# Patient Record
Sex: Male | Born: 1940 | Race: White | Hispanic: No | Smoking: Former smoker
Health system: Southern US, Community
[De-identification: ages and names within clinical notes are randomized; demographics above are authoritative.]

## PROBLEM LIST (undated history)

## (undated) ENCOUNTER — Other Ambulatory Visit: Admission: RE | Payer: Medicare Other | Source: Ambulatory Visit | Admitting: *Deleted

## (undated) DIAGNOSIS — B1910 Unspecified viral hepatitis B without hepatic coma: Secondary | ICD-10-CM

---

## 2019-12-31 ENCOUNTER — Inpatient Hospital Stay (HOSPITAL_COMMUNITY): Payer: Medicare Other

## 2019-12-31 ENCOUNTER — Inpatient Hospital Stay (HOSPITAL_COMMUNITY)
Admission: AD | Admit: 2019-12-31 | Discharge: 2020-01-08 | DRG: 219 | Disposition: A | Discharge status: Rehabilitation Facility | Payer: Medicare Other | Source: Other Acute Inpatient Hospital | Attending: Cardiothoracic Surgery | Admitting: Cardiothoracic Surgery

## 2019-12-31 ENCOUNTER — Encounter
Admission: EM | Disposition: A | Discharge status: Other Acute Inpatient Hospital | Payer: Self-pay | Source: Home / Self Care | Attending: Family Medicine

## 2019-12-31 ENCOUNTER — Inpatient Hospital Stay (HOSPITAL_COMMUNITY): Payer: Medicare Other | Admitting: Certified Registered Nurse Anesthetist

## 2019-12-31 ENCOUNTER — Other Ambulatory Visit: Payer: Self-pay

## 2019-12-31 ENCOUNTER — Inpatient Hospital Stay: Payer: Medicare Other

## 2019-12-31 ENCOUNTER — Inpatient Hospital Stay
Admission: EM | Admit: 2019-12-31 | Discharge: 2019-12-31 | DRG: 272 | Disposition: A | Discharge status: Other Acute Inpatient Hospital | Payer: Medicare Other | Attending: Family Medicine | Admitting: Family Medicine

## 2019-12-31 ENCOUNTER — Encounter (HOSPITAL_COMMUNITY): Payer: Self-pay | Admitting: Cardiology

## 2019-12-31 ENCOUNTER — Encounter (HOSPITAL_COMMUNITY)
Admission: AD | Disposition: A | Discharge status: Rehabilitation Facility | Payer: Self-pay | Attending: Cardiothoracic Surgery

## 2019-12-31 DIAGNOSIS — K5900 Constipation, unspecified: Secondary | ICD-10-CM | POA: Diagnosis present

## 2019-12-31 DIAGNOSIS — R0602 Shortness of breath: Secondary | ICD-10-CM | POA: Diagnosis not present

## 2019-12-31 DIAGNOSIS — I5022 Chronic systolic (congestive) heart failure: Secondary | ICD-10-CM | POA: Diagnosis not present

## 2019-12-31 DIAGNOSIS — R1310 Dysphagia, unspecified: Secondary | ICD-10-CM | POA: Diagnosis present

## 2019-12-31 DIAGNOSIS — I480 Paroxysmal atrial fibrillation: Secondary | ICD-10-CM | POA: Diagnosis present

## 2019-12-31 DIAGNOSIS — Z8249 Family history of ischemic heart disease and other diseases of the circulatory system: Secondary | ICD-10-CM | POA: Diagnosis not present

## 2019-12-31 DIAGNOSIS — I2101 ST elevation (STEMI) myocardial infarction involving left main coronary artery: Secondary | ICD-10-CM

## 2019-12-31 DIAGNOSIS — I959 Hypotension, unspecified: Secondary | ICD-10-CM | POA: Diagnosis present

## 2019-12-31 DIAGNOSIS — B1910 Unspecified viral hepatitis B without hepatic coma: Secondary | ICD-10-CM | POA: Diagnosis present

## 2019-12-31 DIAGNOSIS — E785 Hyperlipidemia, unspecified: Secondary | ICD-10-CM | POA: Diagnosis present

## 2019-12-31 DIAGNOSIS — E870 Hyperosmolality and hypernatremia: Secondary | ICD-10-CM | POA: Diagnosis not present

## 2019-12-31 DIAGNOSIS — N179 Acute kidney failure, unspecified: Secondary | ICD-10-CM | POA: Diagnosis present

## 2019-12-31 DIAGNOSIS — I2102 ST elevation (STEMI) myocardial infarction involving left anterior descending coronary artery: Secondary | ICD-10-CM | POA: Diagnosis present

## 2019-12-31 DIAGNOSIS — J9601 Acute respiratory failure with hypoxia: Secondary | ICD-10-CM | POA: Diagnosis present

## 2019-12-31 DIAGNOSIS — I5021 Acute systolic (congestive) heart failure: Secondary | ICD-10-CM | POA: Diagnosis present

## 2019-12-31 DIAGNOSIS — D6959 Other secondary thrombocytopenia: Secondary | ICD-10-CM | POA: Diagnosis not present

## 2019-12-31 DIAGNOSIS — Z20822 Contact with and (suspected) exposure to covid-19: Secondary | ICD-10-CM | POA: Diagnosis present

## 2019-12-31 DIAGNOSIS — J189 Pneumonia, unspecified organism: Secondary | ICD-10-CM | POA: Diagnosis not present

## 2019-12-31 DIAGNOSIS — I350 Nonrheumatic aortic (valve) stenosis: Secondary | ICD-10-CM | POA: Diagnosis present

## 2019-12-31 DIAGNOSIS — Z452 Encounter for adjustment and management of vascular access device: Secondary | ICD-10-CM

## 2019-12-31 DIAGNOSIS — Z01810 Encounter for preprocedural cardiovascular examination: Secondary | ICD-10-CM | POA: Diagnosis not present

## 2019-12-31 DIAGNOSIS — I340 Nonrheumatic mitral (valve) insufficiency: Secondary | ICD-10-CM

## 2019-12-31 DIAGNOSIS — Z952 Presence of prosthetic heart valve: Secondary | ICD-10-CM

## 2019-12-31 DIAGNOSIS — Z9889 Other specified postprocedural states: Secondary | ICD-10-CM

## 2019-12-31 DIAGNOSIS — I4891 Unspecified atrial fibrillation: Secondary | ICD-10-CM | POA: Diagnosis present

## 2019-12-31 DIAGNOSIS — D62 Acute posthemorrhagic anemia: Secondary | ICD-10-CM | POA: Diagnosis not present

## 2019-12-31 DIAGNOSIS — D649 Anemia, unspecified: Secondary | ICD-10-CM | POA: Diagnosis not present

## 2019-12-31 DIAGNOSIS — D72829 Elevated white blood cell count, unspecified: Secondary | ICD-10-CM | POA: Diagnosis not present

## 2019-12-31 DIAGNOSIS — N289 Disorder of kidney and ureter, unspecified: Secondary | ICD-10-CM | POA: Diagnosis present

## 2019-12-31 DIAGNOSIS — R64 Cachexia: Secondary | ICD-10-CM | POA: Diagnosis not present

## 2019-12-31 DIAGNOSIS — I5043 Acute on chronic combined systolic (congestive) and diastolic (congestive) heart failure: Secondary | ICD-10-CM | POA: Diagnosis not present

## 2019-12-31 DIAGNOSIS — I213 ST elevation (STEMI) myocardial infarction of unspecified site: Secondary | ICD-10-CM | POA: Diagnosis not present

## 2019-12-31 DIAGNOSIS — F22 Delusional disorders: Secondary | ICD-10-CM | POA: Diagnosis present

## 2019-12-31 DIAGNOSIS — I2510 Atherosclerotic heart disease of native coronary artery without angina pectoris: Secondary | ICD-10-CM | POA: Diagnosis present

## 2019-12-31 DIAGNOSIS — Z951 Presence of aortocoronary bypass graft: Secondary | ICD-10-CM | POA: Diagnosis not present

## 2019-12-31 DIAGNOSIS — I472 Ventricular tachycardia: Secondary | ICD-10-CM | POA: Diagnosis not present

## 2019-12-31 DIAGNOSIS — R5381 Other malaise: Secondary | ICD-10-CM | POA: Diagnosis not present

## 2019-12-31 DIAGNOSIS — R570 Cardiogenic shock: Secondary | ICD-10-CM | POA: Diagnosis present

## 2019-12-31 DIAGNOSIS — Z7982 Long term (current) use of aspirin: Secondary | ICD-10-CM

## 2019-12-31 DIAGNOSIS — I313 Pericardial effusion (noninflammatory): Secondary | ICD-10-CM | POA: Diagnosis present

## 2019-12-31 DIAGNOSIS — E162 Hypoglycemia, unspecified: Secondary | ICD-10-CM | POA: Diagnosis present

## 2019-12-31 DIAGNOSIS — Z681 Body mass index (BMI) 19 or less, adult: Secondary | ICD-10-CM | POA: Diagnosis not present

## 2019-12-31 DIAGNOSIS — I509 Heart failure, unspecified: Secondary | ICD-10-CM

## 2019-12-31 DIAGNOSIS — Z79899 Other long term (current) drug therapy: Secondary | ICD-10-CM | POA: Diagnosis not present

## 2019-12-31 DIAGNOSIS — Z9689 Presence of other specified functional implants: Secondary | ICD-10-CM

## 2019-12-31 DIAGNOSIS — I5023 Acute on chronic systolic (congestive) heart failure: Secondary | ICD-10-CM | POA: Diagnosis not present

## 2019-12-31 HISTORY — PX: CORONARY ARTERY BYPASS GRAFT: SHX141

## 2019-12-31 HISTORY — DX: Unspecified viral hepatitis B without hepatic coma: B19.10

## 2019-12-31 HISTORY — PX: CLIPPING OF ATRIAL APPENDAGE: SHX5773

## 2019-12-31 HISTORY — PX: AORTIC VALVE REPLACEMENT: SHX41

## 2019-12-31 HISTORY — PX: CORONARY/GRAFT ACUTE MI REVASCULARIZATION: CATH118305

## 2019-12-31 HISTORY — PX: MAZE: SHX5063

## 2019-12-31 HISTORY — PX: TEE WITHOUT CARDIOVERSION: SHX5443

## 2019-12-31 HISTORY — PX: LEFT HEART CATH AND CORONARY ANGIOGRAPHY: CATH118249

## 2019-12-31 LAB — POCT I-STAT 7, (LYTES, BLD GAS, ICA,H+H)
Acid-base deficit: 2 mmol/L (ref 0.0–2.0)
Acid-base deficit: 4 mmol/L — ABNORMAL HIGH (ref 0.0–2.0)
Acid-base deficit: 7 mmol/L — ABNORMAL HIGH (ref 0.0–2.0)
Acid-base deficit: 9 mmol/L — ABNORMAL HIGH (ref 0.0–2.0)
Acid-base deficit: 7 mmol/L — ABNORMAL HIGH (ref 0.0–2.0)
Acid-base deficit: 9 mmol/L — ABNORMAL HIGH (ref 0.0–2.0)
Acid-base deficit: 9 mmol/L — ABNORMAL HIGH (ref 0.0–2.0)
Acid-base deficit: 7 mmol/L — ABNORMAL HIGH (ref 0.0–2.0)
Bicarbonate: 21.9 mmol/L (ref 20.0–28.0)
Bicarbonate: 22.3 mmol/L (ref 20.0–28.0)
Bicarbonate: 19.4 mmol/L — ABNORMAL LOW (ref 20.0–28.0)
Bicarbonate: 18.8 mmol/L — ABNORMAL LOW (ref 20.0–28.0)
Bicarbonate: 18.4 mmol/L — ABNORMAL LOW (ref 20.0–28.0)
Bicarbonate: 20.5 mmol/L (ref 20.0–28.0)
Bicarbonate: 17.2 mmol/L — ABNORMAL LOW (ref 20.0–28.0)
Bicarbonate: 18.7 mmol/L — ABNORMAL LOW (ref 20.0–28.0)
Calcium, Ion: 1.16 mmol/L (ref 1.15–1.40)
Calcium, Ion: 1.01 mmol/L — ABNORMAL LOW (ref 1.15–1.40)
Calcium, Ion: 0.67 mmol/L — CL (ref 1.15–1.40)
Calcium, Ion: 0.9 mmol/L — ABNORMAL LOW (ref 1.15–1.40)
Calcium, Ion: 1.31 mmol/L (ref 1.15–1.40)
Calcium, Ion: 1.45 mmol/L — ABNORMAL HIGH (ref 1.15–1.40)
Calcium, Ion: 1.21 mmol/L (ref 1.15–1.40)
Calcium, Ion: 1.26 mmol/L (ref 1.15–1.40)
HCT: 35 % — ABNORMAL LOW (ref 39.0–52.0)
HCT: 22 % — ABNORMAL LOW (ref 39.0–52.0)
HCT: 31 % — ABNORMAL LOW (ref 39.0–52.0)
HCT: 29 % — ABNORMAL LOW (ref 39.0–52.0)
HCT: 30 % — ABNORMAL LOW (ref 39.0–52.0)
HCT: 30 % — ABNORMAL LOW (ref 39.0–52.0)
HCT: 27 % — ABNORMAL LOW (ref 39.0–52.0)
HCT: 26 % — ABNORMAL LOW (ref 39.0–52.0)
Hemoglobin: 11.9 g/dL — ABNORMAL LOW (ref 13.0–17.0)
Hemoglobin: 7.5 g/dL — ABNORMAL LOW (ref 13.0–17.0)
Hemoglobin: 10.5 g/dL — ABNORMAL LOW (ref 13.0–17.0)
Hemoglobin: 9.9 g/dL — ABNORMAL LOW (ref 13.0–17.0)
Hemoglobin: 10.2 g/dL — ABNORMAL LOW (ref 13.0–17.0)
Hemoglobin: 10.2 g/dL — ABNORMAL LOW (ref 13.0–17.0)
Hemoglobin: 9.2 g/dL — ABNORMAL LOW (ref 13.0–17.0)
Hemoglobin: 8.8 g/dL — ABNORMAL LOW (ref 13.0–17.0)
O2 Saturation: 94 %
O2 Saturation: 100 %
O2 Saturation: 99 %
O2 Saturation: 95 %
O2 Saturation: 86 %
O2 Saturation: 90 %
O2 Saturation: 92 %
O2 Saturation: 95 %
Patient temperature: 98.2
Patient temperature: 36.5
Patient temperature: 36.7
Patient temperature: 35.7
Patient temperature: 35.8
Potassium: 4 mmol/L (ref 3.5–5.1)
Potassium: 4.7 mmol/L (ref 3.5–5.1)
Potassium: 5.5 mmol/L — ABNORMAL HIGH (ref 3.5–5.1)
Potassium: 4.3 mmol/L (ref 3.5–5.1)
Potassium: 3.9 mmol/L (ref 3.5–5.1)
Potassium: 3.9 mmol/L (ref 3.5–5.1)
Potassium: 3.6 mmol/L (ref 3.5–5.1)
Potassium: 3.1 mmol/L — ABNORMAL LOW (ref 3.5–5.1)
Sodium: 141 mmol/L (ref 135–145)
Sodium: 140 mmol/L (ref 135–145)
Sodium: 143 mmol/L (ref 135–145)
Sodium: 145 mmol/L (ref 135–145)
Sodium: 144 mmol/L (ref 135–145)
Sodium: 145 mmol/L (ref 135–145)
Sodium: 146 mmol/L — ABNORMAL HIGH (ref 135–145)
Sodium: 148 mmol/L — ABNORMAL HIGH (ref 135–145)
TCO2: 23 mmol/L (ref 22–32)
TCO2: 24 mmol/L (ref 22–32)
TCO2: 21 mmol/L — ABNORMAL LOW (ref 22–32)
TCO2: 20 mmol/L — ABNORMAL LOW (ref 22–32)
TCO2: 20 mmol/L — ABNORMAL LOW (ref 22–32)
TCO2: 22 mmol/L (ref 22–32)
TCO2: 18 mmol/L — ABNORMAL LOW (ref 22–32)
TCO2: 20 mmol/L — ABNORMAL LOW (ref 22–32)
pCO2 arterial: 33.1 mmHg (ref 32.0–48.0)
pCO2 arterial: 46.2 mmHg (ref 32.0–48.0)
pCO2 arterial: 41.6 mmHg (ref 32.0–48.0)
pCO2 arterial: 46.7 mmHg (ref 32.0–48.0)
pCO2 arterial: 46.2 mmHg (ref 32.0–48.0)
pCO2 arterial: 48.2 mmHg — ABNORMAL HIGH (ref 32.0–48.0)
pCO2 arterial: 33.9 mmHg (ref 32.0–48.0)
pCO2 arterial: 35.6 mmHg (ref 32.0–48.0)
pH, Arterial: 7.428 (ref 7.350–7.450)
pH, Arterial: 7.292 — ABNORMAL LOW (ref 7.350–7.450)
pH, Arterial: 7.276 — ABNORMAL LOW (ref 7.350–7.450)
pH, Arterial: 7.212 — ABNORMAL LOW (ref 7.350–7.450)
pH, Arterial: 7.206 — ABNORMAL LOW (ref 7.350–7.450)
pH, Arterial: 7.234 — ABNORMAL LOW (ref 7.350–7.450)
pH, Arterial: 7.306 — ABNORMAL LOW (ref 7.350–7.450)
pH, Arterial: 7.322 — ABNORMAL LOW (ref 7.350–7.450)
pO2, Arterial: 69 mmHg — ABNORMAL LOW (ref 83.0–108.0)
pO2, Arterial: 407 mmHg — ABNORMAL HIGH (ref 83.0–108.0)
pO2, Arterial: 139 mmHg — ABNORMAL HIGH (ref 83.0–108.0)
pO2, Arterial: 90 mmHg (ref 83.0–108.0)
pO2, Arterial: 59 mmHg — ABNORMAL LOW (ref 83.0–108.0)
pO2, Arterial: 70 mmHg — ABNORMAL LOW (ref 83.0–108.0)
pO2, Arterial: 65 mmHg — ABNORMAL LOW (ref 83.0–108.0)
pO2, Arterial: 77 mmHg — ABNORMAL LOW (ref 83.0–108.0)

## 2019-12-31 LAB — CBC WITH DIFFERENTIAL/PLATELET
Abs Immature Granulocytes: 0.05 10*3/uL (ref 0.00–0.07)
Abs Immature Granulocytes: 0.11 10*3/uL — ABNORMAL HIGH (ref 0.00–0.07)
Basophils Absolute: 0.1 10*3/uL (ref 0.0–0.1)
Basophils Absolute: 0.2 10*3/uL — ABNORMAL HIGH (ref 0.0–0.1)
Basophils Relative: 1 %
Basophils Relative: 1 %
Eosinophils Absolute: 0 10*3/uL (ref 0.0–0.5)
Eosinophils Absolute: 0.3 10*3/uL (ref 0.0–0.5)
Eosinophils Relative: 0 %
Eosinophils Relative: 2 %
HCT: 36.2 % — ABNORMAL LOW (ref 39.0–52.0)
HCT: 40.2 % (ref 39.0–52.0)
Hemoglobin: 12.3 g/dL — ABNORMAL LOW (ref 13.0–17.0)
Hemoglobin: 13.6 g/dL (ref 13.0–17.0)
Immature Granulocytes: 0 %
Immature Granulocytes: 1 %
Lymphocytes Relative: 29 %
Lymphocytes Relative: 6 %
Lymphs Abs: 1.1 10*3/uL (ref 0.7–4.0)
Lymphs Abs: 4.9 10*3/uL — ABNORMAL HIGH (ref 0.7–4.0)
MCH: 35.6 pg — ABNORMAL HIGH (ref 26.0–34.0)
MCH: 35.7 pg — ABNORMAL HIGH (ref 26.0–34.0)
MCHC: 33.8 g/dL (ref 30.0–36.0)
MCHC: 34 g/dL (ref 30.0–36.0)
MCV: 104.9 fL — ABNORMAL HIGH (ref 80.0–100.0)
MCV: 105.2 fL — ABNORMAL HIGH (ref 80.0–100.0)
Monocytes Absolute: 1.6 10*3/uL — ABNORMAL HIGH (ref 0.1–1.0)
Monocytes Absolute: 1.8 10*3/uL — ABNORMAL HIGH (ref 0.1–1.0)
Monocytes Relative: 11 %
Monocytes Relative: 8 %
Neutro Abs: 10 10*3/uL — ABNORMAL HIGH (ref 1.7–7.7)
Neutro Abs: 17.1 10*3/uL — ABNORMAL HIGH (ref 1.7–7.7)
Neutrophils Relative %: 57 %
Neutrophils Relative %: 84 %
Platelets: 233 10*3/uL (ref 150–400)
Platelets: 274 10*3/uL (ref 150–400)
RBC: 3.45 MIL/uL — ABNORMAL LOW (ref 4.22–5.81)
RBC: 3.82 MIL/uL — ABNORMAL LOW (ref 4.22–5.81)
RDW: 19.2 % — ABNORMAL HIGH (ref 11.5–15.5)
RDW: 19.4 % — ABNORMAL HIGH (ref 11.5–15.5)
WBC: 17.2 10*3/uL — ABNORMAL HIGH (ref 4.0–10.5)
WBC: 20 10*3/uL — ABNORMAL HIGH (ref 4.0–10.5)
nRBC: 0.3 % — ABNORMAL HIGH (ref 0.0–0.2)
nRBC: 0.4 % — ABNORMAL HIGH (ref 0.0–0.2)

## 2019-12-31 LAB — COMPREHENSIVE METABOLIC PANEL
ALT: 28 U/L (ref 0–44)
AST: 41 U/L (ref 15–41)
Albumin: 3.9 g/dL (ref 3.5–5.0)
Alkaline Phosphatase: 65 U/L (ref 38–126)
Anion gap: 14 (ref 5–15)
BUN: 29 mg/dL — ABNORMAL HIGH (ref 8–23)
CO2: 22 mmol/L (ref 22–32)
Calcium: 9.1 mg/dL (ref 8.9–10.3)
Chloride: 103 mmol/L (ref 98–111)
Creatinine, Ser: 1.18 mg/dL (ref 0.61–1.24)
GFR calc Af Amer: >60 mL/min (ref >60)
GFR calc non Af Amer: 58 mL/min — ABNORMAL LOW (ref >60)
Glucose, Bld: 231 mg/dL — ABNORMAL HIGH (ref 70–99)
Potassium: 4.2 mmol/L (ref 3.5–5.1)
Sodium: 139 mmol/L (ref 135–145)
Total Bilirubin: 2 mg/dL — ABNORMAL HIGH (ref 0.3–1.2)
Total Protein: 7.7 g/dL (ref 6.5–8.1)

## 2019-12-31 LAB — POCT I-STAT, CHEM 8
BUN: 25 mg/dL — ABNORMAL HIGH (ref 8–23)
BUN: 21 mg/dL (ref 8–23)
BUN: 21 mg/dL (ref 8–23)
BUN: 20 mg/dL (ref 8–23)
BUN: 20 mg/dL (ref 8–23)
Calcium, Ion: 1.15 mmol/L (ref 1.15–1.40)
Calcium, Ion: 1.08 mmol/L — ABNORMAL LOW (ref 1.15–1.40)
Calcium, Ion: 1.03 mmol/L — ABNORMAL LOW (ref 1.15–1.40)
Calcium, Ion: 1.01 mmol/L — ABNORMAL LOW (ref 1.15–1.40)
Calcium, Ion: 0.66 mmol/L — CL (ref 1.15–1.40)
Chloride: 107 mmol/L (ref 98–111)
Chloride: 109 mmol/L (ref 98–111)
Chloride: 107 mmol/L (ref 98–111)
Chloride: 107 mmol/L (ref 98–111)
Chloride: 107 mmol/L (ref 98–111)
Creatinine, Ser: 1 mg/dL (ref 0.61–1.24)
Creatinine, Ser: 0.8 mg/dL (ref 0.61–1.24)
Creatinine, Ser: 0.8 mg/dL (ref 0.61–1.24)
Creatinine, Ser: 0.7 mg/dL (ref 0.61–1.24)
Creatinine, Ser: 0.8 mg/dL (ref 0.61–1.24)
Glucose, Bld: 130 mg/dL — ABNORMAL HIGH (ref 70–99)
Glucose, Bld: 158 mg/dL — ABNORMAL HIGH (ref 70–99)
Glucose, Bld: 148 mg/dL — ABNORMAL HIGH (ref 70–99)
Glucose, Bld: 157 mg/dL — ABNORMAL HIGH (ref 70–99)
Glucose, Bld: 164 mg/dL — ABNORMAL HIGH (ref 70–99)
HCT: 30 % — ABNORMAL LOW (ref 39.0–52.0)
HCT: 26 % — ABNORMAL LOW (ref 39.0–52.0)
HCT: 21 % — ABNORMAL LOW (ref 39.0–52.0)
HCT: 22 % — ABNORMAL LOW (ref 39.0–52.0)
HCT: 29 % — ABNORMAL LOW (ref 39.0–52.0)
Hemoglobin: 10.2 g/dL — ABNORMAL LOW (ref 13.0–17.0)
Hemoglobin: 8.8 g/dL — ABNORMAL LOW (ref 13.0–17.0)
Hemoglobin: 7.1 g/dL — ABNORMAL LOW (ref 13.0–17.0)
Hemoglobin: 7.5 g/dL — ABNORMAL LOW (ref 13.0–17.0)
Hemoglobin: 9.9 g/dL — ABNORMAL LOW (ref 13.0–17.0)
Potassium: 4.1 mmol/L (ref 3.5–5.1)
Potassium: 4.2 mmol/L (ref 3.5–5.1)
Potassium: 5.7 mmol/L — ABNORMAL HIGH (ref 3.5–5.1)
Potassium: 6.2 mmol/L — ABNORMAL HIGH (ref 3.5–5.1)
Potassium: 5.4 mmol/L — ABNORMAL HIGH (ref 3.5–5.1)
Sodium: 141 mmol/L (ref 135–145)
Sodium: 140 mmol/L (ref 135–145)
Sodium: 138 mmol/L (ref 135–145)
Sodium: 141 mmol/L (ref 135–145)
Sodium: 141 mmol/L (ref 135–145)
TCO2: 25 mmol/L (ref 22–32)
TCO2: 23 mmol/L (ref 22–32)
TCO2: 25 mmol/L (ref 22–32)
TCO2: 25 mmol/L (ref 22–32)
TCO2: 22 mmol/L (ref 22–32)

## 2019-12-31 LAB — HEMOGLOBIN AND HEMATOCRIT, BLOOD
HCT: 21.4 % — ABNORMAL LOW (ref 39.0–52.0)
Hemoglobin: 7.4 g/dL — ABNORMAL LOW (ref 13.0–17.0)

## 2019-12-31 LAB — URINALYSIS, ROUTINE W REFLEX MICROSCOPIC
Bilirubin Urine: NEGATIVE
Glucose, UA: NEGATIVE mg/dL
Hgb urine dipstick: NEGATIVE
Ketones, ur: 5 mg/dL — AB
Leukocytes,Ua: NEGATIVE
Nitrite: NEGATIVE
Protein, ur: NEGATIVE mg/dL
Specific Gravity, Urine: 1.043 — ABNORMAL HIGH (ref 1.005–1.030)
pH: 5 (ref 5.0–8.0)

## 2019-12-31 LAB — BASIC METABOLIC PANEL
Anion gap: 9 (ref 5–15)
BUN: 27 mg/dL — ABNORMAL HIGH (ref 8–23)
CO2: 23 mmol/L (ref 22–32)
Calcium: 8.5 mg/dL — ABNORMAL LOW (ref 8.9–10.3)
Chloride: 106 mmol/L (ref 98–111)
Creatinine, Ser: 1.13 mg/dL (ref 0.61–1.24)
GFR calc Af Amer: >60 mL/min (ref >60)
GFR calc non Af Amer: >60 mL/min (ref >60)
Glucose, Bld: 190 mg/dL — ABNORMAL HIGH (ref 70–99)
Potassium: 4 mmol/L (ref 3.5–5.1)
Sodium: 138 mmol/L (ref 135–145)

## 2019-12-31 LAB — LIPID PANEL
Cholesterol: 275 mg/dL — ABNORMAL HIGH (ref 0–200)
HDL: 75 mg/dL (ref >40)
LDL Cholesterol: 187 mg/dL — ABNORMAL HIGH (ref 0–99)
Total CHOL/HDL Ratio: 3.7 RATIO
Triglycerides: 65 mg/dL (ref <150)
VLDL: 13 mg/dL (ref 0–40)

## 2019-12-31 LAB — COOXEMETRY PANEL
Carboxyhemoglobin: 1 % (ref 0.5–1.5)
Methemoglobin: 0.6 % (ref 0.0–1.5)
O2 Saturation: 36.2 %
Total hemoglobin: 11.6 g/dL — ABNORMAL LOW (ref 12.0–16.0)

## 2019-12-31 LAB — ABO/RH: ABO/RH(D): O NEG

## 2019-12-31 LAB — CBC
HCT: 29.4 % — ABNORMAL LOW (ref 39.0–52.0)
Hemoglobin: 10 g/dL — ABNORMAL LOW (ref 13.0–17.0)
MCH: 34.8 pg — ABNORMAL HIGH (ref 26.0–34.0)
MCHC: 34 g/dL (ref 30.0–36.0)
MCV: 102.4 fL — ABNORMAL HIGH (ref 80.0–100.0)
Platelets: 78 10*3/uL — ABNORMAL LOW (ref 150–400)
RBC: 2.87 MIL/uL — ABNORMAL LOW (ref 4.22–5.81)
RDW: 18.9 % — ABNORMAL HIGH (ref 11.5–15.5)
WBC: 22.3 10*3/uL — ABNORMAL HIGH (ref 4.0–10.5)
nRBC: 0.8 % — ABNORMAL HIGH (ref 0.0–0.2)

## 2019-12-31 LAB — GLUCOSE, CAPILLARY
Glucose-Capillary: 170 mg/dL — ABNORMAL HIGH (ref 70–99)
Glucose-Capillary: 191 mg/dL — ABNORMAL HIGH (ref 70–99)
Glucose-Capillary: 140 mg/dL — ABNORMAL HIGH (ref 70–99)
Glucose-Capillary: 167 mg/dL — ABNORMAL HIGH (ref 70–99)
Glucose-Capillary: 199 mg/dL — ABNORMAL HIGH (ref 70–99)

## 2019-12-31 LAB — PREPARE RBC (CROSSMATCH)

## 2019-12-31 LAB — HEMOGLOBIN A1C
Hgb A1c MFr Bld: 5.2 % (ref 4.8–5.6)
Mean Plasma Glucose: 102.54 mg/dL

## 2019-12-31 LAB — TROPONIN I (HIGH SENSITIVITY)
Troponin I (High Sensitivity): 145 ng/L (ref <18)
Troponin I (High Sensitivity): 8098 ng/L (ref <18)
Troponin I (High Sensitivity): >27000 ng/L (ref <18)

## 2019-12-31 LAB — PROTIME-INR
INR: 1.2 (ref 0.8–1.2)
INR: 2.2 — ABNORMAL HIGH (ref 0.8–1.2)
Prothrombin Time: 14.8 seconds (ref 11.4–15.2)
Prothrombin Time: 24.4 seconds — ABNORMAL HIGH (ref 11.4–15.2)

## 2019-12-31 LAB — PLATELET COUNT: Platelets: 87 10*3/uL — ABNORMAL LOW (ref 150–400)

## 2019-12-31 LAB — ECHOCARDIOGRAM COMPLETE
Height: 67 in
Weight: 1920 oz

## 2019-12-31 LAB — RESPIRATORY PANEL BY RT PCR (FLU A&B, COVID)
Influenza A by PCR: NEGATIVE
Influenza B by PCR: NEGATIVE
SARS Coronavirus 2 by RT PCR: NEGATIVE

## 2019-12-31 LAB — APTT
aPTT: 29 seconds (ref 24–36)
aPTT: 65 seconds — ABNORMAL HIGH (ref 24–36)

## 2019-12-31 LAB — MRSA PCR SCREENING: MRSA by PCR: NEGATIVE

## 2019-12-31 LAB — MAGNESIUM: Magnesium: 2 mg/dL (ref 1.7–2.4)

## 2019-12-31 LAB — SURGICAL PCR SCREEN
MRSA, PCR: NEGATIVE
Staphylococcus aureus: NEGATIVE

## 2019-12-31 LAB — TSH: TSH: 2.539 u[IU]/mL (ref 0.350–4.500)

## 2019-12-31 SURGERY — CORONARY/GRAFT ACUTE MI REVASCULARIZATION
Anesthesia: Moderate Sedation

## 2019-12-31 SURGERY — CORONARY ARTERY BYPASS GRAFTING (CABG)
Anesthesia: General | Site: Chest

## 2019-12-31 MED ORDER — AMIODARONE HCL IN DEXTROSE 360-4.14 MG/200ML-% IV SOLN: 30.0000 mg/h | INTRAVENOUS | Status: DC

## 2019-12-31 MED ORDER — BUPIVACAINE LIPOSOME 1.3 % IJ SUSP: INTRAMUSCULAR | Status: DC | PRN

## 2019-12-31 MED ORDER — PHENYLEPHRINE 40 MCG/ML (10ML) SYRINGE FOR IV PUSH (FOR BLOOD PRESSURE SUPPORT)
PREFILLED_SYRINGE | INTRAVENOUS | Status: AC
  Filled 2019-12-31: qty 10

## 2019-12-31 MED ORDER — AMIODARONE HCL IN DEXTROSE 360-4.14 MG/200ML-% IV SOLN: 60.0000 mg/h | INTRAVENOUS | Status: DC

## 2019-12-31 MED ORDER — IOHEXOL 300 MG/ML  SOLN
INTRAMUSCULAR | Status: DC | PRN
  Administered 2019-12-31: 40 mL

## 2019-12-31 MED ORDER — ONDANSETRON HCL 4 MG/2ML IJ SOLN
4.0000 mg | Freq: Four times a day (QID) | INTRAMUSCULAR | Status: DC | PRN
  Administered 2020-01-01: 4 mg via INTRAVENOUS
  Filled 2019-12-31: qty 2

## 2019-12-31 MED ORDER — VASOPRESSIN 20 UNIT/ML IV SOLN
INTRAVENOUS | Status: DC | PRN
  Administered 2019-12-31 (×3): 1 m[IU] via INTRAVENOUS

## 2019-12-31 MED ORDER — ASPIRIN 81 MG PO CHEW: 324.0000 mg | CHEWABLE_TABLET | ORAL | Status: DC

## 2019-12-31 MED ORDER — NITROGLYCERIN 0.4 MG SL SUBL: 0.4000 mg | SUBLINGUAL_TABLET | SUBLINGUAL | Status: DC | PRN

## 2019-12-31 MED ORDER — SODIUM CHLORIDE 0.9 % IV SOLN: 250.0000 mL | INTRAVENOUS | Status: DC

## 2019-12-31 MED ORDER — DOCUSATE SODIUM 100 MG PO CAPS
200.0000 mg | ORAL_CAPSULE | Freq: Every day | ORAL | Status: DC
  Filled 2019-12-31: qty 2

## 2019-12-31 MED ORDER — ACETAMINOPHEN 500 MG PO TABS
1000.0000 mg | ORAL_TABLET | Freq: Four times a day (QID) | ORAL | Status: DC
  Administered 2020-01-02 – 2020-01-03 (×2): 1000 mg via ORAL
  Filled 2019-12-31 (×4): qty 2

## 2019-12-31 MED ORDER — PROTAMINE SULFATE 10 MG/ML IV SOLN
INTRAVENOUS | Status: AC
  Filled 2019-12-31: qty 25

## 2019-12-31 MED ORDER — HEMOSTATIC AGENTS (NO CHARGE) OPTIME
TOPICAL | Status: DC | PRN
  Administered 2019-12-31: 2 via TOPICAL

## 2019-12-31 MED ORDER — LABETALOL HCL 5 MG/ML IV SOLN: 10.0000 mg | INTRAVENOUS | Status: DC | PRN

## 2019-12-31 MED ORDER — DEXTROSE 50 % IV SOLN: 0.0000 mL | INTRAVENOUS | Status: DC | PRN

## 2019-12-31 MED ORDER — TRANEXAMIC ACID 1000 MG/10ML IV SOLN
1.5000 mg/kg/h | INTRAVENOUS | Status: AC
  Administered 2019-12-31: 1.5 mg/kg/h via INTRAVENOUS
  Filled 2019-12-31: qty 25

## 2019-12-31 MED ORDER — CHLORHEXIDINE GLUCONATE CLOTH 2 % EX PADS: 6.0000 | MEDICATED_PAD | Freq: Every day | CUTANEOUS | Status: DC

## 2019-12-31 MED ORDER — NITROGLYCERIN 1 MG/10 ML FOR IR/CATH LAB
INTRA_ARTERIAL | Status: AC
  Filled 2019-12-31: qty 10

## 2019-12-31 MED ORDER — FENTANYL CITRATE (PF) 250 MCG/5ML IJ SOLN
INTRAMUSCULAR | Status: DC | PRN
  Administered 2019-12-31 (×2): 100 ug via INTRAVENOUS
  Administered 2019-12-31 (×2): 50 ug via INTRAVENOUS
  Administered 2019-12-31 (×2): 100 ug via INTRAVENOUS
  Administered 2019-12-31: 250 ug via INTRAVENOUS

## 2019-12-31 MED ORDER — ORAL CARE MOUTH RINSE: 15.0000 mL | Freq: Two times a day (BID) | OROMUCOSAL | Status: DC

## 2019-12-31 MED ORDER — SODIUM CHLORIDE 0.9% IV SOLUTION: Freq: Once | INTRAVENOUS | Status: DC

## 2019-12-31 MED ORDER — HEPARIN (PORCINE) 25000 UT/250ML-% IV SOLN
INTRAVENOUS | Status: AC
  Filled 2019-12-31: qty 250

## 2019-12-31 MED ORDER — ASPIRIN EC 81 MG PO TBEC: 81.0000 mg | DELAYED_RELEASE_TABLET | Freq: Every day | ORAL | Status: DC

## 2019-12-31 MED ORDER — NOREPINEPHRINE BITARTRATE 1 MG/ML IV SOLN
INTRAVENOUS | Status: AC
  Filled 2019-12-31: qty 4

## 2019-12-31 MED ORDER — SODIUM CHLORIDE 0.9% FLUSH: 3.0000 mL | INTRAVENOUS | Status: DC | PRN

## 2019-12-31 MED ORDER — BISACODYL 5 MG PO TBEC: 5.0000 mg | DELAYED_RELEASE_TABLET | Freq: Once | ORAL | Status: DC

## 2019-12-31 MED ORDER — ACETAMINOPHEN 325 MG PO TABS: 650.0000 mg | ORAL_TABLET | ORAL | Status: DC | PRN

## 2019-12-31 MED ORDER — SODIUM CHLORIDE 0.9 % IV SOLN: INTRAVENOUS | Status: DC

## 2019-12-31 MED ORDER — SODIUM BICARBONATE 8.4 % IV SOLN
INTRAVENOUS | Status: DC | PRN
  Administered 2019-12-31 (×3): 50 meq via INTRAVENOUS

## 2019-12-31 MED ORDER — STERILE WATER FOR INJECTION IJ SOLN
INTRAMUSCULAR | Status: AC
  Filled 2019-12-31: qty 10

## 2019-12-31 MED ORDER — MILRINONE LACTATE IN DEXTROSE 20-5 MG/100ML-% IV SOLN
0.3000 ug/kg/min | INTRAVENOUS | Status: AC
  Administered 2019-12-31: .25 ug/kg/min via INTRAVENOUS
  Filled 2019-12-31: qty 100

## 2019-12-31 MED ORDER — HEMOSTATIC AGENTS (NO CHARGE) OPTIME
TOPICAL | Status: DC | PRN
  Administered 2019-12-31: 1 via TOPICAL

## 2019-12-31 MED ORDER — METOPROLOL TARTRATE 12.5 MG HALF TABLET: 12.5000 mg | ORAL_TABLET | Freq: Once | ORAL | Status: DC

## 2019-12-31 MED ORDER — POTASSIUM CHLORIDE 2 MEQ/ML IV SOLN
80.0000 meq | INTRAVENOUS | Status: DC
  Filled 2019-12-31: qty 40

## 2019-12-31 MED ORDER — TRANEXAMIC ACID (OHS) PUMP PRIME SOLUTION
2.0000 mg/kg | INTRAVENOUS | Status: DC
  Filled 2019-12-31: qty 1.09

## 2019-12-31 MED ORDER — NON FORMULARY
Status: DC | PRN
  Administered 2019-12-31: .5 mL

## 2019-12-31 MED ORDER — SODIUM CHLORIDE 0.9 % IV SOLN
INTRAVENOUS | Status: DC
  Filled 2019-12-31: qty 30

## 2019-12-31 MED ORDER — PROTAMINE SULFATE 10 MG/ML IV SOLN
INTRAVENOUS | Status: DC | PRN
  Administered 2019-12-31: 190 mg via INTRAVENOUS
  Administered 2019-12-31: 10 mg via INTRAVENOUS

## 2019-12-31 MED ORDER — SODIUM CHLORIDE 0.9 % IV SOLN
1.5000 g | INTRAVENOUS | Status: AC
  Administered 2019-12-31: 1.5 g via INTRAVENOUS
  Administered 2019-12-31: .75 g via INTRAVENOUS
  Filled 2019-12-31: qty 1.5

## 2019-12-31 MED ORDER — METOPROLOL TARTRATE 12.5 MG HALF TABLET: 12.5000 mg | ORAL_TABLET | Freq: Two times a day (BID) | ORAL | Status: DC

## 2019-12-31 MED ORDER — SODIUM CHLORIDE 0.9% FLUSH: 10.0000 mL | INTRAVENOUS | Status: DC | PRN

## 2019-12-31 MED ORDER — ASPIRIN 81 MG PO CHEW: 324.0000 mg | CHEWABLE_TABLET | Freq: Every day | ORAL | Status: DC

## 2019-12-31 MED ORDER — CHLORHEXIDINE GLUCONATE 0.12 % MT SOLN
15.0000 mL | OROMUCOSAL | Status: AC
  Administered 2019-12-31: 15 mL via OROMUCOSAL

## 2019-12-31 MED ORDER — ROSUVASTATIN CALCIUM 20 MG PO TABS: 40.0000 mg | ORAL_TABLET | Freq: Every day | ORAL | Status: DC

## 2019-12-31 MED ORDER — ASPIRIN 300 MG RE SUPP: 300.0000 mg | RECTAL | Status: DC

## 2019-12-31 MED ORDER — POTASSIUM CHLORIDE 10 MEQ/50ML IV SOLN
10.0000 meq | INTRAVENOUS | Status: AC
  Administered 2019-12-31 (×2): 10 meq via INTRAVENOUS

## 2019-12-31 MED ORDER — STERILE WATER FOR INJECTION IJ SOLN
INTRAMUSCULAR | Status: DC | PRN
  Administered 2019-12-31: 1000 mL

## 2019-12-31 MED ORDER — ONDANSETRON HCL 4 MG/2ML IJ SOLN: 4.0000 mg | Freq: Four times a day (QID) | INTRAMUSCULAR | Status: DC | PRN

## 2019-12-31 MED ORDER — SODIUM CHLORIDE 0.9 % IV SOLN
1.5000 g | Freq: Two times a day (BID) | INTRAVENOUS | Status: AC
  Administered 2019-12-31 – 2020-01-02 (×4): 1.5 g via INTRAVENOUS
  Filled 2019-12-31 (×4): qty 1.5

## 2019-12-31 MED ORDER — MORPHINE SULFATE (PF) 2 MG/ML IV SOLN
1.0000 mg | INTRAVENOUS | Status: DC | PRN
  Administered 2020-01-01 (×3): 4 mg via INTRAVENOUS
  Filled 2019-12-31 (×3): qty 2

## 2019-12-31 MED ORDER — VANCOMYCIN HCL 1250 MG/250ML IV SOLN
1250.0000 mg | INTRAVENOUS | Status: AC
  Administered 2019-12-31: 1250 mg via INTRAVENOUS
  Filled 2019-12-31: qty 250

## 2019-12-31 MED ORDER — METOPROLOL TARTRATE 5 MG/5ML IV SOLN: 2.5000 mg | INTRAVENOUS | Status: DC | PRN

## 2019-12-31 MED ORDER — EPINEPHRINE PF 1 MG/ML IJ SOLN
0.0000 ug/min | INTRAVENOUS | Status: DC
  Administered 2019-12-31 – 2020-01-01 (×2): 5 ug/min via INTRAVENOUS
  Filled 2019-12-31 (×2): qty 4

## 2019-12-31 MED ORDER — HEPARIN (PORCINE) 25000 UT/250ML-% IV SOLN: 10.0000 [IU]/kg/h | INTRAVENOUS | Status: DC

## 2019-12-31 MED ORDER — SODIUM CHLORIDE 0.45 % IV SOLN: INTRAVENOUS | Status: DC | PRN

## 2019-12-31 MED ORDER — NOREPINEPHRINE 4 MG/250ML-% IV SOLN
0.0000 ug/min | INTRAVENOUS | Status: AC
  Administered 2019-12-31: 2 ug/min via INTRAVENOUS
  Filled 2019-12-31: qty 250

## 2019-12-31 MED ORDER — BISACODYL 10 MG RE SUPP
10.0000 mg | Freq: Every day | RECTAL | Status: DC
  Administered 2020-01-01 – 2020-01-04 (×2): 10 mg via RECTAL
  Filled 2019-12-31 (×2): qty 1

## 2019-12-31 MED ORDER — NOREPINEPHRINE 4 MG/250ML-% IV SOLN
0.0000 ug/min | INTRAVENOUS | Status: DC
  Administered 2019-12-31: 10 ug/min via INTRAVENOUS
  Administered 2020-01-01 – 2020-01-02 (×2): 8 ug/min via INTRAVENOUS
  Administered 2020-01-02: 14:00:00 7 ug/min via INTRAVENOUS
  Administered 2020-01-03: 10 ug/min via INTRAVENOUS
  Filled 2019-12-31 (×5): qty 250

## 2019-12-31 MED ORDER — MAGNESIUM SULFATE 4 GM/100ML IV SOLN
4.0000 g | Freq: Once | INTRAVENOUS | Status: AC
  Administered 2019-12-31: 4 g via INTRAVENOUS
  Filled 2019-12-31: qty 100

## 2019-12-31 MED ORDER — EPHEDRINE 5 MG/ML INJ
INTRAVENOUS | Status: AC
  Filled 2019-12-31: qty 10

## 2019-12-31 MED ORDER — HEPARIN (PORCINE) IN NACL 1000-0.9 UT/500ML-% IV SOLN
INTRAVENOUS | Status: AC
  Filled 2019-12-31: qty 1000

## 2019-12-31 MED ORDER — TICAGRELOR 90 MG PO TABS
ORAL_TABLET | ORAL | Status: AC
  Filled 2019-12-31: qty 2

## 2019-12-31 MED ORDER — BUPIVACAINE HCL (PF) 0.5 % IJ SOLN
INTRAMUSCULAR | Status: AC
  Filled 2019-12-31: qty 30

## 2019-12-31 MED ORDER — ROCURONIUM BROMIDE 10 MG/ML (PF) SYRINGE
PREFILLED_SYRINGE | INTRAVENOUS | Status: DC | PRN
  Administered 2019-12-31: 50 mg via INTRAVENOUS
  Administered 2019-12-31: 30 mg via INTRAVENOUS
  Administered 2019-12-31: 50 mg via INTRAVENOUS
  Administered 2019-12-31: 100 mg via INTRAVENOUS

## 2019-12-31 MED ORDER — ACETAMINOPHEN 160 MG/5ML PO SOLN
1000.0000 mg | Freq: Four times a day (QID) | ORAL | Status: DC
  Administered 2019-12-31 – 2020-01-01 (×3): 1000 mg
  Filled 2019-12-31 (×4): qty 40.6

## 2019-12-31 MED ORDER — CHLORHEXIDINE GLUCONATE CLOTH 2 % EX PADS
6.0000 | MEDICATED_PAD | Freq: Every day | CUTANEOUS | Status: DC
  Administered 2019-12-31 – 2020-01-07 (×7): 6 via TOPICAL

## 2019-12-31 MED ORDER — CALCIUM CHLORIDE 10 % IV SOLN
INTRAVENOUS | Status: DC | PRN
  Administered 2019-12-31: .1 g via INTRAVENOUS
  Administered 2019-12-31: .2 g via INTRAVENOUS
  Administered 2019-12-31: .5 g via INTRAVENOUS
  Administered 2019-12-31: .1 g via INTRAVENOUS
  Administered 2019-12-31 (×2): .5 g via INTRAVENOUS
  Administered 2019-12-31: .1 g via INTRAVENOUS

## 2019-12-31 MED ORDER — PLASMA-LYTE 148 IV SOLN
INTRAVENOUS | Status: DC
  Filled 2019-12-31: qty 2.5

## 2019-12-31 MED ORDER — PROPOFOL 10 MG/ML IV BOLUS
INTRAVENOUS | Status: DC | PRN
  Administered 2019-12-31: 30 mg via INTRAVENOUS

## 2019-12-31 MED ORDER — HEPARIN (PORCINE) 25000 UT/250ML-% IV SOLN: 700.0000 [IU]/h | INTRAVENOUS | Status: DC

## 2019-12-31 MED ORDER — EPINEPHRINE PF 1 MG/ML IJ SOLN
0.0000 ug/min | INTRAVENOUS | Status: AC
  Administered 2019-12-31: 2 ug/min via INTRAVENOUS
  Filled 2019-12-31: qty 4

## 2019-12-31 MED ORDER — NOREPINEPHRINE 4 MG/250ML-% IV SOLN: 0.0000 ug/min | INTRAVENOUS | Status: DC

## 2019-12-31 MED ORDER — TEMAZEPAM 15 MG PO CAPS: 15.0000 mg | ORAL_CAPSULE | Freq: Once | ORAL | Status: DC | PRN

## 2019-12-31 MED ORDER — LACTATED RINGERS IV SOLN: 500.0000 mL | Freq: Once | INTRAVENOUS | Status: DC | PRN

## 2019-12-31 MED ORDER — PROPOFOL 10 MG/ML IV BOLUS
INTRAVENOUS | Status: AC
  Filled 2019-12-31: qty 20

## 2019-12-31 MED ORDER — ROCURONIUM BROMIDE 10 MG/ML (PF) SYRINGE
PREFILLED_SYRINGE | INTRAVENOUS | Status: AC
  Filled 2019-12-31: qty 30

## 2019-12-31 MED ORDER — INSULIN REGULAR(HUMAN) IN NACL 100-0.9 UT/100ML-% IV SOLN
INTRAVENOUS | Status: DC
  Filled 2019-12-31: qty 100

## 2019-12-31 MED ORDER — HEPARIN SODIUM (PORCINE) 1000 UNIT/ML IJ SOLN
INTRAMUSCULAR | Status: DC | PRN
  Administered 2019-12-31: 2000 [IU] via INTRAVENOUS

## 2019-12-31 MED ORDER — PHENYLEPHRINE HCL (PRESSORS) 10 MG/ML IV SOLN
INTRAVENOUS | Status: DC | PRN
  Administered 2019-12-31 (×2): 120 ug via INTRAVENOUS

## 2019-12-31 MED ORDER — AMIODARONE HCL IN DEXTROSE 360-4.14 MG/200ML-% IV SOLN
60.0000 mg/h | INTRAVENOUS | Status: DC
  Administered 2019-12-31: 60 mg/h via INTRAVENOUS

## 2019-12-31 MED ORDER — SODIUM CHLORIDE 0.9% FLUSH
3.0000 mL | Freq: Two times a day (BID) | INTRAVENOUS | Status: DC
  Administered 2020-01-01 – 2020-01-08 (×7): 3 mL via INTRAVENOUS

## 2019-12-31 MED ORDER — MAGNESIUM SULFATE 50 % IJ SOLN
40.0000 meq | INTRAMUSCULAR | Status: DC
  Filled 2019-12-31: qty 9.85

## 2019-12-31 MED ORDER — HYDRALAZINE HCL 20 MG/ML IJ SOLN: 10.0000 mg | INTRAMUSCULAR | Status: DC | PRN

## 2019-12-31 MED ORDER — LACTATED RINGERS IV SOLN: INTRAVENOUS | Status: DC | PRN

## 2019-12-31 MED ORDER — MIDAZOLAM HCL 2 MG/2ML IJ SOLN
2.0000 mg | INTRAMUSCULAR | Status: DC | PRN
  Administered 2020-01-01 (×2): 2 mg via INTRAVENOUS
  Filled 2019-12-31 (×2): qty 2

## 2019-12-31 MED ORDER — SODIUM BICARBONATE 8.4 % IV SOLN
100.0000 meq | Freq: Once | INTRAVENOUS | Status: AC
  Administered 2019-12-31: 100 meq via INTRAVENOUS

## 2019-12-31 MED ORDER — BIVALIRUDIN TRIFLUOROACETATE 250 MG IV SOLR
INTRAVENOUS | Status: AC
  Filled 2019-12-31: qty 250

## 2019-12-31 MED ORDER — DOPAMINE-DEXTROSE 3.2-5 MG/ML-% IV SOLN
2.5000 ug/kg/min | INTRAVENOUS | Status: DC
  Administered 2019-12-31: 2.5 ug/kg/min via INTRAVENOUS
  Filled 2019-12-31 (×2): qty 250

## 2019-12-31 MED ORDER — MIDAZOLAM HCL 5 MG/5ML IJ SOLN
INTRAMUSCULAR | Status: DC | PRN
  Administered 2019-12-31: 3 mg via INTRAVENOUS
  Administered 2019-12-31 (×2): 2 mg via INTRAVENOUS

## 2019-12-31 MED ORDER — SODIUM CHLORIDE 0.9 % WEIGHT BASED INFUSION: 1.0000 mL/kg/h | INTRAVENOUS | Status: DC

## 2019-12-31 MED ORDER — CHLORHEXIDINE GLUCONATE 0.12 % MT SOLN: 15.0000 mL | Freq: Once | OROMUCOSAL | Status: DC

## 2019-12-31 MED ORDER — FENTANYL CITRATE (PF) 250 MCG/5ML IJ SOLN
INTRAMUSCULAR | Status: AC
  Filled 2019-12-31: qty 25

## 2019-12-31 MED ORDER — TRANEXAMIC ACID (OHS) BOLUS VIA INFUSION
15.0000 mg/kg | INTRAVENOUS | Status: AC
  Administered 2019-12-31: 816 mg via INTRAVENOUS
  Filled 2019-12-31: qty 816

## 2019-12-31 MED ORDER — ASPIRIN EC 325 MG PO TBEC: 325.0000 mg | DELAYED_RELEASE_TABLET | Freq: Every day | ORAL | Status: DC

## 2019-12-31 MED ORDER — VANCOMYCIN HCL IN DEXTROSE 1-5 GM/200ML-% IV SOLN
1000.0000 mg | Freq: Once | INTRAVENOUS | Status: AC
  Administered 2020-01-01: 1000 mg via INTRAVENOUS
  Filled 2019-12-31: qty 200

## 2019-12-31 MED ORDER — BISACODYL 5 MG PO TBEC
10.0000 mg | DELAYED_RELEASE_TABLET | Freq: Every day | ORAL | Status: DC
  Administered 2020-01-06: 10 mg via ORAL
  Filled 2019-12-31 (×2): qty 2

## 2019-12-31 MED ORDER — SODIUM CHLORIDE 0.9% FLUSH: 3.0000 mL | Freq: Two times a day (BID) | INTRAVENOUS | Status: DC

## 2019-12-31 MED ORDER — SODIUM CHLORIDE 0.9% FLUSH
10.0000 mL | Freq: Two times a day (BID) | INTRAVENOUS | Status: DC
  Administered 2019-12-31: 10 mL

## 2019-12-31 MED ORDER — PLASMA-LYTE 148 IV SOLN
INTRAVENOUS | Status: DC | PRN
  Administered 2019-12-31: 500 mL via INTRAVASCULAR

## 2019-12-31 MED ORDER — SODIUM CHLORIDE 0.9 % IV SOLN
750.0000 mg | INTRAVENOUS | Status: DC
  Filled 2019-12-31: qty 750

## 2019-12-31 MED ORDER — HEPARIN SODIUM (PORCINE) 5000 UNIT/ML IJ SOLN: 60.0000 [IU]/kg | Freq: Once | INTRAMUSCULAR | Status: DC

## 2019-12-31 MED ORDER — HEPARIN BOLUS VIA INFUSION
4000.0000 [IU] | Freq: Once | INTRAVENOUS | Status: AC
  Administered 2019-12-31: 01:00:00 4000 [IU] via INTRAVENOUS
  Filled 2019-12-31: qty 4000

## 2019-12-31 MED ORDER — VANCOMYCIN HCL 1000 MG IV SOLR
INTRAVENOUS | Status: AC
  Filled 2019-12-31: qty 3000

## 2019-12-31 MED ORDER — SODIUM BICARBONATE-DEXTROSE 150-5 MEQ/L-% IV SOLN
150.0000 meq | INTRAVENOUS | Status: DC
  Administered 2019-12-31: 150 meq via INTRAVENOUS
  Filled 2019-12-31 (×2): qty 1000

## 2019-12-31 MED ORDER — ALBUMIN HUMAN 5 % IV SOLN
250.0000 mL | INTRAVENOUS | Status: AC | PRN
  Administered 2020-01-01: 04:00:00 12.5 g via INTRAVENOUS

## 2019-12-31 MED ORDER — HEPARIN SODIUM (PORCINE) 1000 UNIT/ML IJ SOLN
INTRAMUSCULAR | Status: DC | PRN
  Administered 2019-12-31: 20000 [IU] via INTRAVENOUS

## 2019-12-31 MED ORDER — NOREPINEPHRINE 16 MG/250ML-% IV SOLN
0.0000 ug/min | INTRAVENOUS | Status: DC
  Filled 2019-12-31: qty 250

## 2019-12-31 MED ORDER — MILRINONE LACTATE IN DEXTROSE 20-5 MG/100ML-% IV SOLN
0.2500 ug/kg/min | INTRAVENOUS | Status: DC
  Administered 2019-12-31 – 2020-01-01 (×2): 0.25 ug/kg/min via INTRAVENOUS
  Filled 2019-12-31 (×2): qty 100

## 2019-12-31 MED ORDER — MIDAZOLAM HCL (PF) 10 MG/2ML IJ SOLN
INTRAMUSCULAR | Status: AC
  Filled 2019-12-31: qty 2

## 2019-12-31 MED ORDER — METOPROLOL TARTRATE 25 MG/10 ML ORAL SUSPENSION: 12.5000 mg | Freq: Two times a day (BID) | ORAL | Status: DC

## 2019-12-31 MED ORDER — PANTOPRAZOLE SODIUM 40 MG PO TBEC
40.0000 mg | DELAYED_RELEASE_TABLET | Freq: Every day | ORAL | Status: DC
  Administered 2020-01-02 – 2020-01-03 (×2): 40 mg via ORAL
  Filled 2019-12-31 (×2): qty 1

## 2019-12-31 MED ORDER — TRAMADOL HCL 50 MG PO TABS
50.0000 mg | ORAL_TABLET | ORAL | Status: DC | PRN
  Administered 2020-01-01: 100 mg via ORAL
  Filled 2019-12-31: qty 2

## 2019-12-31 MED ORDER — ACETAMINOPHEN 650 MG RE SUPP
650.0000 mg | Freq: Once | RECTAL | Status: AC
  Administered 2019-12-31: 650 mg via RECTAL

## 2019-12-31 MED ORDER — HEPARIN (PORCINE) IN NACL 1000-0.9 UT/500ML-% IV SOLN
INTRAVENOUS | Status: DC | PRN
  Administered 2019-12-31: 1000 mL

## 2019-12-31 MED ORDER — DEXMEDETOMIDINE HCL IN NACL 400 MCG/100ML IV SOLN
0.0000 ug/kg/h | INTRAVENOUS | Status: DC
  Administered 2019-12-31: 23:00:00 0.7 ug/kg/h via INTRAVENOUS
  Administered 2020-01-01: 0.8 ug/kg/h via INTRAVENOUS
  Filled 2019-12-31: qty 200

## 2019-12-31 MED ORDER — MIDAZOLAM HCL 2 MG/2ML IJ SOLN
INTRAMUSCULAR | Status: AC
  Filled 2019-12-31: qty 2

## 2019-12-31 MED ORDER — LACTATED RINGERS IV SOLN: INTRAVENOUS | Status: DC

## 2019-12-31 MED ORDER — VANCOMYCIN HCL 1000 MG IV SOLR
INTRAVENOUS | Status: DC | PRN
  Administered 2019-12-31: 3 g

## 2019-12-31 MED ORDER — SODIUM CHLORIDE 0.9 % IV SOLN
250.0000 mL | INTRAVENOUS | Status: DC | PRN
  Administered 2019-12-31: 250 mL via INTRAVENOUS

## 2019-12-31 MED ORDER — FENTANYL CITRATE (PF) 100 MCG/2ML IJ SOLN
INTRAMUSCULAR | Status: AC
  Filled 2019-12-31: qty 2

## 2019-12-31 MED ORDER — HEPARIN SODIUM (PORCINE) 1000 UNIT/ML IJ SOLN
INTRAMUSCULAR | Status: AC
  Filled 2019-12-31: qty 1

## 2019-12-31 MED ORDER — 0.9 % SODIUM CHLORIDE (POUR BTL) OPTIME
TOPICAL | Status: DC | PRN
  Administered 2019-12-31: 4000 mL

## 2019-12-31 MED ORDER — NITROGLYCERIN IN D5W 200-5 MCG/ML-% IV SOLN
2.0000 ug/min | INTRAVENOUS | Status: DC
  Filled 2019-12-31: qty 250

## 2019-12-31 MED ORDER — CHLORHEXIDINE GLUCONATE CLOTH 2 % EX PADS
6.0000 | MEDICATED_PAD | Freq: Once | CUTANEOUS | Status: AC
  Administered 2019-12-31: 6 via TOPICAL

## 2019-12-31 MED ORDER — ALBUMIN HUMAN 5 % IV SOLN: INTRAVENOUS | Status: DC | PRN

## 2019-12-31 MED ORDER — INSULIN REGULAR(HUMAN) IN NACL 100-0.9 UT/100ML-% IV SOLN
INTRAVENOUS | Status: AC
  Administered 2019-12-31: 1 [IU]/h via INTRAVENOUS
  Filled 2019-12-31: qty 100

## 2019-12-31 MED ORDER — AMIODARONE LOAD VIA INFUSION
150.0000 mg | Freq: Once | INTRAVENOUS | Status: AC
  Administered 2019-12-31: 150 mg via INTRAVENOUS
  Filled 2019-12-31: qty 83.34

## 2019-12-31 MED ORDER — CHLORHEXIDINE GLUCONATE 0.12 % MT SOLN
15.0000 mL | Freq: Two times a day (BID) | OROMUCOSAL | Status: DC
  Administered 2019-12-31: 15 mL via OROMUCOSAL

## 2019-12-31 MED ORDER — NOREPINEPHRINE BITARTRATE 1 MG/ML IV SOLN
INTRAVENOUS | Status: AC | PRN
  Administered 2019-12-31: 4 ug/min via INTRAVENOUS

## 2019-12-31 MED ORDER — AMIODARONE HCL IN DEXTROSE 360-4.14 MG/200ML-% IV SOLN
INTRAVENOUS | Status: AC
  Filled 2019-12-31: qty 200

## 2019-12-31 MED ORDER — OXYCODONE HCL 5 MG PO TABS: 5.0000 mg | ORAL_TABLET | ORAL | Status: DC | PRN

## 2019-12-31 MED ORDER — VANCOMYCIN HCL 1000 MG IV SOLR
INTRAVENOUS | Status: DC
  Filled 2019-12-31: qty 1000

## 2019-12-31 MED ORDER — CHLORHEXIDINE GLUCONATE CLOTH 2 % EX PADS: 6.0000 | MEDICATED_PAD | Freq: Once | CUTANEOUS | Status: DC

## 2019-12-31 MED ORDER — DEXMEDETOMIDINE HCL IN NACL 400 MCG/100ML IV SOLN
0.1000 ug/kg/h | INTRAVENOUS | Status: AC
  Administered 2019-12-31: .3 ug/kg/h via INTRAVENOUS
  Filled 2019-12-31: qty 100

## 2019-12-31 MED ORDER — VITAMIN K1 10 MG/ML IJ SOLN
1.0000 mg | Freq: Once | INTRAVENOUS | Status: AC
  Administered 2019-12-31: 21:00:00 1 mg via INTRAVENOUS
  Filled 2019-12-31: qty 0.1

## 2019-12-31 MED ORDER — SODIUM BICARBONATE 8.4 % IV SOLN
INTRAVENOUS | Status: AC
  Filled 2019-12-31: qty 100

## 2019-12-31 MED ORDER — ATORVASTATIN CALCIUM 80 MG PO TABS: 80.0000 mg | ORAL_TABLET | Freq: Every day | ORAL | Status: DC

## 2019-12-31 MED ORDER — HEPARIN (PORCINE) 25000 UT/250ML-% IV SOLN
700.0000 [IU]/h | INTRAVENOUS | Status: DC
  Administered 2019-12-31: 05:00:00 700 [IU]/h via INTRAVENOUS
  Filled 2019-12-31: qty 250

## 2019-12-31 MED ORDER — FAMOTIDINE IN NACL 20-0.9 MG/50ML-% IV SOLN
20.0000 mg | Freq: Two times a day (BID) | INTRAVENOUS | Status: AC
  Administered 2019-12-31 – 2020-01-01 (×2): 20 mg via INTRAVENOUS
  Filled 2019-12-31 (×2): qty 50

## 2019-12-31 MED ORDER — PHENYLEPHRINE HCL-NACL 20-0.9 MG/250ML-% IV SOLN
30.0000 ug/min | INTRAVENOUS | Status: AC
  Administered 2019-12-31: 25 ug/min via INTRAVENOUS
  Filled 2019-12-31: qty 250

## 2019-12-31 MED ORDER — VASOPRESSIN 20 UNIT/ML IV SOLN
INTRAVENOUS | Status: AC
  Filled 2019-12-31: qty 1

## 2019-12-31 MED ORDER — AMIODARONE HCL IN DEXTROSE 360-4.14 MG/200ML-% IV SOLN
30.0000 mg/h | INTRAVENOUS | Status: DC
  Administered 2019-12-31 – 2020-01-01 (×4): 30 mg/h via INTRAVENOUS
  Filled 2019-12-31 (×4): qty 200

## 2019-12-31 MED ORDER — ACETAMINOPHEN 160 MG/5ML PO SOLN: 650.0000 mg | Freq: Once | ORAL | Status: AC

## 2019-12-31 SURGICAL SUPPLY — 16 items
BALLN IABP SENSA PLUS 8F 50CC (BALLOONS) ×3
BALLOON IABP SENS PLUS 8F 50CC (BALLOONS) ×1 IMPLANT
CATH INFINITI 5FR JL4 (CATHETERS) ×3 IMPLANT
CATH INFINITI JR4 5F (CATHETERS) ×3 IMPLANT
DEVICE INFLAT 30 PLUS (MISCELLANEOUS) IMPLANT
KIT CATH CVC 3 LUMEN 7FR 8IN (MISCELLANEOUS) ×3 IMPLANT
KIT MANI 3VAL PERCEP (MISCELLANEOUS) ×3 IMPLANT
KIT TRANSPAC II SGL 4260605 (MISCELLANEOUS) ×3 IMPLANT
NEEDLE PERC 18GX7CM (NEEDLE) ×3 IMPLANT
PACK CARDIAC CATH (CUSTOM PROCEDURE TRAY) ×3 IMPLANT
SHEATH AVANTI 6FR X 11CM (SHEATH) ×3 IMPLANT
SUT SILK 0 FSL (SUTURE) ×3 IMPLANT
TUBING CIL FLEX 10 FLL-RA (TUBING) ×3 IMPLANT
WIRE G HI TQ BMW 190 (WIRE) IMPLANT
WIRE G INSERTION TOOL (WIRE) ×3 IMPLANT
WIRE GUIDERIGHT .035X150 (WIRE) ×3 IMPLANT

## 2019-12-31 SURGICAL SUPPLY — 114 items
ADAPTER CARDIO PERF ANTE/RETRO (ADAPTER) ×4 IMPLANT
ATRICLIP EXCLUSION VLAA 35 (Miscellaneous) ×4 IMPLANT
BAG DECANTER FOR FLEXI CONT (MISCELLANEOUS) ×4 IMPLANT
BASKET HEART (ORDER IN 25'S) (MISCELLANEOUS) ×1
BASKET HEART (ORDER IN 25S) (MISCELLANEOUS) ×3 IMPLANT
BLADE CLIPPER SURG (BLADE) ×4 IMPLANT
BLADE STERNUM SYSTEM 6 (BLADE) ×4 IMPLANT
BLADE SURG 11 STRL SS (BLADE) ×1 IMPLANT
BLADE SURG 12 STRL SS (BLADE) IMPLANT
BNDG ELASTIC 4X5.8 VLCR STR LF (GAUZE/BANDAGES/DRESSINGS) ×4 IMPLANT
BNDG ELASTIC 6X15 VLCR STRL LF (GAUZE/BANDAGES/DRESSINGS) ×1 IMPLANT
BNDG ELASTIC 6X5.8 VLCR STR LF (GAUZE/BANDAGES/DRESSINGS) ×4 IMPLANT
BNDG GAUZE ELAST 4 BULKY (GAUZE/BANDAGES/DRESSINGS) ×4 IMPLANT
CANISTER SUCT 3000ML PPV (MISCELLANEOUS) ×4 IMPLANT
CANNULA GUNDRY RCSP 15FR (MISCELLANEOUS) IMPLANT
CANNULA NON VENT 18FR 12 (CANNULA) ×2 IMPLANT
CATH CPB KIT HENDRICKSON (MISCELLANEOUS) ×4 IMPLANT
CATH HEART VENT LEFT (CATHETERS) IMPLANT
CATH ROBINSON RED A/P 18FR (CATHETERS) ×10 IMPLANT
CLAMP ISOLATOR SYNERGY LG (MISCELLANEOUS) ×1 IMPLANT
CLAMP OLL ABLATION (MISCELLANEOUS) IMPLANT
CLIP RETRACTION 3.0MM CORONARY (MISCELLANEOUS) ×3 IMPLANT
CLIP VESOCCLUDE SM WIDE 24/CT (CLIP) ×2 IMPLANT
CONN ST 1/4X3/8  BEN (MISCELLANEOUS) ×2
CONN ST 1/4X3/8 BEN (MISCELLANEOUS) IMPLANT
CONT SPEC 4OZ CLIKSEAL STRL BL (MISCELLANEOUS) ×1 IMPLANT
DERMABOND ADVANCED (GAUZE/BANDAGES/DRESSINGS) ×1
DERMABOND ADVANCED .7 DNX12 (GAUZE/BANDAGES/DRESSINGS) ×3 IMPLANT
DEVICE EXCLUSIN ATRCLP VLAA 35 (Miscellaneous) IMPLANT
DRAIN CHANNEL 28F RND 3/8 FF (WOUND CARE) ×11 IMPLANT
DRAPE CARDIOVASCULAR INCISE (DRAPES) ×1
DRAPE SLUSH/WARMER DISC (DRAPES) ×4 IMPLANT
DRAPE SRG 135X102X78XABS (DRAPES) ×3 IMPLANT
DRSG AQUACEL AG ADV 3.5X14 (GAUZE/BANDAGES/DRESSINGS) ×4 IMPLANT
ELECT CAUTERY BLADE 6.4 (BLADE) ×4 IMPLANT
ELECT REM PT RETURN 9FT ADLT (ELECTROSURGICAL) ×8
ELECTRODE REM PT RTRN 9FT ADLT (ELECTROSURGICAL) ×6 IMPLANT
FELT TEFLON 1X6 (MISCELLANEOUS) ×7 IMPLANT
GAUZE SPONGE 4X4 12PLY STRL (GAUZE/BANDAGES/DRESSINGS) ×8 IMPLANT
GAUZE SPONGE 4X4 12PLY STRL LF (GAUZE/BANDAGES/DRESSINGS) ×2 IMPLANT
GLOVE NEODERM STRL 7.5 LF PF (GLOVE) ×9 IMPLANT
GLOVE SURG NEODERM 7.5  LF PF (GLOVE) ×3
GLOVE SURG SS PI 6.0 STRL IVOR (GLOVE) ×1 IMPLANT
GOWN STRL REUS W/ TWL LRG LVL3 (GOWN DISPOSABLE) ×12 IMPLANT
GOWN STRL REUS W/TWL LRG LVL3 (GOWN DISPOSABLE) ×8
HEMOSTAT POWDER SURGIFOAM 1G (HEMOSTASIS) ×11 IMPLANT
HEMOSTAT SURGICEL 2X14 (HEMOSTASIS) ×3 IMPLANT
HEMOSTAT SURGICEL 2X4 FIBR (HEMOSTASIS) ×1 IMPLANT
IV ADAPTER SYR DOUBLE MALE LL (MISCELLANEOUS) ×1 IMPLANT
KIT BASIN OR (CUSTOM PROCEDURE TRAY) ×4 IMPLANT
KIT SUCTION CATH 14FR (SUCTIONS) ×4 IMPLANT
KIT SUT CK MINI COMBO 4X17 (Prosthesis & Implant Heart) ×1 IMPLANT
KIT TURNOVER KIT B (KITS) ×4 IMPLANT
KIT VASOVIEW HEMOPRO 2 VH 4000 (KITS) ×4 IMPLANT
LEAD PACING MYOCARDI (MISCELLANEOUS) ×3 IMPLANT
LINE VENT (MISCELLANEOUS) ×1 IMPLANT
MARKER GRAFT CORONARY BYPASS (MISCELLANEOUS) ×11 IMPLANT
NDL 18GX1X1/2 (RX/OR ONLY) (NEEDLE) ×3 IMPLANT
NEEDLE 18GX1X1/2 (RX/OR ONLY) (NEEDLE) IMPLANT
NS IRRIG 1000ML POUR BTL (IV SOLUTION) ×20 IMPLANT
PACK E OPEN HEART (SUTURE) ×4 IMPLANT
PACK OPEN HEART (CUSTOM PROCEDURE TRAY) ×4 IMPLANT
PACK SPY-PHI (KITS) ×1 IMPLANT
PAD ARMBOARD 7.5X6 YLW CONV (MISCELLANEOUS) ×8 IMPLANT
PAD ELECT DEFIB RADIOL ZOLL (MISCELLANEOUS) ×4 IMPLANT
PENCIL BUTTON HOLSTER BLD 10FT (ELECTRODE) ×4 IMPLANT
POSITIONER HEAD DONUT 9IN (MISCELLANEOUS) ×4 IMPLANT
POWDER SURGICEL 3.0 GRAM (HEMOSTASIS) ×1 IMPLANT
PUNCH AORTIC ROTATE 4.0MM (MISCELLANEOUS) ×1 IMPLANT
SET CANNULATION TOURNIQUET (MISCELLANEOUS) ×1 IMPLANT
SET CARDIOPLEGIA MPS 5001102 (MISCELLANEOUS) ×1 IMPLANT
SOL ANTI FOG 6CC (MISCELLANEOUS) IMPLANT
SOLUTION ANTI FOG 6CC (MISCELLANEOUS) ×1
SPONGE LAP 18X18 X RAY DECT (DISPOSABLE) ×1 IMPLANT
SUT BONE WAX W31G (SUTURE) ×4 IMPLANT
SUT ETHIBOND 2 0 SH (SUTURE) ×1
SUT ETHIBOND 2 0 SH 36X2 (SUTURE) IMPLANT
SUT ETHIBOND X763 2 0 SH 1 (SUTURE) ×2 IMPLANT
SUT MNCRL AB 3-0 PS2 18 (SUTURE) ×8 IMPLANT
SUT PDS AB 1 CTX 36 (SUTURE) ×8 IMPLANT
SUT PROLENE 3 0 SH DA (SUTURE) ×7 IMPLANT
SUT PROLENE 4 0 SH DA (SUTURE) ×2 IMPLANT
SUT PROLENE 5 0 C 1 36 (SUTURE) IMPLANT
SUT PROLENE 6 0 C 1 30 (SUTURE) ×11 IMPLANT
SUT PROLENE 7 0 BV 1 (SUTURE) ×2 IMPLANT
SUT PROLENE 8 0 BV175 6 (SUTURE) IMPLANT
SUT PROLENE BLUE 7 0 (SUTURE) ×4 IMPLANT
SUT SILK  1 MH (SUTURE) ×1
SUT SILK 1 MH (SUTURE) IMPLANT
SUT SILK 2 0 SH CR/8 (SUTURE) ×1 IMPLANT
SUT SILK 3 0 SH CR/8 (SUTURE) IMPLANT
SUT STEEL 6MS V (SUTURE) ×4 IMPLANT
SUT STEEL SZ 6 DBL 3X14 BALL (SUTURE) ×4 IMPLANT
SUT VIC AB 2-0 CT1 27 (SUTURE) ×1
SUT VIC AB 2-0 CT1 TAPERPNT 27 (SUTURE) IMPLANT
SUT VIC AB 2-0 CTX 27 (SUTURE) IMPLANT
SUT VIC AB 3-0 X1 27 (SUTURE) IMPLANT
SYR 10ML LL (SYRINGE) ×2 IMPLANT
SYR 30ML LL (SYRINGE) ×4 IMPLANT
SYR 3ML LL SCALE MARK (SYRINGE) ×4 IMPLANT
SYSTEM SAHARA CHEST DRAIN ATS (WOUND CARE) ×4 IMPLANT
TAPE CLOTH SURG 4X10 WHT LF (GAUZE/BANDAGES/DRESSINGS) ×1 IMPLANT
TAPE PAPER 2X10 WHT MICROPORE (GAUZE/BANDAGES/DRESSINGS) ×1 IMPLANT
TOWEL GREEN STERILE (TOWEL DISPOSABLE) ×4 IMPLANT
TOWEL GREEN STERILE FF (TOWEL DISPOSABLE) ×4 IMPLANT
TRAY CATH LUMEN 1 20CM STRL (SET/KITS/TRAYS/PACK) ×1 IMPLANT
TRAY FOLEY SLVR 16FR TEMP STAT (SET/KITS/TRAYS/PACK) ×4 IMPLANT
TUBING ART PRESS 48 MALE/FEM (TUBING) ×2 IMPLANT
TUBING LAP HI FLOW INSUFFLATIO (TUBING) ×4 IMPLANT
UNDERPAD 30X30 (UNDERPADS AND DIAPERS) ×4 IMPLANT
VALVE SYSTEM EDWS INTUITY 21A (Prosthesis & Implant Heart) ×1 IMPLANT
VENT LEFT HEART 12002 (CATHETERS) ×4
WATER STERILE IRR 1000ML POUR (IV SOLUTION) ×8 IMPLANT
WATER STERILE IRR 1000ML UROMA (IV SOLUTION) ×1 IMPLANT

## 2019-12-31 NOTE — ED Notes (Signed)
Pts daughter called to inform of pts condition and plan.

## 2019-12-31 NOTE — Transfer of Care (Signed)
Immediate Anesthesia Transfer of Care Note  Patient: David Perez  Procedure(s) Performed: CORONARY ARTERY BYPASS GRAFTING (CABG) TIMES THREE USING LEFT INTERNAL MAMMARY ARTERY AND LEFT GREATER SAPHENOUS LEG VEIN HARVESTED ENDOSCOPICALLY (N/A Chest) AORTIC VALVE REPLACEMENT (AVR) (N/A ) MAZE (N/A ) TRANSESOPHAGEAL ECHOCARDIOGRAM (TEE) (N/A ) INDOCYANINE GREEN FLUORESCENCE IMAGING (ICG) (N/A ) Clipping Of Atrial Appendage (Left Chest)  Patient Location: SICU  Anesthesia Type:General  Level of Consciousness: Patient remains intubated per anesthesia plan  Airway & Oxygen Therapy: Patient remains intubated per anesthesia plan  Post-op Assessment: Report given to RN and Post -op Vital signs reviewed and stable  Post vital signs: Reviewed and stable  Last Vitals:  Vitals Value Taken Time  BP 140/85 12/31/19 1910  Temp 36.4 C 12/31/19 1911  Pulse 88 12/31/19 1911  Resp 18 12/31/19 1911  SpO2 100 % 12/31/19 1911  Vitals shown include unvalidated device data.  Last Pain:  Vitals:   12/31/19 1120  TempSrc: Oral  PainSc:          Complications: No apparent anesthesia complications

## 2019-12-31 NOTE — Progress Notes (Signed)
  Echocardiogram 2D Echocardiogram has been performed.  David Perez A [name] 12/31/2019, 8:30 AM

## 2019-12-31 NOTE — Consult Note (Signed)
CARDIOLOGY CONSULT NOTE               Patient IDJiovanny Perez MRN: [id number] DOB/AGE: January 05, 1941 79 y.o.  Admit date: 12/31/2019 Referring Physician Dr. Karma Greaser Primary Physician [facility] hospitalist Primary Cardiologist [facility] Reason for Consultation STEMI  HPI: 79 year old white male originally from [place] moved down to [place] over 20 years states of been doing reasonably well until this evening when he started having indigestion feeling midsternal chest discomfort he took some Tums and and try to go to bed the pain got progressively worse with some radiation to the right side she finally called rescue and was brought in.  Rescues EKG suggested probable STEMI with diffuse ST depression with isolated ST elevation in V1 V2.  Patient remained hemodynamically stable after presenting to the emergency room he was treated with heparin aspirin nitrates which seemed to improve his condition.  He was brought to the Cath Lab anyway was found to have significant disease in the left main and ostial LAD.  Chest pain was significant improved.  Denies any previous cardiac history and is on no medications still fairly active.  Patient was then brought to the cardiac Cath Lab after code STEMI was activated  Review of systems complete and found to be negative unless listed above     Past Medical History:  Diagnosis Date  . Hepatitis B     History reviewed. No pertinent surgical history.  No medications prior to admission.   Social History   Socioeconomic History  . Marital status: Not on file    Spouse name: Not on file  . Number of children: Not on file  . Years of education: Not on file  . Highest education level: Not on file  Occupational History  . Not on file  Tobacco Use  . Smoking status: Never Smoker  . Smokeless tobacco: Never Used  Substance and Sexual Activity  . Alcohol use: Not Currently  . Drug use: Not Currently  . Sexual activity: Not Currently  Other Topics  Concern  . Not on file  Social History Narrative  . Not on file   Social Determinants of Health   Financial Resource Strain:   . Difficulty of Paying Living Expenses: Not on file  Food Insecurity:   . Worried About Charity fundraiser in the Last Year: Not on file  . Ran Out of Food in the Last Year: Not on file  Transportation Needs:   . Lack of Transportation (Medical): Not on file  . Lack of Transportation (Non-Medical): Not on file  Physical Activity:   . Days of Exercise per Week: Not on file  . Minutes of Exercise per Session: Not on file  Stress:   . Feeling of Stress : Not on file  Social Connections:   . Frequency of Communication with Friends and Family: Not on file  . Frequency of Social Gatherings with Friends and Family: Not on file  . Attends Religious Services: Not on file  . Active Member of Clubs or Organizations: Not on file  . Attends Archivist Meetings: Not on file  . Marital Status: Not on file  Intimate Partner Violence:   . Fear of Current or Ex-Partner: Not on file  . Emotionally Abused: Not on file  . Physically Abused: Not on file  . Sexually Abused: Not on file    No family history on file.    Review of systems complete and found to be negative unless listed  above      PHYSICAL EXAM  General: Well developed, well nourished, in no acute distress HEENT:  Normocephalic and atramatic Neck:  No JVD.  Lungs: Clear bilaterally to auscultation and percussion. Heart: HRRR . Normal S1 and S2 without gallops or murmurs.  Abdomen: Bowel sounds are positive, abdomen soft and non-tender  Msk:  Back normal, normal gait. Normal strength and tone for age. Extremities: No clubbing, cyanosis or edema.   Neuro: Alert and oriented X 3. Psych:  Good affect, responds appropriately  Labs:   Lab Results  Component Value Date   WBC 17.2 (H) 12/31/2019   HGB 13.6 12/31/2019   HCT 40.2 12/31/2019   MCV 105.2 (H) 12/31/2019   PLT 274 12/31/2019      Recent Labs  Lab 12/31/19 0040  NA 139  K 4.2  CL 103  CO2 22  BUN 29*  CREATININE 1.18  CALCIUM 9.1  PROT 7.7  BILITOT 2.0*  ALKPHOS 65  ALT 28  AST 41  GLUCOSE 231*   No results found for: CKTOTAL, CKMB, CKMBINDEX, TROPONINI  Lab Results  Component Value Date   CHOL 275 (H) 12/31/2019   Lab Results  Component Value Date   HDL 75 12/31/2019   Lab Results  Component Value Date   LDLCALC 187 (H) 12/31/2019   Lab Results  Component Value Date   TRIG 65 12/31/2019   Lab Results  Component Value Date   CHOLHDL 3.7 12/31/2019   No results found for: LDLDIRECT    Radiology: CARDIAC CATHETERIZATION  Result Date: 12/31/2019  Mid LM to Dist LM lesion is 90% stenosed.  Dist LM to Ost LAD lesion is 99% stenosed.  Mid LAD lesion is 50% stenosed.  Left ventriculogram not performed  Intra-aortic balloon pump inserted right femoral artery  Triple-lumen catheter inserted right femoral vein  Conclusion STEMI presentation High-grade left main lesion around 90% 99% ostial LAD Widely patent large circumflex Nondominant RCA Left dominant system Left ventriculogram not performed Intra-aortic balloon pump inserted for hemodynamic support Triple-lumen catheter placed for IV access Patient was placed on amiodarone Levophed heparin Patient was transferred to [facility] where [facility] Dr. Marigene Ehlers n accepted the patient in transfer    EKG: Normal sinus rhythm nonspecific ST-T wave changes stabilization V1 V2 aVR diffuse ST depression throughout  ASSESSMENT AND PLAN:  STEMI Status post cardiac cath Found to have ostial left main ostial LAD Abnormal EKG Dyspnea Significant chest pain Atrial fibrillation new onset Hyperlipidemia Renal insufficiency Hypoglycemia . Plan Agree with heparin Agree with nitroglycerin Maintain aspirin therapy Consider statin therapy Consider evaluation for diabetes as well as therapy We will take the patient directly to cardiac Cath Lab with  intention to intervene Amiodarone therapy for A. Fib Intra-aortic balloon pump placed Triple-lumen catheter placed Transfer patient to [facility] for possible coronary bypass surgery    Signed: Yolonda Kida MD 12/31/2019, 2:52 AM     .

## 2019-12-31 NOTE — Anesthesia Preprocedure Evaluation (Addendum)
Anesthesia Evaluation  Patient identified by MRN, date of birth, ID band Patient awake    Reviewed: Allergy & Precautions, NPO status , Patient's Chart, lab work & pertinent test results  Airway Mallampati: II  TM Distance: >3 FB Neck ROM: Full    Dental  (+) Edentulous Upper, Edentulous Lower   Pulmonary neg pulmonary ROS,    breath sounds clear to auscultation + decreased breath sounds      Cardiovascular + CAD and + Past MI  + Valvular Problems/Murmurs AS and MR  Rhythm:Regular Rate:Normal  Echo 12/31/19 Severe AS, EF ~40%   Neuro/Psych negative neurological ROS  negative psych ROS   GI/Hepatic negative GI ROS, (+) Hepatitis -  Endo/Other  negative endocrine ROS  Renal/GU negative Renal ROS     Musculoskeletal negative musculoskeletal ROS (+)   Abdominal   Peds  Hematology negative hematology ROS (+)   Anesthesia Other Findings   Reproductive/Obstetrics                            Anesthesia Physical Anesthesia Plan  ASA: V and emergent  Anesthesia Plan: General   Post-op Pain Management:    Induction: Intravenous  PONV Risk Score and Plan: 1 and Midazolam  Airway Management Planned: Oral ETT  Additional Equipment: Arterial line, CVP, TEE, PA Cath and Ultrasound Guidance Line Placement  Intra-op Plan: Utilization Of Total Body Hypothermia per surgeon request  Post-operative Plan: Post-operative intubation/ventilation  Informed Consent: I have reviewed the patients History and Physical, chart, labs and discussed the procedure including the risks, benefits and alternatives for the proposed anesthesia with the patient or authorized representative who has indicated his/her understanding and acceptance.     Dental advisory given  Plan Discussed with: CRNA  Anesthesia Plan Comments:         Anesthesia Quick Evaluation

## 2019-12-31 NOTE — Anesthesia Procedure Notes (Signed)
Central Venous Catheter Insertion Performed by: Nolon Nations, MD, anesthesiologist Start/End1/28/2021 1:15 PM, 12/31/2019 1:30 PM Patient location: Pre-op. Preanesthetic checklist: patient identified, IV checked, site marked, risks and benefits discussed, surgical consent, monitors and equipment checked, pre-op evaluation, timeout performed and anesthesia consent Position: Trendelenburg Lidocaine 1% used for infiltration and patient sedated Hand hygiene performed  and maximum sterile barriers used  Catheter size: 9 Fr MAC introducer PA Cath depth:53 Procedure performed using ultrasound guided technique. Ultrasound Notes:anatomy identified, needle tip was noted to be adjacent to the nerve/plexus identified, no ultrasound evidence of intravascular and/or intraneural injection and image(s) printed for medical record Attempts: 1 Following insertion, line sutured, dressing applied and Biopatch. Post procedure assessment: blood return through all ports, free fluid flow and no air  Patient tolerated the procedure well with no immediate complications.

## 2019-12-31 NOTE — H&P (Signed)
History and Physical Interval Note:  12/31/2019 11:28 AM  David Perez  has presented today for surgery, with the diagnosis of CAD AS.  The various methods of treatment have been discussed with the patient and family. After consideration of risks, benefits and other options for treatment, the patient has consented to  Procedure(s) with comments: CORONARY ARTERY BYPASS GRAFTING (CABG) (N/A) - POSSIBLE BILATERAL IMA POSSIBLE AORTIC VALVE REPLACEMENT (AVR) (N/A) POSSIBLE MAZE (N/A) TRANSESOPHAGEAL ECHOCARDIOGRAM (TEE) (N/A) INDOCYANINE GREEN FLUORESCENCE IMAGING (ICG) (N/A) as a surgical intervention.  The patient's history has been reviewed, patient examined, no change in status, stable for surgery.  I have reviewed the patient's chart and labs.  Questions were answered to the patient's satisfaction.     Wonda Olds

## 2019-12-31 NOTE — Procedures (Addendum)
Intra-aortic balloon pump placement at the end of the diagnostic cath for STEMI patient was found to have left main ostial LAD high-grade stenosis ulcerated plaque with some hypotension improving symptoms.  Intra-aortic balloon pump was placed for stabilization prior to transfer to [facility] for probable coronary bypass surgery.  Intrarenal pump was placed in the right femoral artery in the usual fashion without difficulty heparin was started patient was started on one-to-one he converted to atrial fibrillation rapid ventricular response which caused further hypotension so the patient also was placed on amiodarone Levophed for blood pressure support in addition to the intra-aortic balloon pump chest pain was significantly improved patient also had improved shortness of breath. Because of access issues a triple-lumen catheter was placed in the right femoral vein as well both areas were sutured in place patient awaiting transfer to [facility] via [facility]

## 2019-12-31 NOTE — H&P (Addendum)
Advanced Heart Failure Team History and Physical Note   PCP:  No primary care provider on file.  PCP-Cardiology: No primary care provider on file.     Reason for Admission: STEMI   HPI:    79 y.o. with minimal past history was transferred by Dr. Clayborn Bigness from [facility] tonight for evaluation for CABG.  He has done well in general, remains very active, lives alone in [place], and has been taking no medications.  No history of cardiac problems. On 1/27 in the afternoon, he noted "indigestion" in low chest/upper abdomen.  This resolved and he went to bed early. However, he woke later in the evening with severe chest pressure in the central chest.  He called EMS, felt better after getting NTG.    In the ER at [facility], ECG showed diffuse ST depression with STE in V1 and AVR.  COVID-19 negative.  He was taken to the cath lab emergently, cath showed very short LM, 99% ostial LAD, patent dominant LCx, nondominant right. The ostial LAD lesion was thought to be not optimal for PCI, would endanger the ostial LCx.  He was chest pain free in the cath lab, and decision was made to place IABP and transfer him to [facility] for CABG evaluation. Post-cath, he went into atrial fibrillation with RVR with drop in SBP to 80s. Amiodarone gtt and norepinephrine gtt were started.   On arrival to [facility], he went back into NSR in 90s.  SBP in 90s-100s on norepinephrine 10 and IABP was increased to 1:1 (1:2 while in afib).  He feels good, no chest pressure or dyspnea.   Review of Systems: All systems reviewed and negative except as per HPI.   Home Medications Prior to Admission medications   Not on File    Past Medical History: Past Medical History:  Diagnosis Date  . Hepatitis B     Past Surgical History: No past surgical history on file.  Family History:  CAD  Social History: Social History   Socioeconomic History  . Marital status: Not on file    Spouse name: Not on file  . Number of children: Not on file   . Years of education: Not on file  . Highest education level: Not on file  Occupational History  . Not on file  Tobacco Use  . Smoking status: Never Smoker  . Smokeless tobacco: Never Used  Substance and Sexual Activity  . Alcohol use: Not Currently  . Drug use: Not Currently  . Sexual activity: Not Currently  Other Topics Concern  . Not on file  Social History Narrative  . Not on file   Social Determinants of Health   Financial Resource Strain:   . Difficulty of Paying Living Expenses: Not on file  Food Insecurity:   . Worried About Charity fundraiser in the Last Year: Not on file  . Ran Out of Food in the Last Year: Not on file  Transportation Needs:   . Lack of Transportation (Medical): Not on file  . Lack of Transportation (Non-Medical): Not on file  Physical Activity:   . Days of Exercise per Week: Not on file  . Minutes of Exercise per Session: Not on file  Stress:   . Feeling of Stress : Not on file  Social Connections:   . Frequency of Communication with Friends and Family: Not on file  . Frequency of Social Gatherings with Friends and Family: Not on file  . Attends Religious Services: Not on  file  . Active Member of Clubs or Organizations: Not on file  . Attends Archivist Meetings: Not on file  . Marital Status: Not on file    Allergies:  No Known Allergies  Objective:    Vital Signs:   Temp:  [97.9 F (36.6 C)] 97.9 F (36.6 C) (01/28 0243) Pulse Rate:  [102-127] 127 (01/28 0243) Resp:  [25-28] 26 (01/28 0243) BP: (84-130)/(63-81) 84/63 (01/28 0243) SpO2:  [90 %-97 %] 97 % (01/28 0243) Weight:  [54.4 kg-59 kg] 54.4 kg (01/28 0046)   There were no vitals filed for this visit.   Physical Exam     General:  Well appearing. No respiratory difficulty HEENT: Normal Neck: Supple. no JVD. Carotids 2+ bilat; no bruits. No lymphadenopathy or thyromegaly appreciated. Cor: PMI nondisplaced. Regular rate & rhythm. There is a systolic murmur  obscured by IABP sounds.  Lungs: Clear Abdomen: Soft, nontender, nondistended. No hepatosplenomegaly. No bruits or masses. Good bowel sounds. Extremities: No cyanosis, clubbing, rash, edema. IABP in right groin.  Neuro: Alert & oriented x 3, cranial nerves grossly intact. moves all 4 extremities w/o difficulty. Affect pleasant.   Telemetry   Atrial fibrillation/RVR => NSR 90s (personally reviewed).   EKG   NSR, diffuse ST depression, STE in V1 and AVR (personally reviewed)  Labs     Basic Metabolic Panel: Recent Labs  Lab 12/31/19 0040  NA 139  K 4.2  CL 103  CO2 22  GLUCOSE 231*  BUN 29*  CREATININE 1.18  CALCIUM 9.1  MG 2.0    Liver Function Tests: Recent Labs  Lab 12/31/19 0040  AST 41  ALT 28  ALKPHOS 65  BILITOT 2.0*  PROT 7.7  ALBUMIN 3.9   No results for input(s): LIPASE, AMYLASE in the last 168 hours. No results for input(s): AMMONIA in the last 168 hours.  CBC: Recent Labs  Lab 12/31/19 0040  WBC 17.2*  NEUTROABS 10.0*  HGB 13.6  HCT 40.2  MCV 105.2*  PLT 274    Cardiac Enzymes: No results for input(s): CKTOTAL, CKMB, CKMBINDEX, TROPONINI in the last 168 hours.  BNP: BNP (last 3 results) No results for input(s): BNP in the last 8760 hours.  ProBNP (last 3 results) No results for input(s): PROBNP in the last 8760 hours.   CBG: No results for input(s): GLUCAP in the last 168 hours.  Coagulation Studies: Recent Labs    12/31/19 0040  LABPROT 14.8  INR 1.2    Imaging: CARDIAC CATHETERIZATION  Result Date: 12/31/2019  Mid LM to Dist LM lesion is 90% stenosed.  Dist LM to Ost LAD lesion is 99% stenosed.  Mid LAD lesion is 50% stenosed.  Left ventriculogram not performed  Intra-aortic balloon pump inserted right femoral artery  Triple-lumen catheter inserted right femoral vein  Conclusion STEMI presentation High-grade left main lesion around 90% 99% ostial LAD Widely patent large circumflex Nondominant RCA Left dominant  system Left ventriculogram not performed Intra-aortic balloon pump inserted for hemodynamic support Triple-lumen catheter placed for IV access Patient was placed on amiodarone Levophed heparin Patient was transferred to [facility] where [facility] Dr. Marigene Ehlers n accepted the patient in transfer       Assessment/Plan   1. CAD: STEMI with very short LM and 99% ostial LAD.  PCI thought to risk compromise of ostial large LCx, so decision made to defer PCI and transfer to [facility] for CABG as patient was CP-free at that point.  - Continue heparin gtt.  -  Evaluation by [facility] for CABG.  - Continue ASA 81.  - Atorvastatin 80 mg daily.  2. Cardiogenic shock:  IABP was placed in cath lab primarily as a precaution with severe unrevascularized CAD, but patient subsequently went into atrial fibrillation and SBP dropped to 80s.  He is now on norepinephrine 10.  He has a femoral CVL.  He is now back in NSR with improved BP.  - Titrate down norepinephrine for MAP 65.  - He needs an echo done now to assess LV function.  - If he does not go to OR today, will need to consider Swan placement.  3. Atrial fibrillation: With RVR.  No prior history of this. He is now back in NSR on amiodarone.  - Continue amiodarone gtt.  - Continue heparin gtt.  4. Hyperlipidemia: LDL 187, started atorvastatin 80 daily.    Loralie Champagne, MD 12/31/2019, 4:12 AM  Advanced Heart Failure Team Pager [phone] (M-F; 7a - 4p)  Please contact [facility] for night-coverage after hours (4p -7a ) and weekends on [email or web address]

## 2019-12-31 NOTE — Op Note (Signed)
CARDIOTHORACIC SURGERY OPERATIVE NOTE  Date of Procedure: 12/31/2019  Preoperative Diagnosis: Left main Coronary Artery Disease, s/p STEMI; severe AS, paroxysmal atrial fibrillation  Postoperative Diagnosis: Same  Procedure:    Aortic valve replacement 21 mm Edwards Intuity bovine bioprosthetic  Coronary Artery Bypass Grafting x 3   Left Internal Mammary Artery to Distal Left Anterior Descending Coronary Artery; Saphenous Vein Graft to Posterior Descending Coronary Artery; Saphenous Vein Graft to 1st Obtuse Marginal Branch of Left Circumflex Coronary Artery;; Endoscopic Vein Harvest from left Thigh and Lower Leg; Bilateral pulmonary vein isolation and LAA clipping Completion graft surveillance with indocyanine green fluorescence imaging (SPY); multilevel rib block with exparel/marcaine solution   Surgeon: B. Murvin Natal, MD  Assistant: Joellyn Rued PA-C  Anesthesia: get  Operative Findings:  Moderately reduced left ventricular systolic function  Good quality left internal mammary artery conduit  Good quality saphenous vein conduit  Good quality target vessels for grafting  Well-seated AVR  Moderately depressed RV function with moderate TR    BRIEF CLINICAL NOTE AND INDICATIONS FOR SURGERY  79 yo man developed relatively sudden onset chest pain yesterday, but he has experienced several months of increasing fatigue. Work-up demonstrated STEMI and he was taken to the cath lab in [facility] where LM CAD was diagnosed. Had IABP inserted and txferred to [facility]. Further work-up has demonstrated severe AS with mean transvalvular gradient approximately 45 mm Hg. He presents for CABG/AVR and atrial ablation given history of afib and high risk for afib postoperatively.    DETAILS OF THE OPERATIVE PROCEDURE  Preparation:  The patient is brought to the operating room on the above mentioned date and central monitoring was established by the anesthesia team including placement of Swan-Ganz  catheter and radial arterial line. The patient is placed in the supine position on the operating table.  Intravenous antibiotics are administered. General endotracheal anesthesia is induced uneventfully. A Foley catheter is placed.  Baseline transesophageal echocardiogram was performed.  Findings were notable for LV EF approximately 40%, severe AS, moderate TR  The patient's chest, abdomen, both groins, and both lower extremities are prepared and draped in a sterile manner. A time out procedure is performed.   Surgical Approach and Conduit Harvest:  A median sternotomy incision was performed and the left internal mammary artery is dissected from the chest wall and prepared for bypass grafting. The left internal mammary artery is notably good quality conduit. Simultaneously, the greater saphenous vein is obtained from the patient's left thigh using endoscopic vein harvest technique. The saphenous vein is notably good quality conduit. After removal of the saphenous vein, the small surgical incisions in the lower extremity are closed with absorbable suture. Following systemic heparinization, the left internal mammary artery was transected distally noted to have excellent flow.   Extracorporeal Cardiopulmonary Bypass and Myocardial Protection:  The pericardium is opened. The ascending aorta is nondiseased in appearance. The ascending aorta and the right atrium are cannulated for cardiopulmonary bypass.  Adequate heparinization is verified.   A retrograde cardioplegia cannula is placed through the right atrium into the coronary sinus. A left ventricular vent is inserted through the Right superior pulmonary vein. CO2 is blown into the field.   The entire pre-bypass portion of the operation was notable for stable hemodynamics.  Cardiopulmonary bypass was begun and the surface of the heart is inspected. Distal target vessels are selected for coronary artery bypass grafting. A cardioplegia cannula is placed  in the ascending aorta.   The patient is allowed to cool passively  to 34C systemic temperature. The right sided pulmonary veins are encircled and treated with RFA clamp to isolate the structures from the LA. The aortic cross clamp is applied and cold blood cardioplegia is delivered initially in an antegrade fashion through the aortic root. Supplemental cardioplegia is given retrograde through the coronary sinus catheter.  Iced saline slush is applied for topical hypothermia.  The initial cardioplegic arrest is rapid with early diastolic arrest.  Repeat doses of cardioplegia are administered intermittently throughout the entire cross clamp portion of the operation through the aortic root, through the coronary sinus catheter, and through subsequently placed vein grafts in order to maintain completely flat electrocardiogram. Attention is turned to the left-sided pulmonary veins. The Ligament of Ruthann Cancer is divided with electrocautery. The veins are encircled and treated with RFA clamp to isolate them. The left atrial appendage is clipped at its base.   Coronary Artery Bypass Grafting:   The posterior descending branch of the right coronary artery was grafted using a reversed saphenous vein graft in an end-to-side fashion.  At the site of distal anastomosis the target vessel was good quality and measured approximately 1.5 mm in diameter. Anastomotic patency and runoff was confirmed with indocyanine green fluorescence imaging (SPY). The 1st obtuse marginal branch of the left circumflex coronary artery was grafted using a reversed saphenous vein graft in an end-to-side fashion.  At the site of distal anastomosis the target vessel was good quality and measured approximately 1.5 mm in diameter. Anastomotic patency and runoff was confirmed with indocyanine green fluorescence imaging (SPY).    The distal left anterior coronary artery was grafted with the left internal mammary artery in an end-to-side fashion.  At  the site of distal anastomosis the target vessel was good quality and measured approximately 2 mm in diameter. Anastomotic patency and runoff was confirmed with indocyanine green fluorescence imaging (SPY).  Aortic Valve Replacement:  The aorta is opened transversely. The aortic valve is trileaflet and heavily calcified. These are removed sharply and the annulus is debrided. The Intuity sizers are used to size the annulus at 21 mm. 3 sutures are placed at the nadir of each portion of the annulus and then placed through the corresponding sewing cuff of the bioprosthetic valve. The valve is seated within the annulus and the sutures secured. The intra-annular balloon was inflated to secure the prosthesis. The valve seated well. The aorta was closed in layers.   All proximal vein graft anastomoses were placed directly to the ascending aorta prior to removal of the aortic cross clamp.    Procedure Completion:  All proximal and distal coronary anastomoses were inspected for hemostasis and appropriate graft orientation. Epicardial pacing wires are fixed to the right ventricular outflow tract and to the right atrial appendage. The patient is rewarmed to 37C temperature. The patient is weaned and disconnected from cardiopulmonary bypass.  The patient's rhythm at separation from bypass was heart block with A-V pacing.  The patient was weaned from cardiopulmonary bypass without any inotropic support.  Followup transesophageal echocardiogram performed after separation from bypass revealed  no changes from the preoperative exam.  The aortic and venous cannula were removed uneventfully. Protamine was administered to reverse the anticoagulation. The mediastinum and pleural space were inspected for hemostasis and irrigated with saline solution. The mediastinum and bilateral  pleural space were drained using fluted chest tubes placed through separate stab incisions inferiorly. Multilevel rib block was performed with  exparel/marcain mixture.  The soft tissues anterior to the aorta were reapproximated  loosely. The sternum is closed with double strength sternal wire. The soft tissues anterior to the sternum were closed in multiple layers and the skin is closed with a running subcuticular skin closure.  The post-bypass portion of the operation was notable for stable rhythm and hemodynamics.    Disposition:  The patient tolerated the procedure well and is transported to the surgical intensive care in stable condition. There are no intraoperative complications. All sponge instrument and needle counts are verified correct at completion of the operation.    Jayme Cloud, MD 12/31/2019 9:17 PM

## 2019-12-31 NOTE — ED Provider Notes (Signed)
[facility] Emergency Department Provider Note  ____________________________________________   First MD Initiated Contact with Patient 12/31/19 0033     (approximate)  I have reviewed the triage vital signs and the nursing notes.   HISTORY  Chief Complaint Code STEMI  Level 5 caveat:  history/ROS limited by acute/critical illness  HPI David Perez is a 79 y.o. male who is generally quite healthy for his age and has no ongoing medical issues and no prior cardiac history.  He presents by EMS with concerns for acute STEMI.  He reports that he did not feel well earlier tonight with a lot of indigestion in the center and right side of his lower chest or upper abdomen.  He went to bed early.  He woke a couple of hours later, just prior to calling EMS, with severe chest pressure in the same location, primarily the center and right side of his chest.  There is some associated shortness of breath but the shortness of breath is relatively minimal but the pressure was severe.  When EMS arrived he was pale and clammy and their initial EKG was consistent with STEMI so a code STEMI was called from the field and we were alerted in advance.  The patient received a full dose aspirin and 1 nitroglycerin spray prior to arrival.  He refused any additional nitroglycerin and said that it improved the chest pressure somewhat and he felt better upon arriving than he did when EMS originally arrived at his home.  He has no recent illnesses, and denies fever, abdominal pain, and Covid exposure.     Past Medical History:  Diagnosis Date  . Hepatitis B     There are no problems to display for this patient.   History reviewed. No pertinent surgical history.  Prior to Admission medications   Not on File    Allergies Patient has no known allergies.  No family history on file.  Social History Social History   Tobacco Use  . Smoking status: Never Smoker  . Smokeless tobacco:  Never Used  Substance Use Topics  . Alcohol use: Not Currently  . Drug use: Not Currently    Review of Systems Level 5 caveat:  history/ROS limited by acute/critical illness.  Acute onset severe chest pressure as described above.  ____________________________________________   PHYSICAL EXAM:  VITAL SIGNS: ED Triage Vitals  Enc Vitals Group     BP 12/31/19 0040 123/81     Pulse Rate 12/31/19 0040 (!) 110     Resp 12/31/19 0040 (!) 25     Temp 12/31/19 0040 97.9 F (36.6 C)     Temp Source 12/31/19 0040 Oral     SpO2 12/31/19 0040 90 %     Weight 12/31/19 0045 59 kg (130 lb)     Height 12/31/19 0046 1.702 m (5\' 7" )     Head Circumference --      Peak Flow --      Pain Score 12/31/19 0040 5     Pain Loc --      Pain Edu? --      Excl. in [place]? --     Constitutional: Alert and oriented.  Ill-appearing but nontoxic.  Awake and alert and communicative. Eyes: Conjunctivae are normal.  Head: Atraumatic. Nose: No congestion/rhinnorhea. Mouth/Throat: Patient is wearing a mask. Neck: No stridor.  No meningeal signs.   Cardiovascular: Tachycardia, regular rhythm. Good peripheral circulation. Grossly normal heart sounds. Respiratory: The patient is mildly tachypneic and is pursing  his lips to breathe but he says this is because of his pressure in his chest, not difficulty breathing. Gastrointestinal: Soft and nontender. No distention.  Musculoskeletal: No lower extremity tenderness nor edema. No gross deformities of extremities. Neurologic:  Normal speech and language. No gross focal neurologic deficits are appreciated.  Skin:  Skin is pale, warm, mildly diaphoretic and intact. Psychiatric: Mood and affect are normal. Speech and behavior are normal.  ____________________________________________   LABS (all labs ordered are listed, but only abnormal results are displayed)  Labs Reviewed  CBC WITH DIFFERENTIAL/PLATELET - Abnormal; Notable for the following components:       Result Value   WBC 17.2 (*)    RBC 3.82 (*)    MCV 105.2 (*)    MCH 35.6 (*)    RDW 19.4 (*)    nRBC 0.3 (*)    Neutro Abs 10.0 (*)    Lymphs Abs 4.9 (*)    Monocytes Absolute 1.8 (*)    Basophils Absolute 0.2 (*)    All other components within normal limits  COMPREHENSIVE METABOLIC PANEL - Abnormal; Notable for the following components:   Glucose, Bld 231 (*)    BUN 29 (*)    Total Bilirubin 2.0 (*)    GFR calc non Af Amer 58 (*)    All other components within normal limits  LIPID PANEL - Abnormal; Notable for the following components:   Cholesterol 275 (*)    LDL Cholesterol 187 (*)    All other components within normal limits  TROPONIN I (HIGH SENSITIVITY) - Abnormal; Notable for the following components:   Troponin I (High Sensitivity) 145 (*)    All other components within normal limits  RESPIRATORY PANEL BY RT PCR (FLU A&B, COVID)  PROTIME-INR  APTT  MAGNESIUM  HEMOGLOBIN A1C   ____________________________________________  EKG  EMS (PRE-HOSPITAL) ECG REPORT I, Hinda Kehr, the attending physician, personally viewed and interpreted this ECG.  Date: 12/31/2019 EKG Time: 00: 11 Rate: 110 Rhythm: normal sinus rhythm QRS Axis: Left axis deviation Intervals: normal, LVH ST/T Wave abnormalities: ST segment elevation in aVR, V1, V2, and V3.  Substantial ST depression and V4, V5, and V6.  Consistent and concerning for acute STEMI. Narrative Interpretation: Consistent and concerning for acute STEMI.  Activated from the field.   ED ECG REPORT I, Hinda Kehr, the attending physician, personally viewed and interpreted this ECG.  Date: 12/31/2019 EKG Time: 00: 36 Rate: 113 Rhythm: Sinus tachycardia QRS Axis: Left axis deviation Intervals: normal, LVH ST/T Wave abnormalities: ST elevation in aVR, V1, and ST depression in the lateral leads most notable in V6 with 2 to 3 mm of depression. Narrative Interpretation: Suggestive of acute  ischemia  ____________________________________________  RADIOLOGY I, Hinda Kehr, personally viewed and evaluated these images (plain radiographs) as part of my medical decision making, as well as reviewing the written report by the radiologist.  ED MD interpretation:  deferred CXR  Official radiology report(s): No results found.  ____________________________________________   PROCEDURES   Procedure(s) performed (including Critical Care):  .Critical Care Performed by: Hinda Kehr, MD Authorized by: Hinda Kehr, MD   Critical care provider statement:    Critical care time (minutes):  30   Critical care time was exclusive of:  Separately billable procedures and treating other patients   Critical care was necessary to treat or prevent imminent or life-threatening deterioration of the following conditions:  Cardiac failure (STEMI)   Critical care was time spent personally by me on the  following activities:  Development of treatment plan with patient or surrogate, discussions with consultants, evaluation of patient's response to treatment, examination of patient, obtaining history from patient or surrogate, ordering and performing treatments and interventions, ordering and review of laboratory studies, ordering and review of radiographic studies, pulse oximetry, re-evaluation of patient's condition and review of old charts     ____________________________________________   [place] / MDM / [place] / ED COURSE  As part of my medical decision making, I reviewed the following data within the [place] notes reviewed and incorporated, Labs reviewed , EKG interpreted , Old chart reviewed, Discussed with admitting physician , A consult was requested and obtained from this/these consultant(s) Cardiology and Notes from prior ED visits   Differential diagnosis includes, but is not limited to, acute STEMI, other version of ACS, aortic  dissection, pulmonary embolism.  Patient signs and symptoms are most consistent with STEMI.  His prehospital EKG was very concerning but it improved along with his discomfort after nitroglycerin spray and full dose aspirin.  However he still had ischemic changes on his EKG upon arrival to the emergency department and was still feeling ill.  He is generally healthy for an 79 year old and takes no blood thinners.  I authorized heparin 60 units/kg upon his arrival and was in communication with Dr. Clayborn Bigness with cardiology who was also alerted by [facility] when EMS called the STEMI.  Dr. Clayborn Bigness came to the emergency department, evaluated the patient in person, and took him to the Cath Lab.  Blood work is pending including a rapid coronavirus swab.      Clinical Course as of Dec 30 144  Thu Dec 31, 2019  0119 Troponin I (High Sensitivity)(!!): 145 [CF]  0120 Creatinine: 1.18 [CF]  0120 Hemoglobin: 13.6 [CF]  0138 I called the patient placement coordinator Pleas Koch) and verified that she was not aware of the patient due to the patient being in the cath lab and not in the ED.  She is now paging out the admission to Dr. Sidney Ace.   [CF]  0139 I discussed the case by phone with Dr. Sidney Ace who will admit.   [CF]  0145 SARS Coronavirus 2 by RT PCR: NEGATIVE [CF]    Clinical Course User Index [CF] Hinda Kehr, MD     ____________________________________________  FINAL CLINICAL IMPRESSION(S) / ED DIAGNOSES  Final diagnoses:  Acute ST elevation myocardial infarction (STEMI), unspecified artery [facility])     MEDICATIONS GIVEN DURING THIS VISIT:  Medications  0.9 %  sodium chloride infusion ( Intravenous New Bag/Given 12/31/19 0052)  heparin injection (2,000 Units Intravenous Given 12/31/19 0121)  heparin bolus via infusion 4,000 Units (4,000 Units Intravenous Bolus from Bag 12/31/19 0050)     ED Discharge Orders    None      *Please note:  David Black was evaluated in Emergency Department on  12/31/2019 for the symptoms described in the history of present illness. He was evaluated in the context of the global COVID-19 pandemic, which necessitated consideration that the patient might be at risk for infection with the SARS-CoV-2 virus that causes COVID-19. Institutional protocols and algorithms that pertain to the evaluation of patients at risk for COVID-19 are in a state of rapid change based on information released by regulatory bodies including the CDC and federal and state organizations. These policies and algorithms were followed during the patient's care in the ED.  Some ED evaluations and interventions may be delayed as a result of  limited staffing during the pandemic.*  Note:  This document was prepared using Dragon voice recognition software and may include unintentional dictation errors.   Hinda Kehr, MD 12/31/19 [phone]

## 2019-12-31 NOTE — ED Notes (Signed)
Race Kit obtained

## 2019-12-31 NOTE — Brief Op Note (Signed)
12/31/2019  6:38 PM  PATIENT:  David Perez  79 y.o. male  PRE-OPERATIVE DIAGNOSIS:  CAD AS  POST-OPERATIVE DIAGNOSIS:  CAD AS  PROCEDURE:  Procedure(s) with comments: CORONARY ARTERY BYPASS GRAFTING (CABG) TIMES THREE USING LEFT INTERNAL MAMMARY ARTERY AND LEFT GREATER SAPHENOUS LEG VEIN HARVESTED ENDOSCOPICALLY (N/A) - POSSIBLE BILATERAL IMA AORTIC VALVE REPLACEMENT (AVR) (N/A) MAZE (N/A) TRANSESOPHAGEAL ECHOCARDIOGRAM (TEE) (N/A) INDOCYANINE GREEN FLUORESCENCE IMAGING (ICG) (N/A) Clipping Of Atrial Appendage (Left)  SURGEON:  Surgeon(s) and Role:    * Wonda Olds, MD - Primary  PHYSICIAN ASSISTANT: Joellyn Rued, PA-C  ASSISTANTS: staff   ANESTHESIA:   general  BLOOD ADMINISTERED:1000 CC PRBC, 414 CC CELLSAVER, 500 FFP and 200 PLTS  DRAINS: 3 Chest Tube(s) in the mediastinum and bilateral pleural spaces   LOCAL MEDICATIONS USED:  OTHER exparel/marcaine mixture  SPECIMEN:  No Specimen  DISPOSITION OF SPECIMEN:  N/A  COUNTS:  YES  TOURNIQUET:  * No tourniquets in log *  DICTATION: .Note written in EPIC  PLAN OF CARE: Admit to inpatient   PATIENT DISPOSITION:  ICU - intubated and critically ill.   Delay start of Pharmacological VTE agent (>24hrs) due to surgical blood loss or risk of bleeding: yes

## 2019-12-31 NOTE — ED Notes (Signed)
Cardiologist Dr. Clayborn Bigness to bedside

## 2019-12-31 NOTE — Progress Notes (Signed)
ANTICOAGULATION CONSULT NOTE - Initial Consult  Pharmacy Consult for Heparin  Indication: IABP, s/p cath with CAD, awaiting CVTS consult for ?CABG, Afib  No Known Allergies  Patient Measurements: 54 kg  Vital Signs: Temp: 97.9 F (36.6 C) (01/28 0243) Temp Source: Oral (01/28 0040) BP: 84/63 (01/28 0243) Pulse Rate: 127 (01/28 0243)  Labs: Recent Labs    12/31/19 0040  HGB 13.6  HCT 40.2  PLT 274  APTT 29  LABPROT 14.8  INR 1.2  CREATININE 1.18  TROPONINIHS 145*    Estimated Creatinine Clearance: 38.4 mL/min (by C-G formula based on SCr of 1.18 mg/dL).   Medical History: Past Medical History:  Diagnosis Date  . Hepatitis B     Assessment: 79 y/o M s/p cath transfer from [facility] with multiple issues. Pt with deferred PCI in favor of CVTS consult for CABG, now with IABP in place, also in Afib. Heparin running at 1000 units/hr, will decrease some s/p cath with IABP in.   Goal of Therapy:  Heparin level 0.2-0.5 units/ml, IABP in place Monitor platelets by anticoagulation protocol: Yes   Plan:  Heparin 700 units/hr Heparin level at 1100 Monitor for bleeding F/U CVTS plans  Narda Bonds, PharmD, BCPS Clinical Pharmacist Phone: [phone]

## 2019-12-31 NOTE — Progress Notes (Signed)
ANTICOAGULATION CONSULT NOTE - Initial Consult  Pharmacy Consult for heparin Indication: chest pain/ACS  No Known Allergies  Patient Measurements: Height: 5\' 7"  (170.2 cm) Weight: 120 lb (54.4 kg) IBW/kg (Calculated) : 66.1 Heparin Dosing Weight: 54.4 kg  Vital Signs: Temp: 97.9 F (36.6 C) (01/28 0243) Temp Source: Oral (01/28 0040) BP: 84/63 (01/28 0243) Pulse Rate: 127 (01/28 0243)  Labs: Recent Labs    12/31/19 0040  HGB 13.6  HCT 40.2  PLT 274  APTT 29  LABPROT 14.8  INR 1.2  CREATININE 1.18  TROPONINIHS 145*    Estimated Creatinine Clearance: 38.4 mL/min (by C-G formula based on SCr of 1.18 mg/dL).   Medical History: Past Medical History:  Diagnosis Date  . Hepatitis B     Medications:  Scheduled:  . aspirin  324 mg Oral NOW   Or  . aspirin  300 mg Rectal NOW  . rosuvastatin  40 mg Oral q1800    Assessment: Patient arrives to ED as code STEMI w/ trops of 145 and EKG showing ST elevation in leads aVR, V1, V1, and ST depression in leads II, and V5. Was rushed to cath receiving heparin 4000 units IV x 1 bolus. Patient is not post cath and going to be started on heparin drip for post-cath management of STEMI. Baseline labs WNL.  Goal of Therapy:  Heparin level 0.3-0.7 units/ml Monitor platelets by anticoagulation protocol: Yes   Plan:  Patient received heparin 4000 units IV x 1 in ED prior to going to cath. Will start rate at 700 units/hr (roughly 13 units/kg/hr). Will check anti-Xa at 1300. Will monitor daily CBC's and adjust per anti-Xa levels. Patient appears to have been transferred to [facility], will defer further management of heparin drip to pharmacy team at [facility].  Tobie Lords, PharmD, BCPS Clinical Pharmacist 12/31/2019,4:28 AM

## 2019-12-31 NOTE — Anesthesia Procedure Notes (Signed)
Procedure Name: Intubation Date/Time: 12/31/2019 12:09 PM Performed by: Clearnce Sorrel, CRNA Pre-anesthesia Checklist: Patient identified, Emergency Drugs available, Suction available, Patient being monitored and Timeout performed Patient Re-evaluated:Patient Re-evaluated prior to induction Oxygen Delivery Method: Circle system utilized Preoxygenation: Pre-oxygenation with 100% oxygen Induction Type: IV induction Ventilation: Mask ventilation without difficulty and Oral airway inserted - appropriate to patient size Laryngoscope Size: Mac and 3 Grade View: Grade I Tube type: Oral Tube size: 8.0 mm Number of attempts: 1 Airway Equipment and Method: Stylet Placement Confirmation: ETT inserted through vocal cords under direct vision,  positive ETCO2 and breath sounds checked- equal and bilateral Secured at: 23 cm Tube secured with: Tape Dental Injury: Teeth and Oropharynx as per pre-operative assessment

## 2019-12-31 NOTE — Progress Notes (Signed)
Carotid duplex  has been completed. Refer to [facility] under chart review to view preliminary results.   12/31/2019  10:34 AM David Perez, David Perez

## 2019-12-31 NOTE — Anesthesia Procedure Notes (Signed)
Central Venous Catheter Insertion Performed by: Nolon Nations, MD, anesthesiologist Patient location: Pre-op. Preanesthetic checklist: patient identified, IV checked, site marked, risks and benefits discussed, surgical consent, monitors and equipment checked, pre-op evaluation, timeout performed and anesthesia consent Hand hygiene performed  and maximum sterile barriers used  PA cath was placed.Swan type:thermodilution PA Cath depth:53 Procedure performed without using ultrasound guided technique. Attempts: 1 Patient tolerated the procedure well with no immediate complications.

## 2019-12-31 NOTE — Progress Notes (Addendum)
Patient ID: David Perez, male   DOB: 24-Jul-1941, 79 y.o.   MRN: [id number]  Patient is stable this morning, he has been weaned off norepinephrine.  He remains on IABP 1:1 with stable BP.  Some bleeding at IABP site this morning, he moved and shifted the catheter.    Will get CXR.   Dr. Orvan Seen to see for CABG evaluation.  Needs echo this morning.   Loralie Champagne 12/31/2019 7:33 AM  Echo reviewed, EF about 35% on my read with severe aortic stenosis, moderate mitral regurgitation.  I think that he will need CABG + AVR + Maze.   Loralie Champagne 12/31/2019 12:42 PM

## 2019-12-31 NOTE — ED Notes (Signed)
Pt in route to Cath Lab

## 2019-12-31 NOTE — Progress Notes (Signed)
   12/31/19 0300  Clinical Encounter Type  Visited With Family  Visit Type Initial  Referral From Nurse  Consult/Referral To Chaplain  Chaplain sat in waiting room with daughter, Cristie Hem while patient was in Cath Lab. Daughter was quite nervous not knowing exactly what was going on with her father. After about an hour, Dr. Clayborn Bigness came and explained what what going on. Dr. Clayborn Bigness explained that patient had a heart attack, but he was doing quite well. Dr. Clayborn Bigness also explained that patient would need surgery within the next 24 hours, but was being transferred to [facility]). Cristie Hem was a little relaxed after hearing from the doctor. She was comfortable with patient being transferred to [facility] and she felt confident that he would be well taken care of. Chaplain offered pastoral presence, empathy, and prayer.

## 2019-12-31 NOTE — Consult Note (Signed)
[address]       [place] [address]             [phone]        Al Mones Starr Medical Record [id number] Date of Birth: [birth year]  Referring: No ref. provider found Primary Care: No primary care provider on file. Primary Cardiologist:No primary care provider on file.  Chief Complaint:   No chief complaint on file. precordial chest pain  History of Present Illness:     79 yo man without significant PMHx noted increased fatigue and SOB with ambulation of approximately 1/4 mile in past few months. He also noted "heavy legs" after taking out the garbage and he stopped lifting weights and other strenuous activity due to fatigue. He experienced sudden onsent precordial CP last night and presented to [facility] ED where diagnosed with STEMI. Placed IABP and txferred to [facility] for further eval/tx. Now stable without CP and conversant. Lives alone; no prior chest or abd surgery. He is a retired Engineer, water.   Current Activity/ Functional Status: Patient will be independent with mobility/ambulation, transfers, ADL's, IADL's.   Zubrod Score: At the time of surgery this patient's most appropriate activity status/level should be described as: []     0    Normal activity, no symptoms [x]     1    Restricted in physical strenuous activity but ambulatory, able to do out light work []     2    Ambulatory and capable of self care, unable to do work activities, up and about                 more than 50%  Of the time                            []     3    Only limited self care, in bed greater than 50% of waking hours []     4    Completely disabled, no self care, confined to bed or chair []     5    Moribund  Past Medical History:  Diagnosis Date  . Hepatitis B     Past Surgical History:  Procedure Laterality Date  . CORONARY/GRAFT ACUTE MI REVASCULARIZATION N/A 12/31/2019   Procedure: Coronary/Graft Acute MI Revascularization;  Surgeon: Yolonda Kida, MD;   Location: [facility];  Service: Cardiovascular;  Laterality: N/A;  . LEFT HEART CATH AND CORONARY ANGIOGRAPHY N/A 12/31/2019   Procedure: LEFT HEART CATH AND CORONARY ANGIOGRAPHY;  Surgeon: Yolonda Kida, MD;  Location: [facility];  Service: Cardiovascular;  Laterality: N/A;    Social History   Tobacco Use  Smoking Status Never Smoker  Smokeless Tobacco Never Used    Social History   Substance and Sexual Activity  Alcohol Use Not Currently     No Known Allergies  Current Facility-Administered Medications  Medication Dose Route Frequency Provider Last Rate Last Admin  . 0.9 %  sodium chloride infusion  250 mL Intravenous PRN Callwood, Dwayne D, MD      . acetaminophen (TYLENOL) tablet 650 mg  650 mg Oral Q4H PRN Callwood, Dwayne D, MD      . amiodarone (NEXTERONE PREMIX) 360-4.14 MG/200ML-% (1.8 mg/mL) IV infusion  60 mg/hr Intravenous Continuous Larey Dresser, MD 33.3 mL/hr at 12/31/19 0800 60 mg/hr at 12/31/19 0800  . amiodarone (NEXTERONE PREMIX) 360-4.14 MG/200ML-% (1.8 mg/mL) IV infusion  30  mg/hr Intravenous Continuous Larey Dresser, MD      . amiodarone (NEXTERONE PREMIX) 360-4.14 MG/200ML-% (1.8 mg/mL) IV infusion           . aspirin EC tablet 81 mg  81 mg Oral Daily Larey Dresser, MD      . bisacodyl (DULCOLAX) EC tablet 5 mg  5 mg Oral Once Wonda Olds, MD      . Derrill Memo ON 01/01/2020] chlorhexidine (PERIDEX) 0.12 % solution 15 mL  15 mL Mouth/Throat Once Yoceline Bazar, Glenice Bow, MD      . Chlorhexidine Gluconate Cloth 2 % PADS 6 each  6 each Topical Daily Larey Dresser, MD      . Chlorhexidine Gluconate Cloth 2 % PADS 6 each  6 each Topical Once Wonda Olds, MD       And  . Chlorhexidine Gluconate Cloth 2 % PADS 6 each  6 each Topical Once Laurens Matheny, Glenice Bow, MD      . heparin ADULT infusion 100 units/mL (25000 units/278mL sodium chloride 0.45%)  700 Units/hr Intravenous Continuous Erenest Blank, RPH 7 mL/hr at 12/31/19 0800 700  Units/hr at 12/31/19 0800  . hydrALAZINE (APRESOLINE) injection 10 mg  10 mg Intravenous Q20 Min PRN Callwood, Dwayne D, MD      . labetalol (NORMODYNE) injection 10 mg  10 mg Intravenous Q10 min PRN Callwood, Dwayne D, MD      . Derrill Memo ON 01/01/2020] metoprolol tartrate (LOPRESSOR) tablet 12.5 mg  12.5 mg Oral Once Nirav Sweda, Glenice Bow, MD      . nitroGLYCERIN (NITROSTAT) SL tablet 0.4 mg  0.4 mg Sublingual Q5 Min x 3 PRN Larey Dresser, MD      . norepinephrine (LEVOPHED) 16 mg in 220mL premix infusion  0-40 mcg/min Intravenous Titrated Erenest Blank, RPH      . ondansetron (ZOFRAN) injection 4 mg  4 mg Intravenous Q6H PRN Callwood, Dwayne D, MD      . sodium chloride flush (NS) 0.9 % injection 10-40 mL  10-40 mL Intracatheter Q12H Larey Dresser, MD      . sodium chloride flush (NS) 0.9 % injection 10-40 mL  10-40 mL Intracatheter PRN Larey Dresser, MD      . sodium chloride flush (NS) 0.9 % injection 3 mL  3 mL Intravenous Q12H Callwood, Dwayne D, MD      . sodium chloride flush (NS) 0.9 % injection 3 mL  3 mL Intravenous PRN Callwood, Dwayne D, MD      . temazepam (RESTORIL) capsule 15 mg  15 mg Oral Once PRN Wonda Olds, MD        No medications prior to admission.    No family history on file.   Review of Systems:   ROS A comprehensive review of systems was negative. Except for details of H & P.    Cardiac Review of Systems: Y or  [    ]= no  Chest Pain [    ]  Resting SOB [   ] Exertional SOB  [  ]  Orthopnea [  ]   Pedal Edema [   ]    Palpitations [  ] Syncope  [  ]   Presyncope [   ]  General Review of Systems: [Y] = yes [  ]=no Constitional: recent weight change [  ]; anorexia [  ]; fatigue [  ]; nausea [  ]; night sweats [  ]; fever [  ];  or chills [  ]                                                               Dental: Last Dentist visit:   Eye : blurred vision [  ]; diplopia [   ]; vision changes [  ];  Amaurosis fugax[  ]; Resp: cough [  ];  wheezing[  ];   hemoptysis[  ]; shortness of breath[  ]; paroxysmal nocturnal dyspnea[  ]; dyspnea on exertion[  ]; or orthopnea[  ];  GI:  gallstones[  ], vomiting[  ];  dysphagia[  ]; melena[  ];  hematochezia [  ]; heartburn[  ];   Hx of  Colonoscopy[  ]; GU: kidney stones [  ]; hematuria[  ];   dysuria [  ];  nocturia[  ];  history of     obstruction [  ]; urinary frequency [  ]             Skin: rash, swelling[  ];, hair loss[  ];  peripheral edema[  ];  or itching[  ]; Musculosketetal: myalgias[  ];  joint swelling[  ];  joint erythema[  ];  joint pain[  ];  back pain[  ];  Heme/Lymph: bruising[  ];  bleeding[  ];  anemia[  ];  Neuro: TIA[  ];  headaches[  ];  stroke[  ];  vertigo[  ];  seizures[  ];   paresthesias[  ];  difficulty walking[  ];  Psych:depression[  ]; anxiety[  ];  Endocrine: diabetes[  ];  thyroid dysfunction[  ];             He has not had COVID-19 vaccination;    Physical Exam: BP (!) 147/70 (BP Location: Left Arm)   Pulse 75   Temp 98.2 F (36.8 C) (Oral)   Resp (!) 23   Ht 5\' 7"  (1.702 m)   SpO2 95%   BMI 18.79 kg/m    General appearance: alert, cooperative and no distress Head: Normocephalic, without obvious abnormality, atraumatic Neck: no adenopathy, no carotid bruit, no JVD, supple, symmetrical, trachea midline and thyroid not enlarged, symmetric, no tenderness/mass/nodules Lymph nodes: Cervical, supraclavicular, and axillary nodes normal. Resp: clear to auscultation bilaterally Cardio: regular rate and rhythm and systolic murmur: early systolic 2/6, crescendo at 2nd right intercostal space GI: left inguinal hernia Genitalia: defer exam Extremities: extremities normal, atraumatic, no cyanosis or edema Neurologic: Alert and oriented X 3, normal strength and tone. Normal symmetric reflexes. Normal coordination and gait  Diagnostic Studies & Laboratory data:     Recent Radiology Findings:   CARDIAC CATHETERIZATION  Result Date: 12/31/2019  Mid LM to Dist LM  lesion is 90% stenosed.  Dist LM to Ost LAD lesion is 99% stenosed.  Mid LAD lesion is 50% stenosed.  Left ventriculogram not performed  Intra-aortic balloon pump inserted right femoral artery  Triple-lumen catheter inserted right femoral vein  Conclusion STEMI presentation High-grade left main lesion around 90% 99% ostial LAD Widely patent large circumflex Nondominant RCA Left dominant system Left ventriculogram not performed Intra-aortic balloon pump inserted for hemodynamic support Triple-lumen catheter placed for IV access Patient was placed on amiodarone Levophed heparin Patient was transferred to [facility] where [place] Dr. Marigene Ehlers n accepted the patient  in transfer   DG CHEST PORT 1 VIEW  Result Date: 12/31/2019 CLINICAL DATA:  CHF EXAM: PORTABLE CHEST 1 VIEW COMPARISON:  None. FINDINGS: Diffuse fine reticular and patchy opacities present throughout the lungs with septal thickening and indistinctness of the vascularity. Cardiac silhouette is upper limits normal for size. The aorta is calcified. The remaining cardiomediastinal contours are unremarkable. No visible effusion. No pneumothorax. Telemetry leads overlie the chest. No acute osseous or soft tissue abnormality. IMPRESSION: Features of likely interstitial and alveolar edema though superimposed infection could have a similar appearance in the appropriate clinical setting. Electronically Signed   By: Lovena Le M.D.   On: 12/31/2019 06:05     I have independently reviewed the above radiologic studies and discussed with the patient   Recent Lab Findings: Lab Results  Component Value Date   WBC 20.0 (H) 12/31/2019   HGB 12.3 (L) 12/31/2019   HCT 36.2 (L) 12/31/2019   PLT 233 12/31/2019   GLUCOSE 190 (H) 12/31/2019   CHOL 275 (H) 12/31/2019   TRIG 65 12/31/2019   HDL 75 12/31/2019   LDLCALC 187 (H) 12/31/2019   ALT 28 12/31/2019   AST 41 12/31/2019   NA 138 12/31/2019   K 4.0 12/31/2019   CL 106 12/31/2019   CREATININE 1.13  12/31/2019   BUN 27 (H) 12/31/2019   CO2 23 12/31/2019   TSH 2.539 12/31/2019   INR 1.2 12/31/2019   HGBA1C 5.2 12/31/2019      Assessment / Plan:      79 yo man in otherwise good health presented with severe LM disease and AS. Good candidate for surgery. Will complete work-up and proceed with proper timing to OR, possibly later today. Stable now on IABP.    I  spent 30 minutes counseling the patient face to face.   Pattye Meda Z. Orvan Seen, MD [phone] 12/31/2019 8:51 AM

## 2019-12-31 NOTE — Discharge Summary (Signed)
   Physician Discharge Summary      Patient ID: David Perez MRN: [id number] DOB/AGE: 81-27-1942 81 y.o.  Admit date: 12/31/2019 Discharge date: 12/31/2019  Primary Discharge Diagnosis STEMI Secondary Discharge Diagnosis STEMI cardiogenic shock hypotension atrial fibrillation  Significant Diagnostic Studies: Diagnostic cardiac cath with significant left main disease ostial LAD TIMI-3 flow large circumflex relatively free of disease left ventriculogram was not performed  Consults: None  Hospital Course: Patient admitted with STEMI presentation came directly to the cardiac Cath Lab diagnostic features suggested left main as well as ostial LAD hazy with TIMI-3 flow circumflex was free of disease in the left dominant system left ventriculogram was not performed intra-Brown pump was placed for stabilization also a triple-lumen he went into A. fib during the case amiodarone was started Levophed for blood pressure heparin for anticoagulation   Discharge Exam: Blood pressure (!) 84/63, pulse (!) 127, temperature 97.9 F (36.6 C), resp. rate (!) 26, height 5\' 7"  (1.702 m), weight 54.4 kg, SpO2 97 %.  Exam relatively benign mild hypotension Mild dyspnea no edema no significant murmurs  This SmartLink requires parameters. Parameters are variables that are added to the [facility] name to request specific information. The parameter for .curwt is the number of readings to display.  For example: .curwt[4  In this example, the SmartLink displays the last four encounter readings.   General appearance: appears stated age Neck: no adenopathy, no carotid bruit, no JVD, supple, symmetrical, trachea midline and thyroid not enlarged, symmetric, no tenderness/mass/nodules Resp: clear to auscultation bilaterally Cardio: regular rate and rhythm, S1, S2 normal, no murmur, click, rub or gallop GI: soft, non-tender; bowel sounds normal; no masses,  no organomegaly Extremities: extremities normal, atraumatic, no  cyanosis or edema Pulses: 2+ and symmetric Skin: Skin color, texture, turgor normal. No rashes or lesions Labs:   Lab Results  Component Value Date   WBC 17.2 (H) 12/31/2019   HGB 13.6 12/31/2019   HCT 40.2 12/31/2019   MCV 105.2 (H) 12/31/2019   PLT 274 12/31/2019    Recent Labs  Lab 12/31/19 0040  NA 139  K 4.2  CL 103  CO2 22  BUN 29*  CREATININE 1.18  CALCIUM 9.1  PROT 7.7  BILITOT 2.0*  ALKPHOS 65  ALT 28  AST 41  GLUCOSE 231*      Radiology: Cardiac cath with left main ostial LAD EKG: EKG diffuse ST segment depression elevation in V1 V2 and AVR sinus rhythm  FOLLOW UP PLANS AND APPOINTMENTS Transfer to [facility] for emergent coronary bypass surgery within the next 24 to 48 hours    BRING ALL MEDICATIONS WITH YOU TO FOLLOW UP APPOINTMENTS  Time spent with patient to include physician time: 2 hours Signed:  Yolonda Kida MD 12/31/2019, 3:12 AM

## 2019-12-31 NOTE — ED Triage Notes (Signed)
PT to ED via EMS from home. Went to bed approx 2 hours ago, woke up with chest "pressure". No cardiac hx. Given 1 nitro spray and 324 of aspirin enroute. PT is AO. VSS at this time.

## 2019-12-31 NOTE — Brief Op Note (Signed)
12/31/2019  2:57 PM  PATIENT:  David Perez  79 y.o. male  PRE-OPERATIVE DIAGNOSIS:  CAD AS  POST-OPERATIVE DIAGNOSIS:  CAD AS  PROCEDURE:  Procedure(s) with comments: CORONARY ARTERY BYPASS GRAFTING (CABG) TIMES THREE USING LEFT INTERNAL MAMMARY ARTERY AND LEFT GREATER SAPHENOUS LEG VEIN HARVESTED ENDOSCOPICALLY (N/A) - POSSIBLE BILATERAL IMA AORTIC VALVE REPLACEMENT (AVR) (N/A) MAZE (N/A) TRANSESOPHAGEAL ECHOCARDIOGRAM (TEE) (N/A) INDOCYANINE GREEN FLUORESCENCE IMAGING (ICG) (N/A) Clipping Of Atrial Appendage (Left)   LIMA to LAD SVG to PDA SVG to OM1  SURGEON:  Surgeon(s) and Role:    * Wonda Olds, MD - Primary  PHYSICIAN ASSISTANT:  Nicholes Rough, PA-C   ANESTHESIA:   general  EBL:  800 mL   BLOOD ADMINISTERED:none  DRAINS: ROUTINE   LOCAL MEDICATIONS USED:  NONE  SPECIMEN:  Source of Specimen:  AORTIC VALVE LEAFLETS  DISPOSITION OF SPECIMEN:  PATHOLOGY  COUNTS:  YES  DICTATION: .Dragon Dictation  PLAN OF CARE: Admit to inpatient   PATIENT DISPOSITION:  ICU - intubated and hemodynamically stable.   Delay start of Pharmacological VTE agent (>24hrs) due to surgical blood loss or risk of bleeding: yes

## 2019-12-31 NOTE — ED Notes (Signed)
Awaiting cardiologist at this time

## 2020-01-01 ENCOUNTER — Inpatient Hospital Stay (HOSPITAL_COMMUNITY): Payer: Medicare Other

## 2020-01-01 ENCOUNTER — Encounter: Payer: Self-pay | Admitting: *Deleted

## 2020-01-01 DIAGNOSIS — R570 Cardiogenic shock: Secondary | ICD-10-CM

## 2020-01-01 LAB — RENAL FUNCTION PANEL
Albumin: 3.2 g/dL — ABNORMAL LOW (ref 3.5–5.0)
Anion gap: 14 (ref 5–15)
BUN: 18 mg/dL (ref 8–23)
CO2: 22 mmol/L (ref 22–32)
Calcium: 8.3 mg/dL — ABNORMAL LOW (ref 8.9–10.3)
Chloride: 109 mmol/L (ref 98–111)
Creatinine, Ser: 1.07 mg/dL (ref 0.61–1.24)
GFR calc Af Amer: >60 mL/min (ref >60)
GFR calc non Af Amer: >60 mL/min (ref >60)
Glucose, Bld: 251 mg/dL — ABNORMAL HIGH (ref 70–99)
Phosphorus: 2.2 mg/dL — ABNORMAL LOW (ref 2.5–4.6)
Potassium: 3.2 mmol/L — ABNORMAL LOW (ref 3.5–5.1)
Sodium: 145 mmol/L (ref 135–145)

## 2020-01-01 LAB — PREPARE FRESH FROZEN PLASMA
Unit division: 0
Unit division: 0
Unit division: 0
Unit division: 0
Unit division: 0
Unit division: 0

## 2020-01-01 LAB — BASIC METABOLIC PANEL
Anion gap: 10 (ref 5–15)
Anion gap: 9 (ref 5–15)
Anion gap: 8 (ref 5–15)
BUN: 17 mg/dL (ref 8–23)
BUN: 16 mg/dL (ref 8–23)
BUN: 15 mg/dL (ref 8–23)
CO2: 27 mmol/L (ref 22–32)
CO2: 29 mmol/L (ref 22–32)
CO2: 24 mmol/L (ref 22–32)
Calcium: 8.5 mg/dL — ABNORMAL LOW (ref 8.9–10.3)
Calcium: 8.3 mg/dL — ABNORMAL LOW (ref 8.9–10.3)
Calcium: 6.7 mg/dL — ABNORMAL LOW (ref 8.9–10.3)
Chloride: 107 mmol/L (ref 98–111)
Chloride: 108 mmol/L (ref 98–111)
Chloride: 113 mmol/L — ABNORMAL HIGH (ref 98–111)
Creatinine, Ser: 1.26 mg/dL — ABNORMAL HIGH (ref 0.61–1.24)
Creatinine, Ser: 1.05 mg/dL (ref 0.61–1.24)
Creatinine, Ser: 0.83 mg/dL (ref 0.61–1.24)
GFR calc Af Amer: >60 mL/min (ref >60)
GFR calc Af Amer: >60 mL/min (ref >60)
GFR calc Af Amer: >60 mL/min (ref >60)
GFR calc non Af Amer: 54 mL/min — ABNORMAL LOW (ref >60)
GFR calc non Af Amer: >60 mL/min (ref >60)
GFR calc non Af Amer: >60 mL/min (ref >60)
Glucose, Bld: 234 mg/dL — ABNORMAL HIGH (ref 70–99)
Glucose, Bld: 150 mg/dL — ABNORMAL HIGH (ref 70–99)
Glucose, Bld: 126 mg/dL — ABNORMAL HIGH (ref 70–99)
Potassium: 3.4 mmol/L — ABNORMAL LOW (ref 3.5–5.1)
Potassium: 3.3 mmol/L — ABNORMAL LOW (ref 3.5–5.1)
Potassium: 2.8 mmol/L — ABNORMAL LOW (ref 3.5–5.1)
Sodium: 144 mmol/L (ref 135–145)
Sodium: 146 mmol/L — ABNORMAL HIGH (ref 135–145)
Sodium: 145 mmol/L (ref 135–145)

## 2020-01-01 LAB — CBC
HCT: 29.9 % — ABNORMAL LOW (ref 39.0–52.0)
HCT: 27.8 % — ABNORMAL LOW (ref 39.0–52.0)
Hemoglobin: 10.5 g/dL — ABNORMAL LOW (ref 13.0–17.0)
Hemoglobin: 9.6 g/dL — ABNORMAL LOW (ref 13.0–17.0)
MCH: 34.1 pg — ABNORMAL HIGH (ref 26.0–34.0)
MCH: 33.4 pg (ref 26.0–34.0)
MCHC: 35.1 g/dL (ref 30.0–36.0)
MCHC: 34.5 g/dL (ref 30.0–36.0)
MCV: 97.1 fL (ref 80.0–100.0)
MCV: 96.9 fL (ref 80.0–100.0)
Platelets: 66 10*3/uL — ABNORMAL LOW (ref 150–400)
Platelets: 90 10*3/uL — ABNORMAL LOW (ref 150–400)
RBC: 3.08 MIL/uL — ABNORMAL LOW (ref 4.22–5.81)
RBC: 2.87 MIL/uL — ABNORMAL LOW (ref 4.22–5.81)
RDW: 18.7 % — ABNORMAL HIGH (ref 11.5–15.5)
RDW: 18.6 % — ABNORMAL HIGH (ref 11.5–15.5)
WBC: 13.8 10*3/uL — ABNORMAL HIGH (ref 4.0–10.5)
WBC: 19.5 10*3/uL — ABNORMAL HIGH (ref 4.0–10.5)
nRBC: 1.1 % — ABNORMAL HIGH (ref 0.0–0.2)
nRBC: 0.7 % — ABNORMAL HIGH (ref 0.0–0.2)

## 2020-01-01 LAB — POCT I-STAT 7, (LYTES, BLD GAS, ICA,H+H)
Acid-Base Excess: 2 mmol/L (ref 0.0–2.0)
Acid-Base Excess: 7 mmol/L — ABNORMAL HIGH (ref 0.0–2.0)
Acid-Base Excess: 6 mmol/L — ABNORMAL HIGH (ref 0.0–2.0)
Acid-base deficit: 5 mmol/L — ABNORMAL HIGH (ref 0.0–2.0)
Bicarbonate: 20.6 mmol/L (ref 20.0–28.0)
Bicarbonate: 25.2 mmol/L (ref 20.0–28.0)
Bicarbonate: 30.5 mmol/L — ABNORMAL HIGH (ref 20.0–28.0)
Bicarbonate: 29.9 mmol/L — ABNORMAL HIGH (ref 20.0–28.0)
Calcium, Ion: 1.11 mmol/L — ABNORMAL LOW (ref 1.15–1.40)
Calcium, Ion: 1.06 mmol/L — ABNORMAL LOW (ref 1.15–1.40)
Calcium, Ion: 1.09 mmol/L — ABNORMAL LOW (ref 1.15–1.40)
Calcium, Ion: 1.01 mmol/L — ABNORMAL LOW (ref 1.15–1.40)
HCT: 23 % — ABNORMAL LOW (ref 39.0–52.0)
HCT: 26 % — ABNORMAL LOW (ref 39.0–52.0)
HCT: 29 % — ABNORMAL LOW (ref 39.0–52.0)
HCT: 30 % — ABNORMAL LOW (ref 39.0–52.0)
Hemoglobin: 7.8 g/dL — ABNORMAL LOW (ref 13.0–17.0)
Hemoglobin: 8.8 g/dL — ABNORMAL LOW (ref 13.0–17.0)
Hemoglobin: 9.9 g/dL — ABNORMAL LOW (ref 13.0–17.0)
Hemoglobin: 10.2 g/dL — ABNORMAL LOW (ref 13.0–17.0)
O2 Saturation: 95 %
O2 Saturation: 99 %
O2 Saturation: 99 %
O2 Saturation: 93 %
Patient temperature: 36.2
Patient temperature: 36.8
Patient temperature: 37
Patient temperature: 36.8
Potassium: 3 mmol/L — ABNORMAL LOW (ref 3.5–5.1)
Potassium: 3 mmol/L — ABNORMAL LOW (ref 3.5–5.1)
Potassium: 3.3 mmol/L — ABNORMAL LOW (ref 3.5–5.1)
Potassium: 6.4 mmol/L (ref 3.5–5.1)
Sodium: 150 mmol/L — ABNORMAL HIGH (ref 135–145)
Sodium: 149 mmol/L — ABNORMAL HIGH (ref 135–145)
Sodium: 148 mmol/L — ABNORMAL HIGH (ref 135–145)
Sodium: 144 mmol/L (ref 135–145)
TCO2: 22 mmol/L (ref 22–32)
TCO2: 26 mmol/L (ref 22–32)
TCO2: 32 mmol/L (ref 22–32)
TCO2: 31 mmol/L (ref 22–32)
pCO2 arterial: 35.7 mmHg (ref 32.0–48.0)
pCO2 arterial: 32.6 mmHg (ref 32.0–48.0)
pCO2 arterial: 38.6 mmHg (ref 32.0–48.0)
pCO2 arterial: 37.5 mmHg (ref 32.0–48.0)
pH, Arterial: 7.364 (ref 7.350–7.450)
pH, Arterial: 7.494 — ABNORMAL HIGH (ref 7.350–7.450)
pH, Arterial: 7.506 — ABNORMAL HIGH (ref 7.350–7.450)
pH, Arterial: 7.509 — ABNORMAL HIGH (ref 7.350–7.450)
pO2, Arterial: 77 mmHg — ABNORMAL LOW (ref 83.0–108.0)
pO2, Arterial: 103 mmHg (ref 83.0–108.0)
pO2, Arterial: 104 mmHg (ref 83.0–108.0)
pO2, Arterial: 59 mmHg — ABNORMAL LOW (ref 83.0–108.0)

## 2020-01-01 LAB — CBC WITH DIFFERENTIAL/PLATELET
Abs Immature Granulocytes: 0.12 10*3/uL — ABNORMAL HIGH (ref 0.00–0.07)
Basophils Absolute: 0 10*3/uL (ref 0.0–0.1)
Basophils Relative: 0 %
Eosinophils Absolute: 0 10*3/uL (ref 0.0–0.5)
Eosinophils Relative: 0 %
HCT: 30 % — ABNORMAL LOW (ref 39.0–52.0)
Hemoglobin: 10.1 g/dL — ABNORMAL LOW (ref 13.0–17.0)
Immature Granulocytes: 1 %
Lymphocytes Relative: 4 %
Lymphs Abs: 0.7 10*3/uL (ref 0.7–4.0)
MCH: 33.3 pg (ref 26.0–34.0)
MCHC: 33.7 g/dL (ref 30.0–36.0)
MCV: 99 fL (ref 80.0–100.0)
Monocytes Absolute: 0.9 10*3/uL (ref 0.1–1.0)
Monocytes Relative: 6 %
Neutro Abs: 13 10*3/uL — ABNORMAL HIGH (ref 1.7–7.7)
Neutrophils Relative %: 89 %
Platelets: 66 10*3/uL — ABNORMAL LOW (ref 150–400)
RBC: 3.03 MIL/uL — ABNORMAL LOW (ref 4.22–5.81)
RDW: 18.6 % — ABNORMAL HIGH (ref 11.5–15.5)
WBC: 14.7 10*3/uL — ABNORMAL HIGH (ref 4.0–10.5)
nRBC: 1.2 % — ABNORMAL HIGH (ref 0.0–0.2)

## 2020-01-01 LAB — GLUCOSE, CAPILLARY
Glucose-Capillary: 100 mg/dL — ABNORMAL HIGH (ref 70–99)
Glucose-Capillary: 103 mg/dL — ABNORMAL HIGH (ref 70–99)
Glucose-Capillary: 104 mg/dL — ABNORMAL HIGH (ref 70–99)
Glucose-Capillary: 119 mg/dL — ABNORMAL HIGH (ref 70–99)
Glucose-Capillary: 128 mg/dL — ABNORMAL HIGH (ref 70–99)
Glucose-Capillary: 133 mg/dL — ABNORMAL HIGH (ref 70–99)
Glucose-Capillary: 135 mg/dL — ABNORMAL HIGH (ref 70–99)
Glucose-Capillary: 137 mg/dL — ABNORMAL HIGH (ref 70–99)
Glucose-Capillary: 147 mg/dL — ABNORMAL HIGH (ref 70–99)
Glucose-Capillary: 149 mg/dL — ABNORMAL HIGH (ref 70–99)
Glucose-Capillary: 162 mg/dL — ABNORMAL HIGH (ref 70–99)
Glucose-Capillary: 167 mg/dL — ABNORMAL HIGH (ref 70–99)
Glucose-Capillary: 180 mg/dL — ABNORMAL HIGH (ref 70–99)
Glucose-Capillary: 188 mg/dL — ABNORMAL HIGH (ref 70–99)
Glucose-Capillary: 203 mg/dL — ABNORMAL HIGH (ref 70–99)
Glucose-Capillary: 204 mg/dL — ABNORMAL HIGH (ref 70–99)

## 2020-01-01 LAB — BPAM FFP
Blood Product Expiration Date: 202102022359
Blood Product Expiration Date: 202102022359
Blood Product Expiration Date: 202101302359
Blood Product Expiration Date: 202102022359
Blood Product Expiration Date: 202102022359
Blood Product Expiration Date: 202102022359
ISSUE DATE / TIME: 202101281639
ISSUE DATE / TIME: 202101281639
ISSUE DATE / TIME: 202101282023
ISSUE DATE / TIME: 202101282023
ISSUE DATE / TIME: 202101282053
ISSUE DATE / TIME: 202101282130
Unit Type and Rh: 6200
Unit Type and Rh: 6200
Unit Type and Rh: 5100
Unit Type and Rh: 5100
Unit Type and Rh: 6200
Unit Type and Rh: 6200

## 2020-01-01 LAB — COOXEMETRY PANEL
Carboxyhemoglobin: 1.1 % (ref 0.5–1.5)
Carboxyhemoglobin: 1.2 % (ref 0.5–1.5)
Methemoglobin: 0.8 % (ref 0.0–1.5)
Methemoglobin: 0.9 % (ref 0.0–1.5)
O2 Saturation: 99.3 %
O2 Saturation: 64.2 %
Total hemoglobin: 8.7 g/dL — ABNORMAL LOW (ref 12.0–16.0)
Total hemoglobin: 10.3 g/dL — ABNORMAL LOW (ref 12.0–16.0)

## 2020-01-01 LAB — BPAM PLATELET PHERESIS
Blood Product Expiration Date: 202101302359
ISSUE DATE / TIME: 202101281642
Unit Type and Rh: 6200

## 2020-01-01 LAB — ECHO INTRAOPERATIVE TEE
Height: 67 in
Weight: 1920 oz

## 2020-01-01 LAB — URINE CULTURE: Culture: NO GROWTH

## 2020-01-01 LAB — PREPARE PLATELET PHERESIS: Unit division: 0

## 2020-01-01 LAB — MAGNESIUM
Magnesium: 3 mg/dL — ABNORMAL HIGH (ref 1.7–2.4)
Magnesium: 2.4 mg/dL (ref 1.7–2.4)
Magnesium: 1.7 mg/dL (ref 1.7–2.4)

## 2020-01-01 MED ORDER — LEVALBUTEROL HCL 0.63 MG/3ML IN NEBU
0.6300 mg | INHALATION_SOLUTION | Freq: Four times a day (QID) | RESPIRATORY_TRACT | Status: DC
  Administered 2020-01-01 (×3): 0.63 mg via RESPIRATORY_TRACT
  Filled 2020-01-01 (×3): qty 3

## 2020-01-01 MED ORDER — POTASSIUM CHLORIDE 10 MEQ/50ML IV SOLN
10.0000 meq | INTRAVENOUS | Status: AC
  Administered 2020-01-01 (×3): 10 meq via INTRAVENOUS
  Filled 2020-01-01 (×3): qty 50

## 2020-01-01 MED ORDER — THIAMINE HCL 100 MG/ML IJ SOLN
Freq: Once | INTRAVENOUS | Status: AC
  Filled 2020-01-01: qty 1000

## 2020-01-01 MED ORDER — POTASSIUM CHLORIDE 10 MEQ/50ML IV SOLN
10.0000 meq | INTRAVENOUS | Status: AC
  Administered 2020-01-01 (×2): 10 meq via INTRAVENOUS
  Filled 2020-01-01 (×2): qty 50

## 2020-01-01 MED ORDER — LIDOCAINE HCL (PF) 1 % IJ SOLN
INTRAMUSCULAR | Status: AC
  Administered 2020-01-01: 5 mL
  Filled 2020-01-01: qty 5

## 2020-01-01 MED ORDER — POTASSIUM CHLORIDE 10 MEQ/50ML IV SOLN
10.0000 meq | INTRAVENOUS | Status: AC
  Administered 2020-01-01 (×3): 10 meq via INTRAVENOUS
  Filled 2020-01-01: qty 50

## 2020-01-01 MED ORDER — SODIUM CHLORIDE 0.9% IV SOLUTION: Freq: Once | INTRAVENOUS | Status: AC

## 2020-01-01 MED ORDER — LEVALBUTEROL HCL 0.63 MG/3ML IN NEBU
0.6300 mg | INHALATION_SOLUTION | Freq: Three times a day (TID) | RESPIRATORY_TRACT | Status: DC
  Administered 2020-01-02 – 2020-01-07 (×15): 0.63 mg via RESPIRATORY_TRACT
  Filled 2020-01-01 (×17): qty 3

## 2020-01-01 MED ORDER — METHYLPREDNISOLONE SODIUM SUCC 125 MG IJ SOLR
125.0000 mg | Freq: Four times a day (QID) | INTRAMUSCULAR | Status: AC
  Administered 2020-01-01 – 2020-01-02 (×5): 125 mg via INTRAVENOUS
  Filled 2020-01-01 (×5): qty 2

## 2020-01-01 MED ORDER — POTASSIUM CHLORIDE 20 MEQ PO PACK
20.0000 meq | PACK | Freq: Two times a day (BID) | ORAL | Status: DC
  Administered 2020-01-01 (×2): 20 meq via ORAL
  Filled 2020-01-01 (×2): qty 1

## 2020-01-01 MED ORDER — FUROSEMIDE 10 MG/ML IJ SOLN
40.0000 mg | Freq: Two times a day (BID) | INTRAMUSCULAR | Status: DC
  Administered 2020-01-01 – 2020-01-02 (×3): 40 mg via INTRAVENOUS
  Filled 2020-01-01 (×3): qty 4

## 2020-01-01 MED ORDER — ASPIRIN 300 MG RE SUPP
300.0000 mg | Freq: Every day | RECTAL | Status: DC
  Administered 2020-01-01: 300 mg via RECTAL
  Filled 2020-01-01: qty 1

## 2020-01-01 MED ORDER — ORAL CARE MOUTH RINSE
15.0000 mL | OROMUCOSAL | Status: DC
  Administered 2020-01-01 – 2020-01-02 (×9): 15 mL via OROMUCOSAL

## 2020-01-01 MED ORDER — CHLORHEXIDINE GLUCONATE 0.12% ORAL RINSE (MEDLINE KIT)
15.0000 mL | Freq: Two times a day (BID) | OROMUCOSAL | Status: DC
  Administered 2020-01-01 – 2020-01-08 (×11): 15 mL via OROMUCOSAL

## 2020-01-01 MED FILL — Bupivacaine Liposome Inj 1.3% (13.3 MG/ML): INTRAMUSCULAR | Qty: 20 | Status: AC

## 2020-01-01 MED FILL — Heparin Sodium (Porcine) Inj 1000 Unit/ML: INTRAMUSCULAR | Qty: 2.5 | Status: AC

## 2020-01-01 MED FILL — Bupivacaine HCl Preservative Free (PF) Inj 0.5%: INTRAMUSCULAR | Qty: 30 | Status: AC

## 2020-01-01 MED FILL — Heparin Sodium (Porcine) Inj 1000 Unit/ML: INTRAMUSCULAR | Qty: 30 | Status: AC

## 2020-01-01 MED FILL — Magnesium Sulfate Inj 50%: INTRAMUSCULAR | Qty: 10 | Status: AC

## 2020-01-01 MED FILL — Papaverine HCl Inj 30 MG/ML: INTRAMUSCULAR | Qty: 1 | Status: AC

## 2020-01-01 MED FILL — Electrolyte-148 Solution: INTRAVENOUS | Qty: 500 | Status: AC

## 2020-01-01 MED FILL — Potassium Chloride Inj 2 mEq/ML: INTRAVENOUS | Qty: 40 | Status: AC

## 2020-01-01 NOTE — Plan of Care (Signed)

## 2020-01-01 NOTE — Progress Notes (Addendum)
Patient ID: David Perez, male   DOB: 19-Dec-1940, 79 y.o.   MRN: [id number]     Advanced Heart Failure Rounding Note  PCP-Cardiologist: No primary care provider on file.   Subjective:    1/28: CABG (LIMA-LAD, SVG-LPDA, SVG-OM1), Maze + LA appendage clip, bioprosthetic AVR  Today, patient is intubated/sedated, IABP in place just decreased to 1:1.  Dopamine 3.5, epinephrine 5, milrinone 0.25, norepinephrine 8. Amiodarone has been restarted. I/O ++  CXR: Pulmonary edema  Swan numbers: CVP 14 PA 28/12 CI 2.3  Echo (pre-op): EF 35% (by my read), severe AS,   Objective:   Weight Range: 54.4 kg Body mass index is 18.78 kg/m.   Vital Signs:   Temp:  [95.7 F (35.4 C)-98.8 F (37.1 C)] 98.6 F (37 C) (01/29 0700) Pulse Rate:  [41-197] 89 (01/29 0700) Resp:  [0-37] 20 (01/29 0700) BP: (89-159)/(24-101) 127/80 (01/29 0700) SpO2:  [90 %-100 %] 100 % (01/29 0700) Arterial Line BP: (77-148)/(21-140) 90/24 (01/29 0700) FiO2 (%):  [50 %-70 %] 60 % (01/29 0323) Weight:  [54.4 kg] 54.4 kg (01/28 1120) Last BM Date: 12/30/19  Weight change: Filed Weights   12/31/19 1120  Weight: 54.4 kg    Intake/Output:   Intake/Output Summary (Last 24 hours) at 01/01/2020 0742 Last data filed at 01/01/2020 0700 Gross per 24 hour  Intake 8496.51 ml  Output 3390 ml  Net 5106.51 ml      Physical Exam    General:  Sedated on vent HEENT: Normal Neck: Supple. JVP 10+. Carotids 2+ bilat; no bruits. No lymphadenopathy or thyromegaly appreciated. Cor: PMI nondisplaced. Regular rate & rhythm. IABP sounds Lungs: decreased at bases.  Abdomen: Soft, nontender, nondistended. No hepatosplenomegaly. No bruits or masses. Good bowel sounds. Extremities: No cyanosis, clubbing, rash, edema Neuro: Sedated  Telemetry   NSR 80s (personally reviewed)  EKG    NSR with LBBB (personally reviewed)  Labs    CBC Recent Labs    12/31/19 0451 12/31/19 0914 01/01/20 0018 01/01/20 0018 01/01/20 0531  01/01/20 0555  WBC 20.0*   < > 14.7*  --   --  13.8*  NEUTROABS 17.1*  --  13.0*  --   --   --   HGB 12.3*   < > 10.1*   < > 8.8* 10.5*  HCT 36.2*   < > 30.0*   < > 26.0* 29.9*  MCV 104.9*   < > 99.0  --   --  97.1  PLT 233   < > 66*  --   --  66*   < > = values in this interval not displayed.   Basic Metabolic Panel Recent Labs    01/01/20 0018 01/01/20 0018 01/01/20 0531 01/01/20 0555  NA 145   < > 149* 144  K 3.2*   < > 3.0* 3.4*  CL 109  --   --  107  CO2 22  --   --  27  GLUCOSE 251*  --   --  234*  BUN 18  --   --  17  CREATININE 1.07  --   --  1.26*  CALCIUM 8.3*  --   --  8.5*  MG 3.0*  --   --  2.4  PHOS 2.2*  --   --   --    < > = values in this interval not displayed.   Liver Function Tests Recent Labs    12/31/19 0040 01/01/20 0018  AST 41  --   ALT  28  --   ALKPHOS 65  --   BILITOT 2.0*  --   PROT 7.7  --   ALBUMIN 3.9 3.2*   No results for input(s): LIPASE, AMYLASE in the last 72 hours. Cardiac Enzymes No results for input(s): CKTOTAL, CKMB, CKMBINDEX, TROPONINI in the last 72 hours.  BNP: BNP (last 3 results) No results for input(s): BNP in the last 8760 hours.  ProBNP (last 3 results) No results for input(s): PROBNP in the last 8760 hours.   D-Dimer No results for input(s): DDIMER in the last 72 hours. Hemoglobin A1C Recent Labs    12/31/19 0040  HGBA1C 5.2   Fasting Lipid Panel Recent Labs    12/31/19 0040  CHOL 275*  HDL 75  LDLCALC 187*  TRIG 65  CHOLHDL 3.7   Thyroid Function Tests Recent Labs    12/31/19 0451  TSH 2.539    Other results:   Imaging    CT CHEST WO CONTRAST  Result Date: 12/31/2019 CLINICAL DATA:  Preop for urgent CABG. EXAM: CT CHEST WITHOUT CONTRAST TECHNIQUE: Multidetector CT imaging of the chest was performed following the standard protocol without IV contrast. COMPARISON:  None. FINDINGS: Cardiovascular: Normal heart size and no pericardial effusion. There is bulky aortic valvular  calcification. Heavily calcified left main and left anterior descending vessels. Diffusely atheromatous aorta but calcified plaque mainly begins at the arch/great vessels. There is an aortic balloon pump with tip just below the isthmus. No abnormality seen along the bilateral IMA. Mediastinum/Nodes: Negative for adenopathy or mass. Lungs/Pleura: Bilateral airspace disease which is dependent, with fissure thickening and septal thickening. Bilateral airway thickening which is also mainly dependent. Small layering pleural effusions. Emphysema and hyperinflation. Upper Abdomen: Atherosclerotic calcification Musculoskeletal: Exaggerated thoracic kyphosis with multilevel ankylosis. Unremarkable sternum. IMPRESSION: 1. Atherosclerosis with heavily calcified left main and LAD. Diffuse aortic atherosclerosis with calcified plaque beginning at the arch. 2. Heavily calcified aortic valve. 3. CHF pattern. 4. Spondyloarthropathy with fused kyphosis. 5. Emphysema. 6. Aortic balloon pump in unremarkable position. Electronically Signed   By: Monte Fantasia M.D.   On: 12/31/2019 10:54   DG Chest Port 1 View  Result Date: 01/01/2020 CLINICAL DATA:  Post aortic valve replacement and left heart catheterization 12/31/2019 with CABG EXAM: PORTABLE CHEST 1 VIEW COMPARISON:  Radiograph 12/31/2019 FINDINGS: Right IJ catheter sheath through which passes a Swan-Ganz catheter terminating at the level of the main pulmonary artery. A transesophageal tube tip terminates in the left upper quadrant with the tip beyond the GE junction. Endotracheal tube tip terminates in the lower trachea, 4 cm from the carina. There are postsurgical changes from sternotomy with aortic valve replacement and multiple mediastinal clips. Atrial occlusion device is noted as well. Cardiomediastinal contours are stable from 1 day prior with a calcified aorta. There is been some increased interstitial opacity particularly within the right lung base when compared to  prior study. No visible pneumothorax. Suspect at least trace bilateral effusions. No acute osseous or soft tissue abnormality. Degenerative changes are present in the imaged spine and shoulders. IMPRESSION: 1. Increased interstitial opacity particularly within the right lung base, which may represent worsening pulmonary edema. 2. Support lines and tubes as described above. 3. Stable cardiomediastinal contours post CABG and aortic valve replacement. Electronically Signed   By: Lovena Le M.D.   On: 01/01/2020 05:57   DG Chest Port 1 View  Result Date: 12/31/2019 CLINICAL DATA:  Post aortic valve replacement, CABG EXAM: PORTABLE CHEST 1 VIEW COMPARISON:  12/31/2019 FINDINGS:  Changes of CABG and aortic valve replacement. Endotracheal tube is 4.6 cm above the carina. NG tube is in the stomach. Swan-Ganz catheter tip is in the main pulmonary artery. Heart is normal size. Bilateral airspace opacities are again noted, similar to prior study. No pneumothorax. IMPRESSION: Support devices as above. Bilateral airspace disease again noted, similar to prior study. Electronically Signed   By: Rolm Baptise M.D.   On: 12/31/2019 19:53   DG CHEST PORT 1 VIEW  Result Date: 12/31/2019 CLINICAL DATA:  Congestive heart failure. Assess balloon pump position. EXAM: PORTABLE CHEST 1 VIEW COMPARISON:  Earlier same day FINDINGS: Heart size is normal. Aortic atherosclerosis is present. Balloon pump marker is present within the descending thoracic aorta just past the aortic arch. Slight improvement an interstitial and alveolar pulmonary edema. IMPRESSION: Balloon pump marker in the proximal descending aorta just past the arch. Slight reduction in pulmonary edema. Electronically Signed   By: Nelson Chimes M.D.   On: 12/31/2019 09:01   ECHOCARDIOGRAM COMPLETE  Result Date: 12/31/2019   ECHOCARDIOGRAM REPORT   Patient Name:   AL Bontempo Date of Exam: 12/31/2019 Medical Rec #:  [id number]   Height:       67.0 in Accession #:     [id number]  Weight:       120.0 lb Date of Birth:  [birth year]   BSA:          1.63 m Patient Age:    29 years    BP:           147/70 mmHg Patient Gender: M           HR:           75 bpm. Exam Location:  Inpatient Procedure: 2D Echo Indications:    CAD Native Vessel 414.01 / I25.10  History:        Patient has no prior history of Echocardiogram examinations.                 STEMI (ST elevation myocardial infarction).  Sonographer:    Vikki Ports Turrentine Referring Phys: [place]  1. Left ventricular ejection fraction, by visual estimation, is 40 to 45%. The left ventricle has normal function. There is no left ventricular hypertrophy.  2. Elevated left ventricular end-diastolic pressure.  3. Left ventricular diastolic parameters are consistent with Grade II diastolic dysfunction (pseudonormalization).  4. The left ventricle demonstrates global hypokinesis.  5. Global right ventricle has normal systolic function.The right ventricular size is normal. No increase in right ventricular wall thickness.  6. Left atrial size was severely dilated.  7. Right atrial size was mildly dilated.  8. Moderate mitral annular calcification.  9. The mitral valve is normal in structure. Mild mitral valve regurgitation. No evidence of mitral stenosis. 10. The tricuspid valve is normal in structure. 11. The tricuspid valve is normal in structure. Tricuspid valve regurgitation is mild. 12. Aortic valve mean gradient measures 41.8 mmHg. 13. Aortic valve peak gradient measures 71.0 mmHg. 14. The aortic valve is normal in structure. Aortic valve regurgitation is not visualized. Severe aortic valve stenosis. 15. There is severe calcifcation of the aortic valve. 16. There is severe thickening of the aortic valve. 17. The pulmonic valve was normal in structure. Pulmonic valve regurgitation is not visualized. 18. Severely elevated pulmonary artery systolic pressure. 19. The tricuspid regurgitant velocity is 3.95 m/s, and  with an assumed right atrial pressure of 3 mmHg, the estimated right ventricular systolic pressure is severely elevated  at 65.4 mmHg. 20. The inferior vena cava is normal in size with greater than 50% respiratory variability, suggesting right atrial pressure of 3 mmHg. FINDINGS  Left Ventricle: Left ventricular ejection fraction, by visual estimation, is 40 to 45%. The left ventricle has normal function. The left ventricle demonstrates global hypokinesis. There is no left ventricular hypertrophy. Left ventricular diastolic parameters are consistent with Grade II diastolic dysfunction (pseudonormalization). Elevated left ventricular end-diastolic pressure. Global hypokinesis slightly worse in the apical regions. Right Ventricle: The right ventricular size is normal. No increase in right ventricular wall thickness. Global RV systolic function is has normal systolic function. The tricuspid regurgitant velocity is 3.95 m/s, and with an assumed right atrial pressure  of 3 mmHg, the estimated right ventricular systolic pressure is severely elevated at 65.4 mmHg. Left Atrium: Left atrial size was severely dilated. Right Atrium: Right atrial size was mildly dilated Pericardium: There is no evidence of pericardial effusion. Mitral Valve: The mitral valve is normal in structure. Moderate mitral annular calcification. Mild mitral valve regurgitation. No evidence of mitral valve stenosis by observation. Tricuspid Valve: The tricuspid valve is normal in structure. Tricuspid valve regurgitation is mild. Aortic Valve: The aortic valve is normal in structure.. There is severe thickening and severe calcifcation of the aortic valve. Aortic valve regurgitation is not visualized. Severe aortic stenosis is present. There is severe thickening of the aortic valve. There is severe calcifcation of the aortic valve. Aortic valve mean gradient measures 41.8 mmHg. Aortic valve peak gradient measures 71.0 mmHg. Aortic valve area, by VTI  measures 0.33 cm. Pulmonic Valve: The pulmonic valve was normal in structure. Pulmonic valve regurgitation is not visualized. Pulmonic regurgitation is not visualized. Aorta: The aortic root, ascending aorta and aortic arch are all structurally normal, with no evidence of dilitation or obstruction. Venous: The inferior vena cava is normal in size with greater than 50% respiratory variability, suggesting right atrial pressure of 3 mmHg. IAS/Shunts: No atrial level shunt detected by color flow Doppler. There is no evidence of a patent foramen ovale. No ventricular septal defect is seen or detected. There is no evidence of an atrial septal defect.  LEFT VENTRICLE PLAX 2D LVIDd:         4.50 cm  Diastology LVIDs:         3.60 cm  LV e' lateral:   6.73 cm/s LV PW:         0.90 cm  LV E/e' lateral: 17.8 LV IVS:        0.90 cm  LV e' medial:    4.62 cm/s LVOT diam:     1.70 cm  LV E/e' medial:  26.0 LV SV:         38 ml LV SV Index:   23.83 LVOT Area:     2.27 cm  RIGHT VENTRICLE RV S prime:     15.00 cm/s TAPSE (M-mode): 2.1 cm LEFT ATRIUM             Index       RIGHT ATRIUM           Index LA diam:        4.50 cm 2.76 cm/m  RA Area:     16.60 cm LA Vol (A2C):   71.0 ml 43.62 ml/m RA Volume:   48.20 ml  29.61 ml/m LA Vol (A4C):   79.8 ml 49.03 ml/m LA Biplane Vol: 78.9 ml 48.48 ml/m  AORTIC VALVE AV Area (Vmax):    0.40 cm AV  Area (Vmean):   0.43 cm AV Area (VTI):     0.33 cm AV Vmax:           421.20 cm/s AV Vmean:          305.600 cm/s AV VTI:            0.930 m AV Peak Grad:      71.0 mmHg AV Mean Grad:      41.8 mmHg LVOT Vmax:         74.20 cm/s LVOT Vmean:        57.300 cm/s LVOT VTI:          0.137 m LVOT/AV VTI ratio: 0.15  AORTA Ao Root diam: 2.70 cm MITRAL VALVE                         TRICUSPID VALVE MV Area (PHT): 5.02 cm              TR Peak grad:   62.4 mmHg MV PHT:        43.79 msec            TR Vmax:        395.00 cm/s MV Decel Time: 151 msec MR Peak grad:    88.7 mmHg           SHUNTS MR  Mean grad:    54.0 mmHg           Systemic VTI:  0.14 m MR Vmax:         471.00 cm/s         Systemic Diam: 1.70 cm MR Vmean:        349.0 cm/s MR PISA:         1.01 cm MR PISA Eff ROA: 13 mm MR PISA Radius:  0.40 cm MV E velocity: 120.00 cm/s 103 cm/s MV A velocity: 65.60 cm/s  70.3 cm/s MV E/A ratio:  1.83        1.5  Skeet Latch MD Electronically signed by Skeet Latch MD Signature Date/Time: 12/31/2019/11:43:10 AM    Final    Doppler Pre CABG  Result Date: 12/31/2019 PREOPERATIVE VASCULAR EVALUATION  Indications: Pre-CABG. Limitations: patient is on an IABP machine. Performing Technologist: Oda Cogan RDMS, RVT  Examination Guidelines: A complete evaluation includes B-mode imaging, spectral Doppler, color Doppler, and power Doppler as needed of all accessible portions of each vessel. Bilateral testing is considered an integral part of a complete examination. Limited examinations for reoccurring indications may be performed as noted.  Right Carotid Findings: +----------+--------+--------+--------+---------------------+------------------+           PSV cm/sEDV cm/sStenosisDescribe             Comments           +----------+--------+--------+--------+---------------------+------------------+ CCA Prox  98      23                                                      +----------+--------+--------+--------+---------------------+------------------+ CCA Distal99      33                                   intimal thickening +----------+--------+--------+--------+---------------------+------------------+ ICA Prox  155     58  calcific and         intimal thickening                                   irregular                               +----------+--------+--------+--------+---------------------+------------------+ ICA Mid   124     56              calcific                                 +----------+--------+--------+--------+---------------------+------------------+ ICA Distal115     47                                                      +----------+--------+--------+--------+---------------------+------------------+ ECA       167     30                                                      +----------+--------+--------+--------+---------------------+------------------+ +----------+--------+-------+--------+------------+           PSV cm/sEDV cmsDescribeArm Pressure +----------+--------+-------+--------+------------+ Subclavian               PATENT               +----------+--------+-------+--------+------------+ +---------+--------+--+--------+--+---------+ VertebralPSV cm/s34EDV cm/s17Antegrade +---------+--------+--+--------+--+---------+ Left Carotid Findings: +----------+--------+--------+--------+------------------+------------------+           PSV cm/sEDV cm/sStenosisDescribe          Comments           +----------+--------+--------+--------+------------------+------------------+ CCA Prox  87      25                                                   +----------+--------+--------+--------+------------------+------------------+ CCA Distal83      37                                intimal thickening +----------+--------+--------+--------+------------------+------------------+ ICA Prox  96      24              focal and calcific                   +----------+--------+--------+--------+------------------+------------------+ ICA Distal138     62                                                   +----------+--------+--------+--------+------------------+------------------+ ECA       290     37                                                   +----------+--------+--------+--------+------------------+------------------+ +----------+--------+--------+--------+------------+  SubclavianPSV cm/sEDV cm/sDescribeArm Pressure  +----------+--------+--------+--------+------------+                           Patent               +----------+--------+--------+--------+------------+ +---------+--------+--+--------+--+---------+ VertebralPSV cm/s92EDV cm/s36Antegrade +---------+--------+--+--------+--+---------+  Summary: Right Carotid: Spectral Doppler waveforms are altered due to IABP device in                place. The internal carotid artery is patent with heavy calcified                plaque in the proximal segment with acoustic shadowing, severity                of disease cannot be measured. Left Carotid: Spectral Doppler waveforms are altered due to IABP device in               place. The internal carotid artery is patent with focal calcified               plaque in the proximal segment, severity of disease cannot be               measured.  Electronically signed by Monica Martinez MD on 12/31/2019 at 4:18:06 PM.    Final       Medications:     Scheduled Medications: . sodium chloride   Intravenous Once  . sodium chloride   Intravenous Once  . sodium chloride   Intravenous Once  . acetaminophen  1,000 mg Oral Q6H   Or  . acetaminophen (TYLENOL) oral liquid 160 mg/5 mL  1,000 mg Per Tube Q6H  . aspirin  300 mg Rectal Daily  . bisacodyl  10 mg Oral Daily   Or  . bisacodyl  10 mg Rectal Daily  . chlorhexidine gluconate (MEDLINE KIT)  15 mL Mouth Rinse BID  . Chlorhexidine Gluconate Cloth  6 each Topical Daily  . docusate sodium  200 mg Oral Daily  . furosemide  40 mg Intravenous BID  . levalbuterol  0.63 mg Nebulization Q6H  . mouth rinse  15 mL Mouth Rinse 10 times per day  . methylPREDNISolone (SOLU-MEDROL) injection  125 mg Intravenous Q6H  . [START ON 01/02/2020] pantoprazole  40 mg Oral Daily  . potassium chloride  20 mEq Oral BID  . sodium chloride flush  3 mL Intravenous Q12H     Infusions: . sodium chloride    . sodium chloride    . sodium chloride 10 mL/hr at 12/31/19 2354  .  albumin human Stopped (01/01/20 0521)  . amiodarone 30 mg/hr (12/31/19 1100)  . cefUROXime (ZINACEF)  IV Stopped (01/01/20 0025)  . dexmedetomidine (PRECEDEX) IV infusion 0.2 mcg/kg/hr (01/01/20 0700)  . DOPamine 3.5 mcg/kg/min (01/01/20 0700)  . EPINEPHrine 4 mg in dextrose 5% 250 mL infusion (16 mcg/mL) 5 mcg/min (01/01/20 0700)  . famotidine (PEPCID) IV Stopped (01/01/20 0028)  . insulin 3.6 mL/hr at 01/01/20 0700  . lactated ringers    . lactated ringers    . lactated ringers 10 mL/hr at 01/01/20 0700  . milrinone 0.25 mcg/kg/min (01/01/20 0700)  . norepinephrine (LEVOPHED) Adult infusion 8 mcg/min (01/01/20 0238)  . banana bag IV 1000 mL       PRN Medications:  sodium chloride, albumin human, dextrose, lactated ringers, metoprolol tartrate, midazolam, morphine injection, ondansetron (ZOFRAN) IV, oxyCODONE, sodium chloride flush, traMADol   Assessment/Plan   1. CAD: STEMI with very short  LM and 99% ostial LAD. PCI thought to risk compromise of ostial large LCx, so decision made to defer PCI and transfer to [facility] for CABG as patient was CP-free at that point. Now s/p CABG with LIMA-LAD, SVG-OM2, SVG-LPDA.   - Continue ASA.  - Restart atorvastatin 80 mg daily.  2. Cardiogenic shock: Pre-op echo with EF 35%.  Now s/p CABG-AVR.  This morning, CI 2.3 on Swan with CVP 14.  He is on dopamine 3.5, epinephrine 5, milrinone 0.25, NE 8. IABP just turned to 1:2.  - Will see if we can discontinue IABP today, will get Swan number and co-ox later this morning on 1:2. Will give 1 unit plts prior.  - Keep inotropes/pressors as is for now.  - Lasix 40 mg IV bid today and replace K.  3. Atrial fibrillation: S/p Maze, LA appendage clip.  NSR on amiodarone this morning.   - Continue amiodarone gtt.  - Will need eventual anticoagulation.  4. Hyperlipidemia: Restart atorvastatin.  5. Thrombocytopenia: Post-op.  Will transfuse 1 unit plts.   6. Acute hypoxemic respiratory failure: Pulmonary edema.    - Diuresis.   CRITICAL CARE Performed by: Loralie Champagne  Total critical care time: 40 minutes  Critical care time was exclusive of separately billable procedures and treating other patients.  Critical care was necessary to treat or prevent imminent or life-threatening deterioration.  Critical care was time spent personally by me on the following activities: development of treatment plan with patient and/or surrogate as well as nursing, discussions with consultants, evaluation of patient's response to treatment, examination of patient, obtaining history from patient or surrogate, ordering and performing treatments and interventions, ordering and review of laboratory studies, ordering and review of radiographic studies, pulse oximetry and re-evaluation of patient's condition.   Length of Stay: 1  Loralie Champagne, MD  01/01/2020, 7:42 AM  Advanced Heart Failure Team Pager [phone] (M-F; 7a - 4p)  Please contact [facility] for night-coverage after hours (4p -7a ) and weekends on [email or web address]

## 2020-01-01 NOTE — Progress Notes (Signed)
Orthopedic Tech Progress Note Patient Details:  David Perez [birth year] [id number]  Ortho Devices Type of Ortho Device: Knee Immobilizer Ortho Device/Splint Location: RLE Ortho Device/Splint Interventions: Ordered, Application, Adjustment   Post Interventions Patient Tolerated: Well   Staci Righter 01/01/2020, 2:08 AM

## 2020-01-01 NOTE — Procedures (Signed)
Extubation Procedure Note  Patient Details:   Name: Wisam Fahnestock DOB: 1941/07/31 MRN: [id number]   Airway Documentation:    Vent end date: 01/01/20 Vent end time: 1520   Evaluation  O2 sats: stable throughout Complications: No apparent complications Patient did tolerate procedure well. Bilateral Breath Sounds: Clear, Diminished   Yes  RT extubated patient per MD order. Per Dr.Atkins no parameters (VC & NIF) neccessary. Positive cuff leak noted. RT extubated patient to 6L [place] with RN and MD at bedside. No stridor noted. RT will continue to monitor as needed.  Vernona Rieger 01/01/2020, 3:27 PM

## 2020-01-01 NOTE — Progress Notes (Signed)
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 USING LEFT INTERNAL MAMMARY ARTERY AND LEFT GREATER SAPHENOUS LEG VEIN HARVESTED ENDOSCOPICALLY (N/A) AORTIC VALVE REPLACEMENT (AVR) (N/A) MAZE (N/A) TRANSESOPHAGEAL ECHOCARDIOGRAM (TEE) (N/A) INDOCYANINE GREEN FLUORESCENCE IMAGING (ICG) (N/A) Clipping Of Atrial Appendage (Left) Subjective: Intubated, sedated, but awakens and moves extremities and tries to converse  Objective: Vital signs in last 24 hours: Temp:  [95.7 F (35.4 C)-98.8 F (37.1 C)] 98.2 F (36.8 C) (01/29 0845) Pulse Rate:  [41-197] 88 (01/29 0845) Cardiac Rhythm: Normal sinus rhythm (01/29 0800) Resp:  [0-37] 22 (01/29 0845) BP: (89-159)/(24-101) 150/94 (01/29 0815) SpO2:  [90 %-100 %] 97 % (01/29 0845) Arterial Line BP: (77-148)/(21-140) 100/37 (01/29 0845) FiO2 (%):  [50 %-70 %] 60 % (01/29 0800) Weight:  [54.4 kg] 54.4 kg (01/28 1120)  Hemodynamic parameters for last 24 hours: PAP: (26-44)/(13-26) 30/17 CVP:  [7 mmHg-29 mmHg] 10 mmHg CO:  [3.8 L/min-6.9 L/min] 3.8 L/min CI:  [2.3 L/min/m2-4.2 L/min/m2] 2.3 L/min/m2  Intake/Output from previous day: 01/28 0701 - 01/29 0700 In: 8496.5 [I.V.:4914.5; Blood:2155.5; IV Piggyback:1426.6] Out: 3390 [Urine:1510; Blood:800; Chest Tube:1080] Intake/Output this shift: No intake/output data recorded.  General appearance: intubated, sedated Neurologic: grossly intact when awakening Heart: regular rate and rhythm, S1, S2 normal, no murmur, click, rub or gallop Lungs: diminished breath sounds bilaterally Abdomen: soft, non-tender; bowel sounds normal; no masses,  no organomegaly Extremities: extremities normal, atraumatic, no cyanosis or edema Wound: dressed, dry  Lab Results: Recent Labs    01/01/20 0018 01/01/20 0531 01/01/20 0555 01/01/20 0834  WBC 14.7*  --  13.8*  --   HGB 10.1*   < > 10.5* 9.9*  HCT 30.0*   < > 29.9* 29.0*  PLT 66*  --  66*  --    < > = values in  this interval not displayed.   BMET:  Recent Labs    01/01/20 0018 01/01/20 0531 01/01/20 0555 01/01/20 0834  NA 145   < > 144 148*  K 3.2*   < > 3.4* 3.3*  CL 109  --  107  --   CO2 22  --  27  --   GLUCOSE 251*  --  234*  --   BUN 18  --  17  --   CREATININE 1.07  --  1.26*  --   CALCIUM 8.3*  --  8.5*  --    < > = values in this interval not displayed.    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

## 2020-01-01 NOTE — Anesthesia Postprocedure Evaluation (Signed)
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 USING LEFT INTERNAL MAMMARY ARTERY AND LEFT GREATER SAPHENOUS LEG VEIN HARVESTED ENDOSCOPICALLY (N/A Chest) AORTIC VALVE REPLACEMENT (AVR) (N/A ) MAZE (N/A ) TRANSESOPHAGEAL ECHOCARDIOGRAM (TEE) (N/A ) INDOCYANINE GREEN FLUORESCENCE IMAGING (ICG) (N/A ) Clipping Of Atrial Appendage (Left Chest)     Patient location during evaluation: SICU Anesthesia Type: General Level of consciousness: patient cooperative Pain management: pain level controlled Vital Signs Assessment: post-procedure vital signs reviewed and stable Respiratory status: patient remains intubated per anesthesia plan and patient on ventilator - see flowsheet for VS Cardiovascular status: stable Anesthetic complications: no    Last Vitals:  Vitals:   01/01/20 0830 01/01/20 0845  BP:    Pulse: 91 88  Resp: (!) 28 (!) 22  Temp: 37 C 36.8 C  SpO2: 100% 97%    Last Pain:  Vitals:   01/01/20 0812  TempSrc: Oral  PainSc:                  David Perez

## 2020-01-01 NOTE — Progress Notes (Signed)
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 Low dose drips Good uop       BP 93/66   Pulse 80   Temp 98.1 F (36.7 C)   Resp (!) 25   Ht 5\' 7"  (1.702 m)   Wt 54.4 kg   SpO2 93%   BMI 18.78 kg/m   PAP: (25-44)/(13-26) 28/13 CVP:  [7 mmHg-29 mmHg] 10 mmHg CO:  [2.9 L/min-6.9 L/min] 2.9 L/min CI:  [1.8 L/min/m2-4.2 L/min/m2] 1.8 L/min/m2  Vent Mode: CPAP;PSV FiO2 (%):  [40 %-70 %] 40 % Set Rate:  [4 bmp-20 bmp] 4 bmp Vt Set:  [520 mL] 520 mL PEEP:  [5 cmH20-8 cmH20] 5 cmH20 Pressure Support:  [10 cmH20] 10 cmH20 Plateau Pressure:  [18 cmH20-25 cmH20] 18 cmH20  . sodium chloride    . sodium chloride    . sodium chloride 10 mL/hr at 12/31/19 2354  . amiodarone 30 mg/hr (01/01/20 0810)  . cefUROXime (ZINACEF)  IV Stopped (01/01/20 1137)  . dexmedetomidine (PRECEDEX) IV infusion 0.1 mcg/kg/hr (01/01/20 1600)  . DOPamine 2.5 mcg/kg/min (01/01/20 1600)  . EPINEPHrine 4 mg in dextrose 5% 250 mL infusion (16 mcg/mL) 2 mcg/min (01/01/20 1600)  . insulin 1.3 mL/hr at 01/01/20 1600  . lactated ringers    . lactated ringers    . lactated ringers 10 mL/hr at 01/01/20 1600  . milrinone 0.25 mcg/kg/min (01/01/20 1600)  . norepinephrine (LEVOPHED) Adult infusion 8 mcg/min (01/01/20 0238)  . potassium chloride 10 mEq (01/01/20 1624)    I/O last 3 completed shifts: In: 8698.8 [I.V.:5116.7; Blood:2155.5; IV Piggyback:1426.6] Out: 3390 [Urine:1510; Blood:800; Chest  Tube:1080]   CBC Latest Ref Rng & Units 01/01/2020 01/01/2020 01/01/2020  WBC 4.0 - 10.5 K/uL 19.5(H) - -  Hemoglobin 13.0 - 17.0 g/dL 9.6(L) 10.2(L) 9.9(L)  Hematocrit 39.0 - 52.0 % 27.8(L) 30.0(L) 29.0(L)  Platelets 150 - 400 K/uL 90(L) - -    BMP Latest Ref Rng & Units 01/01/2020 01/01/2020 01/01/2020  Glucose 70 - 99 mg/dL 126(H) - 150(H)  BUN 8 - 23 mg/dL 15 - 16  Creatinine 0.61 - 1.24 mg/dL 0.83 - 1.05  Sodium 135 - 145 mmol/L 145 144 146(H)  Potassium 3.5 - 5.1 mmol/L 2.8(L) 6.4(HH) 3.3(L)  Chloride 98 - 111 mmol/L 113(H) - 108  CO2 22 - 32 mmol/L 24 - 29  Calcium 8.9 - 10.3 mg/dL 6.7(L) - 8.3(L)    ABG    Component Value Date/Time   PHART 7.509 (H) 01/01/2020 1514   PCO2ART 37.5 01/01/2020 1514   PO2ART 59.0 (L) 01/01/2020 1514   HCO3 29.9 (H) 01/01/2020 1514   TCO2 31 01/01/2020 1514   ACIDBASEDEF 5.0 (H) 12/31/2019 2230   O2SAT 93.0 01/01/2020 1514       Melodie Bouillon, MD 01/01/2020 6:02 PM

## 2020-01-02 ENCOUNTER — Inpatient Hospital Stay (HOSPITAL_COMMUNITY): Payer: Medicare Other

## 2020-01-02 LAB — COMPREHENSIVE METABOLIC PANEL
ALT: 899 U/L — ABNORMAL HIGH (ref 0–44)
AST: 759 U/L — ABNORMAL HIGH (ref 15–41)
Albumin: 3.7 g/dL (ref 3.5–5.0)
Alkaline Phosphatase: 47 U/L (ref 38–126)
Anion gap: 12 (ref 5–15)
BUN: 40 mg/dL — ABNORMAL HIGH (ref 8–23)
CO2: 28 mmol/L (ref 22–32)
Calcium: 7.8 mg/dL — ABNORMAL LOW (ref 8.9–10.3)
Chloride: 105 mmol/L (ref 98–111)
Creatinine, Ser: 1.81 mg/dL — ABNORMAL HIGH (ref 0.61–1.24)
GFR calc Af Amer: 40 mL/min — ABNORMAL LOW (ref >60)
GFR calc non Af Amer: 35 mL/min — ABNORMAL LOW (ref >60)
Glucose, Bld: 171 mg/dL — ABNORMAL HIGH (ref 70–99)
Potassium: 4.3 mmol/L (ref 3.5–5.1)
Sodium: 145 mmol/L (ref 135–145)
Total Bilirubin: 3.5 mg/dL — ABNORMAL HIGH (ref 0.3–1.2)
Total Protein: 5.7 g/dL — ABNORMAL LOW (ref 6.5–8.1)

## 2020-01-02 LAB — CBC
HCT: 30.4 % — ABNORMAL LOW (ref 39.0–52.0)
HCT: 29.9 % — ABNORMAL LOW (ref 39.0–52.0)
Hemoglobin: 10.3 g/dL — ABNORMAL LOW (ref 13.0–17.0)
Hemoglobin: 10.1 g/dL — ABNORMAL LOW (ref 13.0–17.0)
MCH: 33.7 pg (ref 26.0–34.0)
MCH: 33.8 pg (ref 26.0–34.0)
MCHC: 33.9 g/dL (ref 30.0–36.0)
MCHC: 33.8 g/dL (ref 30.0–36.0)
MCV: 99.3 fL (ref 80.0–100.0)
MCV: 100 fL (ref 80.0–100.0)
Platelets: 91 10*3/uL — ABNORMAL LOW (ref 150–400)
Platelets: 80 10*3/uL — ABNORMAL LOW (ref 150–400)
RBC: 3.06 MIL/uL — ABNORMAL LOW (ref 4.22–5.81)
RBC: 2.99 MIL/uL — ABNORMAL LOW (ref 4.22–5.81)
RDW: 19.3 % — ABNORMAL HIGH (ref 11.5–15.5)
RDW: 19.1 % — ABNORMAL HIGH (ref 11.5–15.5)
WBC: 25.3 10*3/uL — ABNORMAL HIGH (ref 4.0–10.5)
WBC: 20.6 10*3/uL — ABNORMAL HIGH (ref 4.0–10.5)
nRBC: 0.8 % — ABNORMAL HIGH (ref 0.0–0.2)
nRBC: 1.2 % — ABNORMAL HIGH (ref 0.0–0.2)

## 2020-01-02 LAB — BASIC METABOLIC PANEL
Anion gap: 10 (ref 5–15)
BUN: 26 mg/dL — ABNORMAL HIGH (ref 8–23)
CO2: 27 mmol/L (ref 22–32)
Calcium: 7.8 mg/dL — ABNORMAL LOW (ref 8.9–10.3)
Chloride: 105 mmol/L (ref 98–111)
Creatinine, Ser: 1.45 mg/dL — ABNORMAL HIGH (ref 0.61–1.24)
GFR calc Af Amer: 52 mL/min — ABNORMAL LOW (ref >60)
GFR calc non Af Amer: 45 mL/min — ABNORMAL LOW (ref >60)
Glucose, Bld: 130 mg/dL — ABNORMAL HIGH (ref 70–99)
Potassium: 5.3 mmol/L — ABNORMAL HIGH (ref 3.5–5.1)
Sodium: 142 mmol/L (ref 135–145)

## 2020-01-02 LAB — GLUCOSE, CAPILLARY
Glucose-Capillary: 102 mg/dL — ABNORMAL HIGH (ref 70–99)
Glucose-Capillary: 131 mg/dL — ABNORMAL HIGH (ref 70–99)
Glucose-Capillary: 141 mg/dL — ABNORMAL HIGH (ref 70–99)
Glucose-Capillary: 146 mg/dL — ABNORMAL HIGH (ref 70–99)
Glucose-Capillary: 149 mg/dL — ABNORMAL HIGH (ref 70–99)
Glucose-Capillary: 162 mg/dL — ABNORMAL HIGH (ref 70–99)
Glucose-Capillary: 84 mg/dL (ref 70–99)

## 2020-01-02 LAB — COOXEMETRY PANEL
Carboxyhemoglobin: 1.3 % (ref 0.5–1.5)
Carboxyhemoglobin: 0.8 % (ref 0.5–1.5)
Methemoglobin: 1.1 % (ref 0.0–1.5)
Methemoglobin: 0.7 % (ref 0.0–1.5)
O2 Saturation: 43.7 %
O2 Saturation: 80.6 %
Total hemoglobin: 8.8 g/dL — ABNORMAL LOW (ref 12.0–16.0)
Total hemoglobin: 10 g/dL — ABNORMAL LOW (ref 12.0–16.0)

## 2020-01-02 LAB — PREPARE PLATELET PHERESIS: Unit division: 0

## 2020-01-02 LAB — BPAM PLATELET PHERESIS
Blood Product Expiration Date: 202101302359
ISSUE DATE / TIME: 202101290803
Unit Type and Rh: 6200

## 2020-01-02 MED ORDER — ASPIRIN 325 MG PO TABS
ORAL_TABLET | ORAL | Status: AC
  Administered 2020-01-02: 325 mg via ORAL
  Filled 2020-01-02: qty 1

## 2020-01-02 MED ORDER — ATORVASTATIN CALCIUM 80 MG PO TABS
80.0000 mg | ORAL_TABLET | Freq: Every day | ORAL | Status: DC
  Administered 2020-01-02 – 2020-01-08 (×5): 80 mg via ORAL
  Filled 2020-01-02 (×5): qty 1

## 2020-01-02 MED ORDER — FUROSEMIDE 10 MG/ML IJ SOLN
4.0000 mg/h | INTRAVENOUS | Status: DC
  Filled 2020-01-02: qty 25

## 2020-01-02 MED ORDER — ALBUMIN HUMAN 25 % IV SOLN
12.5000 g | Freq: Once | INTRAVENOUS | Status: AC
  Administered 2020-01-02: 12.5 g via INTRAVENOUS
  Filled 2020-01-02: qty 100

## 2020-01-02 MED ORDER — MILRINONE LACTATE IN DEXTROSE 20-5 MG/100ML-% IV SOLN
0.3750 ug/kg/min | INTRAVENOUS | Status: DC
  Administered 2020-01-03 (×2): 0.375 ug/kg/min via INTRAVENOUS
  Filled 2020-01-02 (×2): qty 100

## 2020-01-02 MED ORDER — DEXMEDETOMIDINE HCL IN NACL 400 MCG/100ML IV SOLN
0.4000 ug/kg/h | INTRAVENOUS | Status: DC
  Filled 2020-01-02: qty 100

## 2020-01-02 MED ORDER — ALBUMIN HUMAN 25 % IV SOLN
12.5000 g | Freq: Once | INTRAVENOUS | Status: AC
  Administered 2020-01-02: 12.5 g via INTRAVENOUS

## 2020-01-02 MED ORDER — QUETIAPINE FUMARATE 25 MG PO TABS
25.0000 mg | ORAL_TABLET | Freq: Every day | ORAL | Status: DC
  Administered 2020-01-02: 25 mg via ORAL
  Filled 2020-01-02: qty 1

## 2020-01-02 MED ORDER — ASPIRIN 325 MG PO TABS
325.0000 mg | ORAL_TABLET | Freq: Every day | ORAL | Status: DC
  Administered 2020-01-03: 325 mg via ORAL
  Filled 2020-01-02: qty 1

## 2020-01-02 MED ORDER — ENSURE ENLIVE PO LIQD: 237.0000 mL | Freq: Two times a day (BID) | ORAL | Status: DC

## 2020-01-02 MED ORDER — INSULIN ASPART 100 UNIT/ML ~~LOC~~ SOLN
0.0000 [IU] | SUBCUTANEOUS | Status: DC
  Administered 2020-01-02: 4 [IU] via SUBCUTANEOUS
  Administered 2020-01-02 – 2020-01-04 (×7): 2 [IU] via SUBCUTANEOUS
  Administered 2020-01-05: 0 [IU] via SUBCUTANEOUS
  Administered 2020-01-06 – 2020-01-08 (×9): 2 [IU] via SUBCUTANEOUS

## 2020-01-02 MED ORDER — FUROSEMIDE 10 MG/ML IJ SOLN
8.0000 mg/h | INTRAVENOUS | Status: DC
  Administered 2020-01-02: 8 mg/h via INTRAVENOUS
  Filled 2020-01-02: qty 25

## 2020-01-02 MED ORDER — DOPAMINE-DEXTROSE 3.2-5 MG/ML-% IV SOLN
2.5000 ug/kg/min | INTRAVENOUS | Status: DC
  Administered 2020-01-02: 2.5 ug/kg/min via INTRAVENOUS

## 2020-01-02 NOTE — Plan of Care (Signed)
Patinet had quite a difficult morning, Felt he was in "Dystopian novel" daughter came in and explained to paitent what was going on, routine etc. He states he has better understanding now. Frequent reassurance that all the correct things are being done post -operative.  Nursing dx Ineffective airway exchange related to fluid status   Interventions Titrate o2 to sao2 ?92%  Monitor intake and output strictly Listen to lungs Q 4h and CVP q4  Titrate medications for minimal requirement and best vital signs Cough and deep breathe Q2 h RT to give breathing treatments.

## 2020-01-02 NOTE — Progress Notes (Signed)
Pt became progressively confused throughout shift. Pt was pulling at IV lines, yelling at staff, and refusing care. Has had increased O2 demand throughout the morning, finally requiring HFNC@15 +NRB@15  prior to placing on Bipap.  New-onset crackles auscultated this AM bilaterally, UOP has decreased throughout shift (total UOP 250cc over 12hrs). AM labs show increased K (5.3), increased creatinine (1.45), and coox of 43.7%.  Called Dr. Kipp Brood, received orders for BIPAP.  RT placed pt on BIPAP, pt tolerating well, no longer agitated at this time.  Will continue to monitor closely.

## 2020-01-02 NOTE — Procedures (Signed)
Central Venous Catheter Insertion Procedure Note Philander Shuck [id number] 1940/12/21  Procedure: Insertion of Central Venous Catheter Indications: Drug and/or fluid administration  Procedure Details Consent: Unable to obtain consent because of altered level of consciousness. Time Out: Verified patient identification, verified procedure, site/side was marked, verified correct patient position, special equipment/implants available, medications/allergies/relevent history reviewed, required imaging and test results available.  Performed  Maximum sterile technique was used including antiseptics, cap, gloves, gown, hand hygiene, mask and sheet. Skin prep: Chlorhexidine; local anesthetic administered A antimicrobial bonded/coated triple lumen catheter was placed in the left subclavian vein using the Seldinger technique.  Evaluation Blood flow good Complications: No apparent complications Patient did tolerate procedure well. Chest X-ray ordered to verify placement.  CXR: normal.  Dany Harten O Rynell Ciotti 01/02/2020, 10:01 AM

## 2020-01-02 NOTE — Progress Notes (Signed)
[address]       [place] [address]             [phone]                 2 Days Post-Op Procedure(s) (LRB): CORONARY ARTERY BYPASS GRAFTING (CABG) TIMES THREE USING LEFT INTERNAL MAMMARY ARTERY AND LEFT GREATER SAPHENOUS LEG VEIN HARVESTED ENDOSCOPICALLY (N/A) AORTIC VALVE REPLACEMENT (AVR) (N/A) MAZE (N/A) TRANSESOPHAGEAL ECHOCARDIOGRAM (TEE) (N/A) INDOCYANINE GREEN FLUORESCENCE IMAGING (ICG) (N/A) Clipping Of Atrial Appendage (Left)   Events: Confused this am.  Pulled his central line and swan Combative this am as well _______________________________________________________________ Vitals: BP (!) 99/58   Pulse 89   Temp 98.1 F (36.7 C)   Resp (!) 26   Ht 5\' 7"  (1.702 m)   Wt 68.6 kg   SpO2 100%   BMI 23.69 kg/m   - Neuro: combative.  Alert.  Oriented to person  - Cardiovascular: paced at 90. Junctional in the 70s  Drips: levo 2.  milr 0.375.   PAP: (23-50)/(10-31) 31/20 CVP:  [9 mmHg-13 mmHg] 9 mmHg CO:  [2.6 L/min-4.7 L/min] 3.6 L/min CI:  [1.6 L/min/m2-2.9 L/min/m2] 2.2 L/min/m2  - Pulm: coarse B Vent Mode: BIPAP;PCV FiO2 (%):  [40 %-60 %] 50 % Set Rate:  [4 bmp-20 bmp] 15 bmp Vt Set:  [520 mL] 520 mL PEEP:  [5 cmH20-8 cmH20] 8 cmH20 Pressure Support:  [8 cmH20-10 cmH20] 8 cmH20 Plateau Pressure:  [18 cmH20] 18 cmH20  ABG    Component Value Date/Time   PHART 7.509 (H) 01/01/2020 1514   PCO2ART 37.5 01/01/2020 1514   PO2ART 59.0 (L) 01/01/2020 1514   HCO3 29.9 (H) 01/01/2020 1514   TCO2 31 01/01/2020 1514   ACIDBASEDEF 5.0 (H) 12/31/2019 2230   O2SAT 43.7 01/02/2020 0500    - Abd: soft - Extremity: + edema  .Intake/Output      01/29 0701 - 01/30 0700 01/30 0701 - 01/31 0700   I.V. (mL/kg) 1928.4 (28.1)    Blood 868.5    IV Piggyback 399.9    Total Intake(mL/kg) 3196.7 (46.6)    Urine (mL/kg/hr) 1630 (1)    Stool 0    Blood     Chest Tube 1014    Total Output 2644    Net +552.7         Stool Occurrence 1 x         _______________________________________________________________ Labs: CBC Latest Ref Rng & Units 01/02/2020 01/01/2020 01/01/2020  WBC 4.0 - 10.5 K/uL 25.3(H) 19.5(H) -  Hemoglobin 13.0 - 17.0 g/dL 10.3(L) 9.6(L) 10.2(L)  Hematocrit 39.0 - 52.0 % 30.4(L) 27.8(L) 30.0(L)  Platelets 150 - 400 K/uL 91(L) 90(L) -   CMP Latest Ref Rng & Units 01/02/2020 01/01/2020 01/01/2020  Glucose 70 - 99 mg/dL 130(H) 126(H) -  BUN 8 - 23 mg/dL 26(H) 15 -  Creatinine 0.61 - 1.24 mg/dL 1.45(H) 0.83 -  Sodium 135 - 145 mmol/L 142 145 144  Potassium 3.5 - 5.1 mmol/L 5.3(H) 2.8(L) 6.4(HH)  Chloride 98 - 111 mmol/L 105 113(H) -  CO2 22 - 32 mmol/L 27 24 -  Calcium 8.9 - 10.3 mg/dL 7.8(L) 6.7(L) -  Total Protein 6.5 - 8.1 g/dL - - -  Total Bilirubin 0.3 - 1.2 mg/dL - - -  Alkaline Phos 38 - 126 U/L - - -  AST 15 - 41 U/L - - -  ALT 0 - 44 U/L - - -  CXR: Good positioning of cental line  _______________________________________________________________  Assessment and Plan: POD 2 s/p CABG, AVR, PVE, atriclip  Neuro: confused.  Will continue to re-orient.  Family will be at the bedside.  Restraints for now CV: CVP elevated.  Low Co-Ox.  On milr, and levo.  Would consider Echo to check RV function. Pulm: on and off bipap.  Needs pulm toilet Renal: creat slightly up.  HF starting lasix gtt GI: NPO for now due to confusion Heme: stable, despite pulling CVL ID: [email or web address] Endo: SSI Dispo: continue ICU care  Melodie Bouillon, MD 01/02/2020 10:03 AM

## 2020-01-02 NOTE — Progress Notes (Signed)
Initial Nutrition Assessment  DOCUMENTATION CODES:   Not applicable  INTERVENTION:  Ensure Enlive po BID, each supplement provides 350 kcal and 20 grams of protein  Magic cup TID with meals, each supplement provides 290 kcal and 9 grams of protein   NUTRITION DIAGNOSIS:   Increased nutrient needs related to post-op healing as evidenced by estimated needs.    GOAL:   Patient will meet greater than or equal to 90% of their needs    MONITOR:   Labs, I & O's, Supplement acceptance, PO intake, Weight trends  REASON FOR ASSESSMENT:   Malnutrition Screening Tool    ASSESSMENT:  RD working remotely.  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 Clayborn Bigness for evaluation of CABG. Patient presented to [facility] ED with severe chest pressure in the central chest, ECG showed diffuse ST depression with STE in VI and AVR and taken to cath lab emergently, post-cath pt went into atrial fibrillation with RVR, amiodarone gtt and norepinephrine gtt started and pt transferred fot [facility].  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   Diet has been advanced to [facility], no documented meals at this time. Will provide Ensure and Magic Cup supplements to aid with calorie/protein needs.  Patient currently on 10L HFNC, sats 93% I/Os: +5861 ml since admit   +552 ml x 24 hrs UOP: 1630 ml x 24 hrs  Non-pitting generalized, BLE edema per RN flowsheet.  Admit wt 119.68 lbs  Medications reviewed and include: SS novolog, Protonix Drips: Zinacef Dopamine Lasix Primacor Levo 4mcg  Labs: CBGs 149,141,102,84,103 x 24 hrs, K 5.3 (H), WBC 20.6 (H)  NUTRITION - FOCUSED PHYSICAL EXAM: Unable to complete at this time, RD working remotely.   Diet Order:   Diet Order            Diet Heart Room service appropriate? Yes with Assist; Fluid  consistency: Thin  Diet effective now              EDUCATION NEEDS:   No education needs have been identified at this time  Skin:  Skin Assessment: Skin Integrity Issues: Skin Integrity Issues:: Incisions Incisions: closed; rt leg  Last BM:  1/27  Height:   Ht Readings from Last 1 Encounters:  12/31/19 5\' 7"  (1.702 m)    Weight:   Wt Readings from Last 1 Encounters:  01/02/20 68.6 kg    Ideal Body Weight:  67.3 kg  BMI:  Body mass index is 23.69 kg/m.  Estimated Nutritional Needs:   Kcal:  1800-2000  Protein:  90-100  Fluid:  >/= 1.7 L/day   Lajuan Lines, RD, LDN Clinical Nutrition [place] Telephone [phone] After Hours/Weekend Pager: [phone]

## 2020-01-02 NOTE — Progress Notes (Signed)
On initial assessment patient knows President Barbette Or) his birth date,  And that he is in a medical facility. He states that we are trying to lill him, and he needs his daughter. He doesn't believe any of [name] who are speaking.  Called daughter on phone and he spoke to her, but soon forgot.  Left him when his eyes were closed and he was resting, Dr Kipp Brood made rounds and found patient had pulled out his PA catheter and introducer. Pressure held until hemostasis achieved. vaseline gauze and gauze applied. Dr Kipp Brood placed a left Subclavian central line. Patient hollering out "Help" tried to reassure patient. After procedure CXR done to verify line. Bathed and repositioned.  Daughter came in and she was updated. Patient now sleeping eyes closed after restarting all medications,

## 2020-01-02 NOTE — Progress Notes (Signed)
Patient ID: David Perez, male   DOB: 02-06-1941, 79 y.o.   MRN: [id number]     Advanced Heart Failure Rounding Note  PCP-Cardiologist: No primary care provider on file.   Subjective:    1/28: CABG (LIMA-LAD, SVG-LPDA, SVG-OM1), Maze + LA appendage clip, bioprosthetic AVR.  1/29: Extubated, IABP removed  Oxygen saturation dropped overnight and patient agitated.  Placed on Bipap, now back on nasal cannula.  Patient is agitated/paranoid this morning.  I/Os remained positive with Lasix 40 mg IV bid.    He remains on amiodarone 30, dopamine 2.5, milrinone 0.25, NE 2.  Off epinephrine.  He is on Precedex.   CXR: Pulmonary edema  Swan numbers: CVP 20 PA 31/20 CI 2.9 Co-ox 44%  Echo (pre-op): EF 35% (by my read), severe AS  Objective:   Weight Range: 68.6 kg Body mass index is 23.69 kg/m.   Vital Signs:   Temp:  [97.7 F (36.5 C)-98.6 F (37 C)] 98.1 F (36.7 C) (01/30 0738) Pulse Rate:  [41-101] 89 (01/30 0738) Resp:  [13-40] 26 (01/30 0738) BP: (81-148)/(49-109) 99/58 (01/30 0738) SpO2:  [77 %-100 %] 100 % (01/30 0745) Arterial Line BP: (60-163)/(33-93) 128/67 (01/30 0245) FiO2 (%):  [40 %-60 %] 50 % (01/30 0745) Weight:  [68.6 kg] 68.6 kg (01/30 0500) Last BM Date: 12/30/19  Weight change: Filed Weights   12/31/19 1120 01/02/20 0500  Weight: 54.4 kg 68.6 kg    Intake/Output:   Intake/Output Summary (Last 24 hours) at 01/02/2020 0850 Last data filed at 01/02/2020 0700 Gross per 24 hour  Intake 2834 ml  Output 2579 ml  Net 255 ml      Physical Exam    General: Awake/alert, agitated Neck: JVP 10+, no thyromegaly or thyroid nodule.  Lungs: Crackles dependently CV: Nondisplaced PMI.  Heart regular S1/S2, no S3/S4, 2/6 SEM RUSB.  No peripheral edema.   Abdomen: Soft, nontender, no hepatosplenomegaly, no distention.  Skin: Intact without lesions or rashes.  Neurologic: Awake/alert   Psych: Paranoid ideation Extremities: No clubbing or cyanosis.  HEENT:  Normal.    Telemetry   A-paced with LBBB around 80 (personally reviewed)  Labs    CBC Recent Labs    12/31/19 0451 12/31/19 0914 01/01/20 0018 01/01/20 0531 01/01/20 1635 01/02/20 0229  WBC 20.0*   < > 14.7*   < > 19.5* 25.3*  NEUTROABS 17.1*  --  13.0*  --   --   --   HGB 12.3*   < > 10.1*   < > 9.6* 10.3*  HCT 36.2*   < > 30.0*   < > 27.8* 30.4*  MCV 104.9*   < > 99.0   < > 96.9 99.3  PLT 233   < > 66*   < > 90* 91*   < > = values in this interval not displayed.   Basic Metabolic Panel Recent Labs    01/01/20 0018 01/01/20 0531 01/01/20 0555 01/01/20 0834 01/01/20 1635 01/02/20 0400  NA 145   < > 144   < > 145 142  K 3.2*   < > 3.4*   < > 2.8* 5.3*  CL 109  --  107   < > 113* 105  CO2 22  --  27   < > 24 27  GLUCOSE 251*  --  234*   < > 126* 130*  BUN 18  --  17   < > 15 26*  CREATININE 1.07  --  1.26*   < >  0.83 1.45*  CALCIUM 8.3*  --  8.5*   < > 6.7* 7.8*  MG 3.0*  --  2.4  --  1.7  --   PHOS 2.2*  --   --   --   --   --    < > = values in this interval not displayed.   Liver Function Tests Recent Labs    12/31/19 0040 01/01/20 0018  AST 41  --   ALT 28  --   ALKPHOS 65  --   BILITOT 2.0*  --   PROT 7.7  --   ALBUMIN 3.9 3.2*   No results for input(s): LIPASE, AMYLASE in the last 72 hours. Cardiac Enzymes No results for input(s): CKTOTAL, CKMB, CKMBINDEX, TROPONINI in the last 72 hours.  BNP: BNP (last 3 results) No results for input(s): BNP in the last 8760 hours.  ProBNP (last 3 results) No results for input(s): PROBNP in the last 8760 hours.   D-Dimer No results for input(s): DDIMER in the last 72 hours. Hemoglobin A1C Recent Labs    12/31/19 0040  HGBA1C 5.2   Fasting Lipid Panel Recent Labs    12/31/19 0040  CHOL 275*  HDL 75  LDLCALC 187*  TRIG 65  CHOLHDL 3.7   Thyroid Function Tests Recent Labs    12/31/19 0451  TSH 2.539    Other results:   Imaging    DG Chest Port 1 View  Result Date:  01/02/2020 CLINICAL DATA:  Patient status post open heart surgery. EXAM: PORTABLE CHEST 1 VIEW COMPARISON:  Chest radiograph 01/01/2020. FINDINGS: Right IJ approach pulmonary arterial catheter with tip projecting over the central pulmonary artery. Right chest tube in place. Monitoring leads overlie the patient. Interval extubation and removal of enteric tube. Left chest tube in place. Stable cardiac and mediastinal contours. Similar bilateral interstitial opacities. No definite pneumothorax. IMPRESSION: Similar interstitial opacities which may represent edema. Small right pleural effusion. Interval extubation and removal of enteric tube. Remaining support apparatus as above. Electronically Signed   By: Lovey Newcomer M.D.   On: 01/02/2020 07:25     Medications:     Scheduled Medications: . sodium chloride   Intravenous Once  . sodium chloride   Intravenous Once  . acetaminophen  1,000 mg Oral Q6H   Or  . acetaminophen (TYLENOL) oral liquid 160 mg/5 mL  1,000 mg Per Tube Q6H  . aspirin  300 mg Rectal Daily  . bisacodyl  10 mg Oral Daily   Or  . bisacodyl  10 mg Rectal Daily  . chlorhexidine gluconate (MEDLINE KIT)  15 mL Mouth Rinse BID  . Chlorhexidine Gluconate Cloth  6 each Topical Daily  . docusate sodium  200 mg Oral Daily  . levalbuterol  0.63 mg Nebulization TID  . mouth rinse  15 mL Mouth Rinse 10 times per day  . pantoprazole  40 mg Oral Daily  . sodium chloride flush  3 mL Intravenous Q12H    Infusions: . sodium chloride    . sodium chloride    . sodium chloride 10 mL/hr at 12/31/19 2354  . amiodarone 30 mg/hr (01/02/20 0700)  . cefUROXime (ZINACEF)  IV Stopped (01/02/20 0100)  . dexmedetomidine (PRECEDEX) IV infusion    . DOPamine 2.5 mcg/kg/min (01/02/20 0700)  . EPINEPHrine 4 mg in dextrose 5% 250 mL infusion (16 mcg/mL) Stopped (01/01/20 1904)  . furosemide (LASIX) infusion    . insulin Stopped (01/01/20 2324)  . lactated ringers    .  lactated ringers    . lactated  ringers 10 mL/hr at 01/02/20 0700  . milrinone    . norepinephrine (LEVOPHED) Adult infusion 2 mcg/min (01/02/20 0700)    PRN Medications: sodium chloride, dextrose, lactated ringers, metoprolol tartrate, midazolam, morphine injection, ondansetron (ZOFRAN) IV, oxyCODONE, sodium chloride flush, traMADol   Assessment/Plan   1. CAD: STEMI with very short LM and 99% ostial LAD. PCI thought to risk compromise of ostial large LCx, so decision made to defer PCI and transfer to [facility] for CABG as patient was CP-free at that point. Now s/p CABG with LIMA-LAD, SVG-OM2, SVG-LPDA.   - Continue ASA.  - Restart atorvastatin 80 mg daily.  2. Cardiogenic shock: Pre-op echo with EF 35%.  Now s/p CABG-AVR.  This morning, CI 2.9 on Swan but co-ox 44%.  CVP 20 (difficult to measure due to patient's agitation).  He is on dopamine 2.5, milrinone 0.25, NE 2. IABP out and epinephrine off.  - For now, will increase milrinone to 0.375 with rise in creatinine and drop in co-ox (though CI ok off Swan).  Will keep Luiz Blare today, hopefully out tomorrow. Can wean NE as able with BP.  - CVP high, pulmonary edema on CXR.  He got Lasix 40 mg IV x 1 this morning, start Lasix gtt at 8 mg/hr.  3. Atrial fibrillation: S/p Maze, LA appendage clip.  A-paced on amiodarone gtt at 30 this morning.   - Continue amiodarone gtt.  - Will need eventual anticoagulation.  4. Hyperlipidemia: Restart atorvastatin.  5. Thrombocytopenia: Post-op, stable today.  6. Acute hypoxemic respiratory failure: Pulmonary edema.  - Diuresis as above.  7. AKI: Creatinine up to 1.45.  Support MAP and increasing milrinone with low co-ox.  Needs diuresis with pulmonary edema and elevated CVP.  - Follow closely, repeat BMET in afternoon.  8. Neuro: Agitated delirium with paranoia.   - Reorient, have asked daughter to come in this morning.  - Can increase Precedex.  9. ID: WBCs up to 25, afebrile.  CXR does not show PNA.  - Will obtain blood cultures.  - Low  threshold for abx if spikes fever or further WBC rise.  - Post-op Zinacef finished today.   CRITICAL CARE Performed by: Loralie Champagne  Total critical care time: 40 minutes  Critical care time was exclusive of separately billable procedures and treating other patients.  Critical care was necessary to treat or prevent imminent or life-threatening deterioration.  Critical care was time spent personally by me on the following activities: development of treatment plan with patient and/or surrogate as well as nursing, discussions with consultants, evaluation of patient's response to treatment, examination of patient, obtaining history from patient or surrogate, ordering and performing treatments and interventions, ordering and review of laboratory studies, ordering and review of radiographic studies, pulse oximetry and re-evaluation of patient's condition.   Length of Stay: 2  Loralie Champagne, MD  01/02/2020, 8:50 AM  Advanced Heart Failure Team Pager [phone] (M-F; 7a - 4p)  Please contact [facility] for night-coverage after hours (4p -7a ) and weekends on [email or web address]

## 2020-01-02 NOTE — Progress Notes (Signed)
Patients daughter has been with him since the AM incidents. He states he now understands what is going on. He is pleasant and apologentic.  Daughter having to constantly reiterate routine, TV, medications and drips. Patient not trying to get out of bed at this time. Bed alarm on  Frequent rounding. Had sitter in for a while until family came.

## 2020-01-03 ENCOUNTER — Inpatient Hospital Stay (HOSPITAL_COMMUNITY): Payer: Medicare Other

## 2020-01-03 DIAGNOSIS — N179 Acute kidney failure, unspecified: Secondary | ICD-10-CM

## 2020-01-03 DIAGNOSIS — I2510 Atherosclerotic heart disease of native coronary artery without angina pectoris: Secondary | ICD-10-CM

## 2020-01-03 DIAGNOSIS — Z951 Presence of aortocoronary bypass graft: Secondary | ICD-10-CM

## 2020-01-03 HISTORY — DX: Atherosclerotic heart disease of native coronary artery without angina pectoris: I25.10

## 2020-01-03 LAB — COMPREHENSIVE METABOLIC PANEL
ALT: 650 U/L — ABNORMAL HIGH (ref 0–44)
AST: 457 U/L — ABNORMAL HIGH (ref 15–41)
Albumin: 3.1 g/dL — ABNORMAL LOW (ref 3.5–5.0)
Alkaline Phosphatase: 67 U/L (ref 38–126)
Anion gap: 13 (ref 5–15)
BUN: 51 mg/dL — ABNORMAL HIGH (ref 8–23)
CO2: 28 mmol/L (ref 22–32)
Calcium: 8 mg/dL — ABNORMAL LOW (ref 8.9–10.3)
Chloride: 104 mmol/L (ref 98–111)
Creatinine, Ser: 2 mg/dL — ABNORMAL HIGH (ref 0.61–1.24)
GFR calc Af Amer: 35 mL/min — ABNORMAL LOW (ref >60)
GFR calc non Af Amer: 31 mL/min — ABNORMAL LOW (ref >60)
Glucose, Bld: 163 mg/dL — ABNORMAL HIGH (ref 70–99)
Potassium: 4.3 mmol/L (ref 3.5–5.1)
Sodium: 145 mmol/L (ref 135–145)
Total Bilirubin: 3.4 mg/dL — ABNORMAL HIGH (ref 0.3–1.2)
Total Protein: 4.9 g/dL — ABNORMAL LOW (ref 6.5–8.1)

## 2020-01-03 LAB — CBC
HCT: 25 % — ABNORMAL LOW (ref 39.0–52.0)
Hemoglobin: 8.4 g/dL — ABNORMAL LOW (ref 13.0–17.0)
MCH: 34.1 pg — ABNORMAL HIGH (ref 26.0–34.0)
MCHC: 33.6 g/dL (ref 30.0–36.0)
MCV: 101.6 fL — ABNORMAL HIGH (ref 80.0–100.0)
Platelets: 88 10*3/uL — ABNORMAL LOW (ref 150–400)
RBC: 2.46 MIL/uL — ABNORMAL LOW (ref 4.22–5.81)
RDW: 18.6 % — ABNORMAL HIGH (ref 11.5–15.5)
WBC: 19.1 10*3/uL — ABNORMAL HIGH (ref 4.0–10.5)
nRBC: 1.5 % — ABNORMAL HIGH (ref 0.0–0.2)

## 2020-01-03 LAB — PREPARE RBC (CROSSMATCH)

## 2020-01-03 LAB — GLUCOSE, CAPILLARY
Glucose-Capillary: 123 mg/dL — ABNORMAL HIGH (ref 70–99)
Glucose-Capillary: 128 mg/dL — ABNORMAL HIGH (ref 70–99)
Glucose-Capillary: 133 mg/dL — ABNORMAL HIGH (ref 70–99)
Glucose-Capillary: 136 mg/dL — ABNORMAL HIGH (ref 70–99)
Glucose-Capillary: 140 mg/dL — ABNORMAL HIGH (ref 70–99)
Glucose-Capillary: 144 mg/dL — ABNORMAL HIGH (ref 70–99)

## 2020-01-03 LAB — COOXEMETRY PANEL
Carboxyhemoglobin: 1.4 % (ref 0.5–1.5)
Methemoglobin: 1.5 % (ref 0.0–1.5)
O2 Saturation: 71.8 %
Total hemoglobin: 8.3 g/dL — ABNORMAL LOW (ref 12.0–16.0)

## 2020-01-03 MED ORDER — SODIUM CHLORIDE 0.9 % IV SOLN
1.0000 g | Freq: Two times a day (BID) | INTRAVENOUS | Status: DC
  Administered 2020-01-03 – 2020-01-04 (×2): 1 g via INTRAVENOUS
  Filled 2020-01-03 (×3): qty 1

## 2020-01-03 MED ORDER — MILRINONE LACTATE IN DEXTROSE 20-5 MG/100ML-% IV SOLN
0.1250 ug/kg/min | INTRAVENOUS | Status: DC
  Administered 2020-01-04: 0.25 ug/kg/min via INTRAVENOUS
  Administered 2020-01-05 – 2020-01-06 (×2): 0.125 ug/kg/min via INTRAVENOUS
  Filled 2020-01-03 (×3): qty 100

## 2020-01-03 MED ORDER — LORAZEPAM 2 MG/ML IJ SOLN
INTRAMUSCULAR | Status: AC
  Administered 2020-01-03: 1 mg via INTRAVENOUS
  Filled 2020-01-03: qty 1

## 2020-01-03 MED ORDER — LORAZEPAM 2 MG/ML IJ SOLN: 1.0000 mg | Freq: Once | INTRAMUSCULAR | Status: AC

## 2020-01-03 MED ORDER — PANTOPRAZOLE SODIUM 40 MG IV SOLR
40.0000 mg | INTRAVENOUS | Status: DC
  Administered 2020-01-03 – 2020-01-07 (×5): 40 mg via INTRAVENOUS
  Filled 2020-01-03 (×6): qty 40

## 2020-01-03 MED ORDER — ASPIRIN 300 MG RE SUPP
300.0000 mg | Freq: Every day | RECTAL | Status: DC
  Administered 2020-01-04: 12:00:00 300 mg via RECTAL
  Filled 2020-01-03: qty 1

## 2020-01-03 MED ORDER — AMIODARONE HCL 200 MG PO TABS
200.0000 mg | ORAL_TABLET | Freq: Two times a day (BID) | ORAL | Status: DC
  Administered 2020-01-03: 200 mg via ORAL
  Filled 2020-01-03: qty 1

## 2020-01-03 MED ORDER — SODIUM CHLORIDE 0.9% IV SOLUTION: Freq: Once | INTRAVENOUS | Status: DC

## 2020-01-03 NOTE — Progress Notes (Signed)
Patient ID: David Perez, male   DOB: [birth year], 79 y.o.   MRN: [id number]     Advanced Heart Failure Rounding Note  PCP-Cardiologist: No primary care provider on file.   Subjective:    1/28: CABG (LIMA-LAD, SVG-LPDA, SVG-OM1), Maze + LA appendage clip, bioprosthetic AVR.  1/29: Extubated, IABP removed  Agitated during the day yesterday, pulled out his swan.  Daughter able to calm and re-orient.  Overnight, again became agitated and currently sedated on Ativan.      He remains on dopamine 2.5, milrinone 0.25, NE 6.  Off epinephrine.  He is on Precedex.   Co-ox 72% this morning with CVP 10-11. He is on Lasix gtt at 4.  Creatinine 1.8 => 2.  UOP not vigorous but may not all be recorded, weight down 1 lb. He remains on HFNC 8 L/min.   LFTs trending down.   Hgb 10 => 8.4, plts trending up at 88K.  WBCs down to 19, afebrile.   Echo (pre-op): EF 35% (by my read), severe AS  Objective:   Weight Range: 68.4 kg Body mass index is 23.62 kg/m.   Vital Signs:   Temp:  [97.5 F (36.4 C)-98.4 F (36.9 C)] 98.3 F (36.8 C) (01/31 0801) Pulse Rate:  [36-101] 93 (01/31 0700) Resp:  [11-37] 14 (01/31 0700) BP: (81-137)/(42-77) 137/67 (01/31 0700) SpO2:  [68 %-100 %] 90 % (01/31 0700) Weight:  [68.4 kg] 68.4 kg (01/31 0458) Last BM Date: 01/02/20  Weight change: Filed Weights   12/31/19 1120 01/02/20 0500 01/03/20 0458  Weight: 54.4 kg 68.6 kg 68.4 kg    Intake/Output:   Intake/Output Summary (Last 24 hours) at 01/03/2020 0808 Last data filed at 01/03/2020 0700 Gross per 24 hour  Intake 2090.94 ml  Output 1715 ml  Net 375.94 ml      Physical Exam    General: sedated/sleeping Neck: JVP 8-9 cm, no thyromegaly or thyroid nodule.  Lungs: Mild crackles dependently. CV: Nondisplaced PMI.  Heart regular S1/S2, no S3/S4, 1/6 SEM RUSB.  No peripheral edema.   Abdomen: Soft, nontender, no hepatosplenomegaly, no distention.  Skin: Intact without lesions or rashes.  Neurologic: Alert  and oriented x 3.  Psych: Normal affect. Extremities: No clubbing or cyanosis.  HEENT: Normal.    Telemetry   A-paced with LBBB around 80 (personally reviewed)  Labs    CBC Recent Labs    01/01/20 0018 01/01/20 0531 01/02/20 0952 01/03/20 0443  WBC 14.7*   < > 20.6* 19.1*  NEUTROABS 13.0*  --   --   --   HGB 10.1*   < > 10.1* 8.4*  HCT 30.0*   < > 29.9* 25.0*  MCV 99.0   < > 100.0 101.6*  PLT 66*   < > 80* 88*   < > = values in this interval not displayed.   Basic Metabolic Panel Recent Labs    01/01/20 0018 01/01/20 0531 01/01/20 0555 01/01/20 0834 01/01/20 1635 01/02/20 0400 01/02/20 1651 01/03/20 0443  NA 145   < > 144   < > 145   < > 145 145  K 3.2*   < > 3.4*   < > 2.8*   < > 4.3 4.3  CL 109  --  107   < > 113*   < > 105 104  CO2 22  --  27   < > 24   < > 28 28  GLUCOSE 251*  --  234*   < > 126*   < >  171* 163*  BUN 18  --  17   < > 15   < > 40* 51*  CREATININE 1.07  --  1.26*   < > 0.83   < > 1.81* 2.00*  CALCIUM 8.3*  --  8.5*   < > 6.7*   < > 7.8* 8.0*  MG 3.0*  --  2.4  --  1.7  --   --   --   PHOS 2.2*  --   --   --   --   --   --   --    < > = values in this interval not displayed.   Liver Function Tests Recent Labs    01/02/20 1651 01/03/20 0443  AST 759* 457*  ALT 899* 650*  ALKPHOS 47 67  BILITOT 3.5* 3.4*  PROT 5.7* 4.9*  ALBUMIN 3.7 3.1*   No results for input(s): LIPASE, AMYLASE in the last 72 hours. Cardiac Enzymes No results for input(s): CKTOTAL, CKMB, CKMBINDEX, TROPONINI in the last 72 hours.  BNP: BNP (last 3 results) No results for input(s): BNP in the last 8760 hours.  ProBNP (last 3 results) No results for input(s): PROBNP in the last 8760 hours.   D-Dimer No results for input(s): DDIMER in the last 72 hours. Hemoglobin A1C No results for input(s): HGBA1C in the last 72 hours. Fasting Lipid Panel No results for input(s): CHOL, HDL, LDLCALC, TRIG, CHOLHDL, LDLDIRECT in the last 72 hours. Thyroid Function  Tests No results for input(s): TSH, T4TOTAL, T3FREE, THYROIDAB in the last 72 hours.  Invalid input(s): FREET3  Other results:   Imaging    DG CHEST PORT 1 VIEW  Result Date: 01/02/2020 CLINICAL DATA:  Central line placement EXAM: PORTABLE CHEST 1 VIEW COMPARISON:  Chest radiograph 01/02/2020 FINDINGS: Monitoring leads overlie the patient. Bilateral chest tubes are in place. Interval insertion left subclavian central venous catheter with tip projecting over the superior vena cava. Interval removal right IJ sheath and PA catheter. Similar-appearing patchy areas of consolidation. Small right pleural effusion. No definite pneumothorax. IMPRESSION: Interval insertion left subclavian central venous catheter with tip projecting over the superior vena cava. Similar bilateral opacities which may represent edema. Small right pleural effusion. Interval removal PA catheter and right IJ sheath. Electronically Signed   By: Lovey Newcomer M.D.   On: 01/02/2020 10:09     Medications:     Scheduled Medications: . sodium chloride   Intravenous Once  . sodium chloride   Intravenous Once  . sodium chloride   Intravenous Once  . acetaminophen  1,000 mg Oral Q6H   Or  . acetaminophen (TYLENOL) oral liquid 160 mg/5 mL  1,000 mg Per Tube Q6H  . aspirin  325 mg Oral Daily  . atorvastatin  80 mg Oral q1800  . bisacodyl  10 mg Oral Daily   Or  . bisacodyl  10 mg Rectal Daily  . chlorhexidine gluconate (MEDLINE KIT)  15 mL Mouth Rinse BID  . Chlorhexidine Gluconate Cloth  6 each Topical Daily  . docusate sodium  200 mg Oral Daily  . feeding supplement (ENSURE ENLIVE)  237 mL Oral BID BM  . insulin aspart  0-24 Units Subcutaneous Q4H  . levalbuterol  0.63 mg Nebulization TID  . pantoprazole  40 mg Oral Daily  . QUEtiapine  25 mg Oral QHS  . sodium chloride flush  3 mL Intravenous Q12H    Infusions: . sodium chloride    . sodium chloride    .  sodium chloride 10 mL/hr at 12/31/19 2354  . DOPamine 2.5  mcg/kg/min (01/03/20 0700)  . EPINEPHrine 4 mg in dextrose 5% 250 mL infusion (16 mcg/mL) Stopped (01/01/20 1904)  . furosemide (LASIX) infusion 4 mg/hr (01/02/20 1237)  . insulin Stopped (01/01/20 2324)  . lactated ringers    . lactated ringers    . lactated ringers Stopped (01/02/20 1521)  . milrinone 0.375 mcg/kg/min (01/03/20 0700)  . norepinephrine (LEVOPHED) Adult infusion 6 mcg/min (01/03/20 0700)    PRN Medications: sodium chloride, dextrose, lactated ringers, metoprolol tartrate, ondansetron (ZOFRAN) IV, oxyCODONE, sodium chloride flush   Assessment/Plan   1. CAD: STEMI with very short LM and 99% ostial LAD. PCI thought to risk compromise of ostial large LCx, so decision made to defer PCI and transfer to [facility] for CABG as patient was CP-free at that point. Now s/p CABG with LIMA-LAD, SVG-OM2, SVG-LPDA.   - Continue ASA.  - Continue atorvastatin 80 mg daily.  2. Cardiogenic shock: Pre-op echo with EF 35%.  Now s/p CABG-AVR.  He is on dopamine 2.5, milrinone 0.375, NE 6. IABP out and epinephrine off. CXR from yesterday with pulmonary edema, CVP 10-11 this morning with creatinine up to 2.  Co-ox 76%. He remains on HFNC.  - For now, continue current dopamine (with AKI) and milrinone.  - Can wean down norepinephrine as tolerated.  - CXR this morning - Would continue Lasix 4 mg/hr to attempt gentle diuresis, will not push hard with AKI.   3. Atrial fibrillation: S/p Maze, LA appendage clip.  A-paced this morning.   - Amiodarone gtt is off, will start amiodarone 200 mg bid.   - Will need eventual anticoagulation.  4. Hyperlipidemia: atorvastatin.  5. Thrombocytopenia: Post-op, trending up.  6. Acute hypoxemic respiratory failure: Pulmonary edema.  - Gentle diuresis as above.  7. AKI: Creatinine up to 2.  Support MAP and cardiac output 8. Neuro: Agitated delirium with paranoia.  Daughter is able to re-orient.  - Continue Precedex.  - Daughter to come in later today.  9. ID: WBCs  25 => 19, afebrile.  CXR does not show PNA.  - Cultures NGTD  - Low threshold for abx if spikes fever or further WBC rise.  10. Anemia: Post-op, written for 1 unit PRBCs today.  37. Mobilize, up to chair when he is awake.  Preferably with daughter present.   CRITICAL CARE Performed by: Loralie Champagne  Total critical care time: 35 minutes  Critical care time was exclusive of separately billable procedures and treating other patients.  Critical care was necessary to treat or prevent imminent or life-threatening deterioration.  Critical care was time spent personally by me on the following activities: development of treatment plan with patient and/or surrogate as well as nursing, discussions with consultants, evaluation of patient's response to treatment, examination of patient, obtaining history from patient or surrogate, ordering and performing treatments and interventions, ordering and review of laboratory studies, ordering and review of radiographic studies, pulse oximetry and re-evaluation of patient's condition.   Length of Stay: 3  Loralie Champagne, MD  01/03/2020, 8:08 AM  Advanced Heart Failure Team Pager [phone] (M-F; 7a - 4p)  Please contact [facility] for night-coverage after hours (4p -7a ) and weekends on [email or web address]

## 2020-01-03 NOTE — Progress Notes (Signed)
Modified Barium Swallow Progress Note  Patient Details  Name: David Perez MRN: [id number] Date of Birth: 05-27-1941  Today's Date: 01/03/2020  Modified Barium Swallow completed.  Full report located under Chart Review in the Imaging Section.  Brief recommendations include the following:  Clinical Impression  Pt prsents with severe oropharyngeal dysphagia c/b reduced bolus cohesion, delayed swallow initiation, reduced base of tongue retraction, incomplete pharyngeal stripping wave, decreased hyolaryngeal elevation and excusion, incomplete laryngeal closure and diminished sensation.  These deficits resulted in aspiration of thin, nectar, and honey thick liquids with inconsistent cough response.  Thin and nectar thick liquid were aspirated before/during swallow.  There was piecemeal delgutition with increasing aspiration on subsequent swallows.  Reflexive cough was not sufficient to clear aspiration or penetration.  Pt was unable to follow directions for cued cough.  Honey thick liquid was penetrated during the swallow with aspiration of penetrant after the swallow.  Following honey thick liquid with heavier puree bolus did not remove penetration and resulted in aspiration of remaining honey thick penetrant during subsequent swallow.  There was penetration of puree above the level of the vocal folds during the swallow, which did not fully clear.  Pt is a poor candidate for swallowing therapy at this time 2/2 inability to consistently follow directions.  Pt was sedated overnight 2/2 agitation and has been drowsy today.  As mental status and level of arousal improve, pt will likely be able to participate in trial swallow therapy.  Suspect dysphagia to be acute nature (as pt was independent with no prior dysphagia hx PTA) and related to recent cardiac surgery with possible involvement of RLN.  Pt was briefly intubated just over 24 hours, which may also be impacting swallow.    Recommend pt be made NPO at this  time with short term alternate means of nutrition, hydration, and medication.    The results of this study were reviewed with family in room following evaluation.  Daughter viewed imaging.  Discussed alternate means of nutrition, possible causes of dysphagia, risk of aspiration pneumonia, dysphagia management and treatment plan.  Daughter in agreement.   Swallow Evaluation Recommendations       SLP Diet Recommendations: NPO;Alternative means - temporary       Medication Administration: Via alternative means               Oral Care Recommendations: Oral care QID        Celedonio Savage, MA, [place] Office: [phone]  01/03/2020,4:50 PM

## 2020-01-03 NOTE — Progress Notes (Signed)
Down to radiology for barium sw2allow modified with telmetry, saturations, o2 at 8L full tank. Moved over to chair for testing. Patient awake and following commands during testing. Still sleepy/groggy responding with one word answers. Still has equal grips bilaterally/ equal foot strength. SLP spoke to daughter post about tests. Patient NPO. Mouth cleaned out. Dr Orvan Seen aware of result and orders received.

## 2020-01-03 NOTE — Progress Notes (Signed)
Awoke for lunch still drowsy, fed patient very carefully daughter assisting, nurse observing. Patient did cough at times with liquids. Lungs still clear saturations unchanged. Phlegm produced. Encouraged chin to chest when swallowing.  Will get speech evaluation for recommendations

## 2020-01-03 NOTE — Progress Notes (Signed)
SLP in to evaluate patient. Recommended mod swallow. Dr Orvan Seen called and updated .

## 2020-01-03 NOTE — Evaluation (Signed)
Clinical/Bedside Swallow Evaluation Patient Details  Name: Stephens Vanrooyen MRN: [id number] Date of Birth: 26-May-1941  Today's Date: 01/03/2020 Time: SLP Start Time (ACUTE ONLY): 1414 SLP Stop Time (ACUTE ONLY): 1429 SLP Time Calculation (min) (ACUTE ONLY): 15 min  Past Medical History:  Past Medical History:  Diagnosis Date  . Hepatitis B    Past Surgical History:  Past Surgical History:  Procedure Laterality Date  . 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 CLIPPING OF ATRIAL APPENDAGE Left 12/31/2019   Procedure: Clipping Of Atrial Appendage;  Surgeon: Wonda Olds, MD;  Location: [place];  Service: Open Heart Surgery;  Laterality: Left;  . CORONARY ARTERY BYPASS GRAFT N/A 12/31/2019   Procedure: CORONARY ARTERY BYPASS GRAFTING (CABG) TIMES THREE USING LEFT INTERNAL MAMMARY ARTERY AND LEFT GREATER SAPHENOUS LEG VEIN HARVESTED ENDOSCOPICALLY;  Surgeon: Wonda Olds, MD;  Location: [place];  Service: Open Heart Surgery;  Laterality: N/A;  POSSIBLE BILATERAL IMA  . CORONARY/GRAFT ACUTE MI REVASCULARIZATION N/A 12/31/2019   Procedure: Coronary/Graft Acute MI Revascularization;  Surgeon: Yolonda Kida, MD;  Location: [place] [facility];  Service: Cardiovascular;  Laterality: N/A;  . LEFT HEART CATH AND CORONARY ANGIOGRAPHY N/A 12/31/2019   Procedure: LEFT HEART CATH AND CORONARY ANGIOGRAPHY;  Surgeon: Yolonda Kida, MD;  Location: [place] [facility];  Service: Cardiovascular;  Laterality: N/A;  . MAZE N/A 12/31/2019   Procedure: MAZE;  Surgeon: Wonda Olds, MD;  Location: [place];  Service: Open Heart Surgery;  Laterality: N/A;  . TEE WITHOUT CARDIOVERSION N/A 12/31/2019   Procedure: TRANSESOPHAGEAL ECHOCARDIOGRAM (TEE);  Surgeon: Wonda Olds, MD;  Location: [place];  Service: Open Heart Surgery;  Laterality: N/A;   HPI:  79 y.o.  with minimal past history was transferred by Dr. Clayborn Bigness from [facility] tonight for evaluation for CABG.  He has done well in general, remains very active, lives alone in [place], and has been taking no medications.  No history of cardiac problems. Pt admitted for STEMI following pressure in chest 1/27, now s/p CABG. Intubated approx 26 hours for procedure and following.  CXR 1/31 showing B opacities consistent with pulmonary edema.   Assessment / Plan / Recommendation Clinical Impression  Pt presents with clinical indicators of pharyngeal dysphagia.  Pt tolerated ice chip and small amount of thin liquid by spoon with no overt s/s of aspiration.  With thin liquid by cup and straw there was immediate coughing noted.  Anterior spillage observed from R side with cup sip.  Pt exhibited prolonged oral phase with all consistencies which increased with advaced textures.  Puree was given to help moisten and clear regular solid after extremely prolonged ora phase.  Pt did acheive adequate oral clearance.   Recommend instrumental swallow study to further assess pharyngeal swallow function.  Will plan MBSS for later this date.  SLP Visit Diagnosis: Dysphagia, unspecified (R13.10)    Aspiration Risk  Moderate aspiration risk    Diet Recommendation NPO(pending MBSS)        Other  Recommendations     Follow up Recommendations (TBD)      Frequency and Duration (TBD)          Prognosis   TBD     Swallow Study   General Date of Onset: 12/30/19 HPI: 79 y.o. with minimal past history was transferred by Dr. Clayborn Bigness from  [place] tonight for evaluation for CABG.  He has done well in general, remains very active, lives alone in [place], and has been taking no medications.  No history of cardiac problems. Pt admitted for STEMI following pressure in chest 1/27, now s/p CABG. Intubated approx 26 hours for procedure and following.  CXR 1/31 showing B opacities consistent with pulmonary edema. Type of Study:  Bedside Swallow Evaluation Previous Swallow Assessment: None Diet Prior to this Study: Regular Temperature Spikes Noted: No Respiratory Status: Nasal cannula History of Recent Intubation: Yes Length of Intubations (days): 1 days Date extubated: 01/01/20 Behavior/Cognition: Lethargic/Drowsy;Requires cueing Oral Cavity Assessment: Within Functional Limits Oral Cavity - Dentition: Dentures, top;Edentulous(bottom dentures not available) Self-Feeding Abilities: Total assist Patient Positioning: Upright in bed Baseline Vocal Quality: Not observed Volitional Cough: Cognitively unable to elicit Volitional Swallow: Unable to elicit    Oral/Motor/Sensory Function Overall Oral Motor/Sensory Function: (unable to assess)   Ice Chips Ice chips: Impaired Oral Phase Functional Implications: Oral holding;Prolonged oral transit Pharyngeal Phase Impairments: Suspected delayed Swallow   Thin Liquid Thin Liquid: Impaired Presentation: Cup;Spoon;Straw Oral Phase Functional Implications: Prolonged oral transit;Right anterior spillage Pharyngeal  Phase Impairments: Cough - Immediate;Decreased hyoid-laryngeal movement;Suspected delayed Swallow    Nectar Thick Nectar Thick Liquid: Not tested   Honey Thick Honey Thick Liquid: Not tested   Puree Puree: Impaired Presentation: Spoon Pharyngeal Phase Impairments: Throat Clearing - Delayed   Solid     Solid: Impaired Oral Phase Impairments: Poor awareness of bolus Oral Phase Functional Implications: Prolonged oral transit;Oral holding;Impaired mastication      Celedonio Savage, MA, [place] Office: [phone] 01/03/2020,3:09 PM

## 2020-01-03 NOTE — Progress Notes (Signed)
Patient became agitated and aggressive during routine care claiming this nurse had broken into his house and was "trying to hurt him". Pt. was informed that he was in the hospital and had open heart surgery. Pt remained combative, refusing care: wearing HFNC ( O2 sats dropped to 70's), pulling at IV lines, foley, chest tubes, etc. Multiple staff attempted to calm and reorient patient. Called pt's daughter [name] on phone to try and calm Pt down. Daughter was unable to reorient patient. Called Dr. Kipp Brood for consult. Orders received and executed. Pt resting comfortably.

## 2020-01-03 NOTE — Progress Notes (Signed)
Pt awakened pulling at lines and cords, took himself off our monitor. Pt keeps repeating that he is "in a movie and tired of watching it." In the movie he recalls having heart surgery and he has seen it twice now and "just to go home." (Patient is POD3 but has been extubated for two days now).   Pt is able to recall that he is in the hospital and had a heart attack. Enforced that because he had a heart attack, it is important we keep his heart monitor on to make sure his heart is working appropriately. Pt was amendable to this plan of care and was able to calm down. Ensured patient we would get him home when he is medically ready to do so.   Pt's RN gave medication per MD to help patient relax and stop interfering with our medical interventions.

## 2020-01-03 NOTE — Progress Notes (Signed)
When po pills given, crushed them in applesauce, patient prefers this, as he normally has a difficult time with whole pills.tolerated well without coughing.

## 2020-01-03 NOTE — Progress Notes (Signed)
David Perez       David Perez [id number]             [phone]                 3 Days Post-Op Procedure(s) (LRB): CORONARY ARTERY BYPASS GRAFTING (CABG) TIMES THREE USING LEFT INTERNAL MAMMARY ARTERY AND LEFT GREATER SAPHENOUS LEG VEIN HARVESTED ENDOSCOPICALLY (N/A) AORTIC VALVE REPLACEMENT (AVR) (N/A) MAZE (N/A) TRANSESOPHAGEAL ECHOCARDIOGRAM (TEE) (N/A) INDOCYANINE GREEN FLUORESCENCE IMAGING (ICG) (N/A) Clipping Of Atrial Appendage (Left)   Events: On bipap this am. Confused overnight _______________________________________________________________ Vitals: BP 119/65   Pulse 89   Temp 97.8 F (36.6 C) (Axillary)   Resp 13   Ht 5\' 7"  (1.702 m)   Wt 68.4 kg   SpO2 100%   BMI 23.62 kg/m   - Neuro: arousable  - Cardiovascular: sinus in the 90.  Drips: levo 4.  milr 0.375 dopamine 2.5.  Lasix 4.   CVP:  [6 mmHg-44 mmHg] 6 mmHg  - Pulm: coarse B Vent Mode: BIPAP FiO2 (%):  [50 %] 50 % Set Rate:  [15 bmp] 15 bmp PEEP:  [8 cmH20] 8 cmH20  ABG    Component Value Date/Time   PHART 7.509 (H) 01/01/2020 1514   PCO2ART 37.5 01/01/2020 1514   PO2ART 59.0 (L) 01/01/2020 1514   HCO3 29.9 (H) 01/01/2020 1514   TCO2 31 01/01/2020 1514   ACIDBASEDEF 5.0 (H) 12/31/2019 2230   O2SAT 71.8 01/03/2020 0443    - Abd: soft - Extremity: + edema  .Intake/Output      01/30 0701 - 01/31 0700 01/31 0701 - 02/01 0700   P.O. 600    I.V. (mL/kg) 1177.7 (17.2) 37.7 (0.6)   Blood     Other 90    IV Piggyback 272.1    Total Intake(mL/kg) 2139.8 (31.3) 37.7 (0.6)   Urine (mL/kg/hr) 995 (0.6) 150 (0.9)   Stool 0    Chest Tube 770    Total Output 1765 150   Net +374.8 -112.3        Stool Occurrence 1 x       _______________________________________________________________ Labs: CBC Latest Ref Rng & Units 01/03/2020 01/02/2020 01/02/2020  WBC 4.0 - 10.5 K/uL 19.1(H) 20.6(H) 25.3(H)  Hemoglobin 13.0 - 17.0 g/dL 8.4(L) 10.1(L) 10.3(L)  Hematocrit 39.0 - 52.0  % 25.0(L) 29.9(L) 30.4(L)  Platelets 150 - 400 K/uL 88(L) 80(L) 91(L)   CMP Latest Ref Rng & Units 01/03/2020 01/02/2020 01/02/2020  Glucose 70 - 99 mg/dL 163(H) 171(H) 130(H)  BUN 8 - 23 mg/dL 51(H) 40(H) 26(H)  Creatinine 0.61 - 1.24 mg/dL 2.00(H) 1.81(H) 1.45(H)  Sodium 135 - 145 mmol/L 145 145 142  Potassium 3.5 - 5.1 mmol/L 4.3 4.3 5.3(H)  Chloride 98 - 111 mmol/L 104 105 105  CO2 22 - 32 mmol/L 28 28 27   Calcium 8.9 - 10.3 mg/dL 8.0(L) 7.8(L) 7.8(L)  Total Protein 6.5 - 8.1 g/dL 4.9(L) 5.7(L) -  Total Bilirubin 0.3 - 1.2 mg/dL 3.4(H) 3.5(H) -  Alkaline Phos 38 - 126 U/L 67 47 -  AST 15 - 41 U/L 457(H) 759(H) -  ALT 0 - 44 U/L 650(H) 899(H) -    CXR: stable  _______________________________________________________________  Assessment and Plan: POD 3 s/p CABG, AVR, PVE, atriclip  Neuro: confused.  Will continue to re-orient.  Family will be at the bedside.  Restraints for now CV: pacer set to back-up of 60.  Had run of VT. Would decrease  milr, but will defer to heart failure Pulm: on and off bipap.  Needs pulm toilet Renal: creat slightly up.  On lasix gtt GI: NPO for now due to confusion Heme: getting unit of blood per HF ID: afebrile Endo: SSI Dispo: continue ICU care  David Bouillon, MD 01/03/2020 9:32 AM

## 2020-01-03 NOTE — Plan of Care (Signed)
Patient had swallow study showing aspiration. NPO currently will get cortrak in AM.  Nsg diagnosis Ineffective airway clearance related to weak cough, as evidence by aspiration Interventions Sit up 30 degree Frequent mouth care Remain NPO Monitor lung sounds Q 4h and vitals signs note any temperatures Monitor and collect CBC, not WBC  Imbalance: Nutrition less than body requirements Interventions Maintain patency of cortrak when placed Note stools/ consistency Monitor bowel sounds q4H Monitor albumin and electrolyte- replace per order as needed.

## 2020-01-04 ENCOUNTER — Inpatient Hospital Stay (HOSPITAL_COMMUNITY): Payer: Medicare Other

## 2020-01-04 LAB — COMPREHENSIVE METABOLIC PANEL
ALT: 655 U/L — ABNORMAL HIGH (ref 0–44)
AST: 332 U/L — ABNORMAL HIGH (ref 15–41)
Albumin: 3.2 g/dL — ABNORMAL LOW (ref 3.5–5.0)
Alkaline Phosphatase: 59 U/L (ref 38–126)
Anion gap: 11 (ref 5–15)
BUN: 50 mg/dL — ABNORMAL HIGH (ref 8–23)
CO2: 28 mmol/L (ref 22–32)
Calcium: 8 mg/dL — ABNORMAL LOW (ref 8.9–10.3)
Chloride: 106 mmol/L (ref 98–111)
Creatinine, Ser: 1.81 mg/dL — ABNORMAL HIGH (ref 0.61–1.24)
GFR calc Af Amer: 40 mL/min — ABNORMAL LOW (ref >60)
GFR calc non Af Amer: 35 mL/min — ABNORMAL LOW (ref >60)
Glucose, Bld: 140 mg/dL — ABNORMAL HIGH (ref 70–99)
Potassium: 3.9 mmol/L (ref 3.5–5.1)
Sodium: 145 mmol/L (ref 135–145)
Total Bilirubin: 3.1 mg/dL — ABNORMAL HIGH (ref 0.3–1.2)
Total Protein: 5.3 g/dL — ABNORMAL LOW (ref 6.5–8.1)

## 2020-01-04 LAB — BPAM RBC
Blood Product Expiration Date: 202102282359
Blood Product Expiration Date: 202102282359
Blood Product Expiration Date: 202102282359
Blood Product Expiration Date: 202103012359
Blood Product Expiration Date: 202103022359
Blood Product Expiration Date: 202103022359
ISSUE DATE / TIME: 202101281549
ISSUE DATE / TIME: 202101281549
ISSUE DATE / TIME: 202101281606
ISSUE DATE / TIME: 202101281612
ISSUE DATE / TIME: 202101282020
ISSUE DATE / TIME: 202101310943
Unit Type and Rh: 9500
Unit Type and Rh: 9500
Unit Type and Rh: 9500
Unit Type and Rh: 9500
Unit Type and Rh: 9500
Unit Type and Rh: 9500

## 2020-01-04 LAB — TYPE AND SCREEN
ABO/RH(D): O NEG
Antibody Screen: NEGATIVE
Unit division: 0
Unit division: 0
Unit division: 0
Unit division: 0
Unit division: 0
Unit division: 0

## 2020-01-04 LAB — GLUCOSE, CAPILLARY
Glucose-Capillary: 106 mg/dL — ABNORMAL HIGH (ref 70–99)
Glucose-Capillary: 119 mg/dL — ABNORMAL HIGH (ref 70–99)
Glucose-Capillary: 129 mg/dL — ABNORMAL HIGH (ref 70–99)
Glucose-Capillary: 95 mg/dL (ref 70–99)
Glucose-Capillary: 123 mg/dL — ABNORMAL HIGH (ref 70–99)

## 2020-01-04 LAB — CBC
HCT: 31.8 % — ABNORMAL LOW (ref 39.0–52.0)
Hemoglobin: 10.8 g/dL — ABNORMAL LOW (ref 13.0–17.0)
MCH: 33.3 pg (ref 26.0–34.0)
MCHC: 34 g/dL (ref 30.0–36.0)
MCV: 98.1 fL (ref 80.0–100.0)
Platelets: 93 10*3/uL — ABNORMAL LOW (ref 150–400)
RBC: 3.24 MIL/uL — ABNORMAL LOW (ref 4.22–5.81)
RDW: 19.2 % — ABNORMAL HIGH (ref 11.5–15.5)
WBC: 16.6 10*3/uL — ABNORMAL HIGH (ref 4.0–10.5)
nRBC: 2 % — ABNORMAL HIGH (ref 0.0–0.2)

## 2020-01-04 LAB — COOXEMETRY PANEL
Carboxyhemoglobin: 1.3 % (ref 0.5–1.5)
Methemoglobin: 1.2 % (ref 0.0–1.5)
O2 Saturation: 59.8 %
Total hemoglobin: 9.7 g/dL — ABNORMAL LOW (ref 12.0–16.0)

## 2020-01-04 LAB — SURGICAL PATHOLOGY

## 2020-01-04 MED ORDER — SODIUM CHLORIDE 0.9 % IV SOLN
2.0000 g | Freq: Two times a day (BID) | INTRAVENOUS | Status: DC
  Administered 2020-01-04 – 2020-01-07 (×6): 2 g via INTRAVENOUS
  Filled 2020-01-04 (×7): qty 2

## 2020-01-04 MED ORDER — MAGIC MOUTHWASH
5.0000 mL | Freq: Three times a day (TID) | ORAL | Status: DC
  Administered 2020-01-04 – 2020-01-07 (×11): 5 mL via ORAL
  Filled 2020-01-04 (×13): qty 5

## 2020-01-04 NOTE — Progress Notes (Signed)
Patient ID: David Perez, male   DOB: October 25, 1941, 79 y.o.   MRN: [id number]     Advanced Heart Failure Rounding Note  PCP-Cardiologist: No primary care provider on file.   Subjective:    1/28: CABG (LIMA-LAD, SVG-LPDA, SVG-OM1), Maze + LA appendage clip, bioprosthetic AVR.  1/29: Extubated, IABP removed  Confused/disoriented again this morning but less agitated.  Failed swallow study yesterday but was sedated.    He remains on dopamine 2.5, milrinone 0.25. Off epinephrine and norepinephrine.   Co-ox 60% this morning with CVP 6. He is on Lasix gtt at 4.  Creatinine 1.8 => 2 => 1.8.  Good UOP, oxygen down to 2L [place]. CXR with improved pulmonary edema.   LFTs trending down slightly.    Hgb 10 => 8.4 => 10.8, plts trending up at 93K.  WBCs down to 16.6, afebrile.   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   Vital Signs:   Temp:  [96.7 F (35.9 C)-99.2 F (37.3 C)] 99.2 F (37.3 C) (02/01 0400) Pulse Rate:  [83-93] 92 (02/01 0737) Resp:  [12-21] 17 (02/01 0737) BP: (99-134)/(53-89) 134/75 (02/01 0700) SpO2:  [89 %-100 %] 97 % (02/01 0737) FiO2 (%):  [50 %] 50 % (01/31 0900) Weight:  [67 kg] 67 kg (02/01 0451) Last BM Date: 01/02/20  Weight change: Filed Weights   01/02/20 0500 01/03/20 0458 01/04/20 0451  Weight: 68.6 kg 68.4 kg 67 kg    Intake/Output:   Intake/Output Summary (Last 24 hours) at 01/04/2020 0738 Last data filed at 01/04/2020 0600 Gross per 24 hour  Intake 864.23 ml  Output 2925 ml  Net -2060.77 ml      Physical Exam    General: NAD Neck: No JVD, no thyromegaly or thyroid nodule.  Lungs: Clear to auscultation bilaterally with normal respiratory effort. CV: Nondisplaced PMI.  Heart regular S1/S2, no S3/S4, 1/6 SEM RUSB.  No peripheral edema.   Abdomen: Soft, nontender, no hepatosplenomegaly, no distention.  Skin: Intact without lesions or rashes.  Neurologic: Alert but confused.    Psych: Normal affect. Extremities: No clubbing or cyanosis.  HEENT: Normal.    Telemetry   NSR with LBBB around 80 (personally reviewed)  Labs    CBC Recent Labs    01/03/20 0443 01/04/20 0343  WBC 19.1* 16.6*  HGB 8.4* 10.8*  HCT 25.0* 31.8*  MCV 101.6* 98.1  PLT 88* 93*   Basic Metabolic Panel Recent Labs    01/01/20 1635 01/02/20 0400 01/03/20 0443 01/04/20 0343  NA 145   < > 145 145  K 2.8*   < > 4.3 3.9  CL 113*   < > 104 106  CO2 24   < > 28 28  GLUCOSE 126*   < > 163* 140*  BUN 15   < > 51* 50*  CREATININE 0.83   < > 2.00* 1.81*  CALCIUM 6.7*   < > 8.0* 8.0*  MG 1.7  --   --   --    < > = values in this interval not displayed.   Liver Function Tests Recent Labs    01/03/20 0443 01/04/20 0343  AST 457* 332*  ALT 650* 655*  ALKPHOS 67 59  BILITOT 3.4* 3.1*  PROT 4.9* 5.3*  ALBUMIN 3.1* 3.2*   No results for input(s): LIPASE, AMYLASE in the last 72 hours. Cardiac Enzymes No results for input(s): CKTOTAL, CKMB, CKMBINDEX,  TROPONINI in the last 72 hours.  BNP: BNP (last 3 results) No results for input(s): BNP in the last 8760 hours.  ProBNP (last 3 results) No results for input(s): PROBNP in the last 8760 hours.   D-Dimer No results for input(s): DDIMER in the last 72 hours. Hemoglobin A1C No results for input(s): HGBA1C in the last 72 hours. Fasting Lipid Panel No results for input(s): CHOL, HDL, LDLCALC, TRIG, CHOLHDL, LDLDIRECT in the last 72 hours. Thyroid Function Tests No results for input(s): TSH, T4TOTAL, T3FREE, THYROIDAB in the last 72 hours.  Invalid input(s): FREET3  Other results:   Imaging    DG Chest 1 View  Result Date: 01/04/2020 CLINICAL DATA:  Shortness of breath. EXAM: CHEST  1 VIEW COMPARISON:  Chest x-ray 01/03/2020 FINDINGS: Stable surgical changes from bypass surgery and aortic valve replacement surgery. Bilateral chest tubes in good position. No pneumothorax. Left subclavian central venous catheter is  stable. Mediastinal drain tube is unchanged. Persistent cardiac enlargement but slight improved pulmonary edema. Persistent small right pleural effusion and bibasilar atelectasis. IMPRESSION: 1. Stable postoperative support apparatus. 2. Resolving pulmonary edema. Electronically Signed   By: Marijo Sanes M.D.   On: 01/04/2020 06:09   DG CHEST PORT 1 VIEW  Result Date: 01/03/2020 CLINICAL DATA:  Follow-up exam. EXAM: PORTABLE CHEST 1 VIEW COMPARISON:  Chest radiograph 01/02/2020 FINDINGS: Left upper extremity central venous catheter tip projects over the superior vena cava. Bilateral chest tubes in place. Monitoring leads overlie the patient. Stable cardiomegaly status post median sternotomy. Persistent small right pleural effusion with underlying consolidation. New small left pleural effusion and retrocardiac consolidation. Increasing bilateral interstitial pulmonary opacities. No pneumothorax. IMPRESSION: Increasing bilateral interstitial opacities may represent pulmonary edema. New small left effusion and retrocardiac consolidation. Persistent small right pleural effusion and underlying consolidation. Support apparatus as above. Electronically Signed   By: Lovey Newcomer M.D.   On: 01/03/2020 09:23     Medications:     Scheduled Medications: . sodium chloride   Intravenous Once  . sodium chloride   Intravenous Once  . sodium chloride   Intravenous Once  . acetaminophen  1,000 mg Oral Q6H   Or  . acetaminophen (TYLENOL) oral liquid 160 mg/5 mL  1,000 mg Per Tube Q6H  . amiodarone  200 mg Oral BID  . aspirin  300 mg Rectal Daily  . atorvastatin  80 mg Oral q1800  . bisacodyl  10 mg Oral Daily   Or  . bisacodyl  10 mg Rectal Daily  . chlorhexidine gluconate (MEDLINE KIT)  15 mL Mouth Rinse BID  . Chlorhexidine Gluconate Cloth  6 each Topical Daily  . docusate sodium  200 mg Oral Daily  . feeding supplement (ENSURE ENLIVE)  237 mL Oral BID BM  . insulin aspart  0-24 Units Subcutaneous Q4H  .  levalbuterol  0.63 mg Nebulization TID  . pantoprazole (PROTONIX) IV  40 mg Intravenous Q24H  . QUEtiapine  25 mg Oral QHS  . sodium chloride flush  3 mL Intravenous Q12H    Infusions: . sodium chloride    . sodium chloride    . sodium chloride 10 mL/hr at 12/31/19 2354  . ceFEPime (MAXIPIME) IV 1 g (01/03/20 2355)  . EPINEPHrine 4 mg in dextrose 5% 250 mL infusion (16 mcg/mL) Stopped (01/01/20 1904)  . insulin Stopped (01/01/20 2324)  . lactated ringers    . lactated ringers    . lactated ringers Stopped (01/02/20 1521)  . milrinone 0.25 mcg/kg/min (01/03/20 1451)  .  norepinephrine (LEVOPHED) Adult infusion Stopped (01/03/20 1351)    PRN Medications: sodium chloride, dextrose, lactated ringers, metoprolol tartrate, ondansetron (ZOFRAN) IV, oxyCODONE, sodium chloride flush   Assessment/Plan   1. CAD: STEMI with very short LM and 99% ostial LAD. PCI thought to risk compromise of ostial large LCx, so decision made to defer PCI and transfer to [facility] for CABG as patient was CP-free at that point. Now s/p CABG with LIMA-LAD, SVG-OM2, SVG-LPDA.   - Continue ASA.  - Continue atorvastatin 80 mg daily.  2. Cardiogenic shock: Pre-op echo with EF 35%.  Now s/p CABG-AVR.  He is on dopamine 2.5, milrinone 0.375, NE 6. IABP out and epinephrine off. CXR improved today.  Good UOP, weight down 3 lbs.  CVP 6, co-ox 60%.  - Continue milrinone, will stop dopamine.  - Stop Lasix this morning with co-ox 6.  3. Atrial fibrillation: S/p Maze, LA appendage clip.  Suspect NSR.  - Will get ECG.  - Amiodarone gtt is off, continue amiodarone 200 mg bid (when he can take pills)   - Will need eventual anticoagulation.  4. Hyperlipidemia: atorvastatin.  5. Thrombocytopenia: Post-op, trending up.  6. Acute hypoxemic respiratory failure: Pulmonary edema. Now on 2L [place].  - Improved.  7. AKI: Creatinine trending down 1.8.  8. Neuro: Agitated delirium with paranoia.  Daughter is able to re-orient.  - Encourage  daughter to come in later today.  9. ID: WBCs 25 => 19 => 16.6, afebrile.  CXR does not show PNA.  - Cultures NGTD  - Low threshold for abx if spikes fever or further WBC rise.  10. Anemia: Post-op, got 1 unit PRBCs 1/31.  11. Dysphagia: He was sedated at time of swallow study yesterday.  Remains NPO, repeat swallow.  12. Mobilize, up to chair when he is awake.  Work with PT.     CRITICAL CARE Performed by: Loralie Champagne  Total critical care time: 35 minutes  Critical care time was exclusive of separately billable procedures and treating other patients.  Critical care was necessary to treat or prevent imminent or life-threatening deterioration.  Critical care was time spent personally by me on the following activities: development of treatment plan with patient and/or surrogate as well as nursing, discussions with consultants, evaluation of patient's response to treatment, examination of patient, obtaining history from patient or surrogate, ordering and performing treatments and interventions, ordering and review of laboratory studies, ordering and review of radiographic studies, pulse oximetry and re-evaluation of patient's condition.   Length of Stay: Catherine, MD  01/04/2020, 7:38 AM  Advanced Heart Failure Team Pager [phone] (M-F; 7a - 4p)  Please contact [facility] for night-coverage after hours (4p -7a ) and weekends on [email or web address]

## 2020-01-04 NOTE — Evaluation (Signed)
Physical Therapy Evaluation Patient Details Name: David Perez MRN: [id number] DOB: [birth year] Today's Date: 01/04/2020   History of Present Illness  Pt is an 79 y.o. 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 Extubated and IABP removed on 1/29. CXR with improved pulmonary edema. PMH includes Hep B.  Clinical Impression  Pt in bed with daughter present upon arrival of PT, agreeable to PT evaluation at this time. The pt presents with limitations in functional mobility, endurance, and stability compared to his prior level of function and independence due to above dx. The pt was able to transition to EOB and stand with good maintenance of sternal precautions, and was able to take a few steps with HHA of 1 with +2 for safety and line management. The pt will continue to benefit from skilled PT acutely to progress activity tolerance and functional independence, and will likely need continued rehab in in-patient setting prior to d/c home as pt lives alone at baseline.      Follow Up Recommendations CIR;Supervision/Assistance - 24 hour    Equipment Recommendations  Rolling walker with 5" wheels;3in1 (PT)    Recommendations for Other Services       Precautions / Restrictions Precautions Precautions: Sternal;Fall Precaution Booklet Issued: No Precaution Comments: sternal precautions discussed with pt and daughter, chest tube Restrictions Weight Bearing Restrictions: Yes Other Position/Activity Restrictions: sternal precautions      Mobility  Bed Mobility Overal bed mobility: Needs Assistance Bed Mobility: Rolling;Supine to Sit;Sit to Supine Rolling: Min assist   Supine to sit: Mod assist Sit to supine: Mod assist   General bed mobility comments: Pt able to initiate movements, needs min/modA of 1 to complete motion as well as for line management. needs assist with pad for scooting to EOB  Transfers Overall transfer level: Needs  assistance Equipment used: 2 person hand held assist Transfers: Sit to/from Stand Sit to Stand: Min assist;Mod assist;+2 safety/equipment         General transfer comment: modA of 1 or minA of 2 with HHA, VCs for sternal precautions  Ambulation/Gait Ambulation/Gait assistance: Min assist;+2 safety/equipment Gait Distance (Feet): 5 Feet Assistive device: 2 person hand held assist Gait Pattern/deviations: Decreased stride length;Step-to pattern Gait velocity: decreased Gait velocity interpretation: <1.31 ft/sec, indicative of household ambulator General Gait Details: Pt able to step laterally to L with HHA of 1 or 2, +2 for line management. no LOB, modA through [facility]  Stairs            Wheelchair Mobility    Modified Rankin (Stroke Patients Only)       Balance Overall balance assessment: Needs assistance Sitting-balance support: Single extremity supported;Feet supported Sitting balance-Leahy Scale: Fair Sitting balance - Comments: supervision     Standing balance-Leahy Scale: Poor Standing balance comment: reliant on UE support, HHA or RW                             Pertinent Vitals/Pain Pain Assessment: No/denies pain Pain Location: pt reports no pain, but sig discomfort from dry mouth Pain Intervention(s): Monitored during session    Home Living Family/patient expects to be discharged to:: Private residence Living Arrangements: Alone Available Help at Discharge: Family(2 daughters live nearby can work towards schedule for 24/7 supervision) Type of Home: House Home Access: Level entry     Home Layout: One level Home Equipment: None      Prior  Function Level of Independence: Independent         Comments: pt previously indpendent without AD, enjoys walking for exercise. driving independently     Hand Dominance   Dominant Hand: Right    Extremity/Trunk Assessment   Upper Extremity Assessment Upper Extremity Assessment: Overall WFL for  tasks assessed(limited use due to sternal precautions)    Lower Extremity Assessment Lower Extremity Assessment: Generalized weakness    Cervical / Trunk Assessment Cervical / Trunk Assessment: Normal  Communication   Communication: No difficulties  Cognition Arousal/Alertness: Awake/alert Behavior During Therapy: Restless;WFL for tasks assessed/performed Overall Cognitive Status: Within Functional Limits for tasks assessed Area of Impairment: Safety/judgement                   Current Attention Level: Focused Memory: Decreased recall of precautions   Safety/Judgement: Decreased awareness of deficits;Decreased awareness of safety Awareness: Intellectual   General Comments: Pt generally cooperative and eager to participate, perseverating on desire for water due to mouth feeling "bone dry" despite multiple educations on why it is currently unsafe for him to have water.      General Comments      Exercises     Assessment/Plan    PT Assessment Patient needs continued PT services  PT Problem List Decreased strength;Decreased mobility;Decreased safety awareness;Decreased knowledge of precautions;Decreased activity tolerance;Decreased balance       PT Treatment Interventions DME instruction;Therapeutic exercise;Gait training;Balance training;Stair training;Functional mobility training;Therapeutic activities;Patient/family education    PT Goals (Current goals can be found in the Care Plan section)  Acute Rehab PT Goals Patient Stated Goal: to get some water PT Goal Formulation: With patient Time For Goal Achievement: 01/18/20 Potential to Achieve Goals: Good    Frequency Min 3X/week   Barriers to discharge        Co-evaluation               AM-PAC PT "6 Clicks" Mobility  Outcome Measure Help needed turning from your back to your side while in a flat bed without using bedrails?: A Little Help needed moving from lying on your back to sitting on the side  of a flat bed without using bedrails?: A Lot Help needed moving to and from a bed to a chair (including a wheelchair)?: A Little Help needed standing up from a chair using your arms (e.g., wheelchair or bedside chair)?: A Little Help needed to walk in hospital room?: A Lot Help needed climbing 3-5 steps with a railing? : A Lot 6 Click Score: 15    End of Session Equipment Utilized During Treatment: Oxygen Activity Tolerance: Patient tolerated treatment well Patient left: in bed;with call bell/phone within reach;with nursing/sitter in room;with family/visitor present Nurse Communication: Mobility status PT Visit Diagnosis: Difficulty in walking, not elsewhere classified (R26.2);Muscle weakness (generalized) (M62.81)    Time: [id number] PT Time Calculation (min) (ACUTE ONLY): 32 min   Charges:   PT Evaluation $PT Eval Moderate Complexity: 1 Mod PT Treatments $Therapeutic Activity: 8-22 mins        Karma Ganja, PT, DPT   Acute Rehabilitation Department Pager #: [phone]  Otho Bellows 01/04/2020, 11:24 AM

## 2020-01-04 NOTE — Discharge Instructions (Signed)

## 2020-01-04 NOTE — Plan of Care (Signed)
  Problem: Clinical Measurements: Goal: Ability to maintain clinical measurements within normal limits will improve Outcome: Progressing Goal: Respiratory complications will improve Outcome: Progressing Goal: Cardiovascular complication will be avoided Outcome: Progressing   Problem: Nutrition: Goal: Adequate nutrition will be maintained Outcome: Not Progressing   Problem: Coping: Goal: Level of anxiety will decrease Outcome: Not Progressing   Problem: Elimination: Goal: Will not experience complications related to urinary retention Outcome: Progressing   Problem: Pain Managment: Goal: General experience of comfort will improve Outcome: Progressing   Problem: Safety: Goal: Ability to remain free from injury will improve Outcome: Progressing   Problem: Skin Integrity: Goal: Risk for impaired skin integrity will decrease Outcome: Progressing   Problem: Cardiac: Goal: Will achieve and/or maintain hemodynamic stability Outcome: Progressing   Problem: Clinical Measurements: Goal: Postoperative complications will be avoided or minimized Outcome: Progressing   Problem: Skin Integrity: Goal: Wound healing without signs and symptoms of infection Outcome: Progressing

## 2020-01-04 NOTE — Progress Notes (Signed)
Patient is pulling cords and took himself off of the monitor. This RN attempted to reorient patient which was unsuccessful. MD was paged and verbal orders were to give 1mg  of Ativan. Medication was given, pt is now calm and resting. Will continue to monitor.

## 2020-01-04 NOTE — Progress Notes (Signed)
Inpatient Rehab Admissions:  Inpatient Rehab Consult received.  I met with pt and his daugther at the bedside for rehabilitation assessment and to discuss goals and expectations of an inpatient rehab admission. Pt interested in the program and would like to pursue if he needs it. Pt still has chest tube and is on milrinone. We discussed that I will continue to follow pt's progress with therapies while he stabilizes medically. Pt and his daughter aware that I will assess his candidacy daily to see if he still has IP Rehab needs once medically ready.   Will follow.   Raechel Ache, OTR/L  Rehab Admissions Coordinator  [phone] 01/04/2020 1:38 PM

## 2020-01-04 NOTE — Progress Notes (Signed)
4 Days Post-Op Procedure(s) (LRB): CORONARY ARTERY BYPASS GRAFTING (CABG) TIMES THREE USING LEFT INTERNAL MAMMARY ARTERY AND LEFT GREATER SAPHENOUS LEG VEIN HARVESTED ENDOSCOPICALLY (N/A) AORTIC VALVE REPLACEMENT (AVR) (N/A) MAZE (N/A) TRANSESOPHAGEAL ECHOCARDIOGRAM (TEE) (N/A) INDOCYANINE GREEN FLUORESCENCE IMAGING (ICG) (N/A) Clipping Of Atrial Appendage (Left) Subjective: thirsty  Objective: Vital signs in last 24 hours: Temp:  [96.7 F (35.9 C)-99.2 F (37.3 C)] 99.2 F (37.3 C) (02/01 0400) Pulse Rate:  [83-93] 92 (02/01 0737) Cardiac Rhythm: Normal sinus rhythm (02/01 0400) Resp:  [12-21] 17 (02/01 0737) BP: (99-134)/(53-89) 134/75 (02/01 0700) SpO2:  [89 %-100 %] 97 % (02/01 0737) FiO2 (%):  [50 %] 50 % (01/31 0900) Weight:  [67 kg] 67 kg (02/01 0451)  Hemodynamic parameters for last 24 hours: CVP:  [5 mmHg-10 mmHg] 5 mmHg  Intake/Output from previous day: 01/31 0701 - 02/01 0700 In: 864.2 [P.O.:90; I.V.:459.2; Blood:315] Out: 3075 [Urine:2565; Chest Tube:510] Intake/Output this shift: No intake/output data recorded.  General appearance: alert and cooperative Neurologic: intact Heart: regular rate and rhythm, S1, S2 normal, no murmur, click, rub or gallop Lungs: diminished breath sounds bibasilar Abdomen: soft, non-tender; bowel sounds normal; no masses,  no organomegaly Extremities: edema 1+ Wound: c/d/i  Lab Results: Recent Labs    01/03/20 0443 01/04/20 0343  WBC 19.1* 16.6*  HGB 8.4* 10.8*  HCT 25.0* 31.8*  PLT 88* 93*   BMET:  Recent Labs    01/03/20 0443 01/04/20 0343  NA 145 145  K 4.3 3.9  CL 104 106  CO2 28 28  GLUCOSE 163* 140*  BUN 51* 50*  CREATININE 2.00* 1.81*  CALCIUM 8.0* 8.0*    PT/INR: No results for input(s): LABPROT, INR in the last 72 hours. ABG    Component Value Date/Time   PHART 7.509 (H) 01/01/2020 1514   HCO3 29.9 (H) 01/01/2020 1514   TCO2 31 01/01/2020 1514   ACIDBASEDEF 5.0 (H) 12/31/2019 2230   O2SAT 59.8  01/04/2020 0410   CBG (last 3)  Recent Labs    01/03/20 2005 01/03/20 2336 01/04/20 0349  GLUCAP 136* 123* 106*    Assessment/Plan: S/P Procedure(s) (LRB): CORONARY ARTERY BYPASS GRAFTING (CABG) TIMES THREE USING LEFT INTERNAL MAMMARY ARTERY AND LEFT GREATER SAPHENOUS LEG VEIN HARVESTED ENDOSCOPICALLY (N/A) AORTIC VALVE REPLACEMENT (AVR) (N/A) MAZE (N/A) TRANSESOPHAGEAL ECHOCARDIOGRAM (TEE) (N/A) INDOCYANINE GREEN FLUORESCENCE IMAGING (ICG) (N/A) Clipping Of Atrial Appendage (Left) Mobilize repeat swallowing assessment, but he is hoarse so it's likely to be abnormal Ok to stop lasix; ok to stop DA oob to chair PT/OT, CIR consult   LOS: 4 days    Wonda Olds 01/04/2020

## 2020-01-04 NOTE — Progress Notes (Signed)
Nutrition Follow-up  DOCUMENTATION CODES:   Not applicable  INTERVENTION:   Plan for Cortrak insertion tomorrow (2/2) with TF initiation vs Diet Advancement  Tube Feeding Recommendations: Osmolite 1.2 at 65 ml/hr Begin at 20 ml/hr; titrate by 10 mL q 8 hours until goal rate of 65 ml/hr Provides 87 g of protein, 1872 kcals and 1264 mL of free water Meets 100% estimated calorie and protein needs    NUTRITION DIAGNOSIS:   Increased nutrient needs related to post-op healing as evidenced by estimated needs.  Being addressed  GOAL:   Patient will meet greater than or equal to 90% of their needs  Not Met  MONITOR:   Labs, I & O's, Supplement acceptance, PO intake, Weight trends  REASON FOR ASSESSMENT:   Consult Enteral/tube feeding initiation and management  ASSESSMENT:   79 year old with past medical history of Hepatitis B transferred from [facility] by Dr. Clayborn Bigness for evaluation of CABG. Patient presented to [facility] ED with severe chest pressure in the central chest, ECG showed diffuse ST depression with STE in VI and AVR and taken to cath lab emergently, post-cath pt went into atrial fibrillation with RVR, amiodarone gtt and norepinephrine gtt started and pt transferred fot [facility].  1/28 CABG x 3 1/29 Extubated  Pt remains NPO post extubation  Dietitian Consult received for TF initiation yesterday with plan for Cortrak insertion; Cortrak service not available until tomorrow 2/2.   Pt is complaining of being very thirsty, SLP re-evaluated today and pt to remain NPO  Labs: reviewed Meds: ss novolog  Diet Order:   Diet Order            Diet NPO time specified  Diet effective now              EDUCATION NEEDS:   No education needs have been identified at this time  Skin:  Skin Assessment: Skin Integrity Issues: Skin Integrity Issues:: Incisions Incisions: closed; rt leg  Last BM:  1/31  Height:   Ht Readings from Last 1 Encounters:  12/31/19 '5\' 7"'  (1.702 m)     Weight:   Wt Readings from Last 1 Encounters:  01/04/20 67 kg    Ideal Body Weight:  67.3 kg  BMI:  Body mass index is 23.13 kg/m.  Estimated Nutritional Needs:   Kcal:  5320-2334 kcals  Protein:  85-100 g  Fluid:  >/= 1.7 L/day    Kerman Passey MS, RDN, LDN, CNSC [phone] Pager  [phone] Weekend/On-Call Pager

## 2020-01-04 NOTE — Progress Notes (Signed)
  Speech Language Pathology Treatment: Dysphagia  Patient Details Name: David Perez MRN: [id number] DOB: 09-23-1941 Today's Date: 01/04/2020 Time: 1330-1400 SLP Time Calculation (min) (ACUTE ONLY): 30 min  Assessment / Plan / Recommendation Clinical Impression  Pt seen at bedside for re-assessment of swallow function/safety given improvement in alertness since MBS completed 01/03/20. Pt was fully awake and alert today. Daughter present. Nursing repositioned pt for po intake. During oral care, SLP noticed white spots on facial arches and palate, with the appearance of thrush. RN informed. Pt removed upper denture for RN to clean. Pt replaced upper denture after cleaning. Pt accepted trials of ice chips, sips of thin liquid, and small boluses of puree. Ice chips appeared to be tolerated initially. Cough response noted after thin liquid and puree. Pt also exhibited multiple swallows following 1/2 tsp bolus of puree, raising concern for post-swallow residue which pt aspirated on MBS yesterday. Pt cough response was weak and nonproductive. Cough response noted after ice chips later in session, and trials were discontinued.   Recommend 3-4 ice chips after oral care, provided by nursing only, when pt awake, alert, and upright. SLP will continue to follow to assess readiness for repeat MBS. At this time, pt continues to exhibit signs of aspiration, and is unable to cough strongly enough to protect airway.    HPI HPI: 79 y.o. with minimal past history was transferred by Dr. Clayborn Bigness from [facility] tonight for evaluation for CABG.  He has done well in general, remains very active, lives alone in [place], and has been taking no medications.  No history of cardiac problems. Pt admitted for STEMI following pressure in chest 1/27, now s/p CABG. Intubated approx 26 hours for procedure and following.  CXR 1/31 showing B opacities consistent with pulmonary edema.      SLP Plan  Continue with current plan of care        Recommendations  Diet recommendations: NPO Medication Administration: Via alternative means                Oral Care Recommendations: Oral care prior to ice chip/H20 Follow up Recommendations: (contd ST at next venue) SLP Visit Diagnosis: Dysphagia, oropharyngeal phase (R13.12) Plan: Continue with current plan of care       GO               Deaaron Fulghum B. Quentin Ore, [facility], [place] Speech Language Pathologist Office: [phone] Pager: [phone]  Shonna Chock 01/04/2020, 2:08 PM

## 2020-01-04 NOTE — Discharge Summary (Signed)
Physician Discharge Summary  Patient ID: David Perez MRN: [id number] DOB/AGE: Nov 13, 1941 79 y.o.  Admit date: 12/31/2019 Discharge date: 01/08/2020  Admission Diagnoses: Coronary artery disease Left main coronary artery stenosis Acute ST elevation myocardial infarction Cardiogenic shock Atrial fibrillation, new onset Dyslipidemia    Discharge Diagnoses:  Coronary artery disease Left main coronary artery stenosis Acute ST elevation myocardial infarction Cardiogenic shock Atrial fibrillation, new onset Dyslipidemia STEMI (ST elevation myocardial infarction) (DISH) Left ventricular dysfunction Severe aortic stenosis S/P CABG x 3 Post-op dysphagia Acute renal insufficiency   Discharged Condition: stable  History of Present Illness:     79 yo man without significant PMHx noted increased fatigue and SOB with ambulation of approximately 1/4 mile in past few months. He also noted "heavy legs" after taking out the garbage and he stopped lifting weights and other strenuous activity due to fatigue. He experienced sudden onsent precordial CP last night and presented to [facility] ED where diagnosed with STEMI. Placed IABP and txferred to [facility] for further eval/tx. Now stable without CP and conversant. Lives alone; no prior chest or abd surgery. He is a retired Engineer, water.   Hospital Course: [name] remained stable following transfer from [facility] to [facility].  The intra-aortic balloon pump continue to function appropriately.  The patient was evaluated by Dr. Julien Girt and urgent coronary bypass grafting along with aortic valve replacement were recommended and discussed with the patient in detail.  Decision was made to proceed with surgery.  Patient was taken to the operating room on 12/31/2019 where three-vessel coronary bypass grafting was carried out with the left internal mammary artery to the distal left anterior descending coronary artery and saphenous  vein graft to the posterior descending and first obtuse marginal coronary arteries.  The aortic valve was also replaced utilizing a 21 mm Edwards Intuity bovine bioprosthetic valve.  The left atrial appendage was clipped as well.  Following the procedure, the patient was transferred to the cardiovascular ICU on inotropic support as well as the intra-aortic balloon pump.  He remained hemodynamically stable.  He was weaned from the ventilator and extubated on the first postoperative day.  He was also weaned from the intra-aortic balloon pump and the device was removed on the first postoperative day without complication.  He remained hemodynamically stable.  The inotropic support was slowly weaned off.  He developed mild renal insufficiency following surgery along with significant volume excess.  He was diuresed utilizing a dobutamine infusion at renal dose as well as a Lasix drip.  He had good response to diuretic therapy.  He developed confusion and agitation while in the ICU. He also was noted to have some difficulty swallowing. He was evaluated by the speech pathology service and was felt to be at risk for aspiration. This was confirmed by modified barium swallow so he wsa kept NPO until his swallow function improved.  By post-op day 6 his swallow function had recovered and PO diet was resumed and well tolerated. His mental status also returned to baseline.   He remained hemodynamically stable and in a normal sinus rhythm with occasional PVC's.   He was evaluated by the inpatient rehab staff and was felt to be a appropriate candidate for further inpatient therapy services prior to returning home. He was accepted for transfer to CIR he agreed to proceed with transition on 01/08/20.    **Note to Inpatient Rehab Team:  Mr. Merriam needs a follow up appointment with [facility].  I was unable to secure this prior to his transition to CIR.   Consults: cardiology  Significant Diagnostic  Studies:     LEFT HEART CATH AND CORONARY ANGIOGRAPHY  Conclusion    Mid LM to Dist LM lesion is 90% stenosed.  Dist LM to Ost LAD lesion is 99% stenosed.  Mid LAD lesion is 50% stenosed.  Left ventriculogram not performed  Intra-aortic balloon pump inserted right femoral artery  Triple-lumen catheter inserted right femoral vein   Conclusion STEMI presentation High-grade left main lesion around 90% 99% ostial LAD Widely patent large circumflex Nondominant RCA Left dominant system Left ventriculogram not performed Intra-aortic balloon pump inserted for hemodynamic support Triple-lumen catheter placed for IV access Patient was placed on amiodarone Levophed heparin Patient was transferred to [facility] where [facility] Dr. Marigene Ehlers n accepted the patient in transfer   Recommendations  Antiplatelet/Anticoag Recommend to resume IV heparin, at currently prescribed dose and frequency on 12/31/2019. Concurrent antiplatelet therapy not recommended. Patient appears to be coronary bypass candidate  Coronary Findings  Diagnostic Dominance: Left Left Main  Mid LM to Dist LM lesion 90% stenosed  Mid LM to Dist LM lesion is 90% stenosed. Vessel is the culprit lesion. The lesion is type C, located proximal to the major branch, focal, discrete and eccentric. The lesion was not previously treated. The stenosis was measured by a visual reading. Pressure wire/FFR was not performed on the lesion. IVUS was not performed.  Dist LM to Ost LAD lesion 99% stenosed  Dist LM to Ost LAD lesion is 99% stenosed. Vessel is the culprit lesion. The lesion is located at the major branch, focal, discrete, eccentric and ulcerative. The lesion was not previously treated. The stenosis was measured by a visual reading. Pressure wire/FFR was not performed on the lesion. IVUS was not performed.  Left Anterior Descending  Mid LAD lesion 50% stenosed  Mid LAD lesion is 50% stenosed. Vessel is not the culprit  lesion. The lesion is type B1, located at the major branch, discrete and eccentric. The lesion was not previously treated. The stenosis was measured by a visual reading. Pressure wire/FFR was not performed on the lesion. IVUS was not performed.  Intervention  No interventions have been documented. Wall Motion  Resting    Not performed        Coronary Diagrams  Diagnostic Dominance: Left     ECHOCARDIOGRAM COMPLETE Order #: [id number] Accession #: [id number] Patient Info  Patient name: Perrie Thain  MRN: [id number]  Age: 79 y.o.  Sex: male  MyChart Results Release  MyChart Status: Inactive   Vitals  BP Height Weight BSA (Calculated - sq m)  113/69 5\' 7"  (1.702 m) 67 kg 1.6 sq meters  Study Result  ECHOCARDIOGRAM REPORT       Patient Name:  AL Bruss Date of Exam: 12/31/2019  Medical Rec #: [id number]  Height:    67.0 in  Accession #:  [id number] Weight:    120.0 lb  Date of Birth: 1941-01-02  BSA:     1.63 m  Patient Age:  48 years  BP:      147/70 mmHg  Patient Gender: M      HR:      75 bpm.  Exam Location: Inpatient   Procedure: 2D Echo   Indications:  CAD Native Vessel 414.01 / I25.10    History:    Patient has no prior history of Echocardiogram  examinations.         STEMI (ST elevation  myocardial infarction).    Sonographer:  Vikki Ports Turrentine  Referring Phys: [place]    1. Left ventricular ejection fraction, by visual estimation, is 40 to  45%. The left ventricle has normal function. There is no left ventricular  hypertrophy.  2. Elevated left ventricular end-diastolic pressure.  3. Left ventricular diastolic parameters are consistent with Grade II  diastolic dysfunction (pseudonormalization).  4. The left ventricle demonstrates global hypokinesis.  5. Global right ventricle has normal systolic function.The right  ventricular size is normal. No  increase in right ventricular wall  thickness.  6. Left atrial size was severely dilated.  7. Right atrial size was mildly dilated.  8. Moderate mitral annular calcification.  9. The mitral valve is normal in structure. Mild mitral valve  regurgitation. No evidence of mitral stenosis.  10. The tricuspid valve is normal in structure.  11. The tricuspid valve is normal in structure. Tricuspid valve  regurgitation is mild.  12. Aortic valve mean gradient measures 41.8 mmHg.  13. Aortic valve peak gradient measures 71.0 mmHg.  14. The aortic valve is normal in structure. Aortic valve regurgitation is  not visualized. Severe aortic valve stenosis.  15. There is severe calcifcation of the aortic valve.  16. There is severe thickening of the aortic valve.  17. The pulmonic valve was normal in structure. Pulmonic valve  regurgitation is not visualized.  18. Severely elevated pulmonary artery systolic pressure.  19. The tricuspid regurgitant velocity is 3.95 m/s, and with an assumed  right atrial pressure of 3 mmHg, the estimated right ventricular systolic  pressure is severely elevated at 65.4 mmHg.  20. The inferior vena cava is normal in size with greater than 50%  respiratory variability, suggesting right atrial pressure of 3 mmHg.   FINDINGS  Left Ventricle: Left ventricular ejection fraction, by visual estimation,  is 40 to 45%. The left ventricle has normal function. The left ventricle  demonstrates global hypokinesis. There is no left ventricular hypertrophy.  Left ventricular diastolic  parameters are consistent with Grade II diastolic dysfunction  (pseudonormalization). Elevated left ventricular end-diastolic pressure.  Global hypokinesis slightly worse in the apical regions.   Right Ventricle: The right ventricular size is normal. No increase in  right ventricular wall thickness. Global RV systolic function is has  normal systolic function. The tricuspid regurgitant  velocity is 3.95 m/s,  and with an assumed right atrial pressure  of 3 mmHg, the estimated right ventricular systolic pressure is severely  elevated at 65.4 mmHg.   Left Atrium: Left atrial size was severely dilated.   Right Atrium: Right atrial size was mildly dilated   Pericardium: There is no evidence of pericardial effusion.   Mitral Valve: The mitral valve is normal in structure. Moderate mitral  annular calcification. Mild mitral valve regurgitation. No evidence of  mitral valve stenosis by observation.   Tricuspid Valve: The tricuspid valve is normal in structure. Tricuspid  valve regurgitation is mild.   Aortic Valve: The aortic valve is normal in structure.. There is severe  thickening and severe calcifcation of the aortic valve. Aortic valve  regurgitation is not visualized. Severe aortic stenosis is present. There  is severe thickening of the aortic  valve. There is severe calcifcation of the aortic valve. Aortic valve mean  gradient measures 41.8 mmHg. Aortic valve peak gradient measures 71.0  mmHg. Aortic valve area, by VTI measures 0.33 cm.   Pulmonic Valve: The pulmonic valve was normal in structure. Pulmonic valve  regurgitation is not visualized. Pulmonic regurgitation is not visualized.   Aorta: The aortic root, ascending aorta and aortic arch are all  structurally normal, with no evidence of dilitation or obstruction.   Venous: The inferior vena cava is normal in size with greater than 50%  respiratory variability, suggesting right atrial pressure of 3 mmHg.   IAS/Shunts: No atrial level shunt detected by color flow Doppler. There is  no evidence of a patent foramen ovale. No ventricular septal defect is  seen or detected. There is no evidence of an atrial septal defect.     LEFT VENTRICLE  PLAX 2D  LVIDd:     4.50 cm Diastology  LVIDs:     3.60 cm LV e' lateral:  6.73 cm/s  LV PW:     0.90 cm LV E/e' lateral: 17.8  LV IVS:     0.90 cm LV e' medial:  4.62 cm/s  LVOT diam:   1.70 cm LV E/e' medial: 26.0  LV SV:     38 ml  LV SV Index:  23.83  LVOT Area:   2.27 cm     RIGHT VENTRICLE  RV S prime:   15.00 cm/s  TAPSE (M-mode): 2.1 cm   LEFT ATRIUM       Index    RIGHT ATRIUM      Index  LA diam:    4.50 cm 2.76 cm/m RA Area:   16.60 cm  LA Vol (A2C):  71.0 ml 43.62 ml/m RA Volume:  48.20 ml 29.61 ml/m  LA Vol (A4C):  79.8 ml 49.03 ml/m  LA Biplane Vol: 78.9 ml 48.48 ml/m  AORTIC VALVE  AV Area (Vmax):  0.40 cm  AV Area (Vmean):  0.43 cm  AV Area (VTI):   0.33 cm  AV Vmax:      421.20 cm/s  AV Vmean:     305.600 cm/s  AV VTI:      0.930 m  AV Peak Grad:   71.0 mmHg  AV Mean Grad:   41.8 mmHg  LVOT Vmax:     74.20 cm/s  LVOT Vmean:    57.300 cm/s  LVOT VTI:     0.137 m  LVOT/AV VTI ratio: 0.15    AORTA  Ao Root diam: 2.70 cm   MITRAL VALVE             TRICUSPID VALVE  MV Area (PHT): 5.02 cm       TR Peak grad:  62.4 mmHg  MV PHT:    43.79 msec      TR Vmax:    395.00 cm/s  MV Decel Time: 151 msec  MR Peak grad:  88.7 mmHg      SHUNTS  MR Mean grad:  54.0 mmHg      Systemic VTI: 0.14 m  MR Vmax:     471.00 cm/s     Systemic Diam: 1.70 cm  MR Vmean:    349.0 cm/s  MR PISA:     1.01 cm  MR PISA Eff ROA: 13 mm  MR PISA Radius: 0.40 cm  MV E velocity: 120.00 cm/s 103 cm/s  MV A velocity: 65.60 cm/s 70.3 cm/s  MV E/A ratio: 1.83    1.5     Skeet Latch MD  Electronically signed by Skeet Latch MD  Signature Date/Time: 12/31/2019/11:43:10 AM      CHEST - 2 VIEW  01/07/20  COMPARISON:  01/05/2020  FINDINGS: Frontal and lateral views of the chest demonstrate interval removal  of the left subclavian catheter and bilateral chest tubes. Epicardial pacing wires remain, a contraindication to MRI. Postsurgical changes from  median sternotomy and aortic valve replacement. Cardiac silhouette is enlarged but stable.  Overall, improved volume status with decreased pleural effusions, resolution of ground-glass airspace disease, and decreased central vascular congestion. No evidence pneumothorax. Minimal subcutaneous gas within the right lateral chest wall related to previous chest tube.  IMPRESSION: 1. Interval removal of left subclavian catheter and bilateral chest tubes. No evidence of pneumothorax. 2. Improved volume status with decreased pleural effusions and decreased central vascular congestion.   Electronically Signed   By: Randa Ngo M.D.   On: 01/07/2020 10:38  Treatments:  CARDIOTHORACIC SURGERY OPERATIVE NOTE  Date of Procedure:    12/31/2019  Preoperative Diagnosis:      Left main Coronary Artery Disease, s/p STEMI; severe AS, paroxysmal atrial fibrillation  Postoperative Diagnosis:    Same  Procedure:        Aortic valve replacement 21 mm Edwards Intuity bovine bioprosthetic  Coronary Artery Bypass Grafting x 3              Left Internal Mammary Artery to Distal Left Anterior Descending Coronary Artery; Saphenous Vein Graft to Posterior Descending Coronary Artery; Saphenous Vein Graft to 1st Obtuse Marginal Branch of Left Circumflex Coronary Artery;; Endoscopic Vein Harvest from left Thigh and Lower Leg; Bilateral pulmonary vein isolation and LAA clipping Completion graft surveillance with indocyanine green fluorescence imaging (SPY); multilevel rib block with exparel/marcaine solution   Surgeon:        B. Murvin Natal, MD  Assistant:       Joellyn Rued PA-C  Anesthesia:    get  Operative Findings: ? Moderately reduced left ventricular systolic function ? Good quality left internal mammary artery conduit ? Good quality saphenous vein conduit ? Good quality target vessels for grafting ? Well-seated AVR ? Moderately depressed RV function with moderate  TR    BRIEF CLINICAL NOTE AND INDICATIONS FOR SURGERY  79 yo man developed relatively sudden onset chest pain yesterday, but he has experienced several months of increasing fatigue. Work-up demonstrated STEMI and he was taken to the cath lab in [facility] where LM CAD was diagnosed. Had IABP inserted and txferred to [facility]. Further work-up has demonstrated severe AS with mean transvalvular gradient approximately 45 mm Hg. He presents for CABG/AVR and atrial ablation given history of afib and high risk for afib postoperatively.   Discharge Exam: Blood pressure 112/61, pulse 66, temperature 98.1 F (36.7 C), temperature source Oral, resp. rate (!) 24, height 5\' 7"  (1.702 m), weight 60 kg, SpO2 96 %.  Physical Exam: General appearance: alert, cooperative and no distress Neurologic: intact Heart: regular rate and rhythm, few PVC's Lungs: Breath sounds clear Extremities: All well perfused, no peripheral edema. Bruisiing both forearms. Wound: The sternal incision is well approximated with skin staples. Left leg incision dry.   Disposition:   Discharge Instructions    Amb Referral to Cardiac Rehabilitation   Complete by: As directed    Diagnosis:  CABG STEMI     CABG X ___: 3   After initial evaluation and assessments completed: Virtual Based Care may be provided alone or in conjunction with Phase 2 Cardiac Rehab based on patient barriers.: Yes     Allergies as of 01/08/2020   No Known Allergies     Medication List    STOP taking these medications   OVER THE COUNTER MEDICATION     TAKE these medications  amiodarone 200 MG tablet Commonly known as: PACERONE Take 1 tablet (200 mg total) by mouth 2 (two) times daily.   apixaban 2.5 MG Tabs tablet Commonly known as: ELIQUIS Take 1 tablet (2.5 mg total) by mouth every 12 (twelve) hours.   aspirin 81 MG chewable tablet Chew 1 tablet (81 mg total) by mouth daily. Start taking on: January 09, 2020   atorvastatin 80 MG  tablet Commonly known as: LIPITOR Take 1 tablet (80 mg total) by mouth daily at 6 PM.   carvedilol 3.125 MG tablet Commonly known as: COREG Take 1 tablet (3.125 mg total) by mouth 2 (two) times daily with a meal.   colchicine 0.6 MG tablet Take 1 tablet (0.6 mg total) by mouth daily. Continue for 2 weeks then stop. Start taking on: January 09, 2020   Digoxin 62.5 MCG Tabs Take 0.0625 mg by mouth daily. Start taking on: January 09, 2020   feeding supplement (ENSURE ENLIVE) Liqd Take 237 mLs by mouth 3 (three) times daily between meals.   furosemide 20 MG tablet Commonly known as: Lasix Take 1 tablet (20 mg total) by mouth daily.   levalbuterol 0.63 MG/3ML nebulizer solution Commonly known as: XOPENEX Take 3 mLs (0.63 mg total) by nebulization every 6 (six) hours.   losartan 25 MG tablet Commonly known as: COZAAR Take 0.5 tablets (12.5 mg total) by mouth daily. Start taking on: January 09, 2020   Melatonin 3 MG Tabs Take 1 tablet (3 mg total) by mouth at bedtime.   oxyCODONE 5 MG immediate release tablet Commonly known as: Oxy IR/ROXICODONE Take 1 tablet (5 mg total) by mouth every 4 (four) hours as needed for up to 5 days for severe pain.   spironolactone 25 MG tablet Commonly known as: ALDACTONE Place 1 tablet (25 mg total) into feeding tube daily. Start taking on: January 09, 2020       The patient has been discharged on:   1.Beta Blocker:  Yes [ x  ]                              No   [   ]                              If No, reason:  2.Ace Inhibitor/ARB: Yes [x   ]                                     No  [    ]                                     If No, reason:  3.Statin:   Yes [ x  ]                  No  [   ]                  If No, reason:  4.Ecasa:  Yes  [ x  ]                  No   [   ]  If No, reason:    Signed: Antony Odea, PA-C 01/08/2020, 4:03 PM

## 2020-01-04 NOTE — Progress Notes (Signed)
Patient ID: David Perez, male   DOB: 05/13/1941, 79 y.o.   MRN: [id number] EVENING ROUNDS NOTE :     [address]       [place] [address]             [phone]                 4 Days Post-Op Procedure(s) (LRB): CORONARY ARTERY BYPASS GRAFTING (CABG) TIMES THREE USING LEFT INTERNAL MAMMARY ARTERY AND LEFT GREATER SAPHENOUS LEG VEIN HARVESTED ENDOSCOPICALLY (N/A) AORTIC VALVE REPLACEMENT (AVR) (N/A) MAZE (N/A) TRANSESOPHAGEAL ECHOCARDIOGRAM (TEE) (N/A) INDOCYANINE GREEN FLUORESCENCE IMAGING (ICG) (N/A) Clipping Of Atrial Appendage (Left)  Total Length of Stay:  LOS: 4 days  BP 138/83   Pulse 83   Temp 98.2 F (36.8 C) (Oral)   Resp 18   Ht 5\' 7"  (1.702 m)   Wt 67 kg   SpO2 100%   BMI 23.13 kg/m   .Intake/Output      01/31 0701 - 02/01 0700 02/01 0701 - 02/02 0700   P.O. 90 20   I.V. (mL/kg) 479.9 (7.2) 133.6 (2)   Blood 315    Other     IV Piggyback  200   Total Intake(mL/kg) 884.9 (13.2) 353.6 (5.3)   Urine (mL/kg/hr) 2565 (1.6) 1130 (1.4)   Stool  0   Chest Tube 510 310   Total Output 3075 1440   Net -2190.1 -1086.4        Stool Occurrence  1 x     . ceFEPime (MAXIPIME) IV    . lactated ringers    . lactated ringers    . lactated ringers 10 mL/hr at 01/04/20 1800  . milrinone 0.25 mcg/kg/min (01/04/20 1800)     Lab Results  Component Value Date   WBC 16.6 (H) 01/04/2020   HGB 10.8 (L) 01/04/2020   HCT 31.8 (L) 01/04/2020   PLT 93 (L) 01/04/2020   GLUCOSE 140 (H) 01/04/2020   CHOL 275 (H) 12/31/2019   TRIG 65 12/31/2019   HDL 75 12/31/2019   LDLCALC 187 (H) 12/31/2019   ALT 655 (H) 01/04/2020   AST 332 (H) 01/04/2020   NA 145 01/04/2020   K 3.9 01/04/2020   CL 106 01/04/2020   CREATININE 1.81 (H) 01/04/2020   BUN 50 (H) 01/04/2020   CO2 28 01/04/2020   TSH 2.539 12/31/2019   INR 2.2 (H) 12/31/2019   HGBA1C 5.2 12/31/2019   Awake and alert knew why he was here  On low dose milrinone  Grace Isaac MD  Beeper  [phone] Office [phone] 01/04/2020 6:43 PM

## 2020-01-05 ENCOUNTER — Inpatient Hospital Stay (HOSPITAL_COMMUNITY): Payer: Medicare Other

## 2020-01-05 LAB — COMPREHENSIVE METABOLIC PANEL
ALT: 826 U/L — ABNORMAL HIGH (ref 0–44)
AST: 376 U/L — ABNORMAL HIGH (ref 15–41)
Albumin: 3 g/dL — ABNORMAL LOW (ref 3.5–5.0)
Alkaline Phosphatase: 66 U/L (ref 38–126)
Anion gap: 10 (ref 5–15)
BUN: 46 mg/dL — ABNORMAL HIGH (ref 8–23)
CO2: 29 mmol/L (ref 22–32)
Calcium: 8 mg/dL — ABNORMAL LOW (ref 8.9–10.3)
Chloride: 108 mmol/L (ref 98–111)
Creatinine, Ser: 1.62 mg/dL — ABNORMAL HIGH (ref 0.61–1.24)
GFR calc Af Amer: 46 mL/min — ABNORMAL LOW (ref >60)
GFR calc non Af Amer: 39 mL/min — ABNORMAL LOW (ref >60)
Glucose, Bld: 104 mg/dL — ABNORMAL HIGH (ref 70–99)
Potassium: 3.7 mmol/L (ref 3.5–5.1)
Sodium: 147 mmol/L — ABNORMAL HIGH (ref 135–145)
Total Bilirubin: 3 mg/dL — ABNORMAL HIGH (ref 0.3–1.2)
Total Protein: 5.3 g/dL — ABNORMAL LOW (ref 6.5–8.1)

## 2020-01-05 LAB — CBC
HCT: 33.7 % — ABNORMAL LOW (ref 39.0–52.0)
Hemoglobin: 11.1 g/dL — ABNORMAL LOW (ref 13.0–17.0)
MCH: 33.1 pg (ref 26.0–34.0)
MCHC: 32.9 g/dL (ref 30.0–36.0)
MCV: 100.6 fL — ABNORMAL HIGH (ref 80.0–100.0)
Platelets: 105 10*3/uL — ABNORMAL LOW (ref 150–400)
RBC: 3.35 MIL/uL — ABNORMAL LOW (ref 4.22–5.81)
RDW: 18.9 % — ABNORMAL HIGH (ref 11.5–15.5)
WBC: 15.1 10*3/uL — ABNORMAL HIGH (ref 4.0–10.5)
nRBC: 4 % — ABNORMAL HIGH (ref 0.0–0.2)

## 2020-01-05 LAB — COOXEMETRY PANEL
Carboxyhemoglobin: 1.7 % — ABNORMAL HIGH (ref 0.5–1.5)
Carboxyhemoglobin: 1.9 % — ABNORMAL HIGH (ref 0.5–1.5)
Methemoglobin: 0.9 % (ref 0.0–1.5)
Methemoglobin: 0.9 % (ref 0.0–1.5)
O2 Saturation: 54.3 %
O2 Saturation: 58 %
Total hemoglobin: 11.4 g/dL — ABNORMAL LOW (ref 12.0–16.0)
Total hemoglobin: 14.6 g/dL (ref 12.0–16.0)

## 2020-01-05 LAB — GLUCOSE, CAPILLARY
Glucose-Capillary: 100 mg/dL — ABNORMAL HIGH (ref 70–99)
Glucose-Capillary: 103 mg/dL — ABNORMAL HIGH (ref 70–99)
Glucose-Capillary: 133 mg/dL — ABNORMAL HIGH (ref 70–99)
Glucose-Capillary: 137 mg/dL — ABNORMAL HIGH (ref 70–99)
Glucose-Capillary: 80 mg/dL (ref 70–99)
Glucose-Capillary: 93 mg/dL (ref 70–99)
Glucose-Capillary: 99 mg/dL (ref 70–99)

## 2020-01-05 MED ORDER — ENOXAPARIN SODIUM 40 MG/0.4ML ~~LOC~~ SOLN
40.0000 mg | SUBCUTANEOUS | Status: DC
  Administered 2020-01-05 – 2020-01-08 (×4): 40 mg via SUBCUTANEOUS
  Filled 2020-01-05 (×4): qty 0.4

## 2020-01-05 MED ORDER — FUROSEMIDE 10 MG/ML IJ SOLN
40.0000 mg | Freq: Once | INTRAMUSCULAR | Status: AC
  Administered 2020-01-05: 40 mg via INTRAVENOUS
  Filled 2020-01-05: qty 4

## 2020-01-05 MED ORDER — POTASSIUM CHLORIDE 10 MEQ/50ML IV SOLN
10.0000 meq | INTRAVENOUS | Status: AC
  Administered 2020-01-05 (×3): 10 meq via INTRAVENOUS
  Filled 2020-01-05 (×3): qty 50

## 2020-01-05 MED ORDER — SODIUM CHLORIDE 0.9% FLUSH
10.0000 mL | Freq: Two times a day (BID) | INTRAVENOUS | Status: DC
  Administered 2020-01-05 – 2020-01-07 (×3): 10 mL

## 2020-01-05 MED ORDER — ASPIRIN 325 MG PO TABS
ORAL_TABLET | ORAL | Status: AC
  Administered 2020-01-05: 325 mg via ORAL
  Filled 2020-01-05: qty 1

## 2020-01-05 MED ORDER — POTASSIUM CHLORIDE 10 MEQ/50ML IV SOLN: 10.0000 meq | INTRAVENOUS | Status: AC

## 2020-01-05 MED ORDER — OSMOLITE 1.2 CAL PO LIQD
1000.0000 mL | ORAL | Status: DC
  Administered 2020-01-05 – 2020-01-06 (×2): 1000 mL
  Filled 2020-01-05 (×2): qty 1000

## 2020-01-05 MED ORDER — ASPIRIN 325 MG PO TABS
325.0000 mg | ORAL_TABLET | Freq: Every day | ORAL | Status: DC
  Administered 2020-01-06 – 2020-01-08 (×3): 325 mg via ORAL
  Filled 2020-01-05 (×3): qty 1

## 2020-01-05 MED ORDER — SODIUM CHLORIDE 0.9% FLUSH: 10.0000 mL | INTRAVENOUS | Status: DC | PRN

## 2020-01-05 MED FILL — Electrolyte-R (PH 7.4) Solution: INTRAVENOUS | Qty: 4000 | Status: AC

## 2020-01-05 MED FILL — Sodium Chloride IV Soln 0.9%: INTRAVENOUS | Qty: 3000 | Status: AC

## 2020-01-05 MED FILL — Sodium Bicarbonate IV Soln 8.4%: INTRAVENOUS | Qty: 50 | Status: AC

## 2020-01-05 MED FILL — Heparin Sodium (Porcine) Inj 1000 Unit/ML: INTRAMUSCULAR | Qty: 10 | Status: AC

## 2020-01-05 NOTE — Progress Notes (Signed)
Patient ID: David Perez, male   DOB: 1941-04-28, 79 y.o.   MRN: [id number]     Advanced Heart Failure Rounding Note  PCP-Cardiologist: No primary care provider on file.   Subjective:    1/28: CABG (LIMA-LAD, SVG-LPDA, SVG-OM1), Maze + LA appendage clip, bioprosthetic AVR.  1/29: Extubated, IABP removed  More alert today.  Failed swallow study again yesterday.   He remains on milrinone 0.25. Off epinephrine, dopamine, and norepinephrine.   Co-ox 54% this morning with CVP 8-9. He is off Lasix.  Creatinine 1.8 => 2 => 1.8 => 1.62.  Oxygen down to 2L [place]. CXR with improved pulmonary edema.   LFTs still elevated.   Hgb 10 => 8.4 => 10.8 => 11.1, plts trending up at 105K.  WBCs down to 15, afebrile. He is on cefepime for ?aspiration.    Echo (pre-op): EF 35% (by my read), severe AS  Objective:   Weight Range: 62.3 kg Body mass index is 21.51 kg/m.   Vital Signs:   Temp:  [97.9 F (36.6 C)-98.5 F (36.9 C)] 97.9 F (36.6 C) (02/02 0400) Pulse Rate:  [78-90] 80 (02/02 0700) Resp:  [15-22] 19 (02/02 0700) BP: (108-154)/(60-99) 138/76 (02/02 0700) SpO2:  [91 %-100 %] 98 % (02/02 0741) Weight:  [62.3 kg] 62.3 kg (02/02 0500) Last BM Date: 01/03/20  Weight change: Filed Weights   01/03/20 0458 01/04/20 0451 01/05/20 0500  Weight: 68.4 kg 67 kg 62.3 kg    Intake/Output:   Intake/Output Summary (Last 24 hours) at 01/05/2020 0751 Last data filed at 01/05/2020 0500 Gross per 24 hour  Intake 620.36 ml  Output 2305 ml  Net -1684.64 ml      Physical Exam    General: NAD Neck: No JVD, no thyromegaly or thyroid nodule.  Lungs: Clear to auscultation bilaterally with normal respiratory effort. CV: Nondisplaced PMI.  Heart regular S1/S2, no S3/S4, 2/6 early SEM.  1+ right ankle edema.   Abdomen: Soft, nontender, no hepatosplenomegaly, no distention.  Skin: Intact without lesions or rashes.  Neurologic: Alert and oriented x 3.  Psych: Normal affect. Extremities: No clubbing or  cyanosis.  HEENT: Normal.    Telemetry   NSR with LBBB around 80 (personally reviewed)  Labs    CBC Recent Labs    01/04/20 0343 01/05/20 0320  WBC 16.6* 15.1*  HGB 10.8* 11.1*  HCT 31.8* 33.7*  MCV 98.1 100.6*  PLT 93* 491*   Basic Metabolic Panel Recent Labs    01/04/20 0343 01/05/20 0320  NA 145 147*  K 3.9 3.7  CL 106 108  CO2 28 29  GLUCOSE 140* 104*  BUN 50* 46*  CREATININE 1.81* 1.62*  CALCIUM 8.0* 8.0*   Liver Function Tests Recent Labs    01/04/20 0343 01/05/20 0320  AST 332* 376*  ALT 655* 826*  ALKPHOS 59 66  BILITOT 3.1* 3.0*  PROT 5.3* 5.3*  ALBUMIN 3.2* 3.0*   No results for input(s): LIPASE, AMYLASE in the last 72 hours. Cardiac Enzymes No results for input(s): CKTOTAL, CKMB, CKMBINDEX, TROPONINI in the last 72 hours.  BNP: BNP (last 3 results) No results for input(s): BNP in the last 8760 hours.  ProBNP (last 3 results) No results for input(s): PROBNP in the last 8760 hours.   D-Dimer No results for input(s): DDIMER in the last 72 hours. Hemoglobin A1C No results for input(s): HGBA1C in the last 72 hours. Fasting Lipid Panel No results for input(s): CHOL, HDL, LDLCALC, TRIG, CHOLHDL, LDLDIRECT in the last  72 hours. Thyroid Function Tests No results for input(s): TSH, T4TOTAL, T3FREE, THYROIDAB in the last 72 hours.  Invalid input(s): FREET3  Other results:   Imaging    No results found.   Medications:     Scheduled Medications: . sodium chloride   Intravenous Once  . sodium chloride   Intravenous Once  . sodium chloride   Intravenous Once  . acetaminophen  1,000 mg Oral Q6H   Or  . acetaminophen (TYLENOL) oral liquid 160 mg/5 mL  1,000 mg Per Tube Q6H  . amiodarone  200 mg Oral BID  . aspirin  300 mg Rectal Daily  . atorvastatin  80 mg Oral q1800  . bisacodyl  10 mg Oral Daily   Or  . bisacodyl  10 mg Rectal Daily  . chlorhexidine gluconate (MEDLINE KIT)  15 mL Mouth Rinse BID  . Chlorhexidine Gluconate  Cloth  6 each Topical Daily  . docusate sodium  200 mg Oral Daily  . enoxaparin (LOVENOX) injection  30 mg Subcutaneous Q24H  . feeding supplement (ENSURE ENLIVE)  237 mL Oral BID BM  . furosemide  40 mg Intravenous Once  . insulin aspart  0-24 Units Subcutaneous Q4H  . levalbuterol  0.63 mg Nebulization TID  . magic mouthwash  5 mL Oral TID  . pantoprazole (PROTONIX) IV  40 mg Intravenous Q24H  . sodium chloride flush  3 mL Intravenous Q12H    Infusions: . ceFEPime (MAXIPIME) IV Stopped (01/04/20 2242)  . lactated ringers    . lactated ringers    . lactated ringers Stopped (01/05/20 0451)  . milrinone 0.25 mcg/kg/min (01/05/20 0500)  . potassium chloride 10 mEq (01/05/20 0703)  . potassium chloride      PRN Medications: lactated ringers, metoprolol tartrate, ondansetron (ZOFRAN) IV, oxyCODONE, sodium chloride flush   Assessment/Plan   1. CAD: STEMI with very short LM and 99% ostial LAD. PCI thought to risk compromise of ostial large LCx, so decision made to defer PCI and transfer to [facility] for CABG as patient was CP-free at that point. Now s/p CABG with LIMA-LAD, SVG-OM2, SVG-LPDA.   - Continue ASA.  - Continue atorvastatin 80 mg daily when he can take po.  2. Cardiogenic shock: Pre-op echo with EF 35%.  Now s/p CABG-AVR.  He is on milrinone 0.25. CVP 8-9, co-ox 54%.  - Will decrease milrinone to 0.125 today, repeat co-ox around noon.   - He cannot get po, still some fluid on CXR.  Will give Lasix 40 mg IV x 1 with KCl.  3. Atrial fibrillation: S/p Maze, LA appendage clip.  NSR today.  - Amiodarone gtt is off, continue amiodarone 200 mg bid (when he can take pills)   - Will need eventual anticoagulation, use DVT prophylaxis Lovenox for now.  4. Hyperlipidemia: atorvastatin when taking pills.   5. Thrombocytopenia: Post-op, trending up.  6. Acute hypoxemic respiratory failure: Pulmonary edema. Now on 2L [place].  - Improved.  7. AKI: Creatinine trending down 1.6.  8. Neuro:  Agitated delirium with paranoia.  Daughter is able to re-orient. Improved today.  9. ID: WBCs 25 => 19 => 16.6 => 15, afebrile.  On cefepime.  - Cultures NGTD  10. Anemia: Post-op, got 1 unit PRBCs 1/31. Stable.  11. Dysphagia: Failed swallow again yesterday, reassess today.  Will need Cortrak if fails.  12. Elevated LFTs: ?Shock liver from hypotensive episode immediately post-op.  He has not had statin or amiodarone as he cannot swallow.  - RUQ [name]  this morning.   63. Mobilize, up to chair when he is awake.  Work with PT.     CRITICAL CARE Performed by: Loralie Champagne  Total critical care time: 35 minutes  Critical care time was exclusive of separately billable procedures and treating other patients.  Critical care was necessary to treat or prevent imminent or life-threatening deterioration.  Critical care was time spent personally by me on the following activities: development of treatment plan with patient and/or surrogate as well as nursing, discussions with consultants, evaluation of patient's response to treatment, examination of patient, obtaining history from patient or surrogate, ordering and performing treatments and interventions, ordering and review of laboratory studies, ordering and review of radiographic studies, pulse oximetry and re-evaluation of patient's condition.   Length of Stay: 5  Loralie Champagne, MD  01/05/2020, 7:51 AM  Advanced Heart Failure Team Pager [phone] (M-F; 7a - 4p)  Please contact [facility] for night-coverage after hours (4p -7a ) and weekends on [email or web address]

## 2020-01-05 NOTE — Progress Notes (Signed)
  Speech Language Pathology Treatment: Dysphagia  Patient Details Name: David Perez MRN: [id number] DOB: Dec 24, 1940 Today's Date: 01/05/2020 Time: [id number] SLP Time Calculation (min) (ACUTE ONLY): 28 min  Assessment / Plan / Recommendation Clinical Impression  Pt was seen for dysphagia treatment with his daughter present. He was alert and cooperative throughout the session. Oral care provided prior to session by RN. Pt tolerated ice chips and thin liquids via tsp without overt s/sx of aspiration. He tolerated 4 trials of thin liquids via cup and 3/5 trials of thin liquids via straw without overt s/sx of aspiration. Solids were tolerated without s/sx of aspiration but secondary swallows were noted with full teaspoons of puree and with dysphagia 2 solids, suggesting possible pharyngeal residue. Overall, his swallow mechanism appears further improved compared to that which was noted yesterday as evidenced by tolerance of larger boluses of solids without symptomatology and reduced signs of aspiration. Cortrak was placed this morning and it is recommended that non-oral alimentation be used at this time. However, pt may have ice chips and water via tsp following oral care. SLP will continue to follow pt. Prognosis for advancement is good at this time.    HPI HPI: Pt is an 79 y.o. with minimal past history who was transferred by Dr. Clayborn Bigness from [facility] tonight for evaluation for CABG.  He has done well in general, remains very active, lives alone in [place], and has been taking no medications.  No history of cardiac problems. Pt admitted for STEMI following pressure in chest 1/27, now s/p CABG. Intubated approx 26 hours for procedure and following.  CXR 1/31 showing B opacities consistent with pulmonary edema.      SLP Plan  Continue with current plan of care       Recommendations  Diet recommendations: NPO(Ice chips & water via tsp following oral care) Medication Administration: Via alternative  means                Oral Care Recommendations: Oral care prior to ice chip/H20 SLP Visit Diagnosis: Dysphagia, oropharyngeal phase (R13.12) Plan: Continue with current plan of care       Malcomb Gangemi I. Hardin Negus, [place], [place] Office number [phone] Pager [phone]                Horton Marshall 01/05/2020, 12:39 PM

## 2020-01-05 NOTE — Progress Notes (Signed)
patient complaining of being hungry, keep reiterating why he cannot eat. Frequent mouth care done, gave ice chips and sips with nurse in attendance. Did not cough with them.  Tried to wean o2 off, but desaturates to 87%, left on 1 L. Foley d/c instructed on protocol for foley removal. Patient believes he can urinate in urinal fine.

## 2020-01-05 NOTE — PMR Pre-admission (Signed)
PMR Admission Coordinator Pre-Admission Assessment  Patient: Keyan Folson is an 79 y.o., male MRN: [id number] DOB: 08-16-1941 Height: '5\' 7"'  (170.2 cm) Weight: 60 kg  Insurance Information HMO:     PPO:      PCP:      IPA:      80/20: yes     OTHER:  PRIMARY: Medicare Part A and B      Policy#: [id number]      Subscriber: patient CM Name:       Phone#:      Fax#:  Pre-Cert#:       Employer:  Benefits:  Phone #: online     Name: verified eligibility online via [employer] on 01/08/20 Eff. Date: Part A and B effective 01/04/2004     Deduct: $1,484      Out of Pocket Max: NA      Life Max: NA CIR: Covered per Medicare guidelines once yearly deductible has been met      SNF: days 1-20, 100%, days 21-100, 80%    Outpatient: 80%     Co-Pay: 20% Home Health: 100%      Co-Pay:  DME: 80%     Co-Pay: 20% Providers: Pt's choice SECONDARY: None      Policy#:       Subscriber:  CM Name:       Phone#:      Fax#:  Pre-Cert#:       Employer:  Benefits:  Phone #:      Name:  Eff. Date:      Deduct:       Out of Pocket Max:       Life Max:  CIR:       SNF:  Outpatient:      Co-Pay:  Home Health:       Co-Pay:  DME:      Co-Pay:   Medicaid Application Date:       Case Manager:  Disability Application Date:       Case Worker:   The "Data Collection Information Summary" for patients in Inpatient Rehabilitation Facilities with attached "Privacy Act [place] Records" was provided and verbally reviewed with: Patient and Family  Emergency Contact Information Contact Information    Name Relation Home Work Mobile   Staffod,[name] Daughter   [phone]      Current Medical History  Patient Admitting Diagnosis: Cardiac debility   History of Present Illness: Al Trefz is an 79 year old male in relatively good health who was originally admitted via [facility] on 12/30/2018 with sudden onset of chest pain due to STEMI.  He underwent cardiac cath emergently revealing aortic stenosis and ostial LAD  lesion not felt to be optimal for PCI therefore IAPB placed and he was transferred to [facility] for management.  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 He was evaluated by Dr. Ahmed Prima and taken to the OR emergently for CABG X 3 with AVR and clipping of atrial appendage on the same day.  Post extubation had hypoxia requiring BiPAP as well as hypotension and A. fib with RVR.  He required Precedex due to delirium with agitation.  Acute hypoxic respiratory failure felt to be due to pulmonary edema and treated with diuresis.  Acute blood loss anemia with thrombocytopenia and leukocytosis of 25,000 as well as abnormal LFTs was monitored with serial checks. Urine culture/blood cultures have been negative.    Mentation has fluctuated  due to ongoing delirium and he was kept n.p.o with tube feeds till mentation improved.  Speech therapy following for input and diet advanced to regular.  He developed pleuritic chest pain and found to have a friction rub 2/3. 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 Heart failure following patient to assist with management of fluid overload--milrinone d/c 2/3 with addition of digoxin and amiodarone changed to po route. CHF team following to optimize medication--Losartan added 2/04  with increase in spironolactone to 25 mg daily. Therapy ongoing and CIR recommended due to debility. Pt is to admit to CIR on 01/08/20.      Patient's medical record from [facility] has been reviewed by the rehabilitation admission coordinator and physician.  Past Medical History  Past Medical History:  Diagnosis Date  . Hepatitis B     Family History   family history is not on file.  Prior  Rehab/Hospitalizations Has the patient had prior rehab or hospitalizations prior to admission? No  Has the patient had major surgery during 100 days prior to admission? Yes   Current Medications  Current Facility-Administered Medications:  .  0.9 %  sodium chloride infusion (Manually program via Guardrails IV Fluids), , Intravenous, Once, Atkins, Glenice Bow, MD .  amiodarone (PACERONE) tablet 200 mg, 200 mg, Oral, BID, Orvan Seen, Glenice Bow, MD, 200 mg at 01/08/20 0849 .  apixaban (ELIQUIS) tablet 2.5 mg, 2.5 mg, Oral, Q12H, Larey Dresser, MD .  Derrill Memo ON 01/09/2020] aspirin chewable tablet 81 mg, 81 mg, Oral, Daily, Larey Dresser, MD .  atorvastatin (LIPITOR) tablet 80 mg, 80 mg, Oral, q1800, Wonda Olds, MD, 80 mg at 01/07/20 1719 .  bisacodyl (DULCOLAX) EC tablet 10 mg, 10 mg, Oral, Daily, 10 mg at 01/06/20 0854 **OR** bisacodyl (DULCOLAX) suppository 10 mg, 10 mg, Rectal, Daily, Orvan Seen, Glenice Bow, MD, 10 mg at 01/04/20 1711 .  bisacodyl (DULCOLAX) suppository 10 mg, 10 mg, Rectal, Once, Atkins, Broadus Z, MD .  carvedilol (COREG) tablet 3.125 mg, 3.125 mg, Oral, BID WC, Larey Dresser, MD, 3.125 mg at 01/08/20 0849 .  chlorhexidine gluconate (MEDLINE KIT) (PERIDEX) 0.12 % solution 15 mL, 15 mL, Mouth Rinse, BID, Atkins, Glenice Bow, MD, 15 mL at 01/08/20 0851 .  Chlorhexidine Gluconate Cloth 2 % PADS 6 each, 6 each, Topical, Daily, Orvan Seen, Glenice Bow, MD, 6 each at 01/07/20 0900 .  colchicine tablet 0.6 mg, 0.6 mg, Oral, Daily, Orvan Seen, Glenice Bow, MD, 0.6 mg at 01/08/20 0849 .  digoxin (LANOXIN) tablet 0.0625 mg, 0.0625 mg, Oral, Daily, Orvan Seen, Glenice Bow, MD, 0.0625 mg at 01/08/20 0848 .  docusate sodium (COLACE) capsule 100 mg, 100 mg, Oral, BID, Atkins, Broadus Z, MD .  feeding supplement (ENSURE ENLIVE) (ENSURE ENLIVE) liquid 237 mL, 237 mL, Oral, TID BM, Atkins, Glenice Bow, MD, 237 mL at 01/08/20 0852 .  insulin aspart (novoLOG) injection 0-24 Units, 0-24 Units, Subcutaneous, Q4H,  Orvan Seen, Glenice Bow, MD, 2 Units at 01/08/20 0855 .  levalbuterol (XOPENEX) nebulizer solution 0.63 mg, 0.63 mg, Nebulization, Q6H PRN, Atkins, Broadus Z, MD .  levalbuterol (XOPENEX) nebulizer solution 0.63 mg, 0.63 mg, Nebulization, Q6H, Atkins, Glenice Bow, MD, 0.63 mg at 01/08/20 1449 .  losartan (COZAAR) tablet 12.5 mg, 12.5 mg, Oral, Daily, Clegg, Amy D, NP, 12.5 mg at 01/08/20 0849 .  Melatonin TABS 3  mg, 3 mg, Oral, QHS, Atkins, Glenice Bow, MD, 3 mg at 01/07/20 2244 .  metoprolol tartrate (LOPRESSOR) injection 2.5-5 mg, 2.5-5 mg, Intravenous, Q2H PRN, Atkins, Broadus Z, MD .  ondansetron (ZOFRAN) injection 4 mg, 4 mg, Intravenous, Q6H PRN, Wonda Olds, MD, 4 mg at 01/01/20 2217 .  oxyCODONE (Oxy IR/ROXICODONE) immediate release tablet 5-10 mg, 5-10 mg, Oral, Q3H PRN, Atkins, Broadus Z, MD .  pantoprazole (PROTONIX) injection 40 mg, 40 mg, Intravenous, Q24H, Atkins, Glenice Bow, MD, 40 mg at 01/07/20 2245 .  polyethylene glycol (MIRALAX / GLYCOLAX) packet 17 g, 17 g, Oral, Daily, Atkins, Broadus Z, MD .  sodium chloride flush (NS) 0.9 % injection 10-40 mL, 10-40 mL, Intracatheter, PRN, Atkins, Broadus Z, MD .  sodium chloride flush (NS) 0.9 % injection 3 mL, 3 mL, Intravenous, Q12H, Atkins, Glenice Bow, MD, 3 mL at 01/08/20 0852 .  spironolactone (ALDACTONE) tablet 25 mg, 25 mg, Per Tube, Daily, Larey Dresser, MD, 25 mg at 01/08/20 0848  Patients Current Diet:  Diet Order            Diet Heart Room service appropriate? Yes; Fluid consistency: Thin  Diet effective now              Precautions / Restrictions Precautions Precautions: Sternal, Fall Precaution Booklet Issued: No Precaution Comments: Reviewed sternal precautions Restrictions Weight Bearing Restrictions: Yes Other Position/Activity Restrictions: sternal precautions   Has the patient had 2 or more falls or a fall with injury in the past year? No  Prior Activity Level Community (5-7x/wk): Independent PTA, no AD use.  Pt lived alone, did his own shopping/errands. still drove  Prior Functional Level Self Care: Did the patient need help bathing, dressing, using the toilet or eating? Independent  Indoor Mobility: Did the patient need assistance with walking from room to room (with or without device)? Independent  Stairs: Did the patient need assistance with internal or external stairs (with or without device)? Independent  Functional Cognition: Did the patient need help planning regular tasks such as shopping or remembering to take medications? Independent  Home Assistive Devices / Equipment Home Assistive Devices/Equipment: None Home Equipment: None  Prior Device Use: Indicate devices/aids used by the patient prior to current illness, exacerbation or injury? None of the above  Current Functional Level Cognition  Overall Cognitive Status: Within Functional Limits for tasks assessed Current Attention Level: Selective Orientation Level: Oriented X4 Safety/Judgement: Decreased awareness of deficits, Decreased awareness of safety General Comments: Pt motivated and cooperative. Shocked that it was Tuesday. Knew it was february. Irritated that he cannot eat or drink. Needs cues to adhere to precautions during mobility.    Extremity Assessment (includes Sensation/Coordination)  Upper Extremity Assessment: Generalized weakness(limited due to sternal precautions)  Lower Extremity Assessment: Defer to PT evaluation    ADLs  Overall ADL's : Needs assistance/impaired Eating/Feeding: NPO Grooming: Wash/dry hands, Min guard, Standing, Wash/dry face Upper Body Bathing: Min guard, Sitting Lower Body Bathing: Maximal assistance, Sit to/from stand Upper Body Dressing : Minimal assistance, Sitting Lower Body Dressing: Maximal assistance, Sit to/from stand Toilet Transfer: Regular Toilet, Ambulation, RW, Cueing for safety, Cueing for sequencing, Moderate assistance Toilet Transfer Details (indicate cue type and  reason): Min A to descend onto toilet, with cues required to adhere to precautions, due to lowered surface, pt required mod A to stand and further cues to not utilize grab bars Toileting- Water quality scientist and Hygiene: Supervision/safety, Cueing for sequencing, Cueing for safety Toileting - Clothing Manipulation Details (  indicate cue type and reason): pt now able to complete pericare safely with sternal precautions Functional mobility during ADLs: Min guard, Cueing for safety, Cueing for sequencing, Rolling walker    Mobility  Overal bed mobility: Needs Assistance Bed Mobility: Rolling, Sidelying to Sit Rolling: Supervision Sidelying to sit: Min assist Supine to sit: Min assist, HOB elevated Sit to supine: Mod assist General bed mobility comments: Up in chair upon PT arrival.    Transfers  Overall transfer level: Needs assistance Equipment used: Rolling walker (2 wheeled) Transfers: Sit to/from Stand Sit to Stand: Mod assist, Min assist Stand pivot transfers: Min assist, +2 safety/equipment General transfer comment: Verbal cues provided for hand placement and to adhere to sternal precautions, reiterated use of momentum to stand (especially from low toilet surface), due to low surface, required mod A in order to stand from toilet    Ambulation / Gait / Stairs / Wheelchair Mobility  Ambulation/Gait Ambulation/Gait assistance: Counsellor (Feet): 75 Feet Assistive device: Rolling walker (2 wheeled) Gait Pattern/deviations: Step-through pattern, Decreased stride length General Gait Details: Slow, mostly steady gait wtih RW (adjusted height). 2-3/4 DOE. Reports BLE weakness and fatigue needing to sit. Sp02 remained >88% on RA, difficulty getting accurate pleth reading due to cold fingers. Gait velocity: reduced Gait velocity interpretation: <1.8 ft/sec, indicate of risk for recurrent falls    Posture / Balance Dynamic Sitting Balance Sitting balance - Comments:  supervision Balance Overall balance assessment: Needs assistance Sitting-balance support: Feet supported, No upper extremity supported Sitting balance-Leahy Scale: Good Sitting balance - Comments: supervision Standing balance support: During functional activity Standing balance-Leahy Scale: Fair Standing balance comment: min gaurd when completing functional ADL tasks    Special needs/care consideration BiPAP/CPAP: no CPM : no Continuous Drip IV : no Dialysis : no        Days : no Life Vest : no Oxygen : no, on RA Special Bed : no Trach Size : no Wound Vac (area) : no      Location : no Skin: blister to left thigh, skin tear to right arm, chest, buttocks, hip; surgical incision to sternum, left leg incision, distal arm lower posterior right skin tear.                  Bowel mgmt: last BM 01/08/20, continent Bladder mgmt: continent Diabetic mgmt: no Behavioral consideration : no Chemo/radiation : no   Previous Home Environment (from acute therapy documentation) Living Arrangements: Alone Available Help at Discharge: (per PT eval, 2 dtrs live nearby & working on 24hr schedule) Type of Home: House Home Layout: One level Home Access: Level entry Bathroom Shower/Tub: Chiropodist: Standard Home Care Services: No  Discharge Living Setting Plans for Discharge Living Setting: Patient's home, Alone, House Type of Home at Discharge: House Discharge Home Layout: One level Discharge Home Access: Stairs to enter Entrance Stairs-Rails: None Entrance Stairs-Number of Steps: 2 Discharge Bathroom Shower/Tub: Tub/shower unit Discharge Bathroom Toilet: Standard Discharge Bathroom Accessibility: Yes How Accessible: Accessible via walker Does the patient have any problems obtaining your medications?: No  Social/Family/Support Systems Patient Roles: Other (Comment)(has supportive daughters nearby) Sport and exercise psychologist Information: [name] (daughter): [phone] Anticipated  Caregiver: daughters [name] and [place]) Anticipated Caregiver's Contact Information: see above Ability/Limitations of Caregiver: Supervision Caregiver Availability: 24/7 Discharge Plan Discussed with Primary Caregiver: Yes(confirmed with pt and daughter [name]) Is Caregiver In Agreement with Plan?: Yes Does Caregiver/Family have Issues with Lodging/Transportation while Pt is in Rehab?: No  Goals/Additional Needs Patient/Family Goal for  Rehab: PT/OT: Mod I/Supervision SLP: NA Expected length of stay: 6-8 days Cultural Considerations: no Dietary Needs: heart healthy, thin liquids.  Equipment Needs: TBD Pt/Family Agrees to Admission and willing to participate: Yes Program Orientation Provided & Reviewed with Pt/Caregiver Including Roles  & Responsibilities: Yes(pt and his daugther [name])  Barriers to Discharge: Home environment access/layout  Barriers to Discharge Comments: steps to enter  Decrease burden of Care through IP rehab admission: NA  Possible need for SNF placement upon discharge: Not anticipated. Pt has good social support at DC from his daughters. Anticipate pt can reach supervision level quickly through CIR and be able to return home safely.   Patient Condition: I have reviewed medical records from [facility], spoken with MD, RN, and patient and daughter. I met with patient at the bedside for inpatient rehabilitation assessment.  Patient will benefit from ongoing PT, OT and SLP, can actively participate in 3 hours of therapy a day 5 days of the week, and can make measurable gains during the admission.  Patient will also benefit from the coordinated team approach during an Inpatient Acute Rehabilitation admission.  The patient will receive intensive therapy as well as Rehabilitation physician, nursing, social worker, and care management interventions.  Due to safety, skin/wound care, disease management, medication administration, pain management and patient  education the patient requires 24 hour a day rehabilitation nursing.  The patient is currently Min/Mod A for transfers and Min G with mobility and Min G to Mod A for basic ADLs.  Discharge setting and therapy post discharge at home with home health is anticipated.  Patient has agreed to participate in the Acute Inpatient Rehabilitation Program and will admit 01/08/20.  Preadmission Screen Completed By:  Raechel Ache, 01/08/2020 4:29 PM ______________________________________________________________________   Discussed status with Dr. Ranell Patrick on 01/08/20 at 4:29PM and received approval for admission today.  Admission Coordinator:  Raechel Ache, OT, time 4:29PM/Date 01/08/20   Assessment/Plan: Diagnosis: Cardiac debility 1. Does the need for close, 24 hr/day Medical supervision in concert with the patient's rehab needs make it unreasonable for this patient to be served in a less intensive setting? Yes 2. Co-Morbidities requiring supervision/potential complications: s/p CABG x3. STEMI involving left main coronary artery, thrombocytopenia, acute kidney injury, elevated PFTs, history of PAF, hypernatremia 3. Due to safety, skin/wound care, disease management, medication administration, pain management and patient education, does the patient require 24 hr/day rehab nursing? Yes 4. Does the patient require coordinated care of a physician, rehab nurse, PT, OT to address physical and functional deficits in the context of the above medical diagnosis(es)? Yes Addressing deficits in the following areas: balance, endurance, locomotion, strength, transferring, bathing, dressing, feeding, grooming, toileting and psychosocial support 5. Can the patient actively participate in an intensive therapy program of at least 3 hrs of therapy 5 days a week? Yes 6. The potential for patient to make measurable gains while on inpatient rehab is excellent 7. Anticipated functional outcomes upon discharge from inpatient rehab: modified  independent PT, modified independent OT, independent SLP 8. Estimated rehab length of stay to reach the above functional goals is: 10-14 days 9. Anticipated discharge destination: Home 10. Overall Rehab/Functional Prognosis: excellent   MD Signature: Leeroy Cha, MD

## 2020-01-05 NOTE — Procedures (Signed)
Cortrak  Person Inserting Tube:  Maylon Peppers C, RD Tube Type:  Cortrak - 43 inches Tube Location:  Left nare Initial Placement:  Postpyloric Secured by: Bridle Technique Used to Measure Tube Placement:  Documented cm marking at nare/ corner of mouth Cortrak Secured At:  67 cm    Cortrak Tube Team Note:  Consult received to place a Cortrak feeding tube.   No x-ray is required. RN may begin using tube.   If the tube becomes dislodged please keep the tube and contact the Cortrak team at [email or web address] ([email or web address]) for replacement.  If after hours and replacement cannot be delayed, place a NG tube and confirm placement with an abdominal x-ray.    [place], [place], [place] Pager [phone] After Hours Pager

## 2020-01-05 NOTE — Plan of Care (Signed)
Patient mentation better, still forgetful but reorients easily, not paranoid. Still has sleep disturbance.  Ineffective airway clearance related to weak cough, fluid  Encourage cough and deep breathing with expectoration Use of IS self motivated  Medicate with diuretics as ordered, monitor urine output and document.  Measure CVP Q 4H Note color and frequency of sputum- alert MD with any changes Wean down o2 as able, watch o2 saturations closely  Inadequate nutrition related to swallowing deficiency Only small teaspoons of water PRN- observe patient for any coughing Make sure he is sitting upright when taking ice chips Maintain feeding via Cortrak. Increase as tolerated per recommendations Note and document any feelings of nausea, that he is not tolerating TF Monitor output and stools and document

## 2020-01-05 NOTE — Progress Notes (Signed)
EVENING ROUNDS NOTE :     [address]       [place], [address]             [phone]                 5 Days Post-Op Procedure(s) (LRB): CORONARY ARTERY BYPASS GRAFTING (CABG) TIMES THREE USING LEFT INTERNAL MAMMARY ARTERY AND LEFT GREATER SAPHENOUS LEG VEIN HARVESTED ENDOSCOPICALLY (N/A) AORTIC VALVE REPLACEMENT (AVR) (N/A) MAZE (N/A) TRANSESOPHAGEAL ECHOCARDIOGRAM (TEE) (N/A) INDOCYANINE GREEN FLUORESCENCE IMAGING (ICG) (N/A) Clipping Of Atrial Appendage (Left)   Total Length of Stay:  LOS: 5 days  Events:  No events Good uop today Remains on some milr    BP 128/70   Pulse 87   Temp (!) 97.5 F (36.4 C) (Axillary)   Resp (!) 24   Ht 5\' 7"  (1.702 m)   Wt 62.3 kg   SpO2 94%   BMI 21.51 kg/m   CVP:  [6 mmHg] 6 mmHg     . ceFEPime (MAXIPIME) IV 2 g (01/05/20 1135)  . feeding supplement (OSMOLITE 1.2 CAL) 1,000 mL (01/05/20 1241)  . lactated ringers    . lactated ringers    . lactated ringers Stopped (01/05/20 0451)  . milrinone 0.125 mcg/kg/min (01/05/20 0750)    I/O last 3 completed shifts: In: 829.9 [P.O.:20; I.V.:503.1; IV Piggyback:306.7] Out: 4165 [Urine:3445; Chest Tube:720]   CBC Latest Ref Rng & Units 01/05/2020 01/04/2020 01/03/2020  WBC 4.0 - 10.5 K/uL 15.1(H) 16.6(H) 19.1(H)  Hemoglobin 13.0 - 17.0 g/dL 11.1(L) 10.8(L) 8.4(L)  Hematocrit 39.0 - 52.0 % 33.7(L) 31.8(L) 25.0(L)  Platelets 150 - 400 K/uL 105(L) 93(L) 88(L)    BMP Latest Ref Rng & Units 01/05/2020 01/04/2020 01/03/2020  Glucose 70 - 99 mg/dL 104(H) 140(H) 163(H)  BUN 8 - 23 mg/dL 46(H) 50(H) 51(H)  Creatinine 0.61 - 1.24 mg/dL 1.62(H) 1.81(H) 2.00(H)  Sodium 135 - 145 mmol/L 147(H) 145 145  Potassium 3.5 - 5.1 mmol/L 3.7 3.9 4.3  Chloride 98 - 111 mmol/L 108 106 104  CO2 22 - 32 mmol/L 29 28 28   Calcium 8.9 - 10.3 mg/dL 8.0(L) 8.0(L) 8.0(L)    ABG    Component Value Date/Time   PHART 7.509 (H) 01/01/2020 1514   PCO2ART 37.5 01/01/2020 1514   PO2ART 59.0 (L)  01/01/2020 1514   HCO3 29.9 (H) 01/01/2020 1514   TCO2 31 01/01/2020 1514   ACIDBASEDEF 5.0 (H) 12/31/2019 2230   O2SAT 58.0 01/05/2020 [place] [address], MD 01/05/2020 5:30 PM

## 2020-01-05 NOTE — Evaluation (Signed)
Occupational Therapy Evaluation Patient Details Name: David Perez MRN: [id number] DOB: August 20, 1941 Today's Date: 01/05/2020    History of Present Illness Pt is an 79 y.o. 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 Extubated and IABP removed on 1/29. CXR with improved pulmonary edema. PMH includes Hep B.   Clinical Impression   This 79 y/o male presents with the above. PTA pt reports independence with ADL, iADL and functional mobility, reports was living alone and very active. Pt very pleasant and willing to work with therapy. Pt currently requiring overall minA (+2 lines/safety) with functional transfers and mobility using RW and use of eva walker for hallway level mobility. He currently requires minA for seated UB ADL and maxA for toileting, LB ADL. Pt fatigued post activity but overall tolerating well. Provided continued education and pt requiring cues to adhere to/maintain sternal precautions throughout session. VSS throughout on 2L O2. Pt will benefit from continued acute OT services and feel he is an excellent candidate for CIR level therapies at time of discharge (pending progress and length of stay, may progress to home with [facility] therapies). Will follow.     Follow Up Recommendations  CIR;Supervision/Assistance - 24 hour    Equipment Recommendations  Other (comment)(TBD)           Precautions / Restrictions Precautions Precautions: Sternal;Fall Precaution Booklet Issued: No Precaution Comments: Reviewed sternal precautions Restrictions Weight Bearing Restrictions: Yes Other Position/Activity Restrictions: sternal precautions      Mobility Bed Mobility Overal bed mobility: Needs Assistance Bed Mobility: Supine to Sit     Supine to sit: Min assist;HOB elevated     General bed mobility comments: Able to bring LEs to EOB, assist needed with trunk. Cues to not use UEs for mobility.  Transfers Overall transfer level:  Needs assistance Equipment used: Rolling walker (2 wheeled) Transfers: Sit to/from [facility] Sit to Stand: Min assist;+2 safety/equipment Stand pivot transfers: Min assist;+2 safety/equipment       General transfer comment: ASsist to power to standing with cues for technique and hands on lap with use of momentum, stood from EOB x1, SPT bed to [facility], stood from [employer], transferred to chair post mobility    Balance Overall balance assessment: Needs assistance Sitting-balance support: Feet supported;No upper extremity supported Sitting balance-Leahy Scale: Fair Sitting balance - Comments: supervision   Standing balance support: During functional activity Standing balance-Leahy Scale: Poor Standing balance comment: reliant on UE support and Min A at times                           ADL either performed or assessed with clinical judgement   ADL Overall ADL's : Needs assistance/impaired Eating/Feeding: NPO   Grooming: Set up;Min guard;Sitting   Upper Body Bathing: Min guard;Sitting   Lower Body Bathing: Maximal assistance;Sit to/from stand   Upper Body Dressing : Minimal assistance;Sitting   Lower Body Dressing: Maximal assistance;Sit to/from stand   Toilet Transfer: Minimal assistance;+2 for safety/equipment;Stand-pivot;BSC;RW   Toileting- Clothing Manipulation and Hygiene: Maximal assistance;+2 for safety/equipment;Sit to/from stand Toileting - Clothing Manipulation Details (indicate cue type and reason): assist for pericare after BM given sternal precautions     Functional mobility during ADLs: Minimal assistance;+2 for safety/equipment;Rolling walker(and use of eva walker)  Pertinent Vitals/Pain Pain Assessment: No/denies pain     Hand Dominance Right   Extremity/Trunk Assessment Upper Extremity Assessment Upper Extremity Assessment: Generalized weakness(limited due to sternal precautions)   Lower  Extremity Assessment Lower Extremity Assessment: Defer to PT evaluation   Cervical / Trunk Assessment Cervical / Trunk Assessment: Normal   Communication Communication Communication: No difficulties   Cognition Arousal/Alertness: Awake/alert Behavior During Therapy: WFL for tasks assessed/performed;Impulsive Overall Cognitive Status: No family/caregiver present to determine baseline cognitive functioning Area of Impairment: Memory;Safety/judgement                   Current Attention Level: Selective Memory: Decreased recall of precautions   Safety/Judgement: Decreased awareness of deficits;Decreased awareness of safety     General Comments: Pt motivated and cooperative. Shocked that it was Tuesday. Knew it was february. Irritated that he cannot eat or drink. Needs cues to adhere to precautions during mobility.   General Comments  VSS throughout, pt on 2L O2    Exercises     Shoulder Instructions      Home Living Family/patient expects to be discharged to:: Private residence Living Arrangements: Alone Available Help at Discharge: (per PT eval, 2 dtrs live nearby & working on 24hr schedule) Type of Home: House Home Access: Level entry     Home Layout: One level     Bathroom Shower/Tub: Teacher, early years/pre: Standard     Home Equipment: None          Prior Functioning/Environment Level of Independence: Independent        Comments: pt previously indpendent without AD, enjoys walking for exercise. driving independently        OT Problem List: Decreased strength;Decreased activity tolerance;Impaired balance (sitting and/or standing);Decreased knowledge of use of DME or AE;Decreased knowledge of precautions;Cardiopulmonary status limiting activity;Decreased cognition;Decreased safety awareness      OT Treatment/Interventions: Therapeutic exercise;Self-care/ADL training;Energy conservation;DME and/or AE instruction;Therapeutic  activities;Patient/family education;Balance training;Cognitive remediation/compensation    OT Goals(Current goals can be found in the care plan section) Acute Rehab OT Goals Patient Stated Goal: to get some water OT Goal Formulation: With patient Time For Goal Achievement: 01/19/20 Potential to Achieve Goals: Good  OT Frequency: Min 2X/week   Barriers to D/C:            Co-evaluation PT/OT/SLP Co-Evaluation/Treatment: Yes Reason for Co-Treatment: For patient/therapist safety;To address functional/ADL transfers PT goals addressed during session: Mobility/safety with mobility;Balance;Proper use of DME OT goals addressed during session: ADL's and self-care      AM-PAC OT "6 Clicks" Daily Activity     Outcome Measure Help from another person eating meals?: Total(NPO) Help from another person taking care of personal grooming?: A Little Help from another person toileting, which includes using toliet, bedpan, or urinal?: A Lot Help from another person bathing (including washing, rinsing, drying)?: A Lot Help from another person to put on and taking off regular upper body clothing?: A Little Help from another person to put on and taking off regular lower body clothing?: A Lot 6 Click Score: 13   End of Session Equipment Utilized During Treatment: Gait belt;Oxygen Nurse Communication: Mobility status  Activity Tolerance: Patient tolerated treatment well Patient left: in chair;with call bell/phone within reach;with chair alarm set  OT Visit Diagnosis: Muscle weakness (generalized) (M62.81);Other abnormalities of gait and mobility (R26.89);Other symptoms and signs involving cognitive function                Time: 1411-1439 OT Time Calculation (min): 28  min Charges:  OT General Charges $OT Visit: 1 Visit OT Evaluation $OT Eval Moderate Complexity: [place], OT [employer] Pager [phone] Office [phone]   Raymondo Band 01/05/2020, 4:02 PM

## 2020-01-05 NOTE — Progress Notes (Signed)
Physical Therapy Treatment Patient Details Name: David Perez MRN: [id number] DOB: 21-Dec-1940 Today's Date: 01/05/2020    History of Present Illness Pt is an 79 y.o. 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 Extubated and IABP removed on 1/29. CXR with improved pulmonary edema. PMH includes Hep B.    PT Comments    Patient progressing well towards PT goals. Tolerated gait training today with Min A of 2 for balance, safety, lines and EVA walker management. 2/4 DOE, VSS throughout on 2L/min 02 [place]. Requires cues to adhere to sternal precautions during functional mobility. Requires Min A for standing and transfers. Pt is motivated to improve functional mobility and return to PLOF. Will follow.    Follow Up Recommendations  CIR;Supervision/Assistance - 24 hour     Equipment Recommendations  Rolling walker with 5" wheels;3in1 (PT)    Recommendations for Other Services       Precautions / Restrictions Precautions Precautions: Sternal;Fall Precaution Booklet Issued: No Precaution Comments: Reviewed sternal precautions Restrictions Weight Bearing Restrictions: Yes Other Position/Activity Restrictions: sternal precautions    Mobility  Bed Mobility Overal bed mobility: Needs Assistance Bed Mobility: Supine to Sit     Supine to sit: Min assist;HOB elevated     General bed mobility comments: Able to bring LEs to EOB, assist needed with trunk. Cues to not use UEs for mobility.  Transfers Overall transfer level: Needs assistance Equipment used: Rolling walker (2 wheeled) Transfers: Sit to/from [facility] Sit to Stand: Min assist;+2 safety/equipment Stand pivot transfers: Min assist;+2 safety/equipment       General transfer comment: ASsist to power to standing with cues for technique and hands on lap with use of momentum, stood from EOB x1, SPT bed to [facility], stood from [employer], transferred to chair post  ambulation.  Ambulation/Gait Ambulation/Gait assistance: Min assist;+2 safety/equipment Gait Distance (Feet): 100 Feet Assistive device: (EVa walker) Gait Pattern/deviations: Step-through pattern;Step-to pattern;Shuffle Gait velocity: decreased Gait velocity interpretation: <1.31 ft/sec, indicative of household ambulator General Gait Details: Slow, short steps with assist for EVA walker management and for balance. 2/4 DOE. VSS on 2L/min 02 [place].   Stairs             Wheelchair Mobility    Modified Rankin (Stroke Patients Only)       Balance Overall balance assessment: Needs assistance Sitting-balance support: Feet supported;No upper extremity supported Sitting balance-Leahy Scale: Fair Sitting balance - Comments: supervision   Standing balance support: During functional activity Standing balance-Leahy Scale: Poor Standing balance comment: reliant on UE support and Min A at times                            Cognition Arousal/Alertness: Awake/alert Behavior During Therapy: WFL for tasks assessed/performed Overall Cognitive Status: No family/caregiver present to determine baseline cognitive functioning Area of Impairment: Memory                     Memory: Decreased recall of precautions         General Comments: Pt motivated and cooperative. Shocked that it was Tuesday. Knew it was february. Irritated that he cannot eat or drink. Needs cues to adhere to precautions during mobility.      Exercises      General Comments General comments (skin integrity, edema, etc.): VSS throughout on 2L/min 02.      Pertinent Vitals/Pain Pain Assessment: No/denies  pain    Home Living                      Prior Function            PT Goals (current goals can now be found in the care plan section) Progress towards PT goals: Progressing toward goals    Frequency    Min 3X/week      PT Plan Current plan remains appropriate     Co-evaluation PT/OT/SLP Co-Evaluation/Treatment: Yes Reason for Co-Treatment: To address functional/ADL transfers PT goals addressed during session: Mobility/safety with mobility;Balance;Proper use of DME        AM-PAC PT "6 Clicks" Mobility   Outcome Measure  Help needed turning from your back to your side while in a flat bed without using bedrails?: A Little Help needed moving from lying on your back to sitting on the side of a flat bed without using bedrails?: A Little Help needed moving to and from a bed to a chair (including a wheelchair)?: A Little Help needed standing up from a chair using your arms (e.g., wheelchair or bedside chair)?: A Little Help needed to walk in hospital room?: A Little Help needed climbing 3-5 steps with a railing? : A Lot 6 Click Score: 17    End of Session Equipment Utilized During Treatment: Oxygen;Gait belt Activity Tolerance: Patient tolerated treatment well Patient left: in chair;with call bell/phone within reach;with chair alarm set Nurse Communication: Mobility status PT Visit Diagnosis: Difficulty in walking, not elsewhere classified (R26.2);Muscle weakness (generalized) (M62.81)     Time: [id number] PT Time Calculation (min) (ACUTE ONLY): 28 min  Charges:  $Gait Training: 8-22 mins                     Marisa Severin, PT, DPT Acute Rehabilitation Services Pager [phone] Office [place] 01/05/2020, 2:53 PM

## 2020-01-06 ENCOUNTER — Inpatient Hospital Stay (HOSPITAL_COMMUNITY): Payer: Medicare Other

## 2020-01-06 DIAGNOSIS — I5043 Acute on chronic combined systolic (congestive) and diastolic (congestive) heart failure: Secondary | ICD-10-CM

## 2020-01-06 DIAGNOSIS — I313 Pericardial effusion (noninflammatory): Secondary | ICD-10-CM

## 2020-01-06 LAB — GLUCOSE, CAPILLARY
Glucose-Capillary: 104 mg/dL — ABNORMAL HIGH (ref 70–99)
Glucose-Capillary: 117 mg/dL — ABNORMAL HIGH (ref 70–99)
Glucose-Capillary: 127 mg/dL — ABNORMAL HIGH (ref 70–99)
Glucose-Capillary: 133 mg/dL — ABNORMAL HIGH (ref 70–99)
Glucose-Capillary: 154 mg/dL — ABNORMAL HIGH (ref 70–99)
Glucose-Capillary: 158 mg/dL — ABNORMAL HIGH (ref 70–99)

## 2020-01-06 LAB — COMPREHENSIVE METABOLIC PANEL
ALT: 620 U/L — ABNORMAL HIGH (ref 0–44)
AST: 225 U/L — ABNORMAL HIGH (ref 15–41)
Albumin: 2.9 g/dL — ABNORMAL LOW (ref 3.5–5.0)
Alkaline Phosphatase: 68 U/L (ref 38–126)
Anion gap: 11 (ref 5–15)
BUN: 36 mg/dL — ABNORMAL HIGH (ref 8–23)
CO2: 29 mmol/L (ref 22–32)
Calcium: 8.3 mg/dL — ABNORMAL LOW (ref 8.9–10.3)
Chloride: 110 mmol/L (ref 98–111)
Creatinine, Ser: 1.27 mg/dL — ABNORMAL HIGH (ref 0.61–1.24)
GFR calc Af Amer: >60 mL/min (ref >60)
GFR calc non Af Amer: 53 mL/min — ABNORMAL LOW (ref >60)
Glucose, Bld: 182 mg/dL — ABNORMAL HIGH (ref 70–99)
Potassium: 3.7 mmol/L (ref 3.5–5.1)
Sodium: 150 mmol/L — ABNORMAL HIGH (ref 135–145)
Total Bilirubin: 3.8 mg/dL — ABNORMAL HIGH (ref 0.3–1.2)
Total Protein: 5.3 g/dL — ABNORMAL LOW (ref 6.5–8.1)

## 2020-01-06 LAB — CBC WITH DIFFERENTIAL/PLATELET
Abs Immature Granulocytes: 0.51 10*3/uL — ABNORMAL HIGH (ref 0.00–0.07)
Basophils Absolute: 0.1 10*3/uL (ref 0.0–0.1)
Basophils Relative: 0 %
Eosinophils Absolute: 0.1 10*3/uL (ref 0.0–0.5)
Eosinophils Relative: 0 %
HCT: 38.8 % — ABNORMAL LOW (ref 39.0–52.0)
Hemoglobin: 12.5 g/dL — ABNORMAL LOW (ref 13.0–17.0)
Immature Granulocytes: 3 %
Lymphocytes Relative: 6 %
Lymphs Abs: 1 10*3/uL (ref 0.7–4.0)
MCH: 32.6 pg (ref 26.0–34.0)
MCHC: 32.2 g/dL (ref 30.0–36.0)
MCV: 101.3 fL — ABNORMAL HIGH (ref 80.0–100.0)
Monocytes Absolute: 0.8 10*3/uL (ref 0.1–1.0)
Monocytes Relative: 5 %
Neutro Abs: 13.9 10*3/uL — ABNORMAL HIGH (ref 1.7–7.7)
Neutrophils Relative %: 86 %
Platelets: 126 10*3/uL — ABNORMAL LOW (ref 150–400)
RBC: 3.83 MIL/uL — ABNORMAL LOW (ref 4.22–5.81)
RDW: 18.8 % — ABNORMAL HIGH (ref 11.5–15.5)
WBC: 16.3 10*3/uL — ABNORMAL HIGH (ref 4.0–10.5)
nRBC: 2.1 % — ABNORMAL HIGH (ref 0.0–0.2)

## 2020-01-06 LAB — COOXEMETRY PANEL
Carboxyhemoglobin: 1.8 % — ABNORMAL HIGH (ref 0.5–1.5)
Carboxyhemoglobin: 1.7 % — ABNORMAL HIGH (ref 0.5–1.5)
Methemoglobin: 1.2 % (ref 0.0–1.5)
Methemoglobin: 0.7 % (ref 0.0–1.5)
O2 Saturation: 52.7 %
O2 Saturation: 58.4 %
Total hemoglobin: 12.8 g/dL (ref 12.0–16.0)
Total hemoglobin: 12.5 g/dL (ref 12.0–16.0)

## 2020-01-06 LAB — C-REACTIVE PROTEIN: CRP: 5.7 mg/dL — ABNORMAL HIGH (ref <1.0)

## 2020-01-06 LAB — SEDIMENTATION RATE: Sed Rate: 5 mm/hr (ref 0–16)

## 2020-01-06 LAB — ECHOCARDIOGRAM LIMITED
Height: 67 in
Weight: 2088.2 oz

## 2020-01-06 MED ORDER — DIGOXIN 125 MCG PO TABS
0.0625 mg | ORAL_TABLET | Freq: Every day | ORAL | Status: DC
  Administered 2020-01-06 – 2020-01-08 (×3): 0.0625 mg via ORAL
  Filled 2020-01-06 (×3): qty 1

## 2020-01-06 MED ORDER — POTASSIUM CHLORIDE 10 MEQ/50ML IV SOLN
10.0000 meq | INTRAVENOUS | Status: AC
  Administered 2020-01-06 (×3): 10 meq via INTRAVENOUS
  Filled 2020-01-06 (×3): qty 50

## 2020-01-06 MED ORDER — COLCHICINE 0.6 MG PO TABS
0.6000 mg | ORAL_TABLET | Freq: Every day | ORAL | Status: DC
  Administered 2020-01-06 – 2020-01-08 (×3): 0.6 mg via ORAL
  Filled 2020-01-06 (×4): qty 1

## 2020-01-06 MED ORDER — AMIODARONE HCL 200 MG PO TABS
200.0000 mg | ORAL_TABLET | Freq: Two times a day (BID) | ORAL | Status: DC
  Administered 2020-01-06 – 2020-01-08 (×5): 200 mg via ORAL
  Filled 2020-01-06 (×5): qty 1

## 2020-01-06 MED ORDER — ENSURE ENLIVE PO LIQD
237.0000 mL | Freq: Three times a day (TID) | ORAL | Status: DC
  Administered 2020-01-06 – 2020-01-08 (×4): 237 mL via ORAL

## 2020-01-06 MED ORDER — FREE WATER
200.0000 mL | Freq: Four times a day (QID) | Status: DC
  Administered 2020-01-06 (×2): 200 mL

## 2020-01-06 MED ORDER — SPIRONOLACTONE 12.5 MG HALF TABLET
12.5000 mg | ORAL_TABLET | Freq: Every day | ORAL | Status: DC
  Administered 2020-01-06: 12.5 mg
  Filled 2020-01-06: qty 1

## 2020-01-06 MED ORDER — OSMOLITE 1.2 CAL PO LIQD
1000.0000 mL | ORAL | Status: DC
  Filled 2020-01-06 (×2): qty 1000

## 2020-01-06 NOTE — Plan of Care (Signed)
  Problem: Education: Goal: Knowledge of General Education information will improve Description: Including pain rating scale, medication(s)/side effects and non-pharmacologic comfort measures Outcome: Progressing   Problem: Health Behavior/Discharge Planning: Goal: Ability to manage health-related needs will improve Outcome: Progressing   Problem: Clinical Measurements: Goal: Ability to maintain clinical measurements within normal limits will improve Outcome: Progressing   Problem: Clinical Measurements: Goal: Will remain free from infection Outcome: Progressing   Problem: Clinical Measurements: Goal: Diagnostic test results will improve Outcome: Progressing   Problem: Clinical Measurements: Goal: Respiratory complications will improve Outcome: Progressing   Problem: Activity: Goal: Risk for activity intolerance will decrease Outcome: Progressing

## 2020-01-06 NOTE — Progress Notes (Signed)
  Echocardiogram 2D Echocardiogram has been performed.  David Perez 01/06/2020, 9:44 AM

## 2020-01-06 NOTE — Progress Notes (Signed)
TCTS BRIEF SICU PROGRESS NOTE  6 Days Post-Op  S/P Procedure(s) (LRB): CORONARY ARTERY BYPASS GRAFTING (CABG) TIMES THREE USING LEFT INTERNAL MAMMARY ARTERY AND LEFT GREATER SAPHENOUS LEG VEIN HARVESTED ENDOSCOPICALLY (N/A) AORTIC VALVE REPLACEMENT (AVR) (N/A) MAZE (N/A) TRANSESOPHAGEAL ECHOCARDIOGRAM (TEE) (N/A) INDOCYANINE GREEN FLUORESCENCE IMAGING (ICG) (N/A) Clipping Of Atrial Appendage (Left)   Stable day Reportedly passed swallowing eval - starting oral diet NSR w/ stable BP Breathing comfortably on 2 L/min  Plan: Continue current plan  Rexene Alberts, MD 01/06/2020 6:06 PM

## 2020-01-06 NOTE — Progress Notes (Signed)
Patient tolerated ambulating   285ft.

## 2020-01-06 NOTE — Progress Notes (Signed)
6 Days Post-Op Procedure(s) (LRB): CORONARY ARTERY BYPASS GRAFTING (CABG) TIMES THREE USING LEFT INTERNAL MAMMARY ARTERY AND LEFT GREATER SAPHENOUS LEG VEIN HARVESTED ENDOSCOPICALLY (N/A) AORTIC VALVE REPLACEMENT (AVR) (N/A) MAZE (N/A) TRANSESOPHAGEAL ECHOCARDIOGRAM (TEE) (N/A) INDOCYANINE GREEN FLUORESCENCE IMAGING (ICG) (N/A) Clipping Of Atrial Appendage (Left) Subjective: Feeling better/stronger  Objective: Vital signs in last 24 hours: Temp:  [97.3 F (36.3 C)-98.3 F (36.8 C)] 98.3 F (36.8 C) (02/03 0837) Pulse Rate:  [63-92] 87 (02/03 0800) Cardiac Rhythm: Normal sinus rhythm (02/03 0800) Resp:  [8-30] 17 (02/03 0800) BP: (102-183)/(51-93) 153/90 (02/03 0800) SpO2:  [87 %-98 %] 89 % (02/03 0800) Weight:  [59.2 kg] 59.2 kg (02/03 0608)  Hemodynamic parameters for last 24 hours: CVP:  [3 mmHg] 3 mmHg  Intake/Output from previous day: 02/02 0701 - 02/03 0700 In: 1033.9 [I.V.:315.5; NG/GT:518.3; IV Piggyback:200] Out: 3180 [Urine:3100; Chest Tube:80] Intake/Output this shift: Total I/O In: 52.5 [I.V.:12.5; NG/GT:40] Out: 50 [Urine:50]  General appearance: alert and cooperative Neurologic: intact Heart: regular rate and rhythm, S1, S2 normal, no murmur, click, rub or gallop Lungs: clear to auscultation bilaterally Abdomen: soft, non-tender; bowel sounds normal; no masses,  no organomegaly Extremities: extremities normal, atraumatic, no cyanosis or edema and no edema, redness or tenderness in the calves or thighs Wound: c/d/i  Lab Results: Recent Labs    01/05/20 0320 01/06/20 0523  WBC 15.1* 16.3*  HGB 11.1* 12.5*  HCT 33.7* 38.8*  PLT 105* 126*   BMET:  Recent Labs    01/05/20 0320 01/06/20 0523  NA 147* 150*  K 3.7 3.7  CL 108 110  CO2 29 29  GLUCOSE 104* 182*  BUN 46* 36*  CREATININE 1.62* 1.27*  CALCIUM 8.0* 8.3*    PT/INR: No results for input(s): LABPROT, INR in the last 72 hours. ABG    Component Value Date/Time   PHART 7.509 (H)  01/01/2020 1514   HCO3 29.9 (H) 01/01/2020 1514   TCO2 31 01/01/2020 1514   ACIDBASEDEF 5.0 (H) 12/31/2019 2230   O2SAT 52.7 01/06/2020 0523   CBG (last 3)  Recent Labs    01/05/20 2344 01/06/20 0412 01/06/20 0834  GLUCAP 137* 117* 158*    Assessment/Plan: S/P Procedure(s) (LRB): CORONARY ARTERY BYPASS GRAFTING (CABG) TIMES THREE USING LEFT INTERNAL MAMMARY ARTERY AND LEFT GREATER SAPHENOUS LEG VEIN HARVESTED ENDOSCOPICALLY (N/A) AORTIC VALVE REPLACEMENT (AVR) (N/A) MAZE (N/A) TRANSESOPHAGEAL ECHOCARDIOGRAM (TEE) (N/A) INDOCYANINE GREEN FLUORESCENCE IMAGING (ICG) (N/A) Clipping Of Atrial Appendage (Left) Mobilize Diuresis repeat swallow testing; continue supplemental feeding  CIR consult   LOS: 6 days    Wonda Olds 01/06/2020

## 2020-01-06 NOTE — Progress Notes (Addendum)
Patient ID: Sirr Kabel, male   DOB: [birth year], 79 y.o.   MRN: [id number]     Advanced Heart Failure Rounding Note  PCP-Cardiologist: No primary care provider on file.   Subjective:    1/28: CABG (LIMA-LAD, SVG-LPDA, SVG-OM1), Maze + LA appendage clip, bioprosthetic AVR.  1/29: Extubated, IABP removed  Milrinone 0.125 mcg. CO-OX 53%    Hgb 10 => 8.4 => 10.8 => 11.=>12.5   Echo (pre-op): EF 35%  severe AS  Yesterday milrinone was cut back to 0.125 mcg. CO-OX 53%. Also diuresed with 40 mg IV lasix x1. Brisk diuresis. Weight down 7 pounds.   Feeling better today. Wants to eat.   Objective:   Weight Range: 59.2 kg Body mass index is 20.44 kg/m.   Vital Signs:   Temp:  [97.3 F (36.3 C)-98.5 F (36.9 C)] 97.8 F (36.6 C) (02/03 0400) Pulse Rate:  [63-91] 68 (02/03 0608) Resp:  [8-30] 23 (02/03 0608) BP: (102-172)/(51-94) 134/65 (02/03 0500) SpO2:  [89 %-98 %] 92 % (02/03 0714) Weight:  [59.2 kg] 59.2 kg (02/03 0608) Last BM Date: 01/05/20  Weight change: Filed Weights   01/04/20 0451 01/05/20 0500 01/06/20 0608  Weight: 67 kg 62.3 kg 59.2 kg    Intake/Output:   Intake/Output Summary (Last 24 hours) at 01/06/2020 0717 Last data filed at 01/06/2020 0600 Gross per 24 hour  Intake 899.15 ml  Output 3180 ml  Net -2280.85 ml      Physical Exam   CVp 3-4  General:   No resp difficulty HEENT: normal Neck: supple. no JVD. Carotids 2+ bilat; no bruits. No lymphadenopathy or thryomegaly appreciated.  Cor: PMI nondisplaced. Regular rate & rhythm. No rubs, gallops or murmurs. Sternal incision approximated.  Lungs: clear on 2 liters [place]. Abdomen: soft, nontender, nondistended. No hepatosplenomegaly. No bruits or masses. Good bowel sounds. Extremities: no cyanosis, clubbing, rash, edema Neuro: alert & orientedx3, cranial nerves grossly intact. moves all 4 extremities w/o difficulty. Affect pleasant   Telemetry   NSR with PACs 70-80s   Labs    CBC Recent Labs   01/05/20 0320 01/06/20 0523  WBC 15.1* 16.3*  NEUTROABS  --  13.9*  HGB 11.1* 12.5*  HCT 33.7* 38.8*  MCV 100.6* 101.3*  PLT 105* 355*   Basic Metabolic Panel Recent Labs    01/05/20 0320 01/06/20 0523  NA 147* 150*  K 3.7 3.7  CL 108 110  CO2 29 29  GLUCOSE 104* 182*  BUN 46* 36*  CREATININE 1.62* 1.27*  CALCIUM 8.0* 8.3*   Liver Function Tests Recent Labs    01/05/20 0320 01/06/20 0523  AST 376* 225*  ALT 826* 620*  ALKPHOS 66 68  BILITOT 3.0* 3.8*  PROT 5.3* 5.3*  ALBUMIN 3.0* 2.9*   No results for input(s): LIPASE, AMYLASE in the last 72 hours. Cardiac Enzymes No results for input(s): CKTOTAL, CKMB, CKMBINDEX, TROPONINI in the last 72 hours.  BNP: BNP (last 3 results) No results for input(s): BNP in the last 8760 hours.  ProBNP (last 3 results) No results for input(s): PROBNP in the last 8760 hours.   D-Dimer No results for input(s): DDIMER in the last 72 hours. Hemoglobin A1C No results for input(s): HGBA1C in the last 72 hours. Fasting Lipid Panel No results for input(s): CHOL, HDL, LDLCALC, TRIG, CHOLHDL, LDLDIRECT in the last 72 hours. Thyroid Function Tests No results for input(s): TSH, T4TOTAL, T3FREE, THYROIDAB in the last 72 hours.  Invalid input(s): FREET3  Other results:   Imaging  US Abdomen Limited RUQ  Result Date: 01/05/2020 CLINICAL DATA:  Elevated liver function tests EXAM: ULTRASOUND ABDOMEN LIMITED RIGHT UPPER QUADRANT COMPARISON:  None. FINDINGS: Gallbladder: No gallstones or wall thickening visualized. No sonographic Murphy sign noted by sonographer. Common bile duct: Diameter: 3 mm Liver: Evaluation of the left lobe liver is limited due to bowel gas. Visualized portions of the right lobe liver demonstrates grossly normal echotexture without focal abnormality. Portal vein is patent on color Doppler imaging with normal direction of blood flow towards the liver. Other: Visualized portions of the right kidney are unremarkable.  There is trace free fluid in the right upper quadrant. A small right pleural effusion is incidentally noted. IMPRESSION: 1. Limited study due to bowel gas. 2. Trace ascites and small right pleural effusion. 3. Otherwise unremarkable exam. Electronically Signed   By: Randa Ngo M.D.   On: 01/05/2020 11:54     Medications:     Scheduled Medications: . sodium chloride   Intravenous Once  . sodium chloride   Intravenous Once  . sodium chloride   Intravenous Once  . aspirin  325 mg Oral Daily  . atorvastatin  80 mg Oral q1800  . bisacodyl  10 mg Oral Daily   Or  . bisacodyl  10 mg Rectal Daily  . chlorhexidine gluconate (MEDLINE KIT)  15 mL Mouth Rinse BID  . Chlorhexidine Gluconate Cloth  6 each Topical Daily  . docusate sodium  200 mg Oral Daily  . enoxaparin (LOVENOX) injection  40 mg Subcutaneous Q24H  . insulin aspart  0-24 Units Subcutaneous Q4H  . levalbuterol  0.63 mg Nebulization TID  . magic mouthwash  5 mL Oral TID  . pantoprazole (PROTONIX) IV  40 mg Intravenous Q24H  . sodium chloride flush  10-40 mL Intracatheter Q12H  . sodium chloride flush  3 mL Intravenous Q12H    Infusions: . ceFEPime (MAXIPIME) IV 2 g (01/05/20 2205)  . feeding supplement (OSMOLITE 1.2 CAL) 40 mL/hr at 01/06/20 0430  . lactated ringers    . lactated ringers    . lactated ringers Stopped (01/05/20 0451)  . milrinone 0.125 mcg/kg/min (01/05/20 2208)    PRN Medications: lactated ringers, metoprolol tartrate, ondansetron (ZOFRAN) IV, oxyCODONE, sodium chloride flush, sodium chloride flush   Assessment/Plan   1. CAD: STEMI with very short LM and 99% ostial LAD. PCI thought to risk compromise of ostial large LCx, so decision made to defer PCI and transfer to [facility] for CABG as patient was CP-free at that point. Now s/p CABG with LIMA-LAD, SVG-OM2, SVG-LPDA.   - Continue ASA.  - Continue atorvastatin 80 mg daily when he can take po.  2. Cardiogenic shock: Pre-op echo with EF 35%.  Now s/p  CABG-AVR.   - CO-OX  53% . Continue milrinone to 0.125 today, repeat co-ox around noon.  May be able to stop later today.  - CVP 3-4. Euvolemic.  - Add 12.5 mg spironolactone.  - Renal function stable. 3. Atrial fibrillation: S/p Maze, LA appendage clip.  NSR today.  - Amiodarone gtt is off, continue amiodarone 200 mg bid (when he can take pills)   - Will need eventual anticoagulation, use DVT prophylaxis Lovenox for now.  4. Hyperlipidemia: atorvastatin when taking pills.   5. Thrombocytopenia: Post-op, trending up.  6. Acute hypoxemic respiratory failure: Pulmonary edema. Now on 2L [place].  - Improved.  7. AKI: Creatinine trending down 1.3  8. Neuro: Agitated delirium with paranoia.  Daughter is able to re-orient. Improved today.  9. ID: WBCs 25 => 19 => 16.6 => 15=>16.3 , afebrile.  On cefepime.  - Cultures NGTD  10. Anemia: Post-op, got 1 unit PRBCs 1/31. Hgb remains stable.  11. Dysphagia: Failed swallow again yesterday, reassess today.  Will need Cortrak if fails.  12. Elevated LFTs: ?Shock liver from hypotensive episode immediately post-op.  He has not had statin or amiodarone as he cannot swallow. LFTs now trending down.  - RUQ US unremarkable.   20. Mobilize, up to chair when he is awake.  Work with PT. CIR consulted.  14. Hypernatremia - Sodium 150, start free water.   Length of Stay: 6  Amy Clegg, NP  01/06/2020, 7:17 AM  Advanced Heart Failure Team Pager [phone] (M-F; [place])  Please contact [facility] for night-coverage after hours (4p -7a ) and weekends on [email or web address]  Patient seen with NP, agree with the above note.   Some pain in chest, pleuritic component, but not severe.  Walked yesterday.  Milrinone down to 0.125, co-ox 53% today with low CVP.  More alert.  Failed swallow study again, has Cortrak.   General: NAD Neck: No JVD, no thyromegaly or thyroid nodule.  Lungs: Clear to auscultation bilaterally with normal respiratory effort. CV: Nondisplaced PMI.   Heart regular S1/S2, no S3/S4, pericardial friction rub noted.  No peripheral edema.   Abdomen: Soft, nontender, no hepatosplenomegaly, no distention.  Skin: Intact without lesions or rashes.  Neurologic: Alert and oriented x 3.  Psych: Normal affect. Extremities: No clubbing or cyanosis.  HEENT: Normal.   Mild pleuritic chest pain with pericardial friction rub, did not hear yesterday.  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 WBCs remain mildly elevated.  - Send ESR/CRP.  - Check echo for pericardial effusion and ECG.  - Will give colchicine 0.6 mg daily for now.   Check co-ox later this morning, stop milrinone if adequate.  Creatinine lower at 1.27 today.  - Add spironolactone 12.5 daily.  - Add low dose digoxin 0.0625 daily.  - Can hold Lasix today with low CVP.   Continue amiodarone po given pre-op atrial fibrillation, currently in NSR.  Eventually should likely start anticoagulation but with possible pericardial inflammation will continue DVT prophylaxis.   Getting tube feeds as failed swallow.  Recheck soon.  With hypernatremia, needs free water boluses (started 200 cc every 6 hrs).   Continue to mobilize.   Loralie Champagne 01/06/2020 8:09 AM

## 2020-01-06 NOTE — Progress Notes (Signed)
Physical Therapy Treatment Patient Details Name: Masato Zunk MRN: [id number] DOB: 11-Dec-1940 Today's Date: 01/06/2020    History of Present Illness Pt is an 79 y.o. male admitted 12/31/19 with chest pain, s/p emergent cath and transfer to [facility], s/p CABG and AVR on 1/28. Extubated and IABP removed on 1/29. CXR with improved pulmonary edema. PMH includes Hep B.    PT Comments    Pt tolerated treatment well, increasing ambulation tolerance and improving transfer technique with PT education. Pt continues to requires some physical assistance to perform all mobility due to strength and power deficits, and requires verbal cueing to maintain sternal precautions. Pt will continue to benefit from acute PT POC to restore independence.   Follow Up Recommendations  CIR;Supervision/Assistance - 24 hour     Equipment Recommendations  Rolling walker with 5" wheels;3in1 (PT)    Recommendations for Other Services       Precautions / Restrictions Precautions Precautions: Sternal;Fall Precaution Booklet Issued: No Precaution Comments: Reviewed sternal precautions Restrictions Weight Bearing Restrictions: No Other Position/Activity Restrictions: sternal precautions    Mobility  Bed Mobility Overal bed mobility: Needs Assistance Bed Mobility: Rolling;Sidelying to Sit Rolling: Supervision Sidelying to sit: Min assist          Transfers Overall transfer level: Needs assistance Equipment used: (EVA walker) Transfers: Sit to/from Stand Sit to Stand: Min assist         General transfer comment: pt requires PT cueing for technique including scooting toward edge of surface, forward lean, hand placement, and rocking for momentum  Ambulation/Gait Ambulation/Gait assistance: Min guard Gait Distance (Feet): 200 Feet Assistive device: (EVA walker) Gait Pattern/deviations: Step-through pattern Gait velocity: reduced Gait velocity interpretation: <1.8 ft/sec, indicate of risk for recurrent  falls General Gait Details: pt with reduced gait speed, slight forward trunk lean to lean on EVA walker for support   Stairs             Wheelchair Mobility    Modified Rankin (Stroke Patients Only)       Balance Overall balance assessment: Needs assistance Sitting-balance support: No upper extremity supported;Feet supported Sitting balance-Leahy Scale: Good Sitting balance - Comments: supervision   Standing balance support: Bilateral upper extremity supported Standing balance-Leahy Scale: Fair Standing balance comment: minG with BUE support of EVA walker                            Cognition Arousal/Alertness: Awake/alert Behavior During Therapy: WFL for tasks assessed/performed;Impulsive Overall Cognitive Status: Within Functional Limits for tasks assessed                                        Exercises      General Comments General comments (skin integrity, edema, etc.): VSS on 2L [place]      Pertinent Vitals/Pain Pain Assessment: No/denies pain    Home Living                      Prior Function            PT Goals (current goals can now be found in the care plan section) Acute Rehab PT Goals Patient Stated Goal: to return to independent mobility Progress towards PT goals: Progressing toward goals    Frequency    Min 3X/week      PT Plan Current plan remains appropriate  Co-evaluation              AM-PAC PT "6 Clicks" Mobility   Outcome Measure  Help needed turning from your back to your side while in a flat bed without using bedrails?: A Little Help needed moving from lying on your back to sitting on the side of a flat bed without using bedrails?: A Little Help needed moving to and from a bed to a chair (including a wheelchair)?: A Little Help needed standing up from a chair using your arms (e.g., wheelchair or bedside chair)?: A Little Help needed to walk in hospital room?: A Little Help needed  climbing 3-5 steps with a railing? : A Lot 6 Click Score: 17    End of Session Equipment Utilized During Treatment: Oxygen Activity Tolerance: Patient tolerated treatment well Patient left: in chair;with call bell/phone within reach;with family/visitor present;with chair alarm set Nurse Communication: Mobility status PT Visit Diagnosis: Difficulty in walking, not elsewhere classified (R26.2);Muscle weakness (generalized) (M62.81)     Time: [id number] PT Time Calculation (min) (ACUTE ONLY): 33 min  Charges:  $Gait Training: 8-22 mins $Therapeutic Activity: 8-22 mins                     Zenaida Niece, PT, DPT Acute Rehabilitation Pager: [phone]    Zenaida Niece 01/06/2020, 12:54 PM

## 2020-01-06 NOTE — Progress Notes (Signed)
  Speech Language Pathology Treatment: Dysphagia  Patient Details Name: David Perez MRN: [id number] DOB: [birth year] Today's Date: 01/06/2020 Time: 94-1440 SLP Time Calculation (min) (ACUTE ONLY): 20 min  Assessment / Plan / Recommendation Clinical Impression  F/u for dysphagia, PO readiness. Pt has made excellent progress with resolution of dysphagia.  Demonstrated active mastication of regular solids, brisk swallow response, no s/s of aspiration with thin liquids by themselves nor when consumed simultaneously with solids.  Pt very excited.  Recommend regular solids, thin liquids.  No SLP f/u is needed.  Cortrak may be pulled, particularly given pt's motivation to eat.    HPI HPI: Pt is an 79 y.o. with minimal past history who was transferred by Dr. Clayborn Bigness from [facility] tonight for evaluation for CABG.  He has done well in general, remains very active, lives alone in [place], and has been taking no medications.  No history of cardiac problems. Pt admitted for STEMI following pressure in chest 1/27, now s/p CABG. Intubated approx 26 hours for procedure and following.  CXR 1/31 showing B opacities consistent with pulmonary edema.      SLP Plan  All goals met       Recommendations  Diet recommendations: Regular;Thin liquid Liquids provided via: Cup;Straw Medication Administration: Whole meds with liquid Supervision: Patient able to self feed                Follow up Recommendations: None SLP Visit Diagnosis: Dysphagia, oropharyngeal phase (R13.12) Plan: All goals met       GO                Juan Quam Laurice 01/06/2020, 2:40 PM  Tamanika Heiney L. Tivis Ringer, [place] Office number [phone]

## 2020-01-06 NOTE — H&P (Addendum)
Physical Medicine and Rehabilitation Admission H&P    CC: Cardiac Debility   HPI:  David Perez is an 79 year old male in relatively good health who was originally admitted via [facility] on 12/30/2018 with sudden onset of chest pain due to STEMI.  He underwent cardiac cath emergently revealing aortic stenosis and ostial LAD lesion not felt to be optimal for PCI therefore IAPB placed and he was transferred to [facility] for management.  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 He was evaluated by Dr. Ahmed Prima and taken to the OR emergently for CABG X 3 with AVR and clipping of atrial appendage on the same day.  Post extubation had hypoxia requiring BiPAP as well as hypotension and A. fib with RVR.  He required Precedex due to delirium with agitation.  Acute hypoxic respiratory failure felt to be due to pulmonary edema and treated with diuresis--continues to have intermittent tachypnea and requires 2L oxygen per [facility].  Acute blood loss anemia with thrombocytopenia and leukocytosis of 25,000 as well as abnormal LFTs was monitored with serial checks. Urine culture/blood cultures have been negative.    Mentation has fluctuated due to ongoing delirium and he was kept n.p.o with tube feeds till mentation improved.  Speech therapy following for input and diet advanced to regular.  He developed pleuritic chest pain and found to have a friction rub 2/3. 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 Heart failure following patient to assist with management of fluid overload--milrinone d/c 2/3 with addition of digoxin and amiodarone changed to po route. CHF team following to optimize medication--Losartan added 2/04  with increase in spironolactone to 25 mg  daily. Therapy ongoing and CIR recommended due to debility.    ROS +chest discomfort, denies pain, constipation. Slept well last night for the first time in a while. Denies SOB. Other ROS negative.    Past Medical History:  Diagnosis Date   Hepatitis B     Past Surgical History:  Procedure Laterality Date   AORTIC VALVE REPLACEMENT N/A 12/31/2019   Procedure: AORTIC VALVE REPLACEMENT (AVR);  Surgeon: Wonda Olds, MD;  Location: [place];  Service: Open Heart Surgery;  Laterality: N/A;   CLIPPING OF ATRIAL APPENDAGE Left 12/31/2019   Procedure: Clipping Of Atrial Appendage;  Surgeon: Wonda Olds, MD;  Location: [facility] OR;  Service: Open Heart Surgery;  Laterality: Left;   CORONARY ARTERY BYPASS GRAFT N/A 12/31/2019   Procedure: CORONARY ARTERY BYPASS GRAFTING (CABG) TIMES THREE USING LEFT INTERNAL MAMMARY ARTERY AND LEFT GREATER SAPHENOUS LEG VEIN HARVESTED ENDOSCOPICALLY;  Surgeon: Wonda Olds, MD;  Location: [place];  Service: Open Heart Surgery;  Laterality: N/A;  POSSIBLE BILATERAL IMA   CORONARY/GRAFT ACUTE MI REVASCULARIZATION N/A 12/31/2019   Procedure: Coronary/Graft Acute MI Revascularization;  Surgeon: Yolonda Kida, MD;  Location: [place] [facility];  Service: Cardiovascular;  Laterality: N/A;   LEFT HEART CATH AND CORONARY ANGIOGRAPHY N/A 12/31/2019   Procedure: LEFT HEART CATH AND CORONARY ANGIOGRAPHY;  Surgeon: Yolonda Kida, MD;  Location: [place] [facility];  Service: Cardiovascular;  Laterality: N/A;   MAZE N/A 12/31/2019   Procedure: MAZE;  Surgeon: Wonda Olds, MD;  Location: [place];  Service: Open Heart Surgery;  Laterality: N/A;   TEE WITHOUT  CARDIOVERSION N/A 12/31/2019   Procedure: TRANSESOPHAGEAL ECHOCARDIOGRAM (TEE);  Surgeon: Wonda Olds, MD;  Location: [place];  Service: Open Heart Surgery;  Laterality: N/A;    History reviewed. No pertinent family history.    Social History:  reports that he has never smoked. He has never  used smokeless tobacco. He reports previous alcohol use. He reports previous drug use.    Allergies: No Known Allergies    Medications Prior to Admission  Medication Sig Dispense Refill   OVER THE COUNTER MEDICATION Take 1 tablet by mouth daily. Progenix      Drug Regimen Review  Drug regimen was reviewed and remains appropriate with no significant issues identified  Home: Home Living Family/patient expects to be discharged to:: Private residence Living Arrangements: Alone Available Help at Discharge: (per PT eval, 2 dtrs live nearby & working on 24hr schedule) Type of Home: House Home Access: Level entry Home Layout: One level Bathroom Shower/Tub: Chiropodist: Standard Home Equipment: None   Functional History: Prior Function Level of Independence: Independent Comments: pt previously indpendent without AD, enjoys walking for exercise. driving independently  Functional Status:  Mobility: Bed Mobility Overal bed mobility: Needs Assistance Bed Mobility: Rolling, Sidelying to Sit Rolling: Supervision Sidelying to sit: Min assist Supine to sit: Min assist, HOB elevated Sit to supine: Mod assist General bed mobility comments: Up in chair upon PT arrival. Transfers Overall transfer level: Needs assistance Equipment used: Rolling walker (2 wheeled) Transfers: Sit to/from Stand Sit to Stand: Mod assist, Min assist Stand pivot transfers: Min assist, +2 safety/equipment General transfer comment: Cues for hand placement, use of momentum and assist to power up as pt able to get half way and then loses balance posteriorly. Stood from chair x5 with emphasize on eccentric controlled descent into chair and powering up with LEs. Ambulation/Gait Ambulation/Gait assistance: Min guard Gait Distance (Feet): 75 Feet Assistive device: Rolling walker (2 wheeled) Gait Pattern/deviations: Step-through pattern, Decreased stride length General Gait Details: Slow, mostly  steady gait wtih RW (adjusted height). 2-3/4 DOE. Reports BLE weakness and fatigue needing to sit. Sp02 remained >88% on RA, difficulty getting accurate pleth reading due to cold fingers. Gait velocity: reduced Gait velocity interpretation: <1.8 ft/sec, indicate of risk for recurrent falls    ADL: ADL Overall ADL's : Needs assistance/impaired Eating/Feeding: NPO Grooming: Wash/dry hands, Min guard, Standing, Wash/dry face Upper Body Bathing: Min guard, Sitting Lower Body Bathing: Maximal assistance, Sit to/from stand Upper Body Dressing : Minimal assistance, Sitting Lower Body Dressing: Maximal assistance, Sit to/from stand Toilet Transfer: Regular Toilet, Ambulation, RW, Cueing for safety, Cueing for sequencing, Moderate assistance Toilet Transfer Details (indicate cue type and reason): Min A to descend onto toilet, with cues required to adhere to precautions, due to lowered surface, pt required mod A to stand and further cues to not utilize grab bars Toileting- Water quality scientist and Hygiene: Supervision/safety, Cueing for sequencing, Cueing for safety Toileting - Clothing Manipulation Details (indicate cue type and reason): pt now able to complete pericare safely with sternal precautions Functional mobility during ADLs: Min guard, Cueing for safety, Cueing for sequencing, Rolling walker  Cognition: Cognition Overall Cognitive Status: Within Functional Limits for tasks assessed Orientation Level: Oriented X4 Cognition Arousal/Alertness: Awake/alert Behavior During Therapy: WFL for tasks assessed/performed Overall Cognitive Status: Within Functional Limits for tasks assessed Area of Impairment: Memory, Safety/judgement Current Attention Level: Selective Memory: Decreased recall of precautions Safety/Judgement: Decreased awareness of deficits, Decreased awareness of safety Awareness: Intellectual General Comments: Pt motivated and  cooperative. Shocked that it was Tuesday. Knew it  was february. Irritated that he cannot eat or drink. Needs cues to adhere to precautions during mobility.   Blood pressure 100/71, pulse 64, temperature 98.1 F (36.7 C), temperature source Oral, resp. rate 20, height '5\' 7"'  (1.702 m), weight 60 kg, SpO2 93 %.   Physical Exam  General: Alert and oriented x 3, No apparent distress HEENT: Head is normocephalic, atraumatic, PERRLA, EOMI, sclera anicteric, oral mucosa pink and moist, dentition intact, ext ear canals clear,  Neck: Supple without JVD or lymphadenopathy Heart: Reg rate and rhythm. No murmurs rubs or gallops Chest: CTA bilaterally without wheezes, rales, or rhonchi; no distress Abdomen: Soft, non-tender, non-distended, bowel sounds positive. Extremities: No clubbing, cyanosis, or edema. Pulses are 2+ Skin: Surgical incision on chest clean and intact without signs of breakdown. Some tenderness to palpation over chest wall around incision site.  Neuro: Pt is cognitively appropriate with normal insight, memory, and awareness. Cranial nerves 2-12 are intact. Sensory exam is normal. Reflexes are 2+ in all 4's. Fine motor coordination is intact. No tremors. Motor function is grossly 5/5.  Musculoskeletal: Full ROM, No pain with AROM or PROM in the neck, trunk, or extremities. Posture appropriate Psych: Pt's affect is appropriate. Pt is cooperative  Results for orders placed or performed during the hospital encounter of 12/31/19 (from the past 48 hour(s))  Glucose, capillary     Status: Abnormal   Collection Time: 01/06/20  3:55 PM  Result Value Ref Range   Glucose-Capillary 133 (H) 70 - 99 mg/dL  Glucose, capillary     Status: Abnormal   Collection Time: 01/06/20  8:05 PM  Result Value Ref Range   Glucose-Capillary 127 (H) 70 - 99 mg/dL  Glucose, capillary     Status: Abnormal   Collection Time: 01/06/20 11:56 PM  Result Value Ref Range   Glucose-Capillary 104 (H) 70 - 99 mg/dL  Glucose, capillary     Status: Abnormal   Collection  Time: 01/07/20  4:07 AM  Result Value Ref Range   Glucose-Capillary 106 (H) 70 - 99 mg/dL  Cooxemetry Panel (carboxy, met, total hgb, O2 sat)     Status: Abnormal   Collection Time: 01/07/20  5:03 AM  Result Value Ref Range   Total hemoglobin 12.2 12.0 - 16.0 g/dL   O2 Saturation 57.3 %   Carboxyhemoglobin 2.4 (H) 0.5 - 1.5 %   Methemoglobin 1.1 0.0 - 1.5 %    Comment: Performed at [facility], [place] [address], [place], [place] [address]  Comprehensive metabolic panel     Status: Abnormal   Collection Time: 01/07/20  5:03 AM  Result Value Ref Range   Sodium 148 (H) 135 - 145 mmol/L   Potassium 4.0 3.5 - 5.1 mmol/L   Chloride 113 (H) 98 - 111 mmol/L   CO2 27 22 - 32 mmol/L   Glucose, Bld 117 (H) 70 - 99 mg/dL   BUN 25 (H) 8 - 23 mg/dL   Creatinine, Ser 1.27 (H) 0.61 - 1.24 mg/dL   Calcium 8.3 (L) 8.9 - 10.3 mg/dL   Total Protein 5.2 (L) 6.5 - 8.1 g/dL   Albumin 2.6 (L) 3.5 - 5.0 g/dL   AST 102 (H) 15 - 41 U/L   ALT 445 (H) 0 - 44 U/L   Alkaline Phosphatase 62 38 - 126 U/L   Total Bilirubin 3.5 (H) 0.3 - 1.2 mg/dL   GFR calc non Af Amer 53 (L) >60 mL/min  GFR calc Af Amer >60 >60 mL/min   Anion gap 8 5 - 15    Comment: Performed at [facility] [address], [place], [place] [address]  CBC with Differential/Platelet     Status: Abnormal   Collection Time: 01/07/20  5:03 AM  Result Value Ref Range   WBC 16.2 (H) 4.0 - 10.5 K/uL   RBC 3.64 (L) 4.22 - 5.81 MIL/uL   Hemoglobin 11.9 (L) 13.0 - 17.0 g/dL   HCT 37.0 (L) 39.0 - 52.0 %   MCV 101.6 (H) 80.0 - 100.0 fL   MCH 32.7 26.0 - 34.0 pg   MCHC 32.2 30.0 - 36.0 g/dL   RDW 18.6 (H) 11.5 - 15.5 %   Platelets 129 (L) 150 - 400 K/uL   nRBC 0.8 (H) 0.0 - 0.2 %   Neutrophils Relative % 81 %   Neutro Abs 13.0 (H) 1.7 - 7.7 K/uL   Lymphocytes Relative 8 %   Lymphs Abs 1.3 0.7 - 4.0 K/uL   Monocytes Relative 6 %   Monocytes Absolute 1.0 0.1 - 1.0 K/uL   Eosinophils Relative 1 %   Eosinophils Absolute 0.2 0.0 - 0.5  K/uL   Basophils Relative 0 %   Basophils Absolute 0.1 0.0 - 0.1 K/uL   Immature Granulocytes 4 %   Abs Immature Granulocytes 0.61 (H) 0.00 - 0.07 K/uL    Comment: Performed at [facility] [address] [place], [place] [address]  Glucose, capillary     Status: Abnormal   Collection Time: 01/07/20  8:00 AM  Result Value Ref Range   Glucose-Capillary 147 (H) 70 - 99 mg/dL  Glucose, capillary     Status: Abnormal   Collection Time: 01/07/20 11:36 AM  Result Value Ref Range   Glucose-Capillary 100 (H) 70 - 99 mg/dL  Glucose, capillary     Status: Abnormal   Collection Time: 01/07/20  3:27 PM  Result Value Ref Range   Glucose-Capillary 148 (H) 70 - 99 mg/dL  Glucose, capillary     Status: Abnormal   Collection Time: 01/07/20  9:05 PM  Result Value Ref Range   Glucose-Capillary 108 (H) 70 - 99 mg/dL  Glucose, capillary     Status: Abnormal   Collection Time: 01/08/20 12:21 AM  Result Value Ref Range   Glucose-Capillary 138 (H) 70 - 99 mg/dL  CBC with Differential/Platelet     Status: Abnormal   Collection Time: 01/08/20  2:55 AM  Result Value Ref Range   WBC 16.5 (H) 4.0 - 10.5 K/uL   RBC 3.68 (L) 4.22 - 5.81 MIL/uL   Hemoglobin 12.1 (L) 13.0 - 17.0 g/dL   HCT 37.7 (L) 39.0 - 52.0 %   MCV 102.4 (H) 80.0 - 100.0 fL   MCH 32.9 26.0 - 34.0 pg   MCHC 32.1 30.0 - 36.0 g/dL   RDW 18.7 (H) 11.5 - 15.5 %   Platelets 156 150 - 400 K/uL   nRBC 0.4 (H) 0.0 - 0.2 %   Neutrophils Relative % 76 %   Neutro Abs 12.7 (H) 1.7 - 7.7 K/uL   Lymphocytes Relative 9 %   Lymphs Abs 1.4 0.7 - 4.0 K/uL   Monocytes Relative 7 %   Monocytes Absolute 1.1 (H) 0.1 - 1.0 K/uL   Eosinophils Relative 5 %   Eosinophils Absolute 0.7 (H) 0.0 - 0.5 K/uL   Basophils Relative 0 %   Basophils Absolute 0.1 0.0 - 0.1 K/uL   Immature  Granulocytes 3 %   Abs Immature Granulocytes 0.52 (H) 0.00 - 0.07 K/uL    Comment: Performed at [facility], [place] [address] [place], [place] [address]  Basic  metabolic panel     Status: Abnormal   Collection Time: 01/08/20  2:55 AM  Result Value Ref Range   Sodium 147 (H) 135 - 145 mmol/L   Potassium 4.4 3.5 - 5.1 mmol/L   Chloride 111 98 - 111 mmol/L   CO2 25 22 - 32 mmol/L   Glucose, Bld 129 (H) 70 - 99 mg/dL   BUN 27 (H) 8 - 23 mg/dL   Creatinine, Ser 1.20 0.61 - 1.24 mg/dL   Calcium 8.6 (L) 8.9 - 10.3 mg/dL   GFR calc non Af Amer 57 (L) >60 mL/min   GFR calc Af Amer >60 >60 mL/min   Anion gap 11 5 - 15    Comment: Performed at [facility] [address], [place],  [address]  Glucose, capillary     Status: Abnormal   Collection Time: 01/08/20  4:28 AM  Result Value Ref Range   Glucose-Capillary 113 (H) 70 - 99 mg/dL  Glucose, capillary     Status: Abnormal   Collection Time: 01/08/20  8:04 AM  Result Value Ref Range   Glucose-Capillary 158 (H) 70 - 99 mg/dL  Glucose, capillary     Status: Abnormal   Collection Time: 01/08/20 11:55 AM  Result Value Ref Range   Glucose-Capillary 108 (H) 70 - 99 mg/dL   DG Chest 2 View  Result Date: 01/07/2020 CLINICAL DATA:  Recent coronary artery bypass surgery, prior abnormal chest x-ray EXAM: CHEST - 2 VIEW COMPARISON:  01/05/2020 FINDINGS: Frontal and lateral views of the chest demonstrate interval removal of the left subclavian catheter and bilateral chest tubes. Epicardial pacing wires remain, a contraindication to MRI. Postsurgical changes from median sternotomy and aortic valve replacement. Cardiac silhouette is enlarged but stable. Overall, improved volume status with decreased pleural effusions, resolution of ground-glass airspace disease, and decreased central vascular congestion. No evidence pneumothorax. Minimal subcutaneous gas within the right lateral chest wall related to previous chest tube. IMPRESSION: 1. Interval removal of left subclavian catheter and bilateral chest tubes. No evidence of pneumothorax. 2. Improved volume status with decreased pleural effusions and decreased  central vascular congestion. Electronically Signed   By: Randa Ngo M.D.   On: 01/07/2020 10:38       Medical Problem List and Plan: 1.  Impaired mobility and ADLs secondary to cardiac debility s/p CABGx3.  -patient may shower  -ELOS/Goals: modI in PT, OT, I in SLP  -2 weeks colchicine for pericardial irritation 2.  Antithrombotics: -DVT/anticoagulation: Starting Eliquis today (2/5)   -antiplatelet therapy: On ASA 3. Pain Management: Denies pain, not receiving any pain medications.  4. Mood: LCSW to follow for evaluations and support.   -antipsychotic agents:  N/A 5. Neuropsych: This patient is capable of making decisions on his own behalf. 6. Skin/Wound Care: Monitor incisions for healing.  7. Fluids/Electrolytes/Nutrition: Offer supplements between meals to promote wound healing.  8. AFib: Continue to monitor HR tid and with increase in activity. Continue Amiodarone bid 9. Persistent Leucocytosis: WBC up to 16,000 +--infectious work up negative s/p IV Cefepime.  10 Acute renal failure with hypernatremia: Need to encourage fluid intake.  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 On ASA, Losartan, digoxin, coreg, Lipitor. 12. Thrombocytopenia: Has resolved.  13. Shortness of breath:  Encourage IS. Continue  14. Constipation: Resolved. Can DC daily docusate, miralax, and dulcolax. Patient is refusing and moving bowels regularly.  15. Disposition: Lives alone, but has supportive family, including daughter, nearby.   Reesa Chew, PA-C  I have personally performed a face to face diagnostic evaluation, including, but not limited to relevant history and physical exam findings, of this patient and developed relevant assessment and plan.  Additionally, I have reviewed and concur with the physician assistant's documentation above.  Izora Ribas, MD 01/08/2020

## 2020-01-07 ENCOUNTER — Inpatient Hospital Stay (HOSPITAL_COMMUNITY): Payer: Medicare Other

## 2020-01-07 LAB — CBC WITH DIFFERENTIAL/PLATELET
Abs Immature Granulocytes: 0.61 10*3/uL — ABNORMAL HIGH (ref 0.00–0.07)
Basophils Absolute: 0.1 10*3/uL (ref 0.0–0.1)
Basophils Relative: 0 %
Eosinophils Absolute: 0.2 10*3/uL (ref 0.0–0.5)
Eosinophils Relative: 1 %
HCT: 37 % — ABNORMAL LOW (ref 39.0–52.0)
Hemoglobin: 11.9 g/dL — ABNORMAL LOW (ref 13.0–17.0)
Immature Granulocytes: 4 %
Lymphocytes Relative: 8 %
Lymphs Abs: 1.3 10*3/uL (ref 0.7–4.0)
MCH: 32.7 pg (ref 26.0–34.0)
MCHC: 32.2 g/dL (ref 30.0–36.0)
MCV: 101.6 fL — ABNORMAL HIGH (ref 80.0–100.0)
Monocytes Absolute: 1 10*3/uL (ref 0.1–1.0)
Monocytes Relative: 6 %
Neutro Abs: 13 10*3/uL — ABNORMAL HIGH (ref 1.7–7.7)
Neutrophils Relative %: 81 %
Platelets: 129 10*3/uL — ABNORMAL LOW (ref 150–400)
RBC: 3.64 MIL/uL — ABNORMAL LOW (ref 4.22–5.81)
RDW: 18.6 % — ABNORMAL HIGH (ref 11.5–15.5)
WBC: 16.2 10*3/uL — ABNORMAL HIGH (ref 4.0–10.5)
nRBC: 0.8 % — ABNORMAL HIGH (ref 0.0–0.2)

## 2020-01-07 LAB — COOXEMETRY PANEL
Carboxyhemoglobin: 2.4 % — ABNORMAL HIGH (ref 0.5–1.5)
Methemoglobin: 1.1 % (ref 0.0–1.5)
O2 Saturation: 57.3 %
Total hemoglobin: 12.2 g/dL (ref 12.0–16.0)

## 2020-01-07 LAB — COMPREHENSIVE METABOLIC PANEL
ALT: 445 U/L — ABNORMAL HIGH (ref 0–44)
AST: 102 U/L — ABNORMAL HIGH (ref 15–41)
Albumin: 2.6 g/dL — ABNORMAL LOW (ref 3.5–5.0)
Alkaline Phosphatase: 62 U/L (ref 38–126)
Anion gap: 8 (ref 5–15)
BUN: 25 mg/dL — ABNORMAL HIGH (ref 8–23)
CO2: 27 mmol/L (ref 22–32)
Calcium: 8.3 mg/dL — ABNORMAL LOW (ref 8.9–10.3)
Chloride: 113 mmol/L — ABNORMAL HIGH (ref 98–111)
Creatinine, Ser: 1.27 mg/dL — ABNORMAL HIGH (ref 0.61–1.24)
GFR calc Af Amer: >60 mL/min (ref >60)
GFR calc non Af Amer: 53 mL/min — ABNORMAL LOW (ref >60)
Glucose, Bld: 117 mg/dL — ABNORMAL HIGH (ref 70–99)
Potassium: 4 mmol/L (ref 3.5–5.1)
Sodium: 148 mmol/L — ABNORMAL HIGH (ref 135–145)
Total Bilirubin: 3.5 mg/dL — ABNORMAL HIGH (ref 0.3–1.2)
Total Protein: 5.2 g/dL — ABNORMAL LOW (ref 6.5–8.1)

## 2020-01-07 LAB — GLUCOSE, CAPILLARY
Glucose-Capillary: 106 mg/dL — ABNORMAL HIGH (ref 70–99)
Glucose-Capillary: 147 mg/dL — ABNORMAL HIGH (ref 70–99)
Glucose-Capillary: 100 mg/dL — ABNORMAL HIGH (ref 70–99)
Glucose-Capillary: 148 mg/dL — ABNORMAL HIGH (ref 70–99)
Glucose-Capillary: 108 mg/dL — ABNORMAL HIGH (ref 70–99)

## 2020-01-07 LAB — CULTURE, BLOOD (ROUTINE X 2)
Culture: NO GROWTH
Culture: NO GROWTH

## 2020-01-07 MED ORDER — CARVEDILOL 3.125 MG PO TABS
3.1250 mg | ORAL_TABLET | Freq: Two times a day (BID) | ORAL | Status: DC
  Administered 2020-01-07 – 2020-01-08 (×4): 3.125 mg via ORAL
  Filled 2020-01-07 (×4): qty 1

## 2020-01-07 MED ORDER — MELATONIN 3 MG PO TABS
3.0000 mg | ORAL_TABLET | Freq: Every day | ORAL | Status: DC
  Administered 2020-01-07: 3 mg via ORAL
  Filled 2020-01-07 (×3): qty 1

## 2020-01-07 MED ORDER — LEVALBUTEROL HCL 0.63 MG/3ML IN NEBU: 0.6300 mg | INHALATION_SOLUTION | Freq: Four times a day (QID) | RESPIRATORY_TRACT | Status: DC | PRN

## 2020-01-07 MED ORDER — SPIRONOLACTONE 25 MG PO TABS
25.0000 mg | ORAL_TABLET | Freq: Every day | ORAL | Status: DC
  Administered 2020-01-07 – 2020-01-08 (×2): 25 mg
  Filled 2020-01-07 (×2): qty 1

## 2020-01-07 MED ORDER — LOSARTAN POTASSIUM 25 MG PO TABS
12.5000 mg | ORAL_TABLET | Freq: Every day | ORAL | Status: DC
  Administered 2020-01-07 – 2020-01-08 (×2): 12.5 mg via ORAL
  Filled 2020-01-07 (×2): qty 1

## 2020-01-07 NOTE — Progress Notes (Signed)
CARDIAC REHAB PHASE I   PRE:  Rate/Rhythm: 64 SR    BP: sitting 113/60    SaO2: 96 1L  MODE:  Ambulation: 40 ft   POST:  Rate/Rhythm: 67 SR    BP: sitting 112/66     SaO2: 96 RA  Pt c/o not sleeping well last night. Took a little nap just now. Able to move out of bed and stand independently. Began walking with RW with standby assist. However after 20 ft in hall pt stopped and c/o extreme fatigue. Denied dizziness. Pt didn't feel he could go on. Became irritable. Returned to recliner, VSS. RN present. Encouraged pt to walk with staff later as this was his first attempt today. He is eager to get good sleep tonight.  [place], ACSM 01/07/2020 2:21 PM

## 2020-01-07 NOTE — Progress Notes (Signed)
Patient ID: David Perez, male   DOB: [birth year], 79 y.o.   MRN: [id number]     Advanced Heart Failure Rounding Note  PCP-Cardiologist: No primary care provider on file.   Subjective:    1/28: CABG (LIMA-LAD, SVG-LPDA, SVG-OM1), Maze + LA appendage clip, bioprosthetic AVR.  1/29: Extubated, IABP removed  Yesterday milrinone was stopped. CO-OX 58%. Also started on spiro and digoxin. Passed swallow evaluation.   Denies SOB.    Objective:   Weight Range: 60.1 kg Body mass index is 20.75 kg/m.   Vital Signs:   Temp:  [97.8 F (36.6 C)-98.4 F (36.9 C)] 98.4 F (36.9 C) (02/04 0400) Pulse Rate:  [68-89] 86 (02/04 0700) Resp:  [17-30] 19 (02/04 0700) BP: (104-154)/(60-105) 144/66 (02/04 0600) SpO2:  [89 %-98 %] 94 % (02/04 0700) Weight:  [60.1 kg] 60.1 kg (02/04 0500) Last BM Date: 01/06/20  Weight change: Filed Weights   01/05/20 0500 01/06/20 0608 01/07/20 0500  Weight: 62.3 kg 59.2 kg 60.1 kg    Intake/Output:   Intake/Output Summary (Last 24 hours) at 01/07/2020 0748 Last data filed at 01/07/2020 0600 Gross per 24 hour  Intake 1076.71 ml  Output 1101 ml  Net -24.29 ml      Physical Exam  CVP 3.  General:  Sitting on the side of the bed. No resp difficulty HEENT: normal Neck: supple. no JVD. Carotids 2+ bilat; no bruits. No lymphadenopathy or thryomegaly appreciated. Cor: PMI nondisplaced. Regular rate & rhythm. No rubs, gallops or murmurs.Sternal inicision approximated.  Lungs: Decreased  Abdomen: soft, nontender, nondistended. No hepatosplenomegaly. No bruits or masses. Good bowel sounds. Extremities: no cyanosis, clubbing, rash, edema Neuro: alert & orientedx3, cranial nerves grossly intact. moves all 4 extremities w/o difficulty. Affect pleasant    Telemetry   NSR 70-80s   Labs    CBC Recent Labs    01/06/20 0523 01/07/20 0503  WBC 16.3* 16.2*  NEUTROABS 13.9* 13.0*  HGB 12.5* 11.9*  HCT 38.8* 37.0*  MCV 101.3* 101.6*  PLT 126* 129*   Basic  Metabolic Panel Recent Labs    01/06/20 0523 01/07/20 0503  NA 150* 148*  K 3.7 4.0  CL 110 113*  CO2 29 27  GLUCOSE 182* 117*  BUN 36* 25*  CREATININE 1.27* 1.27*  CALCIUM 8.3* 8.3*   Liver Function Tests Recent Labs    01/06/20 0523 01/07/20 0503  AST 225* 102*  ALT 620* 445*  ALKPHOS 68 62  BILITOT 3.8* 3.5*  PROT 5.3* 5.2*  ALBUMIN 2.9* 2.6*   No results for input(s): LIPASE, AMYLASE in the last 72 hours. Cardiac Enzymes No results for input(s): CKTOTAL, CKMB, CKMBINDEX, TROPONINI in the last 72 hours.  BNP: BNP (last 3 results) No results for input(s): BNP in the last 8760 hours.  ProBNP (last 3 results) No results for input(s): PROBNP in the last 8760 hours.   D-Dimer No results for input(s): DDIMER in the last 72 hours. Hemoglobin A1C No results for input(s): HGBA1C in the last 72 hours. Fasting Lipid Panel No results for input(s): CHOL, HDL, LDLCALC, TRIG, CHOLHDL, LDLDIRECT in the last 72 hours. Thyroid Function Tests No results for input(s): TSH, T4TOTAL, T3FREE, THYROIDAB in the last 72 hours.  Invalid input(s): FREET3  Other results:   Imaging    ECHOCARDIOGRAM LIMITED  Result Date: 01/06/2020   ECHOCARDIOGRAM LIMITED REPORT   Patient Name:   Pamelia Center Date of Exam: 01/06/2020 Medical Rec #:  [id number]   Height:  67.0 in Accession #:    [id number]  Weight:       130.5 lb Date of Birth:  September 02, 1941   BSA:          1.69 m Patient Age:    13 years    BP:           153/90 mmHg Patient Gender: M           HR:           87 bpm. Exam Location:  Inpatient  Procedure: 2D Echo, Cardiac Doppler and Color Doppler Indications:    Pericardal effusion  History:        Patient has prior history of Echocardiogram examinations, most                 recent 12/31/2019. Aortic Valve: A Procedure Date: 12/31/19  Sonographer:    Dustin Flock Referring Phys: [place]  1. Left ventricular ejection fraction, by visual estimation, is 30%.  Septal hypokinesis and abnormal septal motion.  2. Small posterior pericardial effusion. Study not performed to assess for tamponade or constrictive physiology.  3. Global right ventricle was not well visualized, but appears severely hypokinetic in limited views.The right ventricular size is NWV.  4. Moderate to severe mitral annular calcification. FINDINGS  Left Ventricle: Left ventricular ejection fraction, by visual estimation, is 30%. Right Ventricle: The right ventricular size is NWV. Global RV systolic function is was not well visualized. Pericardium: A small pericardial effusion is present is seen. A small pericardial effusion is present. Mitral Valve: Moderate to severe mitral annular calcification. No evidence of mitral valve regurgitation. Aortic Valve: Valve is present in the aortic position. Procedure Date: 12/31/19. 21 mm Edwards Intuity bovine bioprosthetic. Valve not well visualized or assessed on today's exam.  LEFT VENTRICLE         Normals PLAX 2D LVIDd:         3.45 cm 3.6 cm LVIDs:         2.73 cm 1.7 cm LV PW:         1.06 cm 1.4 cm LV IVS:        0.86 cm 1.3 cm LV SV:         21 ml   79 ml LV SV Index:   12.80   45 ml/m2   Cherlynn Kaiser MD Electronically signed by Cherlynn Kaiser MD Signature Date/Time: 01/06/2020/4:43:45 PM    Final      Medications:     Scheduled Medications: . sodium chloride   Intravenous Once  . sodium chloride   Intravenous Once  . sodium chloride   Intravenous Once  . amiodarone  200 mg Oral BID  . aspirin  325 mg Oral Daily  . atorvastatin  80 mg Oral q1800  . bisacodyl  10 mg Oral Daily   Or  . bisacodyl  10 mg Rectal Daily  . chlorhexidine gluconate (MEDLINE KIT)  15 mL Mouth Rinse BID  . Chlorhexidine Gluconate Cloth  6 each Topical Daily  . colchicine  0.6 mg Oral Daily  . digoxin  0.0625 mg Oral Daily  . docusate sodium  200 mg Oral Daily  . enoxaparin (LOVENOX) injection  40 mg Subcutaneous Q24H  . feeding supplement (ENSURE ENLIVE)  237 mL  Oral TID BM  . free water  200 mL Per Tube Q6H  . insulin aspart  0-24 Units Subcutaneous Q4H  . levalbuterol  0.63 mg Nebulization TID  . magic mouthwash  5 mL Oral TID  . pantoprazole (PROTONIX) IV  40 mg Intravenous Q24H  . sodium chloride flush  10-40 mL Intracatheter Q12H  . sodium chloride flush  3 mL Intravenous Q12H  . spironolactone  12.5 mg Per Tube Daily    Infusions: . ceFEPime (MAXIPIME) IV 2 g (01/06/20 2030)  . feeding supplement (OSMOLITE 1.2 CAL) Stopped (01/06/20 1700)  . lactated ringers    . lactated ringers    . lactated ringers Stopped (01/06/20 1030)    PRN Medications: lactated ringers, metoprolol tartrate, ondansetron (ZOFRAN) IV, oxyCODONE, sodium chloride flush, sodium chloride flush   Assessment/Plan   1. CAD: STEMI with very short LM and 99% ostial LAD. PCI thought to risk compromise of ostial large LCx, so decision made to defer PCI and transfer to [facility] for CABG as patient was CP-free at that point. Now s/p CABG with LIMA-LAD, SVG-OM2, SVG-LPDA.   - Continue ASA.  - Continue atorvastatin 80 mg daily when he can take po.  2. Cardiogenic shock: Pre-op echo with EF 35%.  Now s/p CABG-AVR.   - CO-OX  57%. Stable off milrinone.  - Increase spiro to 25 mg daily.   - Continue digoxin 0.0625 mg daily. - Add 12.5 mg losartan daily.   -3. Atrial fibrillation: S/p Maze, LA appendage clip.  Maintaining NSR.   - Continue amiodarone 200 mg bid  - Will need eventual anticoagulation, use DVT prophylaxis Lovenox for now.  4. Hyperlipidemia: atorvastatin when taking pills.   5. Thrombocytopenia: Post-op, trending up.  6. Acute hypoxemic respiratory failure: Pulmonary edema. Now on 2L [place].  - Improved.  7. AKI: Creatinine trending down 1.3 >1.27  8. Neuro: Agitated delirium with paranoia.  Daughter is able to re-orient. Improved today.  9. ID: WBCs 25 => 19 => 16.6 => 15=>16.3=>16.2 , afebrile.  On cefepime.  - Cultures NGTD  10. Anemia: Post-op, got 1 unit  PRBCs 1/31. Hgb 12.5>11.9 .  11. Dysphagia: Failed swallow again yesterday, reassess today.  Will need Cortrak if fails.  12. Elevated LFTs: ?Shock liver from hypotensive episode immediately post-op.  He has not had statin or amiodarone as he cannot swallow. LFTs now trending down.  - RUQ US unremarkable.   98. Mobilize, up to chair when he is awake.  Work with PT. CIR consulted.  14. Hypernatremia - Sodium 148 , start free water.   Moving to [facility] today. Looks great.   Length of Stay: [place], NP  01/07/2020, 7:48 AM  Advanced Heart Failure Team Pager [phone] (M-F; 7a - 4p)  Please contact [facility] for night-coverage after hours (4p -7a ) and weekends on [email or web address]

## 2020-01-07 NOTE — Progress Notes (Signed)
7 Days Post-Op Procedure(s) (LRB): CORONARY ARTERY BYPASS GRAFTING (CABG) TIMES THREE USING LEFT INTERNAL MAMMARY ARTERY AND LEFT GREATER SAPHENOUS LEG VEIN HARVESTED ENDOSCOPICALLY (N/A) AORTIC VALVE REPLACEMENT (AVR) (N/A) MAZE (N/A) TRANSESOPHAGEAL ECHOCARDIOGRAM (TEE) (N/A) INDOCYANINE GREEN FLUORESCENCE IMAGING (ICG) (N/A) Clipping Of Atrial Appendage (Left) Subjective: Feeling better  Objective: Vital signs in last 24 hours: Temp:  [97.8 F (36.6 C)-98.4 F (36.9 C)] 98.4 F (36.9 C) (02/04 0400) Pulse Rate:  [68-89] 86 (02/04 0700) Cardiac Rhythm: Normal sinus rhythm (02/04 0400) Resp:  [17-30] 19 (02/04 0700) BP: (104-154)/(60-105) 144/66 (02/04 0600) SpO2:  [89 %-98 %] 94 % (02/04 0700) Weight:  [60.1 kg] 60.1 kg (02/04 0500)  Hemodynamic parameters for last 24 hours:    Intake/Output from previous day: 02/03 0701 - 02/04 0700 In: 1076.7 [P.O.:400; I.V.:69.3; NG/GT:295; IV Piggyback:312.4] Out: 1101 [Urine:1100; Stool:1] Intake/Output this shift: No intake/output data recorded.  General appearance: alert, cooperative and appears stated age Neurologic: intact Heart: regular rate and rhythm, S1, S2 normal, no murmur, click, rub or gallop Lungs: clear to auscultation bilaterally Abdomen: soft, non-tender; bowel sounds normal; no masses,  no organomegaly Extremities: extremities normal, atraumatic, no cyanosis or edema  Lab Results: Recent Labs    01/06/20 0523 01/07/20 0503  WBC 16.3* 16.2*  HGB 12.5* 11.9*  HCT 38.8* 37.0*  PLT 126* 129*   BMET:  Recent Labs    01/06/20 0523 01/07/20 0503  NA 150* 148*  K 3.7 4.0  CL 110 113*  CO2 29 27  GLUCOSE 182* 117*  BUN 36* 25*  CREATININE 1.27* 1.27*  CALCIUM 8.3* 8.3*    PT/INR: No results for input(s): LABPROT, INR in the last 72 hours. ABG    Component Value Date/Time   PHART 7.509 (H) 01/01/2020 1514   HCO3 29.9 (H) 01/01/2020 1514   TCO2 31 01/01/2020 1514   ACIDBASEDEF 5.0 (H) 12/31/2019 2230    O2SAT 57.3 01/07/2020 0503   CBG (last 3)  Recent Labs    01/06/20 2005 01/06/20 2356 01/07/20 0407  GLUCAP 127* 104* 106*    Assessment/Plan: S/P Procedure(s) (LRB): CORONARY ARTERY BYPASS GRAFTING (CABG) TIMES THREE USING LEFT INTERNAL MAMMARY ARTERY AND LEFT GREATER SAPHENOUS LEG VEIN HARVESTED ENDOSCOPICALLY (N/A) AORTIC VALVE REPLACEMENT (AVR) (N/A) MAZE (N/A) TRANSESOPHAGEAL ECHOCARDIOGRAM (TEE) (N/A) INDOCYANINE GREEN FLUORESCENCE IMAGING (ICG) (N/A) Clipping Of Atrial Appendage (Left) Mobilize Diuresis Plan for transfer to step-down: see transfer orders   LOS: 7 days    Wonda Olds 01/07/2020

## 2020-01-07 NOTE — Plan of Care (Signed)

## 2020-01-07 NOTE — Progress Notes (Signed)
Patient ID: David Perez, male   DOB: 21-Apr-1941, 79 y.o.   MRN: [id number]     Advanced Heart Failure Rounding Note  PCP-Cardiologist: No primary care provider on file.   Subjective:    1/28: CABG (LIMA-LAD, SVG-LPDA, SVG-OM1), Maze + LA appendage clip, bioprosthetic AVR.  1/29: Extubated, IABP removed  He is off milrinone now, co-ox 57% with CVP < 5.  He is walking in the hall, passed swallow evaluation and eating/drinking.  Sodium trending down. LFTs trending down.   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  Mild CRP elevation but ESR not high.   Echo: EF 30%, small posterior pericardial effusion, decreased RV systolic function.   Objective:   Weight Range: 60.1 kg Body mass index is 20.75 kg/m.   Vital Signs:   Temp:  [97.2 F (36.2 C)-98.4 F (36.9 C)] 97.2 F (36.2 C) (02/04 0700) Pulse Rate:  [68-89] 86 (02/04 0700) Resp:  [19-30] 19 (02/04 0700) BP: (104-154)/(60-105) 144/66 (02/04 0600) SpO2:  [90 %-98 %] 94 % (02/04 0700) Weight:  [60.1 kg] 60.1 kg (02/04 0500) Last BM Date: 01/06/20  Weight change: Filed Weights   01/05/20 0500 01/06/20 0608 01/07/20 0500  Weight: 62.3 kg 59.2 kg 60.1 kg    Intake/Output:   Intake/Output Summary (Last 24 hours) at 01/07/2020 0807 Last data filed at 01/07/2020 0600 Gross per 24 hour  Intake 1024.17 ml  Output 1051 ml  Net -26.83 ml      Physical Exam  CVP <5 General: NAD Neck: No JVD, no thyromegaly or thyroid nodule.  Lungs: Clear to auscultation bilaterally with normal respiratory effort. CV: Nondisplaced PMI.  Heart regular S1/S2, no S3/S4, soft friction rub  Trace ankle edema.   Abdomen: Soft, nontender, no hepatosplenomegaly, no distention.  Skin: Intact without lesions or rashes.  Neurologic: Alert and oriented x 3.  Psych: Normal affect. Extremities: No clubbing or cyanosis.  HEENT: Normal.    Telemetry   NSR 80s (personally reviewed)  Labs     CBC Recent Labs    01/06/20 0523 01/07/20 0503  WBC 16.3* 16.2*  NEUTROABS 13.9* 13.0*  HGB 12.5* 11.9*  HCT 38.8* 37.0*  MCV 101.3* 101.6*  PLT 126* 016*   Basic Metabolic Panel Recent Labs    01/06/20 0523 01/07/20 0503  NA 150* 148*  K 3.7 4.0  CL 110 113*  CO2 29 27  GLUCOSE 182* 117*  BUN 36* 25*  CREATININE 1.27* 1.27*  CALCIUM 8.3* 8.3*   Liver Function Tests Recent Labs    01/06/20 0523 01/07/20 0503  AST 225* 102*  ALT 620* 445*  ALKPHOS 68 62  BILITOT 3.8* 3.5*  PROT 5.3* 5.2*  ALBUMIN 2.9* 2.6*   No results for input(s): LIPASE, AMYLASE in the last 72 hours. Cardiac Enzymes No results for input(s): CKTOTAL, CKMB, CKMBINDEX, TROPONINI in the last 72 hours.  BNP: BNP (last 3 results) No results for input(s): BNP in the last 8760 hours.  ProBNP (last 3 results) No results for input(s): PROBNP in the last 8760 hours.   D-Dimer No results for input(s): DDIMER in the last 72 hours. Hemoglobin A1C No results for input(s): HGBA1C in the last 72 hours. Fasting Lipid Panel No results for input(s): CHOL, HDL, LDLCALC, TRIG, CHOLHDL, LDLDIRECT in the last 72 hours. Thyroid Function Tests No results for input(s): TSH, T4TOTAL, T3FREE, THYROIDAB in the last 72 hours.  Invalid input(s): FREET3  Other results:   Imaging    ECHOCARDIOGRAM  LIMITED  Result Date: 01/06/2020   ECHOCARDIOGRAM LIMITED REPORT   Patient Name:   David Perez Date of Exam: 01/06/2020 Medical Rec #:  [id number]   Height:       67.0 in Accession #:    [id number]  Weight:       130.5 lb Date of Birth:  21-Aug-1941   BSA:          1.69 m Patient Age:    17 years    BP:           153/90 mmHg Patient Gender: M           HR:           87 bpm. Exam Location:  Inpatient  Procedure: 2D Echo, Cardiac Doppler and Color Doppler Indications:    Pericardal effusion  History:        Patient has prior history of Echocardiogram examinations, most                 recent 12/31/2019. Aortic Valve: A  Procedure Date: 12/31/19  Sonographer:    Dustin Flock Referring Phys: [place]  1. Left ventricular ejection fraction, by visual estimation, is 30%. Septal hypokinesis and abnormal septal motion.  2. Small posterior pericardial effusion. Study not performed to assess for tamponade or constrictive physiology.  3. Global right ventricle was not well visualized, but appears severely hypokinetic in limited views.The right ventricular size is NWV.  4. Moderate to severe mitral annular calcification. FINDINGS  Left Ventricle: Left ventricular ejection fraction, by visual estimation, is 30%. Right Ventricle: The right ventricular size is NWV. Global RV systolic function is was not well visualized. Pericardium: A small pericardial effusion is present is seen. A small pericardial effusion is present. Mitral Valve: Moderate to severe mitral annular calcification. No evidence of mitral valve regurgitation. Aortic Valve: Valve is present in the aortic position. Procedure Date: 12/31/19. 21 mm Edwards Intuity bovine bioprosthetic. Valve not well visualized or assessed on today's exam.  LEFT VENTRICLE         Normals PLAX 2D LVIDd:         3.45 cm 3.6 cm LVIDs:         2.73 cm 1.7 cm LV PW:         1.06 cm 1.4 cm LV IVS:        0.86 cm 1.3 cm LV SV:         21 ml   79 ml LV SV Index:   12.80   45 ml/m2   Cherlynn Kaiser MD Electronically signed by Cherlynn Kaiser MD Signature Date/Time: 01/06/2020/4:43:45 PM    Final      Medications:     Scheduled Medications: . sodium chloride   Intravenous Once  . sodium chloride   Intravenous Once  . sodium chloride   Intravenous Once  . amiodarone  200 mg Oral BID  . aspirin  325 mg Oral Daily  . atorvastatin  80 mg Oral q1800  . bisacodyl  10 mg Oral Daily   Or  . bisacodyl  10 mg Rectal Daily  . chlorhexidine gluconate (MEDLINE KIT)  15 mL Mouth Rinse BID  . Chlorhexidine Gluconate Cloth  6 each Topical Daily  . colchicine  0.6 mg Oral Daily  .  digoxin  0.0625 mg Oral Daily  . docusate sodium  200 mg Oral Daily  . enoxaparin (LOVENOX) injection  40 mg Subcutaneous Q24H  . feeding supplement (ENSURE ENLIVE)  237 mL Oral TID BM  . free water  200 mL Per Tube Q6H  . insulin aspart  0-24 Units Subcutaneous Q4H  . levalbuterol  0.63 mg Nebulization TID  . magic mouthwash  5 mL Oral TID  . pantoprazole (PROTONIX) IV  40 mg Intravenous Q24H  . sodium chloride flush  10-40 mL Intracatheter Q12H  . sodium chloride flush  3 mL Intravenous Q12H  . spironolactone  12.5 mg Per Tube Daily    Infusions: . ceFEPime (MAXIPIME) IV 2 g (01/06/20 2030)  . feeding supplement (OSMOLITE 1.2 CAL) Stopped (01/06/20 1700)  . lactated ringers    . lactated ringers    . lactated ringers Stopped (01/06/20 1030)    PRN Medications: lactated ringers, metoprolol tartrate, ondansetron (ZOFRAN) IV, oxyCODONE, sodium chloride flush, sodium chloride flush   Assessment/Plan   1. CAD: STEMI with very short LM and 99% ostial LAD. PCI thought to risk compromise of ostial large LCx, so decision made to defer PCI and transfer to [facility] for CABG as patient was CP-free at that point. Now s/p CABG with LIMA-LAD, SVG-OM2, SVG-LPDA.   - Continue ASA.  - Continue atorvastatin 80 mg daily 2. Cardiogenic shock: Pre-op echo with EF 35%.  Now s/p CABG-AVR.  Echo post-op with EF 30%, RV dysfunction. Co-ox 57% off milrinone, CVP < 5.  - Can hold off Lasix today with low CVP.  Add ted hose.  - Can start low dose Coreg 3.125 mg bid.  - Continue spironolactone 12.5 daily.  - Continue digoxin 0.0625 daily.  - If creatinine continues to trend down, will add losartan vs Entresto tomorrow (depending on BP).   3. Atrial fibrillation: S/p Maze, LA appendage clip.  NSR today.  - Amiodarone gtt is off, continue amiodarone 200 mg bid.   - Will need eventual anticoagulation, use DVT prophylaxis Lovenox for now.  4. Hyperlipidemia: atorvastatin.    5. Thrombocytopenia: Post-op,  trending up.  6. Acute hypoxemic respiratory failure: Pulmonary edema. Now on 2L [place].  - Improved.  7. AKI: Creatinine trending down 1.27.  8. Delirium: Resolved.  9. ID: WBCs 25 => 19 => 16.6 => 15=>16.3=>16 , afebrile.  On cefepime.  Cultures NGTD.  - Stop at 5 days.  10. Anemia: Post-op, got 1 unit PRBCs 1/31. Hgb remains stable.  11. Dysphagia: Passed swallow study, can stop free water.   12. Elevated LFTs: ?Shock liver from hypotensive episode immediately post-op. LFTs now trending down.  RUQ US unremarkable.   4. Mobilize, up to chair when he is awake.  Work with PT. CIR consulted.  14. Hypernatremia: Resolving on po.  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  CRP up but not ESR.  Symptoms resolving.   - Continue colchicine.   Length of Stay: 7  Loralie Champagne, MD  01/07/2020, 8:07 AM  Advanced Heart Failure Team Pager [phone] (M-F; 7a - 4p)  Please contact [facility] for night-coverage after hours (4p -7a ) and weekends on [email or web address]

## 2020-01-07 NOTE — Progress Notes (Signed)
RN to remove CVC per order.  PIV obtained.

## 2020-01-07 NOTE — Progress Notes (Signed)
Pt received from 2H. VSS. Telemetry applied. Pt oriented to room and unit. Call light in reach.   Joaopedro Eschbach, RN  

## 2020-01-07 NOTE — Progress Notes (Signed)
Nutrition Follow-up  DOCUMENTATION CODES:   Not applicable  INTERVENTION:   Ensure Enlive po BID with meals, each supplement provides 350 kcal and 20 grams of protein  Recommend removal of Carb Modified to current diet order  NUTRITION DIAGNOSIS:   Increased nutrient needs related to post-op healing as evidenced by estimated needs.  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 Clayborn Bigness for evaluation of CABG. Patient presented to [facility] ED with severe chest pressure in the central chest, ECG showed diffuse ST depression with STE in VI and AVR and taken to cath lab emergently, post-cath pt went into atrial fibrillation with RVR, amiodarone gtt and norepinephrine gtt started and pt transferred fot [facility].  1/28 CABG x 3 1/39 Extubated, IABP removed 2/02 Cortrak inserted, TF initiated 2/03 Diet advanced to Heart Healthy/Carb Modified 2/04 Cortrak removed  Pt alert this AM, sitting up in chair. Pt has been walking in hallway as well  Pt ate 80% at dinner last night, ate well at breakfast this AM per RN. Appetite good, pt is very motivated  Cortrak removed this AM, TF discontinued  Pt without hx of DM; only on sliding scale coverage (CBGs 100-147). Does not take nay meds at home for DM. Recommend removal of carb modified restriction to current diet order  Labs: sodium 148 (H), Creatinine 1.27, BUN 25 Meds: reviewed   Diet Order:   Diet Order            Diet heart healthy/carb modified Room service appropriate? Yes; Fluid consistency: Thin  Diet effective now              EDUCATION NEEDS:   No education needs have been identified at this  time  Skin:  Skin Assessment: Skin Integrity Issues: Skin Integrity Issues:: Incisions Incisions: closed; rt leg  Last BM:  1/31  Height:   Ht Readings from Last 1 Encounters:  12/31/19 5\' 7"  (1.702 m)    Weight:   Wt Readings from Last 1 Encounters:  01/07/20 60.1 kg    Ideal Body Weight:  67.3 kg  BMI:  Body mass index is 20.75 kg/m.  Estimated Nutritional Needs:   Kcal:  1750-1950 kcals  Protein:  85-100 g  Fluid:  >/= 1.7 L/day    Kerman Passey MS, RDN, LDN, CNSC RD Pager Number and Weekend/On-Call After Hours Pager Located in [place]

## 2020-01-08 ENCOUNTER — Other Ambulatory Visit: Payer: Self-pay

## 2020-01-08 ENCOUNTER — Encounter (HOSPITAL_COMMUNITY): Payer: Self-pay | Admitting: Physical Medicine and Rehabilitation

## 2020-01-08 ENCOUNTER — Inpatient Hospital Stay (HOSPITAL_COMMUNITY)
Admission: RE | Admit: 2020-01-08 | Discharge: 2020-01-29 | DRG: 945 | Disposition: A | Discharge status: Home Health | Payer: Medicare Other | Source: Intra-hospital | Attending: Physical Medicine and Rehabilitation | Admitting: Physical Medicine and Rehabilitation

## 2020-01-08 DIAGNOSIS — I313 Pericardial effusion (noninflammatory): Secondary | ICD-10-CM | POA: Diagnosis not present

## 2020-01-08 DIAGNOSIS — R5381 Other malaise: Secondary | ICD-10-CM | POA: Diagnosis present

## 2020-01-08 DIAGNOSIS — N179 Acute kidney failure, unspecified: Secondary | ICD-10-CM | POA: Diagnosis present

## 2020-01-08 DIAGNOSIS — J90 Pleural effusion, not elsewhere classified: Secondary | ICD-10-CM

## 2020-01-08 DIAGNOSIS — E785 Hyperlipidemia, unspecified: Secondary | ICD-10-CM | POA: Diagnosis present

## 2020-01-08 DIAGNOSIS — I2101 ST elevation (STEMI) myocardial infarction involving left main coronary artery: Secondary | ICD-10-CM | POA: Diagnosis present

## 2020-01-08 DIAGNOSIS — D72829 Elevated white blood cell count, unspecified: Secondary | ICD-10-CM | POA: Diagnosis present

## 2020-01-08 DIAGNOSIS — I2510 Atherosclerotic heart disease of native coronary artery without angina pectoris: Secondary | ICD-10-CM | POA: Diagnosis present

## 2020-01-08 DIAGNOSIS — R64 Cachexia: Secondary | ICD-10-CM | POA: Diagnosis present

## 2020-01-08 DIAGNOSIS — Z981 Arthrodesis status: Secondary | ICD-10-CM

## 2020-01-08 DIAGNOSIS — I5022 Chronic systolic (congestive) heart failure: Secondary | ICD-10-CM

## 2020-01-08 DIAGNOSIS — J439 Emphysema, unspecified: Secondary | ICD-10-CM | POA: Diagnosis present

## 2020-01-08 DIAGNOSIS — Z951 Presence of aortocoronary bypass graft: Secondary | ICD-10-CM

## 2020-01-08 DIAGNOSIS — D696 Thrombocytopenia, unspecified: Secondary | ICD-10-CM | POA: Diagnosis present

## 2020-01-08 DIAGNOSIS — J9601 Acute respiratory failure with hypoxia: Secondary | ICD-10-CM | POA: Diagnosis not present

## 2020-01-08 DIAGNOSIS — D539 Nutritional anemia, unspecified: Secondary | ICD-10-CM | POA: Diagnosis present

## 2020-01-08 DIAGNOSIS — I4891 Unspecified atrial fibrillation: Secondary | ICD-10-CM | POA: Diagnosis present

## 2020-01-08 DIAGNOSIS — I5023 Acute on chronic systolic (congestive) heart failure: Secondary | ICD-10-CM | POA: Diagnosis present

## 2020-01-08 DIAGNOSIS — I5021 Acute systolic (congestive) heart failure: Secondary | ICD-10-CM | POA: Diagnosis not present

## 2020-01-08 DIAGNOSIS — B1910 Unspecified viral hepatitis B without hepatic coma: Secondary | ICD-10-CM | POA: Diagnosis present

## 2020-01-08 DIAGNOSIS — G4700 Insomnia, unspecified: Secondary | ICD-10-CM | POA: Diagnosis not present

## 2020-01-08 DIAGNOSIS — Z681 Body mass index (BMI) 19 or less, adult: Secondary | ICD-10-CM

## 2020-01-08 DIAGNOSIS — E870 Hyperosmolality and hypernatremia: Secondary | ICD-10-CM | POA: Diagnosis present

## 2020-01-08 DIAGNOSIS — Z87891 Personal history of nicotine dependence: Secondary | ICD-10-CM | POA: Diagnosis not present

## 2020-01-08 DIAGNOSIS — I509 Heart failure, unspecified: Secondary | ICD-10-CM

## 2020-01-08 DIAGNOSIS — J189 Pneumonia, unspecified organism: Secondary | ICD-10-CM | POA: Diagnosis not present

## 2020-01-08 DIAGNOSIS — D62 Acute posthemorrhagic anemia: Secondary | ICD-10-CM | POA: Diagnosis present

## 2020-01-08 DIAGNOSIS — R0602 Shortness of breath: Secondary | ICD-10-CM

## 2020-01-08 DIAGNOSIS — R001 Bradycardia, unspecified: Secondary | ICD-10-CM | POA: Diagnosis not present

## 2020-01-08 DIAGNOSIS — D649 Anemia, unspecified: Secondary | ICD-10-CM | POA: Diagnosis present

## 2020-01-08 DIAGNOSIS — I350 Nonrheumatic aortic (valve) stenosis: Secondary | ICD-10-CM | POA: Diagnosis present

## 2020-01-08 LAB — CBC WITH DIFFERENTIAL/PLATELET
Abs Immature Granulocytes: 0.52 10*3/uL — ABNORMAL HIGH (ref 0.00–0.07)
Basophils Absolute: 0.1 10*3/uL (ref 0.0–0.1)
Basophils Relative: 0 %
Eosinophils Absolute: 0.7 10*3/uL — ABNORMAL HIGH (ref 0.0–0.5)
Eosinophils Relative: 5 %
HCT: 37.7 % — ABNORMAL LOW (ref 39.0–52.0)
Hemoglobin: 12.1 g/dL — ABNORMAL LOW (ref 13.0–17.0)
Immature Granulocytes: 3 %
Lymphocytes Relative: 9 %
Lymphs Abs: 1.4 10*3/uL (ref 0.7–4.0)
MCH: 32.9 pg (ref 26.0–34.0)
MCHC: 32.1 g/dL (ref 30.0–36.0)
MCV: 102.4 fL — ABNORMAL HIGH (ref 80.0–100.0)
Monocytes Absolute: 1.1 10*3/uL — ABNORMAL HIGH (ref 0.1–1.0)
Monocytes Relative: 7 %
Neutro Abs: 12.7 10*3/uL — ABNORMAL HIGH (ref 1.7–7.7)
Neutrophils Relative %: 76 %
Platelets: 156 10*3/uL (ref 150–400)
RBC: 3.68 MIL/uL — ABNORMAL LOW (ref 4.22–5.81)
RDW: 18.7 % — ABNORMAL HIGH (ref 11.5–15.5)
WBC: 16.5 10*3/uL — ABNORMAL HIGH (ref 4.0–10.5)
nRBC: 0.4 % — ABNORMAL HIGH (ref 0.0–0.2)

## 2020-01-08 LAB — BASIC METABOLIC PANEL
Anion gap: 11 (ref 5–15)
BUN: 27 mg/dL — ABNORMAL HIGH (ref 8–23)
CO2: 25 mmol/L (ref 22–32)
Calcium: 8.6 mg/dL — ABNORMAL LOW (ref 8.9–10.3)
Chloride: 111 mmol/L (ref 98–111)
Creatinine, Ser: 1.2 mg/dL (ref 0.61–1.24)
GFR calc Af Amer: >60 mL/min (ref >60)
GFR calc non Af Amer: 57 mL/min — ABNORMAL LOW (ref >60)
Glucose, Bld: 129 mg/dL — ABNORMAL HIGH (ref 70–99)
Potassium: 4.4 mmol/L (ref 3.5–5.1)
Sodium: 147 mmol/L — ABNORMAL HIGH (ref 135–145)

## 2020-01-08 LAB — GLUCOSE, CAPILLARY
Glucose-Capillary: 108 mg/dL — ABNORMAL HIGH (ref 70–99)
Glucose-Capillary: 113 mg/dL — ABNORMAL HIGH (ref 70–99)
Glucose-Capillary: 138 mg/dL — ABNORMAL HIGH (ref 70–99)
Glucose-Capillary: 156 mg/dL — ABNORMAL HIGH (ref 70–99)
Glucose-Capillary: 158 mg/dL — ABNORMAL HIGH (ref 70–99)

## 2020-01-08 MED ORDER — LEVALBUTEROL HCL 0.63 MG/3ML IN NEBU
0.6300 mg | INHALATION_SOLUTION | Freq: Four times a day (QID) | RESPIRATORY_TRACT | Status: DC | PRN
  Administered 2020-01-18: 16:00:00 0.63 mg via RESPIRATORY_TRACT
  Filled 2020-01-08: qty 3

## 2020-01-08 MED ORDER — CARVEDILOL 3.125 MG PO TABS
3.1250 mg | ORAL_TABLET | Freq: Two times a day (BID) | ORAL | Status: DC
  Administered 2020-01-09 – 2020-01-29 (×40): 3.125 mg via ORAL
  Filled 2020-01-08 (×42): qty 1

## 2020-01-08 MED ORDER — DIGOXIN 62.5 MCG PO TABS: 0.0625 mg | ORAL_TABLET | Freq: Every day | ORAL | Status: DC

## 2020-01-08 MED ORDER — FUROSEMIDE 20 MG PO TABS: 20.0000 mg | ORAL_TABLET | Freq: Every day | ORAL | 11 refills | Status: DC

## 2020-01-08 MED ORDER — ALUM & MAG HYDROXIDE-SIMETH 200-200-20 MG/5ML PO SUSP: 30.0000 mL | ORAL | Status: DC | PRN

## 2020-01-08 MED ORDER — OXYCODONE HCL 5 MG PO TABS: 5.0000 mg | ORAL_TABLET | Freq: Four times a day (QID) | ORAL | Status: DC | PRN

## 2020-01-08 MED ORDER — DIGOXIN 125 MCG PO TABS
0.0625 mg | ORAL_TABLET | Freq: Every day | ORAL | Status: DC
  Administered 2020-01-09 – 2020-01-29 (×21): 0.0625 mg via ORAL
  Filled 2020-01-08 (×21): qty 1

## 2020-01-08 MED ORDER — APIXABAN 2.5 MG PO TABS: 2.5000 mg | ORAL_TABLET | Freq: Two times a day (BID) | ORAL | Status: DC

## 2020-01-08 MED ORDER — POLYETHYLENE GLYCOL 3350 17 G PO PACK: 17.0000 g | PACK | Freq: Every day | ORAL | Status: DC | PRN

## 2020-01-08 MED ORDER — ATORVASTATIN CALCIUM 80 MG PO TABS: 80.0000 mg | ORAL_TABLET | Freq: Every day | ORAL | Status: DC

## 2020-01-08 MED ORDER — LOSARTAN POTASSIUM 25 MG PO TABS: 12.5000 mg | ORAL_TABLET | Freq: Every day | ORAL | Status: DC

## 2020-01-08 MED ORDER — PROCHLORPERAZINE 25 MG RE SUPP: 12.5000 mg | Freq: Four times a day (QID) | RECTAL | Status: DC | PRN

## 2020-01-08 MED ORDER — AMIODARONE HCL 200 MG PO TABS: 200.0000 mg | ORAL_TABLET | Freq: Two times a day (BID) | ORAL | Status: DC

## 2020-01-08 MED ORDER — LEVALBUTEROL HCL 0.63 MG/3ML IN NEBU
0.6300 mg | INHALATION_SOLUTION | Freq: Four times a day (QID) | RESPIRATORY_TRACT | Status: DC
  Administered 2020-01-08: 0.63 mg via RESPIRATORY_TRACT
  Filled 2020-01-08 (×2): qty 3

## 2020-01-08 MED ORDER — COLCHICINE 0.6 MG PO TABS: 0.6000 mg | ORAL_TABLET | Freq: Every day | ORAL | Status: DC

## 2020-01-08 MED ORDER — COLCHICINE 0.6 MG PO TABS
0.6000 mg | ORAL_TABLET | Freq: Every day | ORAL | Status: DC
  Administered 2020-01-09 – 2020-01-18 (×10): 0.6 mg via ORAL
  Filled 2020-01-08 (×9): qty 1

## 2020-01-08 MED ORDER — LOSARTAN POTASSIUM 25 MG PO TABS
12.5000 mg | ORAL_TABLET | Freq: Every day | ORAL | Status: DC
  Administered 2020-01-09 – 2020-01-27 (×19): 12.5 mg via ORAL
  Filled 2020-01-08 (×19): qty 0.5

## 2020-01-08 MED ORDER — ASPIRIN 81 MG PO CHEW
81.0000 mg | CHEWABLE_TABLET | Freq: Every day | ORAL | Status: DC
  Administered 2020-01-09 – 2020-01-29 (×21): 81 mg via ORAL
  Filled 2020-01-08 (×20): qty 1

## 2020-01-08 MED ORDER — APIXABAN 2.5 MG PO TABS
2.5000 mg | ORAL_TABLET | Freq: Two times a day (BID) | ORAL | Status: DC
  Administered 2020-01-09 – 2020-01-29 (×41): 2.5 mg via ORAL
  Filled 2020-01-08 (×41): qty 1

## 2020-01-08 MED ORDER — CARVEDILOL 3.125 MG PO TABS: 3.1250 mg | ORAL_TABLET | Freq: Two times a day (BID) | ORAL | Status: DC

## 2020-01-08 MED ORDER — LEVALBUTEROL HCL 0.63 MG/3ML IN NEBU
0.6300 mg | INHALATION_SOLUTION | Freq: Four times a day (QID) | RESPIRATORY_TRACT | Status: DC
  Administered 2020-01-08: 0.63 mg via RESPIRATORY_TRACT
  Filled 2020-01-08: qty 3

## 2020-01-08 MED ORDER — CHLORHEXIDINE GLUCONATE 0.12% ORAL RINSE (MEDLINE KIT)
15.0000 mL | Freq: Two times a day (BID) | OROMUCOSAL | Status: DC
  Administered 2020-01-08 – 2020-01-09 (×3): 15 mL via OROMUCOSAL

## 2020-01-08 MED ORDER — LEVALBUTEROL HCL 0.63 MG/3ML IN NEBU: 0.6300 mg | INHALATION_SOLUTION | Freq: Three times a day (TID) | RESPIRATORY_TRACT | Status: DC

## 2020-01-08 MED ORDER — ENSURE ENLIVE PO LIQD
237.0000 mL | Freq: Three times a day (TID) | ORAL | Status: DC
  Administered 2020-01-12 – 2020-01-28 (×27): 237 mL via ORAL

## 2020-01-08 MED ORDER — ACETAMINOPHEN 325 MG PO TABS
325.0000 mg | ORAL_TABLET | ORAL | Status: DC | PRN
  Administered 2020-01-09 – 2020-01-24 (×7): 650 mg via ORAL
  Filled 2020-01-08 (×12): qty 2

## 2020-01-08 MED ORDER — SPIRONOLACTONE 25 MG PO TABS: 25.0000 mg | ORAL_TABLET | Freq: Every day | ORAL | Status: DC

## 2020-01-08 MED ORDER — BISACODYL 10 MG RE SUPP: 10.0000 mg | Freq: Every day | RECTAL | Status: DC | PRN

## 2020-01-08 MED ORDER — FLEET ENEMA 7-19 GM/118ML RE ENEM: 1.0000 | ENEMA | Freq: Once | RECTAL | Status: DC | PRN

## 2020-01-08 MED ORDER — LEVALBUTEROL HCL 0.63 MG/3ML IN NEBU: 0.6300 mg | INHALATION_SOLUTION | Freq: Four times a day (QID) | RESPIRATORY_TRACT | 12 refills | Status: DC

## 2020-01-08 MED ORDER — DOCUSATE SODIUM 100 MG PO CAPS
100.0000 mg | ORAL_CAPSULE | Freq: Two times a day (BID) | ORAL | Status: DC
  Filled 2020-01-08: qty 1

## 2020-01-08 MED ORDER — CHLORHEXIDINE GLUCONATE CLOTH 2 % EX PADS: 6.0000 | MEDICATED_PAD | Freq: Every day | CUTANEOUS | Status: DC

## 2020-01-08 MED ORDER — AMIODARONE HCL 200 MG PO TABS
200.0000 mg | ORAL_TABLET | Freq: Two times a day (BID) | ORAL | Status: DC
  Administered 2020-01-08 – 2020-01-19 (×22): 200 mg via ORAL
  Filled 2020-01-08 (×22): qty 1

## 2020-01-08 MED ORDER — ASPIRIN 81 MG PO CHEW: 81.0000 mg | CHEWABLE_TABLET | Freq: Every day | ORAL | Status: DC

## 2020-01-08 MED ORDER — PROCHLORPERAZINE MALEATE 5 MG PO TABS: 5.0000 mg | ORAL_TABLET | Freq: Four times a day (QID) | ORAL | Status: DC | PRN

## 2020-01-08 MED ORDER — ATORVASTATIN CALCIUM 80 MG PO TABS
80.0000 mg | ORAL_TABLET | Freq: Every day | ORAL | Status: DC
  Administered 2020-01-09 – 2020-01-28 (×20): 80 mg via ORAL
  Filled 2020-01-08 (×21): qty 1

## 2020-01-08 MED ORDER — DOCUSATE SODIUM 100 MG PO CAPS: 100.0000 mg | ORAL_CAPSULE | Freq: Two times a day (BID) | ORAL | Status: DC

## 2020-01-08 MED ORDER — GUAIFENESIN-DM 100-10 MG/5ML PO SYRP: 5.0000 mL | ORAL_SOLUTION | Freq: Four times a day (QID) | ORAL | Status: DC | PRN

## 2020-01-08 MED ORDER — DIPHENHYDRAMINE HCL 12.5 MG/5ML PO ELIX: 12.5000 mg | ORAL_SOLUTION | Freq: Four times a day (QID) | ORAL | Status: DC | PRN

## 2020-01-08 MED ORDER — APIXABAN 2.5 MG PO TABS
2.5000 mg | ORAL_TABLET | Freq: Two times a day (BID) | ORAL | Status: DC
  Administered 2020-01-08: 2.5 mg via ORAL
  Filled 2020-01-08: qty 1

## 2020-01-08 MED ORDER — OXYCODONE HCL 5 MG PO TABS: 5.0000 mg | ORAL_TABLET | ORAL | 0 refills | Status: DC | PRN

## 2020-01-08 MED ORDER — MELATONIN 3 MG PO TABS
3.0000 mg | ORAL_TABLET | Freq: Every day | ORAL | Status: DC
  Administered 2020-01-08 – 2020-01-10 (×3): 3 mg via ORAL
  Filled 2020-01-08 (×3): qty 1

## 2020-01-08 MED ORDER — ENSURE ENLIVE PO LIQD: 237.0000 mL | Freq: Three times a day (TID) | ORAL | 12 refills | Status: DC

## 2020-01-08 MED ORDER — MELATONIN 3 MG PO TABS: 3.0000 mg | ORAL_TABLET | Freq: Every day | ORAL | 0 refills | Status: DC

## 2020-01-08 MED ORDER — PROCHLORPERAZINE EDISYLATE 10 MG/2ML IJ SOLN: 5.0000 mg | Freq: Four times a day (QID) | INTRAMUSCULAR | Status: DC | PRN

## 2020-01-08 MED ORDER — BISACODYL 10 MG RE SUPP: 10.0000 mg | Freq: Once | RECTAL | Status: DC

## 2020-01-08 MED ORDER — POLYETHYLENE GLYCOL 3350 17 G PO PACK: 17.0000 g | PACK | Freq: Every day | ORAL | Status: DC

## 2020-01-08 NOTE — Progress Notes (Signed)
Pt transported via wheelchair to [phone] with all belongings.  Clyde Canterbury, RN

## 2020-01-08 NOTE — Progress Notes (Signed)
Patient ID: David Perez, male   DOB: [birth year], 79 y.o.   MRN: [id number] Patient arrived from 4E with RN, 2 Student RN's, and patient belongings. Patient oriented to room, nurse call system, schedule, rehab process, fall prevention plan, safety plan, and health resource notebook. No complaints of pain. Patient ordered breakfast and is resting in bed with call bell at side and bed alarm on.

## 2020-01-08 NOTE — TOC Transition Note (Signed)
Transition of Care [facility]) - CM/SW Discharge Note Marvetta Gibbons RN, BSN Transitions of Care Unit 4E- RN Case Manager [phone]   Patient Details  Name: David Perez MRN: [id number] Date of Birth: 1941-06-25  Transition of Care [facility]) CM/SW Contact:  Dawayne Patricia, RN Phone Number: 01/08/2020, 4:45 PM   Clinical Narrative:    Pt stable to transition to [facility] rehab today, have been notified by [facility] that bed has become available this afternoon. Pt/daughter agreeable to INPT rehab and have accepted bed offer. Pt to transition later this evening after EPW removed.    Final next level of care: IP Rehab Facility Barriers to Discharge: No Barriers Identified   Patient Goals and CMS Choice Patient states their goals for this hospitalization and ongoing recovery are:: rehab CMS Medicare.gov Compare Post Acute Care list provided to:: (daugther) Choice offered to / list presented to : Adult Children  Discharge Placement               [facility] INPT rehab        Discharge Plan and Services     Post Acute Care Choice: IP Rehab                               Social Determinants of Health (SDOH) Interventions     Readmission Risk Interventions No flowsheet data found.

## 2020-01-08 NOTE — Progress Notes (Addendum)
Patient ID: David Perez, male   DOB: 10-Aug-1941, 79 y.o.   MRN: [id number]     Advanced Heart Failure Rounding Note  PCP-Cardiologist: No primary care provider on file.   Subjective:    1/28: CABG (LIMA-LAD, SVG-LPDA, SVG-OM1), Maze + LA appendage clip, bioprosthetic AVR.   1/29: Extubated, IABP removed  He is off milrinone now. CVC removed.    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  Mild CRP elevation but ESR not high.   Echo: EF 30%, small posterior pericardial effusion, decreased RV systolic function.   No CP or dyspnea. Wt stable.  Hoping to go to CIR soon.   Objective:   Weight Range: 60 kg Body mass index is 20.72 kg/m.   Vital Signs:   Temp:  [97.2 F (36.2 C)-98.1 F (36.7 C)] 98.1 F (36.7 C) (02/05 1152) Pulse Rate:  [63-73] 64 (02/05 1200) Resp:  [14-26] 20 (02/05 1200) BP: (99-126)/(53-89) 100/71 (02/05 1152) SpO2:  [91 %-100 %] 93 % (02/05 1200) Weight:  [60 kg] 60 kg (02/05 0426) Last BM Date: 01/08/20  Weight change: Filed Weights   01/06/20 0608 01/07/20 0500 01/08/20 0426  Weight: 59.2 kg 60.1 kg 60 kg    Intake/Output:   Intake/Output Summary (Last 24 hours) at 01/08/2020 1339 Last data filed at 01/08/2020 0827 Gross per 24 hour  Intake 240.14 ml  Output --  Net 240.14 ml      Physical Exam   PHYSICAL EXAM: General:  Well appearing elderly WM. No respiratory difficulty HEENT: normal Neck: supple. no JVD. Carotids 2+ bilat; no bruits. No lymphadenopathy or thyromegaly appreciated. Cor: PMI nondisplaced. Regular rate & rhythm. Sternal incision intact. ? Slight RUB LLSB Lungs: clear Abdomen: soft, nontender, nondistended. No hepatosplenomegaly. No bruits or masses. Good bowel sounds. Extremities: no cyanosis, clubbing, rash, trace edema, bilateral compression stockings Neuro: alert & oriented x 3, cranial nerves grossly intact. moves all 4 extremities w/o difficulty. Affect pleasant.    Telemetry   NSR 60s  (personally reviewed)  Labs    CBC Recent Labs    01/07/20 0503 01/08/20 0255  WBC 16.2* 16.5*  NEUTROABS 13.0* 12.7*  HGB 11.9* 12.1*  HCT 37.0* 37.7*  MCV 101.6* 102.4*  PLT 129* 129   Basic Metabolic Panel Recent Labs    01/07/20 0503 01/08/20 0255  NA 148* 147*  K 4.0 4.4  CL 113* 111  CO2 27 25  GLUCOSE 117* 129*  BUN 25* 27*  CREATININE 1.27* 1.20  CALCIUM 8.3* 8.6*   Liver Function Tests Recent Labs    01/06/20 0523 01/07/20 0503  AST 225* 102*  ALT 620* 445*  ALKPHOS 68 62  BILITOT 3.8* 3.5*  PROT 5.3* 5.2*  ALBUMIN 2.9* 2.6*   No results for input(s): LIPASE, AMYLASE in the last 72 hours. Cardiac Enzymes No results for input(s): CKTOTAL, CKMB, CKMBINDEX, TROPONINI in the last 72 hours.  BNP: BNP (last 3 results) No results for input(s): BNP in the last 8760 hours.  ProBNP (last 3 results) No results for input(s): PROBNP in the last 8760 hours.   D-Dimer No results for input(s): DDIMER in the last 72 hours. Hemoglobin A1C No results for input(s): HGBA1C in the last 72 hours. Fasting Lipid Panel No results for input(s): CHOL, HDL, LDLCALC, TRIG, CHOLHDL, LDLDIRECT in the last 72 hours. Thyroid Function Tests No results for input(s): TSH, T4TOTAL, T3FREE, THYROIDAB in the last 72 hours.  Invalid input(s): FREET3  Other results:   Imaging  No results found.   Medications:     Scheduled Medications: . sodium chloride   Intravenous Once  . amiodarone  200 mg Oral BID  . aspirin  325 mg Oral Daily  . atorvastatin  80 mg Oral q1800  . bisacodyl  10 mg Oral Daily   Or  . bisacodyl  10 mg Rectal Daily  . bisacodyl  10 mg Rectal Once  . carvedilol  3.125 mg Oral BID WC  . chlorhexidine gluconate (MEDLINE KIT)  15 mL Mouth Rinse BID  . Chlorhexidine Gluconate Cloth  6 each Topical Daily  . colchicine  0.6 mg Oral Daily  . digoxin  0.0625 mg Oral Daily  . docusate sodium  100 mg Oral BID  . enoxaparin (LOVENOX) injection  40 mg  Subcutaneous Q24H  . feeding supplement (ENSURE ENLIVE)  237 mL Oral TID BM  . insulin aspart  0-24 Units Subcutaneous Q4H  . levalbuterol  0.63 mg Nebulization Q6H  . losartan  12.5 mg Oral Daily  . Melatonin  3 mg Oral QHS  . pantoprazole (PROTONIX) IV  40 mg Intravenous Q24H  . polyethylene glycol  17 g Oral Daily  . sodium chloride flush  3 mL Intravenous Q12H  . spironolactone  25 mg Per Tube Daily    Infusions:   PRN Medications: levalbuterol, metoprolol tartrate, ondansetron (ZOFRAN) IV, oxyCODONE, sodium chloride flush   Assessment/Plan   1. CAD: STEMI with very short LM and 99% ostial LAD. PCI thought to risk compromise of ostial large LCx, so decision made to defer PCI and transfer to [facility] for CABG as patient was CP-free at that point. Now s/p CABG with LIMA-LAD, SVG-OM2, SVG-LPDA.   - No angina - Continue ASA.  - Continue atorvastatin 80 mg daily 2. Cardiogenic shock: Pre-op echo with EF 35%.  Now s/p CABG-AVR.  Echo post-op with EF 30%, RV dysfunction. Milrinone discontinued.   - Continue Coreg 3.125 mg bid.  - Continue spironolactone 25 daily.  - Continue digoxin 0.0625 daily.  - Continue losartan 12.5 mg  3. Atrial fibrillation: S/p Maze, LA appendage clip.  NSR today.  - Amiodarone gtt is off, continue amiodarone 200 mg bid.   - Will need eventual anticoagulation, use DVT prophylaxis Lovenox for now.  4. Hyperlipidemia: LDL 187. Goal <70.  - Treat w/ atorvastatin 80 qhs.  - Repeat FLP + HFTs in 6 weeks. If not at goal will need addition of Zetia +/- PCSK9i  5. Thrombocytopenia: Post-op, trending up.  6. Acute hypoxemic respiratory failure: Pulmonary edema.  - Improved w/ diuresis 7. AKI: Creatinine trending down 1.27>>1.20.  8. Delirium: Resolved.  9. ID: WBCs 25 => 19 => 16.6 => 15=>16.3=>16 , afebrile.  On cefepime.  Cultures NGTD.  - Stop at 5 days.  10. Anemia: Post-op, got 1 unit PRBCs 1/31. Hgb remains stable.  11. Dysphagia: Passed swallow study, can  stop free water.   12. Elevated LFTs: ?Shock liver from hypotensive episode immediately post-op. LFTs now trending down.  RUQ US unremarkable.   13. Deconditioning.  Plan transition to CIR 14. Hypernatremia: Resolving, Na 150>>148>>147 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  CRP up but not ESR.  Symptoms resolving.   - Continue colchicine for 2 wks total then stop.   Length of Stay: [address], [place]  01/08/2020, 1:39 PM  Advanced Heart Failure Team Pager [phone] (M-F; 7a - 4p)  Please contact [facility] for night-coverage  after hours (4p -7a ) and weekends on [email or web address]  Patient seen with NP, agree with the above note.   He is doing well today.  1+ ankle edema, JVP around 8, friction rub seems to have resolved.   Now off cefepime.   Can add losartan 12.5 daily today and would start Lasix 20 mg daily.   Continue colchicine for 2 wks then stop.   Will discuss apixaban use with Dr. Orvan Seen given pre-op afib, think he would qualify for apixaban 2.5 mg bid => start apixaban 2.5 mg bid and decrease ASA to 81 daily.   To CIR probably today.   Loralie Champagne 01/08/2020 2:02 PM

## 2020-01-08 NOTE — Care Management Important Message (Signed)
Important Message  Patient Details  Name: Kamen Hostetler MRN: [id number] Date of Birth: 13-Oct-1941   Medicare Important Message Given:  Yes     Shelda Altes 01/08/2020, 11:29 AM

## 2020-01-08 NOTE — Progress Notes (Signed)
Inpatient Rehabilitation-Admissions Coordinator   Pt is agreeable to CIR. I have medical clearance from Dr. Orvan Seen for admit to CIR today. Pt has received insurance benefits letter and signed consent forms. RN and TOC aware of plan for admit today. Pt's daughter aware of plan.   Raechel Ache, OTR/L  Rehab Admissions Coordinator  [phone] 01/08/2020 4:19 PM

## 2020-01-08 NOTE — Progress Notes (Signed)
8 Days Post-Op Procedure(s) (LRB): CORONARY ARTERY BYPASS GRAFTING (CABG) TIMES THREE USING LEFT INTERNAL MAMMARY ARTERY AND LEFT GREATER SAPHENOUS LEG VEIN HARVESTED ENDOSCOPICALLY (N/A) AORTIC VALVE REPLACEMENT (AVR) (N/A) MAZE (N/A) TRANSESOPHAGEAL ECHOCARDIOGRAM (TEE) (N/A) INDOCYANINE GREEN FLUORESCENCE IMAGING (ICG) (N/A) Clipping Of Atrial Appendage (Left) Subjective: Up in the bedside chair, says he feels good. No new issues or concerns. BM this morning. Progressing with mobility.   Objective: Vital signs in last 24 hours: Temp:  [97.2 F (36.2 C)-98.1 F (36.7 C)] 98.1 F (36.7 C) (02/05 1152) Pulse Rate:  [63-73] 64 (02/05 1200) Cardiac Rhythm: Bundle branch block;Normal sinus rhythm (02/05 0700) Resp:  [14-26] 20 (02/05 1200) BP: (99-126)/(53-89) 100/71 (02/05 1152) SpO2:  [91 %-100 %] 93 % (02/05 1200) Weight:  [60 kg] 60 kg (02/05 0426)     Intake/Output from previous day: 02/04 0701 - 02/05 0700 In: 100.1 [IV Piggyback:100.1] Out: 125 [Urine:125] Intake/Output this shift: Total I/O In: 240 [P.O.:240] Out: -   General appearance: alert, cooperative and no distress Neurologic: intact Heart: regular rate and rhythm Lungs: Breath sounds clear Extremities: All well perfused, no peripheral edema. Bruisiing both forearms. Wound: The sternal incision is well approximated with skin staples. Left leg incision dry.   Lab Results: Recent Labs    01/07/20 0503 01/08/20 0255  WBC 16.2* 16.5*  HGB 11.9* 12.1*  HCT 37.0* 37.7*  PLT 129* 156   BMET:  Recent Labs    01/07/20 0503 01/08/20 0255  NA 148* 147*  K 4.0 4.4  CL 113* 111  CO2 27 25  GLUCOSE 117* 129*  BUN 25* 27*  CREATININE 1.27* 1.20  CALCIUM 8.3* 8.6*    PT/INR: No results for input(s): LABPROT, INR in the last 72 hours. ABG    Component Value Date/Time   PHART 7.509 (H) 01/01/2020 1514   HCO3 29.9 (H) 01/01/2020 1514   TCO2 31 01/01/2020 1514   ACIDBASEDEF 5.0 (H) 12/31/2019 2230   O2SAT 57.3 01/07/2020 0503   CBG (last 3)  Recent Labs    01/08/20 0428 01/08/20 0804 01/08/20 1155  GLUCAP 113* 158* 108*    Assessment/Plan: S/P Procedure(s) (LRB): CORONARY ARTERY BYPASS GRAFTING (CABG) TIMES THREE USING LEFT INTERNAL MAMMARY ARTERY AND LEFT GREATER SAPHENOUS LEG VEIN HARVESTED ENDOSCOPICALLY (N/A) AORTIC VALVE REPLACEMENT (AVR) (N/A) MAZE (N/A) TRANSESOPHAGEAL ECHOCARDIOGRAM (TEE) (N/A) INDOCYANINE GREEN FLUORESCENCE IMAGING (ICG) (N/A) Clipping Of Atrial Appendage (Left)  -POD-8 Aortic valve replacement, CABG x 3, bilateral pulmonary vein isolation and left atrial appendage clipping for acute STEMI, severe aortic stenosis, and paroxysmal atrial fibrillation.  VS and cardiac rhythm stable. Making progress with mobility and is motivated to work with the therapists. Accepted to CIR. Plan for transition later today. Will need to monitor for 2 hours after pacer wires are removed. Continue medical mgt as outlined by HF team.   -Acute kidney injury- UO acceptable, creat trending down.  -Elevated PFT's-suspect some degree of shock liver from pre-operative hypoperfusion. Levels are trending toward normal. He is mildly jaundiced.  -History of PAF- remains in SR. Continue amiodarone  -Thrombocytopenia- resolved.  -Hypernatremia- Na+ trending down.    LOS: 8 days    Antony Odea PA=C [id number] 01/08/2020

## 2020-01-08 NOTE — Progress Notes (Signed)
Occupational Therapy Treatment Patient Details Name: David Perez MRN: [id number] DOB: 1941-06-21 Today's Date: 01/08/2020    History of present illness Pt is an 79 y.o. male admitted 12/31/19 with chest pain, s/p emergent cath and transfer to [facility], s/p CABG and AVR on 1/28. Extubated and IABP removed on 1/29. CXR with improved pulmonary edema. PMH includes Hep B.   OT comments  Pt continues to make steady progress towards goals in skilled OT session. Therapy session consisted of ADL grooming and toileting tasks, education in regards to sternal precautions, and ambulating functional household distances. Pt motivated to toilet at beginning of the session, requiring verbal cues to adhere to sternal precautions, and momentum to drive up to standing. Pt able to complete pericare independently with supervision for safety, however required mod A (and reiteration of sternal precautions)  in order to stand from toilet. Pt able to complete grooming tasks of hand washing and washing face standing at the sink with min guard for safety (required no cues for sequencing). Pt able to ambulate household distances (simulated in room walking to bathroom and back) with walker with min guard for safety. Pt continues to make steady progress towards goals, and remains an excellent candidate for CIR in order to regain prior level of function. Will continue to follow acutely to address goals.    Follow Up Recommendations  CIR;Supervision/Assistance - 24 hour    Equipment Recommendations  Other (comment)(TBD at next venue of care)    Recommendations for Other Services      Precautions / Restrictions Precautions Precautions: Sternal;Fall Precaution Booklet Issued: No Precaution Comments: Reviewed sternal precautions Restrictions Weight Bearing Restrictions: Yes Other Position/Activity Restrictions: sternal precautions       Mobility Bed Mobility               General bed mobility comments: Up in chair upon  PT arrival.  Transfers Overall transfer level: Needs assistance Equipment used: Rolling walker (2 wheeled) Transfers: Sit to/from Stand Sit to Stand: Mod assist;Min assist         General transfer comment: Verbal cues provided for hand placement and to adhere to sternal precautions, reiterated use of momentum to stand (especially from low toilet surface), due to low surface, required mod A in order to stand from toilet    Balance Overall balance assessment: Needs assistance Sitting-balance support: Feet supported;No upper extremity supported Sitting balance-Leahy Scale: Good Sitting balance - Comments: supervision   Standing balance support: During functional activity Standing balance-Leahy Scale: Fair Standing balance comment: min gaurd when completing functional ADL tasks                           ADL either performed or assessed with clinical judgement   ADL Overall ADL's : Needs assistance/impaired     Grooming: Wash/dry hands;Min guard;Standing;Wash/dry Lobbyist Transfer: Regular Clinical cytogeneticist;Ambulation;RW;Cueing for safety;Cueing for sequencing;Moderate assistance Toilet Transfer Details (indicate cue type and reason): Min A to descend onto toilet, with cues required to adhere to precautions, due to lowered surface, pt required mod A to stand and further cues to not utilize grab bars Toileting- Water quality scientist and Hygiene: Supervision/safety;Cueing for sequencing;Cueing for safety Toileting - Clothing Manipulation Details (indicate cue type and reason): pt now able to complete pericare safely with sternal precautions     Functional mobility during ADLs: Min guard;Cueing for safety;Cueing for sequencing;Rolling walker  Vision       Perception     Praxis      Cognition Arousal/Alertness: Awake/alert Behavior During Therapy: WFL for tasks assessed/performed Overall Cognitive Status: Within Functional Limits for tasks  assessed                                          Exercises Other Exercises Other Exercises: Serial sit to stands from low recliner chair with Mod A for cues and powering up x5   Shoulder Instructions       General Comments Sp02 remained >88% on RA, more dysnpea noted today.    Pertinent Vitals/ Pain       Pain Assessment: No/denies pain  Home Living                                          Prior Functioning/Environment              Frequency  Min 2X/week        Progress Toward Goals  OT Goals(current goals can now be found in the care plan section)  Progress towards OT goals: Progressing toward goals  Acute Rehab OT Goals Patient Stated Goal: to go home OT Goal Formulation: With patient Time For Goal Achievement: 01/19/20 Potential to Achieve Goals: Good  Plan Discharge plan remains appropriate    Co-evaluation          OT goals addressed during session: ADL's and self-care      AM-PAC OT "6 Clicks" Daily Activity     Outcome Measure   Help from another person eating meals?: None Help from another person taking care of personal grooming?: A Little Help from another person toileting, which includes using toliet, bedpan, or urinal?: A Little Help from another person bathing (including washing, rinsing, drying)?: A Little Help from another person to put on and taking off regular upper body clothing?: A Little Help from another person to put on and taking off regular lower body clothing?: A Little 6 Click Score: 19    End of Session Equipment Utilized During Treatment: Gait belt;Rolling walker  OT Visit Diagnosis: Muscle weakness (generalized) (M62.81);Other abnormalities of gait and mobility (R26.89);Other symptoms and signs involving cognitive function   Activity Tolerance Patient tolerated treatment well   Patient Left in chair;with call bell/phone within reach;with family/visitor present   Nurse  Communication          Time: [id number] OT Time Calculation (min): 30 min  Charges: OT General Charges $OT Visit: 1 Visit OT Treatments $Self Care/Home Management : 23-37 mins  [place]. Thaxton Pelley COTA/L [id number] [place] 01/08/2020, 2:31 PM

## 2020-01-08 NOTE — Progress Notes (Signed)
Came to educate pt however RN notifies me that he is upset right now and refusing CIR. Left materials with RN to give to pt if he does d/c. Left OHS book, Move in the Tube, HF booklet and low sodium diets. Will refer to [place]. [place], ACSM 3:11 PM 01/08/2020

## 2020-01-08 NOTE — Progress Notes (Signed)
Izora Ribas, MD  Physician  Physical Medicine and Rehabilitation  PMR Pre-admission  Signed  Date of Service:  01/05/2020  4:02 PM      Related encounter: Admission (Current) from 12/31/2019 in [facility] 4E [facility] [place]      Signed        PMR Admission Coordinator Pre-Admission Assessment   Patient: David Perez is an 79 y.o., male MRN: [id number] DOB: [birth year] Height: _0  (170.2 cm) Weight: 60 kg   Insurance Information HMO:     PPO:      PCP:      IPA:      80/20: yes     OTHER:  PRIMARY: Medicare Part A and B      Policy#: [id number]      Subscriber: patient CM Name:       Phone#:      Fax#:  Pre-Cert#:       Employer:  Benefits:  Phone #: online     Name: verified eligibility online via [place] on 01/08/20 Eff. Date: Part A and B effective 01/04/2004     Deduct: $1,484      Out of Pocket Max: NA      Life Max: NA CIR: Covered per Medicare guidelines once yearly deductible has been met      SNF: days 1-20, 100%, days 21-100, 80%    Outpatient: 80%     Co-Pay: 20% Home Health: 100%      Co-Pay:  DME: 80%     Co-Pay: 20% Providers: Pt's choice SECONDARY: None      Policy#:       Subscriber:  CM Name:       Phone#:      Fax#:  Pre-Cert#:       Employer:  Benefits:  Phone #:      Name:  Eff. Date:      Deduct:       Out of Pocket Max:       Life Max:  CIR:       SNF:  Outpatient:      Co-Pay:  Home Health:       Co-Pay:  DME:      Co-Pay:    Medicaid Application Date:       Case Manager:  Disability Application Date:       Case Worker:    The "Data Collection Information Summary" for patients in Inpatient Rehabilitation Facilities with attached "Privacy Act [place] Records" was provided and verbally reviewed with: Patient and Family   Emergency Contact Information         Contact Information     Name Relation Home Work Mobile    Staffod,[name] Daughter     [phone]         Current Medical History  Patient Admitting Diagnosis:  Cardiac debility    History of Present Illness: David Perez is an 79 year old male in relatively good health who was originally admitted via [facility] on 12/30/2018 with sudden onset of chest pain due to STEMI.  He underwent cardiac cath emergently revealing aortic stenosis and ostial LAD lesion not felt to be optimal for PCI therefore IAPB placed and he was transferred to [facility] for management.  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 He was evaluated by Dr. Ahmed Prima and taken to the OR emergently for CABG X 3 with AVR and clipping of atrial appendage on  the same day.  Post extubation had hypoxia requiring BiPAP as well as hypotension and A. fib with RVR.  He required Precedex due to delirium with agitation.  Acute hypoxic respiratory failure felt to be due to pulmonary edema and treated with diuresis.   Acute blood loss anemia with thrombocytopenia and leukocytosis of 25,000 as well as abnormal LFTs was monitored with serial checks. Urine culture/blood cultures have been negative.    Mentation has fluctuated due to ongoing delirium and he was kept n.p.o with tube feeds till mentation improved.  Speech therapy following for input and diet advanced to regular.  He developed pleuritic chest pain and found to have a friction rub 2/3. 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 Heart failure following patient to assist with management of fluid overload--milrinone d/c 2/3 with addition of digoxin and amiodarone changed to po route. CHF team following to optimize medication--Losartan added 2/04  with increase in spironolactone to 25 mg daily. Therapy ongoing and CIR recommended due to debility. Pt is to admit to CIR on 01/08/20.      Patient's medical  record from [facility] has been reviewed by the rehabilitation admission coordinator and physician.   Past Medical History      Past Medical History:  Diagnosis Date  . Hepatitis B        Family History   family history is not on file.   Prior Rehab/Hospitalizations Has the patient had prior rehab or hospitalizations prior to admission? No   Has the patient had major surgery during 100 days prior to admission? Yes              Current Medications   Current Facility-Administered Medications:  .  0.9 %  sodium chloride infusion (Manually program via Guardrails IV Fluids), , Intravenous, Once, Atkins, Glenice Bow, MD .  amiodarone (PACERONE) tablet 200 mg, 200 mg, Oral, BID, Orvan Seen, Glenice Bow, MD, 200 mg at 01/08/20 0849 .  apixaban (ELIQUIS) tablet 2.5 mg, 2.5 mg, Oral, Q12H, Larey Dresser, MD .  Derrill Memo ON 01/09/2020] aspirin chewable tablet 81 mg, 81 mg, Oral, Daily, Larey Dresser, MD .  atorvastatin (LIPITOR) tablet 80 mg, 80 mg, Oral, q1800, Wonda Olds, MD, 80 mg at 01/07/20 1719 .  bisacodyl (DULCOLAX) EC tablet 10 mg, 10 mg, Oral, Daily, 10 mg at 01/06/20 0854 **OR** bisacodyl (DULCOLAX) suppository 10 mg, 10 mg, Rectal, Daily, Orvan Seen, Glenice Bow, MD, 10 mg at 01/04/20 1711 .  bisacodyl (DULCOLAX) suppository 10 mg, 10 mg, Rectal, Once, Atkins, Broadus Z, MD .  carvedilol (COREG) tablet 3.125 mg, 3.125 mg, Oral, BID WC, Larey Dresser, MD, 3.125 mg at 01/08/20 0849 .  chlorhexidine gluconate (MEDLINE KIT) (PERIDEX) 0.12 % solution 15 mL, 15 mL, Mouth Rinse, BID, Atkins, Glenice Bow, MD, 15 mL at 01/08/20 0851 .  Chlorhexidine Gluconate Cloth 2 % PADS 6 each, 6 each, Topical, Daily, Orvan Seen, Glenice Bow, MD, 6 each at 01/07/20 0900 .  colchicine tablet 0.6 mg, 0.6 mg, Oral, Daily, Orvan Seen, Glenice Bow, MD, 0.6 mg at 01/08/20 0849 .  digoxin (LANOXIN) tablet 0.0625 mg, 0.0625 mg, Oral, Daily, Orvan Seen, Glenice Bow, MD, 0.0625 mg at 01/08/20 0848 .  docusate sodium  (COLACE) capsule 100 mg, 100 mg, Oral, BID, Atkins, Glenice Bow, MD .  feeding supplement (ENSURE ENLIVE) (ENSURE ENLIVE) liquid  237 mL, 237 mL, Oral, TID BM, Atkins, Glenice Bow, MD, 237 mL at 01/08/20 0852 .  insulin aspart (novoLOG) injection 0-24 Units, 0-24 Units, Subcutaneous, Q4H, Orvan Seen, Glenice Bow, MD, 2 Units at 01/08/20 0855 .  levalbuterol (XOPENEX) nebulizer solution 0.63 mg, 0.63 mg, Nebulization, Q6H PRN, Atkins, Broadus Z, MD .  levalbuterol (XOPENEX) nebulizer solution 0.63 mg, 0.63 mg, Nebulization, Q6H, Atkins, Glenice Bow, MD, 0.63 mg at 01/08/20 1449 .  losartan (COZAAR) tablet 12.5 mg, 12.5 mg, Oral, Daily, Clegg, Amy D, NP, 12.5 mg at 01/08/20 0849 .  Melatonin TABS 3 mg, 3 mg, Oral, QHS, Atkins, Glenice Bow, MD, 3 mg at 01/07/20 2244 .  metoprolol tartrate (LOPRESSOR) injection 2.5-5 mg, 2.5-5 mg, Intravenous, Q2H PRN, Atkins, Broadus Z, MD .  ondansetron (ZOFRAN) injection 4 mg, 4 mg, Intravenous, Q6H PRN, Wonda Olds, MD, 4 mg at 01/01/20 2217 .  oxyCODONE (Oxy IR/ROXICODONE) immediate release tablet 5-10 mg, 5-10 mg, Oral, Q3H PRN, Atkins, Broadus Z, MD .  pantoprazole (PROTONIX) injection 40 mg, 40 mg, Intravenous, Q24H, Atkins, Glenice Bow, MD, 40 mg at 01/07/20 2245 .  polyethylene glycol (MIRALAX / GLYCOLAX) packet 17 g, 17 g, Oral, Daily, Atkins, Broadus Z, MD .  sodium chloride flush (NS) 0.9 % injection 10-40 mL, 10-40 mL, Intracatheter, PRN, Atkins, Broadus Z, MD .  sodium chloride flush (NS) 0.9 % injection 3 mL, 3 mL, Intravenous, Q12H, Atkins, Glenice Bow, MD, 3 mL at 01/08/20 0852 .  spironolactone (ALDACTONE) tablet 25 mg, 25 mg, Per Tube, Daily, Larey Dresser, MD, 25 mg at 01/08/20 0848   Patients Current Diet:  Diet Order                      Diet Heart Room service appropriate? Yes; Fluid consistency: Thin  Diet effective now                   Precautions / Restrictions Precautions Precautions: Sternal, Fall Precaution Booklet Issued:  No Precaution Comments: Reviewed sternal precautions Restrictions Weight Bearing Restrictions: Yes Other Position/Activity Restrictions: sternal precautions    Has the patient had 2 or more falls or a fall with injury in the past year? No   Prior Activity Level Community (5-7x/wk): Independent PTA, no AD use. Pt lived alone, did his own shopping/errands. still drove   Prior Functional Level Self Care: Did the patient need help bathing, dressing, using the toilet or eating? Independent   Indoor Mobility: Did the patient need assistance with walking from room to room (with or without device)? Independent   Stairs: Did the patient need assistance with internal or external stairs (with or without device)? Independent   Functional Cognition: Did the patient need help planning regular tasks such as shopping or remembering to take medications? Independent   Home Assistive Devices / Equipment Home Assistive Devices/Equipment: None Home Equipment: None   Prior Device Use: Indicate devices/aids used by the patient prior to current illness, exacerbation or injury? None of the above   Current Functional Level Cognition   Overall Cognitive Status: Within Functional Limits for tasks assessed Current Attention Level: Selective Orientation Level: Oriented X4 Safety/Judgement: Decreased awareness of deficits, Decreased awareness of safety General Comments: Pt motivated and cooperative. Shocked that it was Tuesday. Knew it was february. Irritated that he cannot eat or drink. Needs cues to adhere to precautions during mobility.    Extremity Assessment (includes Sensation/Coordination)   Upper Extremity Assessment: Generalized weakness(limited due to sternal precautions)  Lower Extremity Assessment: Defer to PT evaluation     ADLs   Overall ADL's : Needs assistance/impaired Eating/Feeding: NPO Grooming: Wash/dry hands, Min guard, Standing, Wash/dry face Upper Body Bathing: Min guard,  Sitting Lower Body Bathing: Maximal assistance, Sit to/from stand Upper Body Dressing : Minimal assistance, Sitting Lower Body Dressing: Maximal assistance, Sit to/from stand Toilet Transfer: Regular Toilet, Ambulation, RW, Cueing for safety, Cueing for sequencing, Moderate assistance Toilet Transfer Details (indicate cue type and reason): Min A to descend onto toilet, with cues required to adhere to precautions, due to lowered surface, pt required mod A to stand and further cues to not utilize grab bars Toileting- Water quality scientist and Hygiene: Supervision/safety, Cueing for sequencing, Cueing for safety Toileting - Clothing Manipulation Details (indicate cue type and reason): pt now able to complete pericare safely with sternal precautions Functional mobility during ADLs: Min guard, Cueing for safety, Cueing for sequencing, Rolling walker     Mobility   Overal bed mobility: Needs Assistance Bed Mobility: Rolling, Sidelying to Sit Rolling: Supervision Sidelying to sit: Min assist Supine to sit: Min assist, HOB elevated Sit to supine: Mod assist General bed mobility comments: Up in chair upon PT arrival.     Transfers   Overall transfer level: Needs assistance Equipment used: Rolling walker (2 wheeled) Transfers: Sit to/from Stand Sit to Stand: Mod assist, Min assist Stand pivot transfers: Min assist, +2 safety/equipment General transfer comment: Verbal cues provided for hand placement and to adhere to sternal precautions, reiterated use of momentum to stand (especially from low toilet surface), due to low surface, required mod A in order to stand from toilet     Ambulation / Gait / Stairs / Wheelchair Mobility   Ambulation/Gait Ambulation/Gait assistance: Counsellor (Feet): 75 Feet Assistive device: Rolling walker (2 wheeled) Gait Pattern/deviations: Step-through pattern, Decreased stride length General Gait Details: Slow, mostly steady gait wtih RW (adjusted  height). 2-3/4 DOE. Reports BLE weakness and fatigue needing to sit. Sp02 remained >88% on RA, difficulty getting accurate pleth reading due to cold fingers. Gait velocity: reduced Gait velocity interpretation: <1.8 ft/sec, indicate of risk for recurrent falls     Posture / Balance Dynamic Sitting Balance Sitting balance - Comments: supervision Balance Overall balance assessment: Needs assistance Sitting-balance support: Feet supported, No upper extremity supported Sitting balance-Leahy Scale: Good Sitting balance - Comments: supervision Standing balance support: During functional activity Standing balance-Leahy Scale: Fair Standing balance comment: min gaurd when completing functional ADL tasks     Special needs/care consideration BiPAP/CPAP: no CPM : no Continuous Drip IV : no Dialysis : no        Days : no Life Vest : no Oxygen : no, on RA Special Bed : no Trach Size : no Wound Vac (area) : no      Location : no Skin: blister to left thigh, skin tear to right arm, chest, buttocks, hip; surgical incision to sternum, left leg incision, distal arm lower posterior right skin tear.                  Bowel mgmt: last BM 01/08/20, continent Bladder mgmt: continent Diabetic mgmt: no Behavioral consideration : no Chemo/radiation : no    Previous Home Environment (from acute therapy documentation) Living Arrangements: Alone Available Help at Discharge: (per PT eval, 2 dtrs live nearby & working on 24hr schedule) Type of Home: House Home Layout: One level Home Access: Level entry Bathroom Shower/Tub: Chiropodist: Standard Home Care Services: No  Discharge Living Setting Plans for Discharge Living Setting: Patient's home, Alone, House Type of Home at Discharge: House Discharge Home Layout: One level Discharge Home Access: Stairs to enter Entrance Stairs-Rails: None Entrance Stairs-Number of Steps: 2 Discharge Bathroom Shower/Tub: Tub/shower unit Discharge  Bathroom Toilet: Standard Discharge Bathroom Accessibility: Yes How Accessible: Accessible via walker Does the patient have any problems obtaining your medications?: No   Social/Family/Support Systems Patient Roles: Other (Comment)(has supportive daughters nearby) Sport and exercise psychologist Information: [name] (daughter): [phone] Anticipated Caregiver: daughters [name] and [place]) Anticipated Caregiver's Contact Information: see above Ability/Limitations of Caregiver: Supervision Caregiver Availability: 24/7 Discharge Plan Discussed with Primary Caregiver: Yes(confirmed with pt and daughter [name]) Is Caregiver In Agreement with Plan?: Yes Does Caregiver/Family have Issues with Lodging/Transportation while Pt is in Rehab?: No   Goals/Additional Needs Patient/Family Goal for Rehab: PT/OT: Mod I/Supervision SLP: NA Expected length of stay: 6-8 days Cultural Considerations: no Dietary Needs: heart healthy, thin liquids.  Equipment Needs: TBD Pt/Family Agrees to Admission and willing to participate: Yes Program Orientation Provided & Reviewed with Pt/Caregiver Including Roles  & Responsibilities: Yes(pt and his daugther [name])  Barriers to Discharge: Home environment access/layout  Barriers to Discharge Comments: steps to enter   Decrease burden of Care through IP rehab admission: NA   Possible need for SNF placement upon discharge: Not anticipated. Pt has good social support at DC from his daughters. Anticipate pt can reach supervision level quickly through CIR and be able to return home safely.    Patient Condition: I have reviewed medical records from [facility], spoken with MD, RN, and patient and daughter. I met with patient at the bedside for inpatient rehabilitation assessment.  Patient will benefit from ongoing PT, OT and SLP, can actively participate in 3 hours of therapy a day 5 days of the week, and can make measurable gains during the admission.  Patient will also benefit  from the coordinated team approach during an Inpatient Acute Rehabilitation admission.  The patient will receive intensive therapy as well as Rehabilitation physician, nursing, social worker, and care management interventions.  Due to safety, skin/wound care, disease management, medication administration, pain management and patient education the patient requires 24 hour a day rehabilitation nursing.  The patient is currently Min/Mod A for transfers and Min G with mobility and Min G to Mod A for basic ADLs.  Discharge setting and therapy post discharge at home with home health is anticipated.  Patient has agreed to participate in the Acute Inpatient Rehabilitation Program and will admit 01/08/20.   Preadmission Screen Completed By:  Raechel Ache, 01/08/2020 4:29 PM ______________________________________________________________________   Discussed status with Dr. Ranell Patrick on 01/08/20 at 4:29PM and received approval for admission today.   Admission Coordinator:  Raechel Ache, OT, time 4:29PM/Date 01/08/20    Assessment/Plan: Diagnosis: Cardiac debility 1. Does the need for close, 24 hr/day Medical supervision in concert with the patient's rehab needs make it unreasonable for this patient to be served in a less intensive setting? Yes 2. Co-Morbidities requiring supervision/potential complications: s/p CABG x3. STEMI involving left main coronary artery, thrombocytopenia, acute kidney injury, elevated PFTs, history of PAF, hypernatremia 3. Due to safety, skin/wound care, disease management, medication administration, pain management and patient education, does the patient require 24 hr/day rehab nursing? Yes 4. Does the patient require coordinated care of a physician, rehab nurse, PT, OT to address physical and functional deficits in the context of the above medical diagnosis(es)? Yes Addressing deficits in the following areas: balance, endurance, locomotion, strength,  transferring, bathing, dressing, feeding,  grooming, toileting and psychosocial support 5. Can the patient actively participate in an intensive therapy program of at least 3 hrs of therapy 5 days a week? Yes 6. The potential for patient to make measurable gains while on inpatient rehab is excellent 7. Anticipated functional outcomes upon discharge from inpatient rehab: modified independent PT, modified independent OT, independent SLP 8. Estimated rehab length of stay to reach the above functional goals is: 10-14 days 9. Anticipated discharge destination: Home 10. Overall Rehab/Functional Prognosis: excellent     MD Signature: Leeroy Cha, MD        Revision History

## 2020-01-08 NOTE — Progress Notes (Signed)
EPW removed per order without difficulty. VSS. Pt educated on 1 hour bed rest. Call light in reach.  Clyde Canterbury, RN

## 2020-01-08 NOTE — H&P (Signed)
Physical Medicine and Rehabilitation Admission H&P    CC: Cardiac Debility   HPI:  David Perez is an 79 year old male in relatively good health who was originally admitted via [facility] on 12/30/2018 with sudden onset of chest pain due to STEMI.  He underwent cardiac cath emergently revealing aortic stenosis and ostial LAD lesion not felt to be optimal for PCI therefore IAPB placed and he was transferred to [facility] for management.  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 He was evaluated by Dr. Ahmed Prima and taken to the OR emergently for CABG X 3 with AVR and clipping of atrial appendage on the same day.  Post extubation had hypoxia requiring BiPAP as well as hypotension and A. fib with RVR.  He required Precedex due to delirium with agitation.  Acute hypoxic respiratory failure felt to be due to pulmonary edema and treated with diuresis--continues to have intermittent tachypnea and requires 2L oxygen per [place].  Acute blood loss anemia with thrombocytopenia and leukocytosis of 25,000 as well as abnormal LFTs was monitored with serial checks. Urine culture/blood cultures have been negative.    Mentation has fluctuated due to ongoing delirium and he was kept n.p.o with tube feeds till mentation improved.  Speech therapy following for input and diet advanced to regular.  He developed pleuritic chest pain and found to have a friction rub 2/3. 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 Heart failure following patient to assist with management of fluid overload--milrinone d/c 2/3 with addition of digoxin and amiodarone changed to po route. CHF team following to optimize medication--Losartan added 2/04  with increase in spironolactone to 25 mg  daily. Therapy ongoing and CIR recommended due to debility.    ROS +chest discomfort, denies pain, constipation. Slept well last night for the first time in a while. Denies SOB. Other ROS negative.    Past Medical History:  Diagnosis Date  . Hepatitis B     Past Surgical History:  Procedure Laterality Date  . AORTIC VALVE REPLACEMENT N/A 12/31/2019   Procedure: AORTIC VALVE REPLACEMENT (AVR);  Surgeon: Wonda Olds, MD;  Location: [place];  Service: Open Heart Surgery;  Laterality: N/A;  . CLIPPING OF ATRIAL APPENDAGE Left 12/31/2019   Procedure: Clipping Of Atrial Appendage;  Surgeon: Wonda Olds, MD;  Location: [place];  Service: Open Heart Surgery;  Laterality: Left;  . CORONARY ARTERY BYPASS GRAFT N/A 12/31/2019   Procedure: CORONARY ARTERY BYPASS GRAFTING (CABG) TIMES THREE USING LEFT INTERNAL MAMMARY ARTERY AND LEFT GREATER SAPHENOUS LEG VEIN HARVESTED ENDOSCOPICALLY;  Surgeon: Wonda Olds, MD;  Location: [place];  Service: Open Heart Surgery;  Laterality: N/A;  POSSIBLE BILATERAL IMA  . CORONARY/GRAFT ACUTE MI REVASCULARIZATION N/A 12/31/2019   Procedure: Coronary/Graft Acute MI Revascularization;  Surgeon: Yolonda Kida, MD;  Location: [place] [facility];  Service: Cardiovascular;  Laterality: N/A;  . LEFT HEART CATH AND CORONARY ANGIOGRAPHY N/A 12/31/2019   Procedure: LEFT HEART CATH AND CORONARY ANGIOGRAPHY;  Surgeon: Yolonda Kida, MD;  Location: [place] [facility];  Service: Cardiovascular;  Laterality: N/A;  . MAZE N/A 12/31/2019   Procedure: MAZE;  Surgeon: Wonda Olds, MD;  Location: [place];  Service: Open Heart Surgery;  Laterality: N/A;  . TEE WITHOUT  CARDIOVERSION N/A 12/31/2019   Procedure: TRANSESOPHAGEAL ECHOCARDIOGRAM (TEE);  Surgeon: Wonda Olds, MD;  Location: [place];  Service: Open Heart Surgery;  Laterality: N/A;    History reviewed. No pertinent family history.    Social History:  reports that he has never smoked. He has never  used smokeless tobacco. He reports previous alcohol use. He reports previous drug use.    Allergies: No Known Allergies    Medications Prior to Admission  Medication Sig Dispense Refill  . amiodarone (PACERONE) 200 MG tablet Take 1 tablet (200 mg total) by mouth 2 (two) times daily.    Marland Kitchen apixaban (ELIQUIS) 2.5 MG TABS tablet Take 1 tablet (2.5 mg total) by mouth every 12 (twelve) hours. 60 tablet   . [START ON 01/09/2020] aspirin 81 MG chewable tablet Chew 1 tablet (81 mg total) by mouth daily.    Marland Kitchen atorvastatin (LIPITOR) 80 MG tablet Take 1 tablet (80 mg total) by mouth daily at 6 PM.    . carvedilol (COREG) 3.125 MG tablet Take 1 tablet (3.125 mg total) by mouth 2 (two) times daily with a meal.    . [START ON 01/09/2020] colchicine 0.6 MG tablet Take 1 tablet (0.6 mg total) by mouth daily. Continue for 2 weeks then stop.    Derrill Memo ON 01/09/2020] digoxin 62.5 MCG TABS Take 0.0625 mg by mouth daily.    . feeding supplement, ENSURE ENLIVE, (ENSURE ENLIVE) LIQD Take 237 mLs by mouth 3 (three) times daily between meals. 237 mL 12  . furosemide (LASIX) 20 MG tablet Take 1 tablet (20 mg total) by mouth daily. 30 tablet 11  . levalbuterol (XOPENEX) 0.63 MG/3ML nebulizer solution Take 3 mLs (0.63 mg total) by nebulization every 6 (six) hours. 3 mL 12  . [START ON 01/09/2020] losartan (COZAAR) 25 MG tablet Take 0.5 tablets (12.5 mg total) by mouth daily.    . Melatonin 3 MG TABS Take 1 tablet (3 mg total) by mouth at bedtime.  0  . oxyCODONE (OXY IR/ROXICODONE) 5 MG immediate release tablet Take 1 tablet (5 mg total) by mouth every 4 (four) hours as needed for up to 5 days for severe pain. 30 tablet 0  . [START ON 01/09/2020] spironolactone (ALDACTONE) 25 MG tablet Place 1 tablet (25 mg total) into feeding tube daily.      Drug Regimen Review  Drug regimen was reviewed and remains appropriate with no significant issues identified  Home: Home Living Family/patient expects to be discharged to:: Private  residence Living Arrangements: Alone Available Help at Discharge: (per PT eval, 2 dtrs live nearby & working on 24hr schedule) Type of Home: House Home Access: Level entry Home Layout: One level Bathroom Shower/Tub: Chiropodist: Standard Home Equipment: None   Functional History: Prior Function Level of Independence: Independent Comments: pt previously indpendent without AD, enjoys walking for exercise. driving independently  Functional Status:  Mobility: Bed Mobility Overal bed mobility: Needs Assistance Bed Mobility: Rolling, Sidelying to Sit Rolling: Supervision Sidelying to sit: Min assist Supine to sit: Min assist, HOB elevated Sit to supine: Mod assist General bed mobility comments: Up in chair upon PT arrival. Transfers Overall transfer level: Needs assistance Equipment used: Rolling walker (2 wheeled) Transfers: Sit to/from Stand Sit to Stand: Mod assist, Min assist Stand pivot transfers: Min assist, +2 safety/equipment General transfer comment: Cues for hand placement, use of momentum and assist to power up as pt able to get half way and then loses balance posteriorly. Stood from chair  x5 with emphasize on eccentric controlled descent into chair and powering up with LEs. Ambulation/Gait Ambulation/Gait assistance: Min guard Gait Distance (Feet): 75 Feet Assistive device: Rolling walker (2 wheeled) Gait Pattern/deviations: Step-through pattern, Decreased stride length General Gait Details: Slow, mostly steady gait wtih RW (adjusted height). 2-3/4 DOE. Reports BLE weakness and fatigue needing to sit. Sp02 remained >88% on RA, difficulty getting accurate pleth reading due to cold fingers. Gait velocity: reduced Gait velocity interpretation: <1.8 ft/sec, indicate of risk for recurrent falls  ADL: ADL Overall ADL's : Needs assistance/impaired Eating/Feeding: NPO Grooming: Wash/dry hands, Min guard, Standing, Wash/dry face Upper Body Bathing: Min  guard, Sitting Lower Body Bathing: Maximal assistance, Sit to/from stand Upper Body Dressing : Minimal assistance, Sitting Lower Body Dressing: Maximal assistance, Sit to/from stand Toilet Transfer: Regular Toilet, Ambulation, RW, Cueing for safety, Cueing for sequencing, Moderate assistance Toilet Transfer Details (indicate cue type and reason): Min A to descend onto toilet, with cues required to adhere to precautions, due to lowered surface, pt required mod A to stand and further cues to not utilize grab bars Toileting- Water quality scientist and Hygiene: Supervision/safety, Cueing for sequencing, Cueing for safety Toileting - Clothing Manipulation Details (indicate cue type and reason): pt now able to complete pericare safely with sternal precautions Functional mobility during ADLs: Min guard, Cueing for safety, Cueing for sequencing, Rolling walker  Cognition: Cognition Overall Cognitive Status: Within Functional Limits for tasks assessed Orientation Level: Oriented X4 Cognition Arousal/Alertness: Awake/alert Behavior During Therapy: WFL for tasks assessed/performed Overall Cognitive Status: Within Functional Limits for tasks assessed Area of Impairment: Memory, Safety/judgement Current Attention Level: Selective Memory: Decreased recall of precautions Safety/Judgement: Decreased awareness of deficits, Decreased awareness of safety Awareness: Intellectual General Comments: Pt motivated and cooperative. Shocked that it was Tuesday. Knew it was february. Irritated that he cannot eat or drink. Needs cues to adhere to precautions during mobility.  Blood pressure 129/65, pulse 63, temperature 97.9 F (36.6 C), resp. rate 17, height '5\' 8"'  (1.727 m), weight 60.9 kg, SpO2 99 %.   Physical Exam  General: Alert and oriented x 3, No apparent distress HEENT: Head is normocephalic, atraumatic, PERRLA, EOMI, sclera anicteric, oral mucosa pink and moist, dentition intact, ext ear canals clear,    Neck: Supple without JVD or lymphadenopathy Heart: Reg rate and rhythm. No murmurs rubs or gallops Chest: CTA bilaterally without wheezes, rales, or rhonchi; no distress Abdomen: Soft, non-tender, non-distended, bowel sounds positive. Extremities: No clubbing, cyanosis, or edema. Pulses are 2+ Skin: Surgical incision on chest clean and intact without signs of breakdown. Some tenderness to palpation over chest wall around incision site.  Neuro: Pt is cognitively appropriate with normal insight, memory, and awareness. Cranial nerves 2-12 are intact. Sensory exam is normal. Reflexes are 2+ in all 4's. Fine motor coordination is intact. No tremors. Motor function is grossly 5/5.  Musculoskeletal: Full ROM, No pain with AROM or PROM in the neck, trunk, or extremities. Posture appropriate Psych: Pt's affect is appropriate. Pt is cooperative  Results for orders placed or performed during the hospital encounter of 12/31/19 (from the past 48 hour(s))  Glucose, capillary     Status: Abnormal   Collection Time: 01/06/20  8:05 PM  Result Value Ref Range   Glucose-Capillary 127 (H) 70 - 99 mg/dL  Glucose, capillary     Status: Abnormal   Collection Time: 01/06/20 11:56 PM  Result Value Ref Range   Glucose-Capillary 104 (H) 70 - 99 mg/dL  Glucose, capillary  Status: Abnormal   Collection Time: 01/07/20  4:07 AM  Result Value Ref Range   Glucose-Capillary 106 (H) 70 - 99 mg/dL  Cooxemetry Panel (carboxy, met, total hgb, O2 sat)     Status: Abnormal   Collection Time: 01/07/20  5:03 AM  Result Value Ref Range   Total hemoglobin 12.2 12.0 - 16.0 g/dL   O2 Saturation 57.3 %   Carboxyhemoglobin 2.4 (H) 0.5 - 1.5 %   Methemoglobin 1.1 0.0 - 1.5 %    Comment: Performed at [facility] [address], [place], [place] [address]  Comprehensive metabolic panel     Status: Abnormal   Collection Time: 01/07/20  5:03 AM  Result Value Ref Range   Sodium 148 (H) 135 - 145 mmol/L   Potassium 4.0 3.5  - 5.1 mmol/L   Chloride 113 (H) 98 - 111 mmol/L   CO2 27 22 - 32 mmol/L   Glucose, Bld 117 (H) 70 - 99 mg/dL   BUN 25 (H) 8 - 23 mg/dL   Creatinine, Ser 1.27 (H) 0.61 - 1.24 mg/dL   Calcium 8.3 (L) 8.9 - 10.3 mg/dL   Total Protein 5.2 (L) 6.5 - 8.1 g/dL   Albumin 2.6 (L) 3.5 - 5.0 g/dL   AST 102 (H) 15 - 41 U/L   ALT 445 (H) 0 - 44 U/L   Alkaline Phosphatase 62 38 - 126 U/L   Total Bilirubin 3.5 (H) 0.3 - 1.2 mg/dL   GFR calc non Af Amer 53 (L) >60 mL/min   GFR calc Af Amer >60 >60 mL/min   Anion gap 8 5 - 15    Comment: Performed at [facility], [place] [address], [place], [place] [address]  CBC with Differential/Platelet     Status: Abnormal   Collection Time: 01/07/20  5:03 AM  Result Value Ref Range   WBC 16.2 (H) 4.0 - 10.5 K/uL   RBC 3.64 (L) 4.22 - 5.81 MIL/uL   Hemoglobin 11.9 (L) 13.0 - 17.0 g/dL   HCT 37.0 (L) 39.0 - 52.0 %   MCV 101.6 (H) 80.0 - 100.0 fL   MCH 32.7 26.0 - 34.0 pg   MCHC 32.2 30.0 - 36.0 g/dL   RDW 18.6 (H) 11.5 - 15.5 %   Platelets 129 (L) 150 - 400 K/uL   nRBC 0.8 (H) 0.0 - 0.2 %   Neutrophils Relative % 81 %   Neutro Abs 13.0 (H) 1.7 - 7.7 K/uL   Lymphocytes Relative 8 %   Lymphs Abs 1.3 0.7 - 4.0 K/uL   Monocytes Relative 6 %   Monocytes Absolute 1.0 0.1 - 1.0 K/uL   Eosinophils Relative 1 %   Eosinophils Absolute 0.2 0.0 - 0.5 K/uL   Basophils Relative 0 %   Basophils Absolute 0.1 0.0 - 0.1 K/uL   Immature Granulocytes 4 %   Abs Immature Granulocytes 0.61 (H) 0.00 - 0.07 K/uL    Comment: Performed at [facility] [address] [place], [place] [address]  Glucose, capillary     Status: Abnormal   Collection Time: 01/07/20  8:00 AM  Result Value Ref Range   Glucose-Capillary 147 (H) 70 - 99 mg/dL  Glucose, capillary     Status: Abnormal   Collection Time: 01/07/20 11:36 AM  Result Value Ref Range   Glucose-Capillary 100 (H) 70 - 99 mg/dL  Glucose, capillary     Status: Abnormal   Collection Time: 01/07/20  3:27 PM  Result  Value Ref Range   Glucose-Capillary 148 (H) 70 - 99 mg/dL  Glucose, capillary     Status: Abnormal   Collection Time: 01/07/20  9:05 PM  Result Value Ref Range   Glucose-Capillary 108 (H) 70 - 99 mg/dL  Glucose, capillary     Status: Abnormal   Collection Time: 01/08/20 12:21 AM  Result Value Ref Range   Glucose-Capillary 138 (H) 70 - 99 mg/dL  CBC with Differential/Platelet     Status: Abnormal   Collection Time: 01/08/20  2:55 AM  Result Value Ref Range   WBC 16.5 (H) 4.0 - 10.5 K/uL   RBC 3.68 (L) 4.22 - 5.81 MIL/uL   Hemoglobin 12.1 (L) 13.0 - 17.0 g/dL   HCT 37.7 (L) 39.0 - 52.0 %   MCV 102.4 (H) 80.0 - 100.0 fL   MCH 32.9 26.0 - 34.0 pg   MCHC 32.1 30.0 - 36.0 g/dL   RDW 18.7 (H) 11.5 - 15.5 %   Platelets 156 150 - 400 K/uL   nRBC 0.4 (H) 0.0 - 0.2 %   Neutrophils Relative % 76 %   Neutro Abs 12.7 (H) 1.7 - 7.7 K/uL   Lymphocytes Relative 9 %   Lymphs Abs 1.4 0.7 - 4.0 K/uL   Monocytes Relative 7 %   Monocytes Absolute 1.1 (H) 0.1 - 1.0 K/uL   Eosinophils Relative 5 %   Eosinophils Absolute 0.7 (H) 0.0 - 0.5 K/uL   Basophils Relative 0 %   Basophils Absolute 0.1 0.0 - 0.1 K/uL   Immature Granulocytes 3 %   Abs Immature Granulocytes 0.52 (H) 0.00 - 0.07 K/uL    Comment: Performed at [facility], [address] [place], [place] [address]  Basic metabolic panel     Status: Abnormal   Collection Time: 01/08/20  2:55 AM  Result Value Ref Range   Sodium 147 (H) 135 - 145 mmol/L   Potassium 4.4 3.5 - 5.1 mmol/L   Chloride 111 98 - 111 mmol/L   CO2 25 22 - 32 mmol/L   Glucose, Bld 129 (H) 70 - 99 mg/dL   BUN 27 (H) 8 - 23 mg/dL   Creatinine, Ser 1.20 0.61 - 1.24 mg/dL   Calcium 8.6 (L) 8.9 - 10.3 mg/dL   GFR calc non Af Amer 57 (L) >60 mL/min   GFR calc Af Amer >60 >60 mL/min   Anion gap 11 5 - 15    Comment: Performed at [facility] [address], [place], [place] [address]  Glucose, capillary     Status: Abnormal   Collection Time: 01/08/20  4:28  AM  Result Value Ref Range   Glucose-Capillary 113 (H) 70 - 99 mg/dL  Glucose, capillary     Status: Abnormal   Collection Time: 01/08/20  8:04 AM  Result Value Ref Range   Glucose-Capillary 158 (H) 70 - 99 mg/dL  Glucose, capillary     Status: Abnormal   Collection Time: 01/08/20 11:55 AM  Result Value Ref Range   Glucose-Capillary 108 (H) 70 - 99 mg/dL  Glucose, capillary     Status: Abnormal   Collection Time: 01/08/20  4:51 PM  Result Value Ref Range   Glucose-Capillary 156 (H) 70 - 99 mg/dL   DG Chest 2 View  Result Date: 01/07/2020 CLINICAL DATA:  Recent coronary artery bypass surgery, prior abnormal chest x-ray EXAM: CHEST - 2 VIEW COMPARISON:  01/05/2020 FINDINGS: Frontal and lateral views of the chest demonstrate interval removal of  the left subclavian catheter and bilateral chest tubes. Epicardial pacing wires remain, a contraindication to MRI. Postsurgical changes from median sternotomy and aortic valve replacement. Cardiac silhouette is enlarged but stable. Overall, improved volume status with decreased pleural effusions, resolution of ground-glass airspace disease, and decreased central vascular congestion. No evidence pneumothorax. Minimal subcutaneous gas within the right lateral chest wall related to previous chest tube. IMPRESSION: 1. Interval removal of left subclavian catheter and bilateral chest tubes. No evidence of pneumothorax. 2. Improved volume status with decreased pleural effusions and decreased central vascular congestion. Electronically Signed   By: Randa Ngo M.D.   On: 01/07/2020 10:38       Medical Problem List and Plan: 1.  Impaired mobility and ADLs secondary to cardiac debility s/p CABGx3.  -patient may shower  -ELOS/Goals: modI in PT, OT, I in SLP  -2 weeks colchicine for pericardial irritation 2.  Antithrombotics: -DVT/anticoagulation: Starting Eliquis today (2/5)   -antiplatelet therapy: On ASA 3. Pain Management: Denies pain, not receiving any  pain medications.  4. Mood: LCSW to follow for evaluations and support.   -antipsychotic agents:  N/A 5. Neuropsych: This patient is capable of making decisions on his own behalf. 6. Skin/Wound Care: Monitor incisions for healing.  7. Fluids/Electrolytes/Nutrition: Offer supplements between meals to promote wound healing.  8. AFib: Continue to monitor HR tid and with increase in activity. Continue Amiodarone bid 9. Persistent Leucocytosis: WBC up to 16,000 +--infectious work up negative s/p IV Cefepime.  10 Acute renal failure with hypernatremia: Need to encourage fluid intake.  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 On ASA, Losartan, digoxin, coreg, Lipitor. 12. Thrombocytopenia: Has resolved.  13. Shortness of breath: Encourage IS. Continue  14. Constipation: Resolved. Can DC daily docusate, miralax, and dulcolax. Patient is refusing and moving bowels regularly.  15. Disposition: Lives alone, but has supportive family, including daughter, nearby.   Reesa Chew, PA-C  I have personally performed a face to face diagnostic evaluation, including, but not limited to relevant history and physical exam findings, of this patient and developed relevant assessment and plan.  Additionally, I have reviewed and concur with the physician assistant's documentation above.  The patient's status has not changed. The original post admission physician evaluation remains appropriate, and any changes from the pre-admission screening or documentation from the acute chart are noted above.   Izora Ribas, MD 01/08/2020

## 2020-01-08 NOTE — Progress Notes (Signed)
Physical Therapy Treatment Patient Details Name: David Perez MRN: [id number] DOB: 1940/12/12 Today's Date: 01/08/2020    History of Present Illness Pt is an 79 y.o. male admitted 12/31/19 with chest pain, s/p emergent cath and transfer to [facility], s/p CABG and AVR on 1/28. Extubated and IABP removed on 1/29. CXR with improved pulmonary edema. PMH includes Hep B.    PT Comments    Patient progressing slowly towards PT goals. Continues to be highly motivated to return to independence. Pt demonstrates difficulty with standing especially from low surfaces requiring mod A due to BLE weakness and posterior LOB. Pt has more difficulty ambulating without use of EVA due to LE weakness. Noted to have 2-3/4 DOE and needed seated rest break after 75'. Fatigues with mobility but pushes through as able. SP02 remained >88% on RA. Worked on LE strengthening re: serial sit to stands. Needs reminders to adhere to sternal precautions during all mobility. Pt eager to get to rehab. Will continue to follow.    Follow Up Recommendations  CIR;Supervision/Assistance - 24 hour     Equipment Recommendations  Rolling walker with 5" wheels;3in1 (PT)    Recommendations for Other Services       Precautions / Restrictions Precautions Precautions: Sternal;Fall Precaution Comments: Reviewed sternal precautions Restrictions Weight Bearing Restrictions: Yes Other Position/Activity Restrictions: sternal precautions    Mobility  Bed Mobility               General bed mobility comments: Up in chair upon PT arrival.  Transfers Overall transfer level: Needs assistance Equipment used: Rolling walker (2 wheeled) Transfers: Sit to/from Stand Sit to Stand: Mod assist;Min assist         General transfer comment: Cues for hand placement, use of momentum and assist to power up as pt able to get half way and then loses balance posteriorly. Stood from chair x5 with emphasize on eccentric controlled descent into chair and  powering up with LEs.  Ambulation/Gait Ambulation/Gait assistance: Min guard Gait Distance (Feet): 75 Feet Assistive device: Rolling walker (2 wheeled) Gait Pattern/deviations: Step-through pattern;Decreased stride length Gait velocity: reduced   General Gait Details: Slow, mostly steady gait wtih RW (adjusted height). 2-3/4 DOE. Reports BLE weakness and fatigue needing to sit. Sp02 remained >88% on RA, difficulty getting accurate pleth reading due to cold fingers.   Stairs             Wheelchair Mobility    Modified Rankin (Stroke Patients Only)       Balance Overall balance assessment: Needs assistance Sitting-balance support: Feet supported;No upper extremity supported Sitting balance-Leahy Scale: Good Sitting balance - Comments: supervision   Standing balance support: During functional activity Standing balance-Leahy Scale: Fair Standing balance comment: minG with BUE support                            Cognition Arousal/Alertness: Awake/alert Behavior During Therapy: WFL for tasks assessed/performed Overall Cognitive Status: Within Functional Limits for tasks assessed                                        Exercises Other Exercises Other Exercises: Serial sit to stands from low recliner chair with Mod A for cues and powering up x5    General Comments General comments (skin integrity, edema, etc.): Sp02 remained >88% on RA, more dysnpea noted today.  Pertinent Vitals/Pain Pain Assessment: No/denies pain    Home Living                      Prior Function            PT Goals (current goals can now be found in the care plan section) Progress towards PT goals: Progressing toward goals    Frequency    Min 3X/week      PT Plan Current plan remains appropriate    Co-evaluation              AM-PAC PT "6 Clicks" Mobility   Outcome Measure  Help needed turning from your back to your side while in a  flat bed without using bedrails?: A Little Help needed moving from lying on your back to sitting on the side of a flat bed without using bedrails?: A Little Help needed moving to and from a bed to a chair (including a wheelchair)?: A Lot Help needed standing up from a chair using your arms (e.g., wheelchair or bedside chair)?: A Lot Help needed to walk in hospital room?: A Little Help needed climbing 3-5 steps with a railing? : A Lot 6 Click Score: 15    End of Session Equipment Utilized During Treatment: Gait belt Activity Tolerance: Patient tolerated treatment well;Patient limited by fatigue Patient left: in chair;with call bell/phone within reach Nurse Communication: Mobility status PT Visit Diagnosis: Difficulty in walking, not elsewhere classified (R26.2);Muscle weakness (generalized) (M62.81)     Time: [id number] PT Time Calculation (min) (ACUTE ONLY): 38 min  Charges:  $Gait Training: 8-22 mins $Therapeutic Activity: 23-37 mins                     Marisa Severin, PT, DPT Acute Rehabilitation Services Pager [phone] Office [place] 01/08/2020, 12:56 PM

## 2020-01-09 ENCOUNTER — Inpatient Hospital Stay (HOSPITAL_COMMUNITY): Payer: Medicare Other | Admitting: Occupational Therapy

## 2020-01-09 ENCOUNTER — Inpatient Hospital Stay (HOSPITAL_COMMUNITY): Payer: Medicare Other | Admitting: Physical Therapy

## 2020-01-09 DIAGNOSIS — R5381 Other malaise: Principal | ICD-10-CM

## 2020-01-09 LAB — CBC WITH DIFFERENTIAL/PLATELET
Abs Immature Granulocytes: 0.27 10*3/uL — ABNORMAL HIGH (ref 0.00–0.07)
Basophils Absolute: 0 10*3/uL (ref 0.0–0.1)
Basophils Relative: 0 %
Eosinophils Absolute: 0.4 10*3/uL (ref 0.0–0.5)
Eosinophils Relative: 3 %
HCT: 34.6 % — ABNORMAL LOW (ref 39.0–52.0)
Hemoglobin: 11.2 g/dL — ABNORMAL LOW (ref 13.0–17.0)
Immature Granulocytes: 2 %
Lymphocytes Relative: 8 %
Lymphs Abs: 1.1 10*3/uL (ref 0.7–4.0)
MCH: 33 pg (ref 26.0–34.0)
MCHC: 32.4 g/dL (ref 30.0–36.0)
MCV: 102.1 fL — ABNORMAL HIGH (ref 80.0–100.0)
Monocytes Absolute: 1.3 10*3/uL — ABNORMAL HIGH (ref 0.1–1.0)
Monocytes Relative: 9 %
Neutro Abs: 11 10*3/uL — ABNORMAL HIGH (ref 1.7–7.7)
Neutrophils Relative %: 78 %
Platelets: 215 10*3/uL (ref 150–400)
RBC: 3.39 MIL/uL — ABNORMAL LOW (ref 4.22–5.81)
RDW: 18.3 % — ABNORMAL HIGH (ref 11.5–15.5)
WBC: 14 10*3/uL — ABNORMAL HIGH (ref 4.0–10.5)
nRBC: 0.1 % (ref 0.0–0.2)

## 2020-01-09 LAB — COMPREHENSIVE METABOLIC PANEL
ALT: 210 U/L — ABNORMAL HIGH (ref 0–44)
AST: 74 U/L — ABNORMAL HIGH (ref 15–41)
Albumin: 2.5 g/dL — ABNORMAL LOW (ref 3.5–5.0)
Alkaline Phosphatase: 69 U/L (ref 38–126)
Anion gap: 10 (ref 5–15)
BUN: 23 mg/dL (ref 8–23)
CO2: 19 mmol/L — ABNORMAL LOW (ref 22–32)
Calcium: 8.4 mg/dL — ABNORMAL LOW (ref 8.9–10.3)
Chloride: 116 mmol/L — ABNORMAL HIGH (ref 98–111)
Creatinine, Ser: 1.23 mg/dL (ref 0.61–1.24)
GFR calc Af Amer: >60 mL/min (ref >60)
GFR calc non Af Amer: 55 mL/min — ABNORMAL LOW (ref >60)
Glucose, Bld: 146 mg/dL — ABNORMAL HIGH (ref 70–99)
Potassium: 3.9 mmol/L (ref 3.5–5.1)
Sodium: 145 mmol/L (ref 135–145)
Total Bilirubin: 2.4 mg/dL — ABNORMAL HIGH (ref 0.3–1.2)
Total Protein: 5.1 g/dL — ABNORMAL LOW (ref 6.5–8.1)

## 2020-01-09 MED ORDER — CALCIUM POLYCARBOPHIL 625 MG PO TABS
625.0000 mg | ORAL_TABLET | Freq: Every day | ORAL | Status: DC
  Administered 2020-01-09 – 2020-01-14 (×6): 625 mg via ORAL
  Filled 2020-01-09 (×6): qty 1

## 2020-01-09 MED ORDER — LEVALBUTEROL HCL 0.63 MG/3ML IN NEBU
0.6300 mg | INHALATION_SOLUTION | Freq: Two times a day (BID) | RESPIRATORY_TRACT | Status: DC
  Administered 2020-01-09: 22:00:00 0.63 mg via RESPIRATORY_TRACT
  Filled 2020-01-09 (×2): qty 3

## 2020-01-09 NOTE — Progress Notes (Signed)
[place] PHYSICAL MEDICINE & REHABILITATION PROGRESS NOTE   Subjective/Complaints:  Pt reports having noncardiac L chest pain from catheter that was removed from chest- is reproducible by pressing on L chest; like musculoskeletal when asked- comes and goes. Also has gotten better every time has it.  Currently on toilet having BM- having a LOT of loose stools- asking for help with this.    ROS: pt denies cardiac CP, SOB, abd pain, HA, vision changes, mood changes, N/V/C however does c/o loose stools due to colchicine  Objective:   No results found. Recent Labs    01/08/20 0255 01/09/20 1205  WBC 16.5* 14.0*  HGB 12.1* 11.2*  HCT 37.7* 34.6*  PLT 156 215   Recent Labs    01/08/20 0255 01/09/20 1205  NA 147* 145  K 4.4 3.9  CL 111 116*  CO2 25 19*  GLUCOSE 129* 146*  BUN 27* 23  CREATININE 1.20 1.23  CALCIUM 8.6* 8.4*    Intake/Output Summary (Last 24 hours) at 01/09/2020 1304 Last data filed at 01/08/2020 2300 Gross per 24 hour  Intake 240 ml  Output -  Net 240 ml     Physical Exam: Vital Signs Blood pressure (!) 141/72, pulse 72, temperature 98.5 F (36.9 C), resp. rate 19, height 5\' 8"  (1.727 m), weight 58.7 kg, SpO2 96 %.  Physical Exam  General: Alert and oriented x 3, No apparent distress Sitting up on toilet in bathroom; with OT at side; appropriate, wearing gown, NAD HEENT: conjugate gaze; trachea midline Neck: Supple with no JVD Heart: Reg rate and rhythm. Chest: CTA bilaterally without wheezes, rales, or rhonchi; no distress Abdomen: Soft, non-tender, non-distended, (+) BS Extremities: No clubbing, cyanosis, or edema. Pulses are 2+ Skin: Surgical incision on chest clean and intact without signs of breakdown. Some tenderness to palpation over chest wall around incision site- site of pt's noncardiac chest pain.  Neuro: Pt is cognitively appropriate with normal insight, memory, and awareness. Cranial nerves 2-12 are intact. Sensory exam is normal.  Reflexes are 2+ in all 4's. Fine motor coordination is intact. No tremors. Motor function is grossly 5/5.  Musculoskeletal: Full ROM, No pain with AROM or PROM in the neck, trunk, or extremities. Posture appropriate Psych: Pt's affect is appropriate. Pt is cooperative   Assessment/Plan: 1. 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  Physiatrist is providing close team supervision and 24 hour management of active medical problems listed below.  Physiatrist and rehab team continue to assess barriers to discharge/monitor patient progress toward functional and medical goals  Care Tool:  Bathing    Body parts bathed by patient: Right arm, Left arm, Front perineal area, Buttocks, Right upper leg, Left upper leg, Face   Body parts bathed by helper: Chest, Abdomen, Right lower leg, Left lower leg     Bathing assist Assist Level: Moderate Assistance - Patient 50 - 74%     Upper Body Dressing/Undressing Upper body dressing   What is the patient wearing?: Button up shirt    Upper body assist Assist Level: Moderate Assistance - Patient 50 - 74%    Lower Body Dressing/Undressing Lower body dressing      What is the patient wearing?: Pants, Underwear/pull up     Lower body assist Assist for lower body dressing: Moderate Assistance - Patient 50 - 74%     Toileting Toileting    Toileting assist  Assist for toileting: Moderate Assistance - Patient 50 - 74%     Transfers Chair/bed transfer  Transfers assist     Chair/bed transfer assist level: Minimal Assistance - Patient > 75%     Locomotion Ambulation   Ambulation assist              Walk 10 feet activity   Assist           Walk 50 feet activity   Assist           Walk 150 feet activity   Assist           Walk 10 feet on uneven surface  activity   Assist            Wheelchair     Assist               Wheelchair 50 feet with 2 turns activity    Assist            Wheelchair 150 feet activity     Assist          Blood pressure (!) 141/72, pulse 72, temperature 98.5 F (36.9 C), resp. rate 19, height 5\' 8"  (1.727 m), weight 58.7 kg, SpO2 96 %.  Medical Problem List and Plan: 1.  Impaired mobility and ADLs secondary to cardiac debility s/p CABGx3.             -patient may shower             -ELOS/Goals: modI in PT, OT, I in SLP             -2 weeks colchicine for pericardial irritation 2.  Antithrombotics: -DVT/anticoagulation: Starting Eliquis today (2/5)              -antiplatelet therapy: On ASA 3. Pain Management: Denies pain, not receiving any pain medications.  4. Mood: LCSW to follow for evaluations and support.              -antipsychotic agents:  N/A 5. Neuropsych: This patient is capable of making decisions on his own behalf. 6. Skin/Wound Care: Monitor incisions for healing.  7. Fluids/Electrolytes/Nutrition: Offer supplements between meals to promote wound healing.  8. AFib: Continue to monitor HR tid and with increase in activity. Continue Amiodarone bid 9. Persistent Leucocytosis: WBC up to 16,000 +--infectious work up negative s/p IV Cefepime.   2/6- WBC down to 14k- febrile since admission- will con't to monitor 10 Acute renal failure with hypernatremia: Need to encourage fluid intake.   2.6- Cr down to 1.23 and BUN down to 23- recovering- Na 145- so improving- con't regimen 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 On ASA, Losartan, digoxin, coreg, Lipitor.   Filed Weights   01/08/20 1838 01/09/20 0456  Weight: 60.9 kg 58.7 kg    2/6- Weight down 2kg in 1 day- will montor for trend 12. Thrombocytopenia: Has resolved.  13. Shortness of breath: Encourage IS. Continue  14. Constipation: Resolved. Can DC daily docusate, miralax, and dulcolax. Patient is refusing  and moving bowels regularly.   2/6- having loose stools- due to colchicine likely- will d/c Colace and add Fibercon daily 15. Disposition: Lives alone, but has supportive family, including daughter, nearby.      LOS: 1 days A FACE TO FACE EVALUATION WAS PERFORMED  Tedra Coppernoll 01/09/2020, 1:04 PM

## 2020-01-09 NOTE — Progress Notes (Signed)
During bedside shift report, patient reports having "sharp"  Chest, ? Incisional pain to left upper chest, between TV:[id number]. Encouraged patient to report pain when pain is occurring. Reports feeling fine now. Oncoming nurse made aware. Patrici Ranks A

## 2020-01-09 NOTE — Evaluation (Signed)
Physical Therapy Assessment and Plan  Patient Details  Name: David Perez MRN: [id number] Date of Birth: [birth year]  PT Diagnosis: Abnormal posture, Abnormality of gait, Difficulty walking, Edema and Muscle weakness Rehab Potential: Excellent ELOS: 10-14 days   Today's Date: 01/09/2020 PT Individual Time: 1403-1520 PT Individual Time Calculation (min): 77 min    Problem List:  Patient Active Problem List   Diagnosis Date Noted  . Debility 01/08/2020  . STEMI involving left main coronary artery ([place]) 12/31/2019  . STEMI (ST elevation myocardial infarction) ([place]) 12/31/2019  . 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 Hepatitis B    Past Surgical History:  Past Surgical History:  Procedure Laterality Date  . AORTIC VALVE REPLACEMENT N/A 12/31/2019   Procedure: AORTIC VALVE REPLACEMENT (AVR);  Surgeon: Wonda Olds, MD;  Location: [facility];  Service: Open Heart Surgery;  Laterality: N/A;  . CLIPPING OF ATRIAL APPENDAGE Left 12/31/2019   Procedure: Clipping Of Atrial Appendage;  Surgeon: Wonda Olds, MD;  Location: [facility];  Service: Open Heart Surgery;  Laterality: Left;  . CORONARY ARTERY BYPASS GRAFT N/A 12/31/2019   Procedure: CORONARY ARTERY BYPASS GRAFTING (CABG) TIMES THREE USING LEFT INTERNAL MAMMARY ARTERY AND LEFT GREATER SAPHENOUS LEG VEIN HARVESTED ENDOSCOPICALLY;  Surgeon: Wonda Olds, MD;  Location: [facility];  Service: Open Heart Surgery;  Laterality: N/A;  POSSIBLE BILATERAL IMA  . CORONARY/GRAFT ACUTE MI REVASCULARIZATION N/A 12/31/2019   Procedure: Coronary/Graft Acute MI Revascularization;  Surgeon: Yolonda Kida, MD;  Location: [facility];  Service: Cardiovascular;  Laterality: N/A;  . LEFT HEART CATH AND CORONARY ANGIOGRAPHY N/A 12/31/2019   Procedure: LEFT HEART CATH AND CORONARY ANGIOGRAPHY;  Surgeon: Yolonda Kida, MD;  Location: [facility];  Service: Cardiovascular;  Laterality:  N/A;  . MAZE N/A 12/31/2019   Procedure: MAZE;  Surgeon: Wonda Olds, MD;  Location: [facility];  Service: Open Heart Surgery;  Laterality: N/A;  . TEE WITHOUT CARDIOVERSION N/A 12/31/2019   Procedure: TRANSESOPHAGEAL ECHOCARDIOGRAM (TEE);  Surgeon: Wonda Olds, MD;  Location: [facility];  Service: Open Heart Surgery;  Laterality: N/A;    Assessment & Plan Clinical Impression: Patient is a 79 y.o. year old male  in relatively good health who was originally admitted via [facility] on 12/30/2018 with sudden onset of chest pain due to STEMI.  He underwent cardiac cath emergently revealing aortic stenosis and ostial LAD lesion not felt to be optimal for PCI therefore IAPB placed and he was transferred to [facility] for management.  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 He was evaluated by Dr. Ahmed Prima and taken to the OR emergently for CABG X 3 with AVR and clipping of atrial appendage on the same day.  Post extubation had hypoxia requiring BiPAP as well as hypotension and A. fib with RVR.  He required Precedex due to delirium with agitation.  Acute hypoxic respiratory failure felt to be due to pulmonary edema and treated with diuresis--continues to have intermittent tachypnea and requires 2L oxygen per [place].  Acute blood loss anemia with thrombocytopenia and leukocytosis of 25,000 as well as abnormal LFTs was monitored with serial checks. Urine culture/blood cultures have been negative.    Mentation has fluctuated due to ongoing delirium and he was kept n.p.o with tube feeds till mentation improved.  Speech therapy following for input and  diet advanced to regular.  He developed pleuritic chest pain and found to have a friction rub 2/3. 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 Heart failure following patient to assist with management of fluid overload--milrinone d/c 2/3 with addition of digoxin and amiodarone changed to po route. CHF team following to optimize medication--Losartan added 2/04  with increase in spironolactone to 25 mg daily. Therapy ongoing and CIR recommended due to debility. Patient transferred to CIR on 01/08/2020 .   Patient currently requires min with mobility secondary to muscle weakness, decreased cardiorespiratoy endurance and decreased standing balance, decreased balance strategies and difficulty maintaining precautions.  Prior to hospitalization, patient was independent  with mobility and lived with Alone in a House home.  Home access is 2 stepsStairs to enter.  Patient will benefit from skilled PT intervention to maximize safe functional mobility, minimize fall risk and decrease caregiver burden for planned discharge home with 24 hour supervision.  Anticipate patient will benefit from follow up [place] at discharge.  PT - End of Session Activity Tolerance: Tolerates 30+ min activity with multiple rests Endurance Deficit: Yes Endurance Deficit Description: requires frequent rest breaks secondary to fatigue and SOB PT Assessment Rehab Potential (ACUTE/IP ONLY): Excellent PT Barriers to Discharge: [place] home environment;Decreased caregiver support;Home environment access/layout;Medical stability PT Patient demonstrates impairments in the following area(s): Balance;Safety;Edema;Endurance;Motor;Nutrition;Pain;Skin Integrity PT Transfers Functional Problem(s): Bed Mobility;Bed to Chair;Car;Furniture PT Locomotion Functional Problem(s): Ambulation;Stairs PT Plan PT Intensity: Minimum of 1-2 x/day ,45 to 90 minutes PT Frequency: 5 out of 7 days PT Duration Estimated Length of Stay: 10-14 days PT Treatment/Interventions: Ambulation/gait training;Community reintegration;DME/adaptive equipment instruction;Neuromuscular  re-education;Psychosocial support;Stair training;UE/LE Strength taining/ROM;Balance/vestibular training;Discharge planning;Pain management;Skin care/wound management;Therapeutic Activities;UE/LE Coordination activities;Disease management/prevention;Functional mobility training;Patient/family education;Splinting/orthotics;Therapeutic Exercise PT Transfers Anticipated Outcome(s): supervision PT Locomotion Anticipated Outcome(s): supervision using LRAD PT Recommendation Follow Up Recommendations: Home health PT;24 hour supervision/assistance Patient destination: Home Equipment Recommended: To be determined  Skilled Therapeutic Intervention Evaluation completed (see details above and below) with education on PT POC and goals and individual treatment initiated with focus on activity tolerance, bed mobility, transfers, gait, and stair navigation as well as education regarding daily therapy schedule, weekly team meetings, purpose of PT evaluation, and other CIR information. Pt received supine in bed and eager to participate with therapy. Pt able to recall sternal precautions with education on application to mobility - required repeated cuing throughout session to maintain during mobility. Received and maintained on room air throughout session with SpO2 maintained >95%. Supine>sitting EOB, HOB flat and not using bedrail, with supervision. Sit<>stands throughout session, no AD, with CGA for steadying. Stand pivot to w/c, no AD, with min assist for balance.  Transported to/from gym in w/c for energy conservation. Gait training 46f x2, seated break between because pt becomes SOB, and min assist for balance - demonstrates slow gait speed with decreased B LE step length though symmetrical. Ascended/descended 4 steps x2 (seated break between again due to SOB) using L HR (to replicate a "pole" he grabs on for stability at home) with varying reciprocal and step-to pattern with min assist for balance. Ambulatory simulated  car transfer (sedan height), no AD, with min assist for steadying balance and coming to stand from low seat height - demonstrates increased SOB due to effort with coming to stand. Ambulated ~186fup/down ramp, no UE support, with min assist for  balance and pt demonstrating increased unsteadiness and even more decreased B LE step length. Participated in 6MWT with pt ambulating 41f then having seated rest break and requiring the remaining 563mutes to recover despite SpO2 97%. Participated in TUG with 20second time - educated on results. Transported back to room and pt left seated in w/c with needs in reach, seat belt alarm on, and education on calling for assistance with all mobility.   PT Evaluation Precautions/Restrictions Precautions Precautions: Sternal;Fall Precaution Comments: Reviewed sternal precautions Pain Pain Assessment Pain Scale: 0-10 Pain Score: 0-No pain Home Living/Prior Functioning Home Living Available Help at Discharge: Available PRN/intermittently;Family(2 daughters live nearby) Type of Home: House Home Access: Stairs to enter [employer] Steps: 2 steps Entrance Stairs-Rails: None Home Layout: One level  Lives With: Alone Prior Function Level of Independence: Independent with gait;Independent with transfers;Independent with homemaking with ambulation  Able to Take Stairs?: Yes Driving: Yes Comments: pt previously indpendent without AD, enjoys walking for exercise. driving independently Perception  Perception Perception: Within Functional Limits Praxis Praxis: Intact  Cognition Overall Cognitive Status: Within Functional Limits for tasks assessed Arousal/Alertness: Awake/alert Orientation Level: Oriented X4 Attention: Focused;Sustained Focused Attention: Appears intact Awareness: Appears intact Safety/Judgment: Appears intact Sensation Sensation Light Touch: Appears Intact Hot/Cold: Not tested Proprioception: Appears Intact Stereognosis: Not  tested Coordination Gross Motor Movements are Fluid and Coordinated: Yes Motor  Motor Motor: Other (comment) Motor - Skilled Clinical Observations: functional weakness and significant endurance impairment  Mobility Bed Mobility Bed Mobility: Supine to Sit Supine to Sit: Supervision/Verbal cueing Transfers Transfers: Sit to Stand;Stand to Sit;Stand Pivot Transfers Sit to Stand: Contact Guard/Touching assist Stand to Sit: Contact Guard/Touching assist Stand Pivot Transfers: Minimal Assistance - Patient > 75% Stand Pivot Transfer Details: Tactile cues for weight shifting;Verbal cues for technique;Verbal cues for precautions/safety;Verbal cues for gait pattern;Verbal cues for sequencing Transfer (Assistive device): None Locomotion  Gait Ambulation: Yes Gait Assistance: Minimal Assistance - Patient > 75% Gait Distance (Feet): 60 Feet Assistive device: None Gait Assistance Details: Verbal cues for precautions/safety;Verbal cues for technique;Verbal cues for gait pattern;Verbal cues for sequencing;Tactile cues for sequencing;Tactile cues for weight shifting Gait Gait: Yes Gait Pattern: Step-through pattern;Decreased step length - right;Decreased step length - left;Poor foot clearance - left;Poor foot clearance - right Gait velocity: decreased Stairs / Additional Locomotion Stairs: Yes Stairs Assistance: Minimal Assistance - Patient > 75% Stair Management Technique: One rail Left Number of Stairs: 8 Height of Stairs: 6 Ramp: Minimal Assistance - Patient >75% Wheelchair Mobility Wheelchair Mobility: No  Trunk/Postural Assessment  Cervical Assessment Cervical Assessment: Exceptions to WFL(forward head) Thoracic Assessment Thoracic Assessment: Exceptions to WFL(rounded shoulders) Lumbar Assessment Lumbar Assessment: Exceptions to WFL(posterior pelvic tilt) Postural Control Postural Control: Deficits on evaluation Postural Limitations: decreased  Balance Balance Balance  Assessed: Yes Static Sitting Balance Static Sitting - Balance Support: Feet supported Static Sitting - Level of Assistance: 5: Stand by assistance Dynamic Sitting Balance Dynamic Sitting - Balance Support: Feet supported Dynamic Sitting - Level of Assistance: 5: Stand by assistance Static Standing Balance Static Standing - Balance Support: During functional activity;No upper extremity supported Static Standing - Level of Assistance: 4: Min assist Dynamic Standing Balance Dynamic Standing - Balance Support: During functional activity Dynamic Standing - Level of Assistance: 3: Mod assist;4: Min assist Extremity Assessment      RLE Assessment RLE Assessment: Within Functional Limits Active Range of Motion (AROM) Comments: WFL General Strength Comments: Grossly 4+/5 to 5/5 throughout assessed in supine LLE Assessment LLE Assessment: Within Functional  Limits Active Range of Motion (AROM) Comments: WFL General Strength Comments: Grossly 4+/5 to 5/5 throughout assessed in supine    Refer to Care Plan for Long Term Goals  Recommendations for other services: None   Discharge Criteria: Patient will be discharged from PT if patient refuses treatment 3 consecutive times without medical reason, if treatment goals not met, if there is a change in medical status, if patient makes no progress towards goals or if patient is discharged from hospital.  The above assessment, treatment plan, treatment alternatives and goals were discussed and mutually agreed upon: by patient  Tawana Scale, PT, DPT 01/09/2020, 12:10 PM

## 2020-01-09 NOTE — Progress Notes (Signed)
+/-   sleep. Scheduled melatonin given. Left upper chest with dressing CD&I, old central line site. Sternal incision OTA. Old chest tube sites with sutures intact. Bilateral heels red, elevated off bed with pillows. Liquid stools-scheduled colace held. Patient reports loose stools since admission. Up to [facility]. SOB after returning to bed, recovered within< 20mins. After returning to bed.  BLE with +1 pitting edema, mainly pedal edema. Patrici Ranks A

## 2020-01-09 NOTE — Plan of Care (Signed)
  Problem: Consults Goal: RH GENERAL PATIENT EDUCATION Description: See Patient Education module for education specifics. Outcome: Progressing Goal: Skin Care Protocol Initiated - if Braden Score 18 or less Description: If consults are not indicated, leave blank or document N/A Outcome: Progressing   Problem: RH SKIN INTEGRITY Goal: RH STG MAINTAIN SKIN INTEGRITY WITH ASSISTANCE Description: STG Maintain Skin Integrity With min Assistance. Outcome: Progressing Goal: RH STG ABLE TO PERFORM INCISION/WOUND CARE W/ASSISTANCE Description: STG Able To Perform Incision/Wound Care With min Assistance. Outcome: Progressing   Problem: RH SAFETY Goal: RH STG ADHERE TO SAFETY PRECAUTIONS W/ASSISTANCE/DEVICE Description: STG Adhere to Safety Precautions With min Assistance and appropriate assistive Device. Outcome: Progressing   Problem: RH PAIN MANAGEMENT Goal: RH STG PAIN MANAGED AT OR BELOW PT'S PAIN GOAL Description: <3 on a 0-10 pain scale Outcome: Progressing   Problem: RH KNOWLEDGE DEFICIT GENERAL Goal: RH STG INCREASE KNOWLEDGE OF SELF CARE AFTER HOSPITALIZATION Description: Patient will demonstrate knowledge of medication management, incision site care, and follow up care with the MD post discharge with min assist from rehab staff. Outcome: Progressing

## 2020-01-09 NOTE — Evaluation (Signed)
Occupational Therapy Assessment and Plan  Patient Details  Name: David Perez MRN: [id number] Date of Birth: [birth year]  OT Diagnosis: abnormal posture and muscle weakness (generalized) Rehab Potential: Rehab Potential (ACUTE ONLY): Good ELOS: 10-14 days   Today's Date: 01/09/2020 OT Individual Time: 8563-1497 and 1100-1139 OT Individual Time Calculation (min): 90 min   And 39 minutes  Problem List:  Patient Active Problem List   Diagnosis Date Noted  . Debility 01/08/2020  . STEMI involving left main coronary artery ([place]) 12/31/2019  . STEMI (ST elevation myocardial infarction) ([place]) 12/31/2019  . 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 Hepatitis B    Past Surgical History:  Past Surgical History:  Procedure Laterality Date  . AORTIC VALVE REPLACEMENT N/A 12/31/2019   Procedure: AORTIC VALVE REPLACEMENT (AVR);  Surgeon: Wonda Olds, MD;  Location: [facility];  Service: Open Heart Surgery;  Laterality: N/A;  . CLIPPING OF ATRIAL APPENDAGE Left 12/31/2019   Procedure: Clipping Of Atrial Appendage;  Surgeon: Wonda Olds, MD;  Location: [place];  Service: Open Heart Surgery;  Laterality: Left;  . CORONARY ARTERY BYPASS GRAFT N/A 12/31/2019   Procedure: CORONARY ARTERY BYPASS GRAFTING (CABG) TIMES THREE USING LEFT INTERNAL MAMMARY ARTERY AND LEFT GREATER SAPHENOUS LEG VEIN HARVESTED ENDOSCOPICALLY;  Surgeon: Wonda Olds, MD;  Location: [facility];  Service: Open Heart Surgery;  Laterality: N/A;  POSSIBLE BILATERAL IMA  . CORONARY/GRAFT ACUTE MI REVASCULARIZATION N/A 12/31/2019   Procedure: Coronary/Graft Acute MI Revascularization;  Surgeon: Yolonda Kida, MD;  Location: [facility];  Service: Cardiovascular;  Laterality: N/A;  . LEFT HEART CATH AND CORONARY ANGIOGRAPHY N/A 12/31/2019   Procedure: LEFT HEART CATH AND CORONARY ANGIOGRAPHY;  Surgeon: Yolonda Kida, MD;  Location: [facility];  Service:  Cardiovascular;  Laterality: N/A;  . MAZE N/A 12/31/2019   Procedure: MAZE;  Surgeon: Wonda Olds, MD;  Location: [place];  Service: Open Heart Surgery;  Laterality: N/A;  . TEE WITHOUT CARDIOVERSION N/A 12/31/2019   Procedure: TRANSESOPHAGEAL ECHOCARDIOGRAM (TEE);  Surgeon: Wonda Olds, MD;  Location: [place];  Service: Open Heart Surgery;  Laterality: N/A;    Assessment & Plan Clinical Impression: Patient is a 79 y.o. year old male with relatively good health who was originally admitted via [facility] on 12/30/2018 with sudden onset of chest pain due to STEMI.  He underwent cardiac cath emergently revealing aortic stenosis and ostial LAD lesion not felt to be optimal for PCI therefore IAPB placed and he was transferred to [facility] for management.  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 He was evaluated by Dr. Ahmed Prima and taken to the OR emergently for CABG X 3 with AVR and clipping of atrial appendage on the same day.  Post extubation had hypoxia requiring BiPAP as well as hypotension and A. fib with RVR.  He required Precedex due to delirium with agitation.  Acute hypoxic respiratory failure felt to be due to pulmonary edema and treated with diuresis--continues to have intermittent tachypnea and requires 2L oxygen per [place].  Acute blood loss anemia with thrombocytopenia and leukocytosis of 25,000 as well as abnormal LFTs was monitored with serial checks. Urine culture/blood cultures have been negative.    Mentation has fluctuated due to ongoing delirium and he was kept n.p.o with tube feeds till mentation improved.  Speech therapy following  for input and diet advanced to regular.  He developed pleuritic chest pain and found to have a friction rub 2/3. 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 Heart failure following patient to assist with management of fluid overload--milrinone d/c 2/3 with addition of digoxin and amiodarone changed to po route. CHF team following to optimize medication--Losartan added 2/04  with increase in spironolactone to 25 mg daily .  Patient transferred to CIR on 01/08/2020 .    Patient currently requires min - mod A with basic self-care skills secondary to muscle weakness, decreased cardiorespiratoy endurance and decreased sitting balance, decreased standing balance and decreased balance strategies.  Prior to hospitalization, patient could complete ADLs and IADLs with independent .  Patient will benefit from skilled intervention to decrease level of assist with basic self-care skills prior to discharge home with care partner.  Anticipate patient will require 24 hour supervision and follow up home health.  OT - End of Session Activity Tolerance: Decreased this session Endurance Deficit: Yes Endurance Deficit Description: multiple rest breaks secondary to fatigue OT Assessment Rehab Potential (ACUTE ONLY): Good OT Barriers to Discharge: Other (comments) OT Barriers to Discharge Comments: none known at this time OT Patient demonstrates impairments in the following area(s): Balance;Behavior;Endurance;Motor;Pain;Safety OT Basic ADL's Functional Problem(s): Grooming;Bathing;Dressing;Toileting OT Transfers Functional Problem(s): Toilet;Tub/Shower OT Additional Impairment(s): None OT Plan OT Intensity: Minimum of 1-2 x/day, 45 to 90 minutes OT Frequency: 5 out of 7 days OT Duration/Estimated Length of Stay: 10-14 days OT Treatment/Interventions: Balance/vestibular training;DME/adaptive equipment instruction;Patient/family education;Therapeutic Activities;Psychosocial support;Therapeutic Exercise;Community reintegration;Functional mobility training;Self Care/advanced ADL retraining;UE/LE Strength  taining/ROM;Discharge planning;Neuromuscular re-education;UE/LE Coordination activities OT Self Feeding Anticipated Outcome(s): n/a OT Basic Self-Care Anticipated Outcome(s): supervision OT Toileting Anticipated Outcome(s): supervision OT Bathroom Transfers Anticipated Outcome(s): supervision OT Recommendation Recommendations for Other Services: Neuropsych consult Patient destination: Home Follow Up Recommendations: Home health OT;24 hour supervision/assistance Equipment Recommended: Tub/shower bench;3 in 1 bedside comode   Skilled Therapeutic Intervention Session 1: Upon entering the room, pt supine in bed with no c/o pain and eager for OT intervention. OT reviewed pt's sternal precautions with him and he verbalized understanding. Sit <>stand initially supervision but needing min A as pt fatigues and posterior bias present as well. Pt ambulating with RW and min A into bathroom for toileting. Pt doffing clothing items while seated on toilet and then transferring to shower chair with min A. OT covered IV and no direct water to sternal incision. Pt bathing with overall min A but needing additional assistance secondary to fatigue. Pt taking multiple rest breaks this session. Dressing while seated on EOB with min cuing for safety awareness and to maintain precautions. Pt standing at sink for 4 minutes for grooming assistance with min guard - min A standing balance. Pt returning to supine at end of session secondary to fatigue. Bed alarm activated and call bell within reach upon exiting the room. OT educated pt on OT purpose, POC, and goals with him verbalizing understanding and agreement this session.   Session 2: Upon entering the room, pt supine in bed with no c/o pain and daughter present in room. Pt requesting to change LB clothing as it is "too tight". Supine >sit with min A and sit <>stand with  Min A. Pt returning to sitting and threading pants onto B feet with increased time and pt standing with  min  A and min - mod A standing balance to pull pant over B hips. Pt verbalizing, " I need to go to the bathroom again. It just hits me like that." Pt ambulating with RW into bathroom with min A and seated on commode. Pt continues to have liquid stools and needing mod A to stand from low commode. Pt able to perform his own hygiene with min cuing to maintain precautions. Pt ambulating and standing at sink for hand hygiene with min A before returning to sit on EOB. Pt appears very fatigued and OT provided min A for supine >sit. OT reviewing OT POC and goals with family member and answering questions as needed about OT/inpt rehab process. Energy conservation education started as well but to continue. Call bell and all needed items within reach upon exiting the room.   OT Evaluation Precautions/Restrictions  Precautions Precautions: Sternal;Fall Precaution Comments: Reviewed sternal precautions Restrictions Weight Bearing Restrictions: Yes(sternal precautions) Vital Signs Therapy Vitals Pulse Rate: 72 BP: (!) 141/72 Patient Position (if appropriate): Sitting Oxygen Therapy SpO2: 96 % O2 Device: Room Air Pain Pain Assessment Pain Scale: 0-10 Pain Score: 0-No pain Home Living/Prior Functioning Home Living Family/patient expects to be discharged to:: Private residence Living Arrangements: Alone Available Help at Discharge: Available PRN/intermittently, Family Type of Home: House Home Access: Stairs to enter Technical brewer of Steps: 2 steps Home Layout: One level Bathroom Shower/Tub: Optometrist: Yes  Lives With: Alone Prior Function Level of Independence: Independent with basic ADLs, Independent with homemaking with ambulation, Independent with gait, Independent with transfers Driving: Yes Comments: pt previously indpendent without AD, enjoys walking for exercise. driving independently Vision Baseline Vision/History: Wears  glasses Wears Glasses: Reading only Patient Visual Report: No change from baseline Vision Assessment?: No apparent visual deficits Cognition Overall Cognitive Status: Within Functional Limits for tasks assessed Arousal/Alertness: Awake/alert Orientation Level: Person;Place;Situation Person: Oriented Place: Oriented Situation: Oriented Year: 2021 Month: February Day of Week: Correct Immediate Memory Recall: Sock;Blue;Bed Memory Recall Sock: Without Cue Memory Recall Blue: Without Cue Memory Recall Bed: Without Cue Sensation Sensation Light Touch: Appears Intact Hot/Cold: Appears Intact Proprioception: Appears Intact Stereognosis: Not tested Coordination Gross Motor Movements are Fluid and Coordinated: Yes Fine Motor Movements are Fluid and Coordinated: Yes Finger Nose Finger Test: WFLs Motor  Motor Motor - Skilled Clinical Observations: generalized weakness Mobility  Bed Mobility Bed Mobility: Rolling Right;Rolling Left Rolling Right: Minimal Assistance - Patient > 75% Rolling Left: Minimal Assistance - Patient > 75% Transfers Sit to Stand: Minimal Assistance - Patient > 75%  Trunk/Postural Assessment  Cervical Assessment Cervical Assessment: Exceptions to WFL(forward head) Thoracic Assessment Thoracic Assessment: Exceptions to WFL(rounded shoulders) Lumbar Assessment Lumbar Assessment: (posterior pelvic tilt) Postural Control Postural Control: Deficits on evaluation  Balance Balance Balance Assessed: Yes Static Sitting Balance Static Sitting - Balance Support: Feet supported Static Sitting - Level of Assistance: 5: Stand by assistance Dynamic Sitting Balance Dynamic Sitting - Balance Support: Feet supported Dynamic Sitting - Level of Assistance: 5: Stand by assistance Dynamic Sitting - Balance Activities: Reaching for objects;Reaching across midline Static Standing Balance Static Standing - Balance Support: During functional activity Static Standing - Level  of Assistance: 4: Min assist Dynamic Standing Balance Dynamic Standing - Balance Support: During functional activity Dynamic Standing - Level of Assistance: 4: Min assist Extremity/Trunk Assessment RUE Assessment RUE Assessment: Within Functional Limits General Strength Comments: WFLs but not formally tested secondary to sternal precautions LUE Assessment LUE Assessment: Within Functional Limits General Strength Comments:  WFLs but not formally tested secondary to sternal precautions     Refer to Care Plan for Long Term Goals  Recommendations for other services: Neuropsych   Discharge Criteria: Patient will be discharged from OT if patient refuses treatment 3 consecutive times without medical reason, if treatment goals not met, if there is a change in medical status, if patient makes no progress towards goals or if patient is discharged from hospital.  The above assessment, treatment plan, treatment alternatives and goals were discussed and mutually agreed upon: by patient  Gypsy Decant 01/09/2020, 11:03 AM

## 2020-01-10 MED ORDER — CHLORHEXIDINE GLUCONATE 0.12 % MT SOLN
15.0000 mL | Freq: Two times a day (BID) | OROMUCOSAL | Status: DC
  Administered 2020-01-10 – 2020-01-29 (×37): 15 mL via OROMUCOSAL
  Filled 2020-01-10 (×38): qty 15

## 2020-01-10 NOTE — Progress Notes (Signed)
[facility] PHYSICAL MEDICINE & REHABILITATION PROGRESS NOTE   Subjective/Complaints:   Pt reports the catheter "chest pain" is pretty much gone and wasn't an issue.   Feels loose stools are much better, although did have loose incontinent episode last night when trying to get to bathroom- to use BSC from now on, per nursing notes.   Overalll, having fewer BMs, per pt.   ROS: pt denies cardiac CP, SOB, abd pain, HA, vision changes, mood changes, N/V/C however does c/o loose stools due to colchicine  Objective:   No results found. Recent Labs    01/08/20 0255 01/09/20 1205  WBC 16.5* 14.0*  HGB 12.1* 11.2*  HCT 37.7* 34.6*  PLT 156 215   Recent Labs    01/08/20 0255 01/09/20 1205  NA 147* 145  K 4.4 3.9  CL 111 116*  CO2 25 19*  GLUCOSE 129* 146*  BUN 27* 23  CREATININE 1.20 1.23  CALCIUM 8.6* 8.4*    Intake/Output Summary (Last 24 hours) at 01/10/2020 1234 Last data filed at 01/10/2020 0827 Gross per 24 hour  Intake 900 ml  Output 350 ml  Net 550 ml     Physical Exam: Vital Signs Blood pressure (!) 152/69, pulse 67, temperature 98 F (36.7 C), resp. rate 19, height 5\' 8"  (1.727 m), weight 57.8 kg, SpO2 93 %.  Physical Exam  General: Alert and oriented x 3, No apparent distress Sitting up at bedside in chair; reading book; appropriate; NAD HEENT: conjugate gaze; trachea midline Neck: Supple with no JVD Heart: Reg rate and rhythm. Chest: CTA bilaterally without wheezes, rales, or rhonchi; no distress Abdomen: Soft, non-tender, non-distended, (+) BS Extremities: No clubbing, cyanosis, or edema. Pulses are 2+ Skin: Surgical incision on chest clean and intact without signs of breakdown. Some tenderness to palpation over chest wall around incision site- site of pt's noncardiac chest pain which has resolved Neuro: Pt is cognitively appropriate with normal insight, memory, and awareness. Cranial nerves 2-12 are intact. Sensory exam is normal. Reflexes are 2+ in all  4's. Fine motor coordination is intact. No tremors. Motor function is grossly 5/5.  Musculoskeletal: Full ROM, No pain with AROM or PROM in the neck, trunk, or extremities. Posture appropriate Psych: Pt's affect is appropriate. Pt is cooperative   Assessment/Plan: 1. 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  Physiatrist is providing close team supervision and 24 hour management of active medical problems listed below.  Physiatrist and rehab team continue to assess barriers to discharge/monitor patient progress toward functional and medical goals  Care Tool:  Bathing    Body parts bathed by patient: Right arm, Left arm, Front perineal area, Buttocks, Right upper leg, Left upper leg, Face   Body parts bathed by helper: Chest, Abdomen, Right lower leg, Left lower leg     Bathing assist Assist Level: Moderate Assistance - Patient 50 - 74%     Upper Body Dressing/Undressing Upper body dressing   What is the patient wearing?: Button up shirt    Upper body assist Assist Level: Moderate Assistance - Patient 50 - 74%    Lower Body Dressing/Undressing Lower body dressing      What is the patient wearing?: Pants, Underwear/pull up     Lower body assist Assist for lower body dressing: Moderate Assistance - Patient 50 - 74%     Toileting Toileting    Toileting assist Assist  for toileting: Minimal Assistance - Patient > 75%     Transfers Chair/bed transfer  Transfers assist     Chair/bed transfer assist level: Minimal Assistance - Patient > 75%     Locomotion Ambulation   Ambulation assist      Assist level: Minimal Assistance - Patient > 75% Assistive device: No Device Max distance: 62ft   Walk 10 feet activity   Assist     Assist level: Minimal Assistance - Patient > 75% Assistive device: No Device   Walk 50 feet activity   Assist     Assist level: Minimal Assistance - Patient > 75% Assistive device: No Device    Walk 150 feet activity   Assist Walk 150 feet activity did not occur: Safety/medical concerns         Walk 10 feet on uneven surface  activity   Assist     Assist level: Minimal Assistance - Patient > 75%     Wheelchair     Assist Will patient use wheelchair at discharge?: No(unless needed for dependent transport in community due to limited ambulation distance)             Wheelchair 50 feet with 2 turns activity    Assist            Wheelchair 150 feet activity     Assist          Blood pressure (!) 152/69, pulse 67, temperature 98 F (36.7 C), resp. rate 19, height 5\' 8"  (1.727 m), weight 57.8 kg, SpO2 93 %.  Medical Problem List and Plan: 1.  Impaired mobility and ADLs secondary to cardiac debility s/p CABGx3.             -patient may shower             -ELOS/Goals: modI in PT, OT, I in SLP             -2 weeks colchicine for pericardial irritation 2.  Antithrombotics: -DVT/anticoagulation: Starting Eliquis today (2/5)              -antiplatelet therapy: On ASA 3. Pain Management: Denies pain, not receiving any pain medications.  4. Mood: LCSW to follow for evaluations and support.              -antipsychotic agents:  N/A 5. Neuropsych: This patient is capable of making decisions on his own behalf. 6. Skin/Wound Care: Monitor incisions for healing.  7. Fluids/Electrolytes/Nutrition: Offer supplements between meals to promote wound healing.  8. AFib: Continue to monitor HR tid and with increase in activity. Continue Amiodarone bid 9. Persistent Leucocytosis: WBC up to 16,000 +--infectious work up negative s/p IV Cefepime.   2/6- WBC down to 14k- febrile since admission- will con't to monitor 10 Acute renal failure with hypernatremia: Need to encourage fluid intake.   2.6- Cr down to 1.23 and BUN down to 23- recovering- Na 145- so improving- con't  regimen 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 On ASA, Losartan, digoxin, coreg, Lipitor.   Filed Weights   01/08/20 1838 01/09/20 0456 01/10/20 0443  Weight: 60.9 kg 58.7 kg 57.8 kg    2/6- Weight down 2kg in 1 day- will montor for trend  2/7- Weight down another 1 kg- labs in AM 12. Thrombocytopenia: Has resolved.  13. Shortness of breath: Encourage IS. Continue  14. Constipation: Resolved. Can DC daily docusate, miralax, and dulcolax. Patient is refusing and moving bowels regularly.  2/6- having loose stools- due to colchicine likely- will d/c Colace and add Fibercon daily  2/7- improved per pt.  15. Disposition: Lives alone, but has supportive family, including daughter, nearby.      LOS: 2 days A FACE TO FACE EVALUATION WAS PERFORMED  Mackenzi Krogh 01/10/2020, 12:34 PM

## 2020-01-10 NOTE — Plan of Care (Signed)
  Problem: Consults Goal: RH GENERAL PATIENT EDUCATION Description: See Patient Education module for education specifics. Outcome: Progressing Goal: Skin Care Protocol Initiated - if Braden Score 18 or less Description: If consults are not indicated, leave blank or document N/A Outcome: Progressing   Problem: RH SKIN INTEGRITY Goal: RH STG MAINTAIN SKIN INTEGRITY WITH ASSISTANCE Description: STG Maintain Skin Integrity With min Assistance. Outcome: Progressing Goal: RH STG ABLE TO PERFORM INCISION/WOUND CARE W/ASSISTANCE Description: STG Able To Perform Incision/Wound Care With min Assistance. Outcome: Progressing   Problem: RH SAFETY Goal: RH STG ADHERE TO SAFETY PRECAUTIONS W/ASSISTANCE/DEVICE Description: STG Adhere to Safety Precautions With min Assistance and appropriate assistive Device. Outcome: Progressing   Problem: RH PAIN MANAGEMENT Goal: RH STG PAIN MANAGED AT OR BELOW PT'S PAIN GOAL Description: <3 on a 0-10 pain scale Outcome: Progressing   Problem: RH KNOWLEDGE DEFICIT GENERAL Goal: RH STG INCREASE KNOWLEDGE OF SELF CARE AFTER HOSPITALIZATION Description: Patient will demonstrate knowledge of medication management, incision site care, and follow up care with the MD post discharge with min assist from rehab staff. Outcome: Progressing

## 2020-01-10 NOTE — Progress Notes (Signed)
At [facility], requesting to go to [facility]. On the way to [facility], patient with incontinent mushy stool. Patient very upset about being incontinent. Patient agreeable to use BSC, to decrease chance of incontinence. BLE's with pitting edema. Indented area to left shin, reports tender to touch, no redness noted. PRN tylenol given at 2035. Patrici Ranks A

## 2020-01-11 ENCOUNTER — Inpatient Hospital Stay (HOSPITAL_COMMUNITY): Payer: Medicare Other

## 2020-01-11 ENCOUNTER — Inpatient Hospital Stay (HOSPITAL_COMMUNITY): Payer: Medicare Other | Admitting: Physical Therapy

## 2020-01-11 ENCOUNTER — Inpatient Hospital Stay (HOSPITAL_COMMUNITY): Payer: Medicare Other | Admitting: Occupational Therapy

## 2020-01-11 LAB — CBC WITH DIFFERENTIAL/PLATELET
Abs Immature Granulocytes: 0.2 10*3/uL — ABNORMAL HIGH (ref 0.00–0.07)
Basophils Absolute: 0 10*3/uL (ref 0.0–0.1)
Basophils Relative: 0 %
Eosinophils Absolute: 0.4 10*3/uL (ref 0.0–0.5)
Eosinophils Relative: 2 %
HCT: 34.1 % — ABNORMAL LOW (ref 39.0–52.0)
Hemoglobin: 11.3 g/dL — ABNORMAL LOW (ref 13.0–17.0)
Immature Granulocytes: 1 %
Lymphocytes Relative: 8 %
Lymphs Abs: 1.5 10*3/uL (ref 0.7–4.0)
MCH: 33.4 pg (ref 26.0–34.0)
MCHC: 33.1 g/dL (ref 30.0–36.0)
MCV: 100.9 fL — ABNORMAL HIGH (ref 80.0–100.0)
Monocytes Absolute: 1.4 10*3/uL — ABNORMAL HIGH (ref 0.1–1.0)
Monocytes Relative: 8 %
Neutro Abs: 14.5 10*3/uL — ABNORMAL HIGH (ref 1.7–7.7)
Neutrophils Relative %: 81 %
Platelets: 270 10*3/uL (ref 150–400)
RBC: 3.38 MIL/uL — ABNORMAL LOW (ref 4.22–5.81)
RDW: 18.3 % — ABNORMAL HIGH (ref 11.5–15.5)
WBC: 18.1 10*3/uL — ABNORMAL HIGH (ref 4.0–10.5)
nRBC: 0 % (ref 0.0–0.2)

## 2020-01-11 LAB — BASIC METABOLIC PANEL
Anion gap: 8 (ref 5–15)
BUN: 14 mg/dL (ref 8–23)
CO2: 21 mmol/L — ABNORMAL LOW (ref 22–32)
Calcium: 8.2 mg/dL — ABNORMAL LOW (ref 8.9–10.3)
Chloride: 118 mmol/L — ABNORMAL HIGH (ref 98–111)
Creatinine, Ser: 1.02 mg/dL (ref 0.61–1.24)
GFR calc Af Amer: >60 mL/min (ref >60)
GFR calc non Af Amer: >60 mL/min (ref >60)
Glucose, Bld: 104 mg/dL — ABNORMAL HIGH (ref 70–99)
Potassium: 3.8 mmol/L (ref 3.5–5.1)
Sodium: 147 mmol/L — ABNORMAL HIGH (ref 135–145)

## 2020-01-11 MED ORDER — MELATONIN 3 MG PO TABS
6.0000 mg | ORAL_TABLET | Freq: Every day | ORAL | Status: DC
  Administered 2020-01-11 – 2020-01-13 (×3): 6 mg via ORAL
  Filled 2020-01-11 (×3): qty 2

## 2020-01-11 NOTE — Progress Notes (Signed)
Inpatient Rehabilitation  Patient information reviewed and entered into eRehab system by Deana Krock M. Janique Hoefer, M.A., CCC/SLP, PPS Coordinator.  Information including medical coding, functional ability and quality indicators will be reviewed and updated through discharge.    

## 2020-01-11 NOTE — Care Management (Signed)
Inpatient Rehabilitation Center Individual Statement of Services  Patient Name:  David Perez  Date:  01/11/2020  Welcome to the [facility].  Our goal is to provide you with an individualized program based on your diagnosis and situation, designed to meet your specific needs.  With this comprehensive rehabilitation program, you will be expected to participate in at least 3 hours of rehabilitation therapies Monday-Friday, with modified therapy programming on the weekends.  Your rehabilitation program will include the following services:  Physical Therapy (PT), Occupational Therapy (OT), 24 hour per day rehabilitation nursing, Therapeutic Recreaction (TR), Neuropsychology, Case Management (Social Worker), Rehabilitation Medicine, Nutrition Services and Pharmacy Services  Weekly team conferences will be held on Tuesdays to discuss your progress.  Your Social Worker will talk with you frequently to get your input and to update you on team discussions.  Team conferences with you and your family in attendance may also be held.  Expected length of stay: 10-14 days   Overall anticipated outcome: supervision  Depending on your progress and recovery, your program may change. Your Social Worker will coordinate services and will keep you informed of any changes. Your Social Worker's name and contact numbers are listed  below.  The following services may also be recommended but are not provided by the [facility] will be made to provide these services after discharge if needed.  Arrangements include referral to agencies that provide these services.  Your insurance has been verified to be:  Medicare Your primary doctor is:  none  Pertinent information will be shared with your doctor and your insurance company.  Social Worker:  [place], [place] or ([phone]   Information discussed with and copy given to patient by: Lennart Pall, 01/11/2020, 3:14 PM

## 2020-01-11 NOTE — Progress Notes (Signed)
Occupational Therapy Session Note  Patient Details  Name: David Perez MRN: [id number] Date of Birth: [birth year]  Today's Date: 01/11/2020 OT Individual Time: 1100-1203 OT Individual Time Calculation (min): 63 min    Short Term Goals: Week 1:  OT Short Term Goal 1 (Week 1): Pt will perform UB dressing with min A and min cuing to maintain precautions. OT Short Term Goal 2 (Week 1): Pt will perform bathing tasks with min A overall. OT Short Term Goal 3 (Week 1): PT will perform LB dressing with min A overall.  Skilled Therapeutic Interventions/Progress Updates:    Patient seated in recliner, daughter present.  Reviewed sternal precautions and ADL strategies to maintain precautions.  He is able to doff socks and shoes by crossing legs, max A to donn TEDS and shoes.  SPT recliner to w/c with min A.  Reviewed and practiced DME for bathroom - he is able to complete SPT to/from tub bench and commode with CGA and recommend both for home use post rehab stay.  Completed UB light mobility activities (below 90, no resistance), trunk/pelvic mobility activities, scapular mobility - good awareness of sternal precautions.  Reviewed and practiced DBT - ongoing cues and practice needed.  Completed ambulation with RW 30 feet with CGA - min cues for safety and patient notes significant fatigue upon return to chair - O2 sat 95% on room air.  Patient remained seated in recliner at close of session, seat belt alarm set and call bell in reach.    Therapy Documentation Precautions:  Precautions Precautions: Sternal, Fall Precaution Comments: Reviewed sternal precautions Restrictions Weight Bearing Restrictions: Yes(sternal precautions) General:   Vital Signs:   Pain: Pain Assessment Pain Scale: 0-10 Pain Score: 0-No pain Other Treatments:     Therapy/Group: Individual Therapy  [name] 01/11/2020, 12:50 PM

## 2020-01-11 NOTE — Progress Notes (Signed)
Social Work Assessment and Plan   Patient Details  Name: David Perez MRN: [id number] Date of Birth: [birth year]  Today's Date: 01/11/2020  Problem List:  Patient Active Problem List   Diagnosis Date Noted  . Debility 01/08/2020  . STEMI involving left main coronary artery ([place]) 12/31/2019  . STEMI (ST elevation myocardial infarction) ([place]) 12/31/2019  . 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 Hepatitis B    Past Surgical History:  Past Surgical History:  Procedure Laterality Date  . AORTIC VALVE REPLACEMENT N/A 12/31/2019   Procedure: AORTIC VALVE REPLACEMENT (AVR);  Surgeon: Wonda Olds, MD;  Location: [facility];  Service: Open Heart Surgery;  Laterality: N/A;  . CLIPPING OF ATRIAL APPENDAGE Left 12/31/2019   Procedure: Clipping Of Atrial Appendage;  Surgeon: Wonda Olds, MD;  Location: [facility];  Service: Open Heart Surgery;  Laterality: Left;  . CORONARY ARTERY BYPASS GRAFT N/A 12/31/2019   Procedure: CORONARY ARTERY BYPASS GRAFTING (CABG) TIMES THREE USING LEFT INTERNAL MAMMARY ARTERY AND LEFT GREATER SAPHENOUS LEG VEIN HARVESTED ENDOSCOPICALLY;  Surgeon: Wonda Olds, MD;  Location: [facility];  Service: Open Heart Surgery;  Laterality: N/A;  POSSIBLE BILATERAL IMA  . CORONARY/GRAFT ACUTE MI REVASCULARIZATION N/A 12/31/2019   Procedure: Coronary/Graft Acute MI Revascularization;  Surgeon: Yolonda Kida, MD;  Location: [facility];  Service: Cardiovascular;  Laterality: N/A;  . LEFT HEART CATH AND CORONARY ANGIOGRAPHY N/A 12/31/2019   Procedure: LEFT HEART CATH AND CORONARY ANGIOGRAPHY;  Surgeon: Yolonda Kida, MD;  Location: [facility];  Service: Cardiovascular;  Laterality: N/A;  . MAZE N/A 12/31/2019   Procedure: MAZE;  Surgeon: Wonda Olds, MD;  Location: [facility];  Service: Open Heart Surgery;  Laterality: N/A;  . TEE WITHOUT CARDIOVERSION N/A 12/31/2019   Procedure: TRANSESOPHAGEAL  ECHOCARDIOGRAM (TEE);  Surgeon: Wonda Olds, MD;  Location: [facility];  Service: Open Heart Surgery;  Laterality: N/A;   Social History:  reports that he has never smoked. He has never used smokeless tobacco. He reports previous alcohol use. He reports previous drug use.  Family / Support Systems Marital Status: Widow/Widower Patient Roles: Parent Children: daughters:  Galen Daft @ [phone];  Langley Gauss - both living fairly close by Anticipated Caregiver: daughters David Perez and Sheridan) Ability/Limitations of Caregiver: Supervision Caregiver Availability: 24/7 Family Dynamics: Pt very concerned about not placing any burden on daughters.  Daughter, David Perez, at bedside and remains pretty quiet unless asked a direct question.  Pt a little tearful when he talks about not wanting to "interfere.Marland KitchenMarland KitchenLangley Gauss has a baby - a 79 yr old - she can't stay with me all the time."  Social History Preferred language: English Religion: Catholic Cultural Background: NA Read: Yes Write: Yes Employment Status: Retired Public relations account executive Issues: none Guardian/Conservator: none - per MD, pt is capable of making decisions on his own behalf.   Abuse/Neglect Abuse/Neglect Assessment Can Be Completed: Yes Physical Abuse: Denies Verbal Abuse: Denies Sexual Abuse: Denies Exploitation of patient/patient's resources: Denies Self-Neglect: Denies  Emotional Status Pt's affect, behavior and adjustment status: As noted, pt very focused on needing to "be able to do everything like I could.  I don't need to bother the girls. But you gotta give me the time here to get better."  Pt seems a little "grumpy" during interview and distressed by his weakness.  Will monitor mood and refer for neuropsychology as indicated. Recent Psychosocial Issues: none Psychiatric  History: none Substance Abuse History: none  Patient / Family Perceptions, Expectations & Goals Pt/Family understanding of illness & functional  limitations: Pt and daughter with good, general understanding of surgery performed and current functional limitations/ need for CIR. Premorbid pt/family roles/activities: Pt was completely independent, driving. Anticipated changes in roles/activities/participation: Daughters to assume some caregiver support roles. Pt/family expectations/goals: Pt's goal is to be independent and able to drive again.  Community Resources [employer]: None Premorbid Home Care/DME Agencies: None Transportation available at discharge: yes Resource referrals recommended: Neuropsychology  Discharge Planning Living Arrangements: Alone Support Systems: Children Type of Residence: Private residence Insurance Resources: [employer] Financial Resources: Social Security Financial Screen Referred: No Living Expenses: Own Money Management: Patient Does the patient have any problems obtaining your medications?: No Home Management: pt Patient/Family Preliminary Plans: Pt to return to his home with daughters providing supervision Social Work Anticipated Follow Up Needs: HH/OP Expected length of stay: 10-14 days  Clinical Impression Elderly gentleman here for debility following STEMI with CABG and AVR.  Daughter, David Perez, at bedside and supportive.  Pt very adamant about wanting to have enough time here to reach his prior level of independence.  Very concerned about being a burden to his daughters.  Will follow for support and d/c planning needs.  Montgomery Rothlisberger 01/11/2020, 3:25 PM

## 2020-01-11 NOTE — Progress Notes (Signed)
[facility] PHYSICAL MEDICINE & REHABILITATION PROGRESS NOTE   Subjective/Complaints: Denies pain. Moving bowels less frequently- was having loose stools Slept poorly last night. Says that he takes a larger dose of melatonin at home.  Labs drawn this morning.   ROS: pt denies cardiac CP, SOB, abd pain, HA, vision changes, mood changes, N/V/C however does c/o loose stools due to colchicine  Objective:   No results found. Recent Labs    01/09/20 1205 01/11/20 0503  WBC 14.0* 18.1*  HGB 11.2* 11.3*  HCT 34.6* 34.1*  PLT 215 270   Recent Labs    01/09/20 1205 01/11/20 0503  NA 145 147*  K 3.9 3.8  CL 116* 118*  CO2 19* 21*  GLUCOSE 146* 104*  BUN 23 14  CREATININE 1.23 1.02  CALCIUM 8.4* 8.2*    Intake/Output Summary (Last 24 hours) at 01/11/2020 1148 Last data filed at 01/11/2020 1040 Gross per 24 hour  Intake 740 ml  Output 675 ml  Net 65 ml     Physical Exam: Vital Signs Blood pressure 134/63, pulse 72, temperature 98.1 F (36.7 C), resp. rate 17, height 5\' 8"  (1.727 m), weight 52.2 kg, SpO2 99 %.  Physical Exam  General: Alert and oriented x 3, No apparent distress. Sitting up in chair, pleasant HEENT: conjugate gaze; trachea midline Neck: Supple with no JVD Heart: Reg rate and rhythm. Chest: CTA bilaterally without wheezes, rales, or rhonchi; no distress Abdomen: Soft, non-tender, non-distended, (+) BS Extremities: No clubbing, cyanosis, or edema. Pulses are 2+ Skin: Surgical incision on chest clean and intact without signs of breakdown. Some tenderness to palpation over chest wall around incision site- site of pt's noncardiac chest pain which has resolved Neuro: Pt is cognitively appropriate with normal insight, memory, and awareness. Cranial nerves 2-12 are intact. Sensory exam is normal. Reflexes are 2+ in all 4's. Fine motor coordination is intact. No tremors. Motor function is grossly 5/5.  Musculoskeletal: Full ROM, No pain with AROM or PROM in the neck,  trunk, or extremities. Posture appropriate Psych: Pt's affect is appropriate. Pt is cooperative   Assessment/Plan: 1. 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  Physiatrist is providing close team supervision and 24 hour management of active medical problems listed below.  Physiatrist and rehab team continue to assess barriers to discharge/monitor patient progress toward functional and medical goals  Care Tool:  Bathing    Body parts bathed by patient: Buttocks, Front perineal area   Body parts bathed by helper: Chest, Abdomen, Right lower leg, Left lower leg     Bathing assist Assist Level: Supervision/Verbal cueing     Upper Body Dressing/Undressing Upper body dressing Upper body dressing/undressing activity did not occur (including orthotics): N/A What is the patient wearing?: Hospital gown only    Upper body assist Assist Level: Set up assist    Lower Body Dressing/Undressing Lower body dressing      What is the patient wearing?: Incontinence brief     Lower body assist Assist for lower body dressing: Minimal Assistance - Patient > 75%     Toileting Toileting    Toileting assist Assist for toileting: Minimal Assistance - Patient > 75%     Transfers Chair/bed transfer  Transfers assist     Chair/bed transfer assist level: Minimal Assistance - Patient > 75%     Locomotion Ambulation   Ambulation assist  Assist level: Minimal Assistance - Patient > 75% Assistive device: No Device Max distance: 60   Walk 10 feet activity   Assist     Assist level: Minimal Assistance - Patient > 75% Assistive device: No Device   Walk 50 feet activity   Assist    Assist level: Minimal Assistance - Patient > 75% Assistive device: No Device    Walk 150 feet activity   Assist Walk 150 feet activity did not occur: Safety/medical  concerns         Walk 10 feet on uneven surface  activity   Assist     Assist level: Minimal Assistance - Patient > 75%     Wheelchair     Assist Will patient use wheelchair at discharge?: No(unless needed for dependent transport in community due to limited ambulation distance)             Wheelchair 50 feet with 2 turns activity    Assist            Wheelchair 150 feet activity     Assist          Blood pressure 134/63, pulse 72, temperature 98.1 F (36.7 C), resp. rate 17, height 5\' 8"  (1.727 m), weight 52.2 kg, SpO2 99 %.  Medical Problem List and Plan: 1.  Impaired mobility and ADLs secondary to cardiac debility s/p CABGx3.             -patient may shower             -ELOS/Goals: modI in PT, OT, I in SLP             -2 weeks colchicine for pericardial irritation  -Continue CIR PT and OT 2.  Antithrombotics: -DVT/anticoagulation: Starting Eliquis today (2/5)              -antiplatelet therapy: On ASA 3. Pain Management: Denies pain, not receiving any pain medications.  4. Mood: LCSW to follow for evaluations and support.              -antipsychotic agents:  N/A 5. Neuropsych: This patient is capable of making decisions on his own behalf. 6. Skin/Wound Care: Monitor incisions for healing.  7. Fluids/Electrolytes/Nutrition: Offer supplements between meals to promote wound healing.  8. AFib: Continue to monitor HR tid and with increase in activity. Continue Amiodarone bid 9. Persistent Leucocytosis: WBC up to 16,000 +--infectious work up negative s/p IV Cefepime.   2/6- WBC down to 14k- afebrile since admission- will con't to monitor  2/8: Up to 18K. Afebrile, continue to monitor.  10 Acute renal failure with hypernatremia: Need to encourage fluid intake.   2.6- Cr down to 1.23 and BUN down to 23- recovering- Na 145- so improving- con't regimen  2/8: Na increased to 147.  11. Acute systolic CHF:  Monitor for signs of overload and check daily  weights. On ASA, Losartan, digoxin, coreg, Lipitor.   Filed Weights   01/09/20 0456 01/10/20 0443 01/11/20 0520  Weight: 58.7 kg 57.8 kg 52.2 kg    2/6- Weight down 2kg in 1 day- will montor for trend  2/7- Weight down another 1 kg- labs in AM  2/8: weight decreased.  12. Thrombocytopenia: Has resolved.  13. Shortness of breath: Encourage IS. Continue  14. Constipation: Resolved. Can DC daily docusate, miralax, and dulcolax. Patient is refusing and moving bowels regularly.   2/6- having loose stools- due to colchicine likely- will d/c Colace and add Fibercon daily  2/7- improved  per pt.   2/8: continues to improve.  15. Disposition: Lives alone, but has supportive family, including daughter, nearby.      LOS: 3 days A FACE TO FACE EVALUATION WAS PERFORMED  Clide Deutscher Skylarr Liz 01/11/2020, 11:48 AM

## 2020-01-11 NOTE — Progress Notes (Signed)
Physical Therapy Session Note  Patient Details  Name: David Perez MRN: [id number] Date of Birth: 1941/03/26  Today's Date: 01/11/2020 PT Individual Time: 0917-1030 PT Individual Time Calculation (min): 73 min   Short Term Goals: Week 1:  PT Short Term Goal 1 (Week 1): Pt will perform functional transfers with CGA PT Short Term Goal 2 (Week 1): Pt will ambulate at least 64ft with CGA using LRAD PT Short Term Goal 3 (Week 1): Pt will ascend/descend 4 steps using L HR only with CGA PT Short Term Goal 4 (Week 1): Pt will demonstrate improvement by at least 26ft on the 6MWT  Skilled Therapeutic Interventions/Progress Updates: Pt presents seated in recliner agreeable to participate in therapy.  Assisted pt w/ donning pants, shirt, socks and shoes w/ sternal precautions reviewed, including no bending forward.  Pt stood / min A to pull up pants.  O2 sats taken w/ pulse ox at 78%, but taken again w/ Dynomap at 98%.  Nursing aware of issue w/ handheld pulse Ox.  Pt performed LE there ex to increase strength and endurance w/ pt requesting seated rest breaks between trials d/t SOB, although O2 sats remained >95% on RA.  Pt performed calf raises, LAQ, hip flexion and abd/add 3 x 10-15.  Pt amb w/ RW to sink to comb hair w/o LOB.  Pt amb w/ RW up to 40-60' w/ CGA and cueing for safe turns and postureand proper step length.  Pt also amb w/o AD and min A up to 60' w/ verbal cues for step length and foot clearance.  Pt w/ 2 LOB with turns to return to seat.  Pt requests extended seat rest d/t SOB.  Nursing also aware of c/o hoarseness of throat.  Pt returned to recliner w/ all needs in reach and chair alarm on.  Daughter present in room at conclusion of therapy.      Therapy Documentation Precautions:  Precautions Precautions: Sternal, Fall Precaution Comments: Reviewed sternal precautions Restrictions Weight Bearing Restrictions: Yes(sternal precautions) General:   Vital Signs: Therapy Vitals Pulse Rate:  72 BP: 134/63 Patient Position (if appropriate): Sitting Oxygen Therapy SpO2: 99 % O2 Device: Room Air Pain: 0/10 Pain Assessment Pain Scale: 0-10 Pain Score: 0-No pain      Therapy/Group: Individual Therapy  Ladoris Gene 01/11/2020, 10:43 AM

## 2020-01-11 NOTE — Progress Notes (Signed)
Physical Therapy Session Note  Patient Details  Name: David Perez MRN: [id number] Date of Birth: 22-Nov-1941  Today's Date: 01/11/2020 PT Individual Time: [id number] PT Individual Time Calculation (min): 73 min   Short Term Goals: Week 1:  PT Short Term Goal 1 (Week 1): Pt will perform functional transfers with CGA PT Short Term Goal 2 (Week 1): Pt will ambulate at least 14ft with CGA using LRAD PT Short Term Goal 3 (Week 1): Pt will ascend/descend 4 steps using L HR only with CGA PT Short Term Goal 4 (Week 1): Pt will demonstrate improvement by at least 66ft on the 6MWT  Skilled Therapeutic Interventions/Progress Updates:     Patient in recliner in room upon PT arrival. Patient alert and agreeable to PT session. Patient denied pain during session, however, reported increased fatigue today with SOB with minimal activity. Provided education on general CABG recovery, energy conservation techniques, and utilization of incentive spirometer.   Therapeutic Activity: Transfers: Patient performed sit to/from stand x7 with supervision-CGA with and without RW, with 1 trial requiring 2 attempts due to decreased forward weight shift. Provided verbal cues for forward weight shift, reaching back to sit, scooting forward to stand, and hand placement on RW. He performed a toilet transfer from a standard toilet with supervision using a RW. He was continent of bowl and bladder and required supervision for peri-care and LB dressing during toilting. Upon standing from the toilet, noted blood smear on toilet seat, checked patient and noted a open wound on his R upper posterior thigh, just below the gluteal fold, RN made aware and provided dressing during session. Patient also complained of pain and "stinging" on a small healing, dry, wound on his anterior shin, RN provided a dressing for this wound as well.  Gait Training:  Patient ambulated 120 feet using RW with CGA and w/c follow due to decreased activity  tolerance. Ambulated with decreased gait speed, decreased step length and height, increased B hip and knee flexion in stance, forward trunk lean, and downward head gaze. Provided verbal cues for erect posture, paced breathing, increased step height, and looking ahead.  Neuromuscular Re-ed: Patient performed the following breathing and standing balance activities: -diaphragmatic breathing with manual resistance, progressed to use of inceptive spirometer 2x10, provided multi-modal cues for activation of diaphragm -standing eyes close 2x20 sec with increased sway and min A x1 due to posterior LOB without UE support -staggered stance R 35 sec and L 26 sec before LOB without UE support -SLS 2-4 sec 5 trials each side with 0-1 UE support and cues for increased gluteal activation  Patient in recliner in room at end of session with breaks locked, seat belt alarm set, and all needs within reach. Patient had questions about need for life-long medication following this procedure. Reports that he has only ever taken natural supplements and that he has concerns about side effects and chemicals. Educated patient on general purpose and benefits of taking prescription medication, especially following heart surgery, however, encouraged him to discus medication concerns and goals with MD. Patient in agreement and receptive to all education.    Therapy Documentation Precautions:  Precautions Precautions: Sternal, Fall Precaution Comments: Reviewed sternal precautions Restrictions Weight Bearing Restrictions: Yes(sternal precautions)    Therapy/Group: Individual Therapy  Javius Sylla L Lindaann Gradilla PT, DPT  01/11/2020, 6:09 PM

## 2020-01-11 NOTE — IPOC Note (Signed)
Overall Plan of Care [facility]) Patient Details Name: Thermon Rammel MRN: [id number] DOB: [birth year]  Admitting Diagnosis: Debility  Hospital Problems: Principal Problem:   Debility Active Problems:   S/P CABG x 3     Functional Problem List: Nursing Edema, Endurance, Medication Management, Pain, Skin Integrity, Nutrition  PT Balance, Safety, Edema, Endurance, Motor, Nutrition, Pain, Skin Integrity  OT Balance, Behavior, Endurance, Motor, Pain, Safety  SLP    TR         Basic ADL's: OT Grooming, Bathing, Dressing, Toileting     Advanced  ADL's: OT       Transfers: PT Bed Mobility, Bed to Chair, Car, Manufacturing systems engineer, Metallurgist: PT Ambulation, Stairs     Additional Impairments: OT None  SLP        TR      Anticipated Outcomes Item Anticipated Outcome  Self Feeding n/a  Swallowing      Basic self-care  supervision  Toileting  supervision   Bathroom Transfers supervision  Bowel/Bladder  Supervision  Transfers  supervision  Locomotion  supervision using LRAD  Communication     Cognition     Pain  <3 on a 0-10 pain scale  Safety/Judgment  Supervision assist   Therapy Plan: PT Intensity: Minimum of 1-2 x/day ,45 to 90 minutes PT Frequency: 5 out of 7 days PT Duration Estimated Length of Stay: 10-14 days OT Intensity: Minimum of 1-2 x/day, 45 to 90 minutes OT Frequency: 5 out of 7 days OT Duration/Estimated Length of Stay: 10-14 days     Due to the current state of emergency, patients may not be receiving their 3-hours of Medicare-mandated therapy.   Team Interventions: Nursing Interventions Patient/Family Education, Pain Management, Medication Management, Discharge Planning, Skin Care/Wound Management, Psychosocial Support, Disease Management/Prevention  PT interventions Ambulation/gait training, Community reintegration, DME/adaptive equipment instruction, Neuromuscular re-education, Psychosocial support, Stair training, UE/LE  Strength taining/ROM, Training and development officer, Discharge planning, Pain management, Skin care/wound management, Therapeutic Activities, UE/LE Coordination activities, Disease management/prevention, Functional mobility training, Patient/family education, Splinting/orthotics, Therapeutic Exercise  OT Interventions Training and development officer, DME/adaptive equipment instruction, Patient/family education, Therapeutic Activities, Psychosocial support, Therapeutic Exercise, Community reintegration, Functional mobility training, Self Care/advanced ADL retraining, UE/LE Strength taining/ROM, Discharge planning, Neuromuscular re-education, UE/LE Coordination activities  SLP Interventions    TR Interventions    SW/CM Interventions Discharge Planning, Psychosocial Support, Patient/Family Education   Barriers to Discharge MD  Medical stability  Nursing Decreased caregiver support, Medical stability, Wound Care, Lack of/limited family support, Weight bearing restrictions    PT Inaccessible home environment, Decreased caregiver support, Home environment access/layout, Medical stability    OT Other (comments) none known at this time  SLP      SW       Team Discharge Planning: Destination: PT-Home ,OT- Home , SLP-  Projected Follow-up: PT-Home health PT, 24 hour supervision/assistance, OT-  Home health OT, 24 hour supervision/assistance, SLP-  Projected Equipment Needs: PT-To be determined, OT- Tub/shower bench, 3 in 1 bedside comode, SLP-  Equipment Details: PT- , OT-  Patient/family involved in discharge planning: PT- Patient,  OT-Patient, Family member/caregiver, SLP-   MD ELOS: 10-14 days Medical Rehab Prognosis:  Excellent Assessment: Mr. Schuerman course has been complicated by loose stooks from colchicine for his pericardial irritation. He is eager to participate in therapy and performing ADLs MinA. Ambulated 6- feet with CG with RW. Sleep impaired at night; increasing melatonin. Daughter  present during therapy sessions. Maintaining sternal precautions.    See Team  Conference Notes for weekly updates to the plan of care

## 2020-01-12 ENCOUNTER — Inpatient Hospital Stay (HOSPITAL_COMMUNITY): Payer: Medicare Other | Admitting: Physical Therapy

## 2020-01-12 ENCOUNTER — Inpatient Hospital Stay (HOSPITAL_COMMUNITY): Payer: Medicare Other | Admitting: Occupational Therapy

## 2020-01-12 ENCOUNTER — Inpatient Hospital Stay (HOSPITAL_COMMUNITY): Payer: Medicare Other

## 2020-01-12 LAB — CBC WITH DIFFERENTIAL/PLATELET
Abs Immature Granulocytes: 0.17 10*3/uL — ABNORMAL HIGH (ref 0.00–0.07)
Basophils Absolute: 0.1 10*3/uL (ref 0.0–0.1)
Basophils Relative: 0 %
Eosinophils Absolute: 0.2 10*3/uL (ref 0.0–0.5)
Eosinophils Relative: 1 %
HCT: 36.6 % — ABNORMAL LOW (ref 39.0–52.0)
Hemoglobin: 11.9 g/dL — ABNORMAL LOW (ref 13.0–17.0)
Immature Granulocytes: 1 %
Lymphocytes Relative: 5 %
Lymphs Abs: 1.1 10*3/uL (ref 0.7–4.0)
MCH: 33.2 pg (ref 26.0–34.0)
MCHC: 32.5 g/dL (ref 30.0–36.0)
MCV: 102.2 fL — ABNORMAL HIGH (ref 80.0–100.0)
Monocytes Absolute: 1.4 10*3/uL — ABNORMAL HIGH (ref 0.1–1.0)
Monocytes Relative: 7 %
Neutro Abs: 17.5 10*3/uL — ABNORMAL HIGH (ref 1.7–7.7)
Neutrophils Relative %: 86 %
Platelets: 347 10*3/uL (ref 150–400)
RBC: 3.58 MIL/uL — ABNORMAL LOW (ref 4.22–5.81)
RDW: 18.8 % — ABNORMAL HIGH (ref 11.5–15.5)
WBC: 20.3 10*3/uL — ABNORMAL HIGH (ref 4.0–10.5)
nRBC: 0.1 % (ref 0.0–0.2)

## 2020-01-12 LAB — EXPECTORATED SPUTUM ASSESSMENT W GRAM STAIN, RFLX TO RESP C: Special Requests: NORMAL

## 2020-01-12 LAB — BASIC METABOLIC PANEL
Anion gap: 10 (ref 5–15)
BUN: 15 mg/dL (ref 8–23)
CO2: 25 mmol/L (ref 22–32)
Calcium: 8.5 mg/dL — ABNORMAL LOW (ref 8.9–10.3)
Chloride: 109 mmol/L (ref 98–111)
Creatinine, Ser: 1.14 mg/dL (ref 0.61–1.24)
GFR calc Af Amer: >60 mL/min (ref >60)
GFR calc non Af Amer: >60 mL/min (ref >60)
Glucose, Bld: 138 mg/dL — ABNORMAL HIGH (ref 70–99)
Potassium: 3.9 mmol/L (ref 3.5–5.1)
Sodium: 144 mmol/L (ref 135–145)

## 2020-01-12 LAB — VITAMIN B12: Vitamin B-12: 1238 pg/mL — ABNORMAL HIGH (ref 180–914)

## 2020-01-12 MED ORDER — VANCOMYCIN HCL IN DEXTROSE 1-5 GM/200ML-% IV SOLN
1000.0000 mg | Freq: Once | INTRAVENOUS | Status: AC
  Administered 2020-01-12: 15:00:00 1000 mg via INTRAVENOUS
  Filled 2020-01-12: qty 200

## 2020-01-12 MED ORDER — SODIUM CHLORIDE 0.9 % IV SOLN
2.0000 g | Freq: Two times a day (BID) | INTRAVENOUS | Status: DC
  Administered 2020-01-12 – 2020-01-19 (×15): 2 g via INTRAVENOUS
  Filled 2020-01-12 (×18): qty 2

## 2020-01-12 MED ORDER — VANCOMYCIN HCL 750 MG/150ML IV SOLN
750.0000 mg | INTRAVENOUS | Status: DC
  Administered 2020-01-13 – 2020-01-16 (×4): 750 mg via INTRAVENOUS
  Filled 2020-01-12 (×5): qty 150

## 2020-01-12 NOTE — Progress Notes (Signed)
Social Work Patient ID: David Perez, male   DOB: [birth year], 79 y.o.   MRN: [id number]  Have reviewed team conference with pt and daughter, David Perez.  Both aware and agreeable with targeted d/c date of 2/19 and supervision/ mod ind goals overall.  Continue to follow.  David Huish, LCSW

## 2020-01-12 NOTE — Patient Care Conference (Signed)
Inpatient RehabilitationTeam Conference and Plan of Care Update Date: 01/12/2020   Time: 11:35 AM    Patient Name: David Perez      Medical Record Number: [id number]  Date of Birth: 1941-08-24 Sex: Male         Room/Bed: 4W03C/4W03C-01 Payor Info: Payor: MEDICARE / Plan: MEDICARE PART A AND B / Product Type: *No Product type* /    Admit Date/Time:  01/08/2020  6:08 PM  Primary Diagnosis:  Debility  Patient Active Problem List   Diagnosis Date Noted  . Debility 01/08/2020  . STEMI involving left main coronary artery ([place]) 12/31/2019  . STEMI (ST elevation myocardial infarction) ([place]) 12/31/2019  . 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 Courtney Heys Social Worker Present: Lennart Pall, LCSW Nurse Present: Other (comment)(Olivia Capranica, RN) Case Manager: Karene Fry, RN PT Present: Apolinar Junes, PT OT Present: Elisabeth Most, OT SLP Present: Jettie Booze, CF-SLP PPS Coordinator present : Ileana Ladd, Burna Mortimer, SLP     Current Status/Progress Goal Weekly Team Focus  Bowel/Bladder   Pt is continent of B/B, LBM 01/11/20  Pt will remain continent of b/b  Q2h/PRN toileting   Swallow/Nutrition/ Hydration             ADL's   UB adl min A, LB adl mod A, functional transfers min A/CGA  CS  adl and transfer training, DBT, energy conservation, sternal precautions with activity, balance and conditioning, family education   Mobility   Supervision/min A bed mobility, CGA sit<>stand and stand pivot w/o AD, gait 119ft CGA RW, 60 ft no AD, min A 4 steps using L HR  Currently supervision, will upgrade to mod I due to limited PRN assist at d/c  Endurance, 2 stairs with L UE support, standing balance, gait with LRAD, strengthening, and patient/caregiver education   Communication             Safety/Cognition/ Behavioral Observations            Pain   Pt denies pain at this time.  Pt c/o of "general discomfort"  Pt will be pain free  Assess pain qshift/prn   Skin   Pt has midline incision, hematoma to R hip, R thigh/buttocks abrasion.  Pt will remain free from infection and further skin breakdown.  Assess skin qshift/prn. Dressing changes as ordered.    Rehab Goals Patient on target to meet rehab goals: Yes *See Care Plan and progress notes for long and short-term goals.     Barriers to Discharge  Current Status/Progress Possible Resolutions Date Resolved   Nursing                  PT  Home environment access/layout;Lack of/limited family support;Weight bearing restrictions  Has Sternal precautions, 2 STE with L grab bar, only has limited PRN assistance from family at d/c.              OT Other (comments)  none known at this time             SLP                SW                Discharge Planning/Teaching Needs:  Pt to d/c to his home with daughters providing up to 24/7 supervision.  Teaching needs TBD   Team Discussion: Post op CABG, monitoring labs, WBC 18,  felt cold, doing work up.  RN cont B/B.  OT showered S bathing, min A dressing, CGA/min A transfers, following sternal precautions.  PT min/CGA transfers, amb 120' RW, fatigues, working on endurance, has 2 steps, have to practice steps, goals upgrade to mod I, Dtr to provide intermittent S.   Revisions to Treatment Plan: N/A     Medical Summary Current Status: continent; no wound issues; Weekly Focus/Goal: OT- therapy- asked ot take shower- supervision for bathing; min assist for dressing- follows sternal precautions  Barriers to Discharge: Medical stability;Other (comments);Home enviroment access/layout;Weight;Weight bearing restrictions  Barriers to Discharge Comments: frail; WBC 18000, Na 147 Possible Resolutions to Barriers: RW 167ft; gets somewhat SOB; weak endurance; biggest barrier 2 STE; daughter will be intermittent not 24/7; goals have to be mod I   Continued Need for Acute Rehabilitation  Level of Care: The patient requires daily medical management by a physician with specialized training in physical medicine and rehabilitation for the following reasons: Direction of a multidisciplinary physical rehabilitation program to maximize functional independence : Yes Medical management of patient stability for increased activity during participation in an intensive rehabilitation regime.: Yes Analysis of laboratory values and/or radiology reports with any subsequent need for medication adjustment and/or medical intervention. : Yes   I attest that I was present, lead the team conference, and concur with the assessment and plan of the team.   Retta Diones 01/12/2020, 7:58 PM   Team conference was held via web/ teleconference due to [place] - 19

## 2020-01-12 NOTE — Progress Notes (Signed)
Physical Therapy Session Note  Patient Details  Name: David Perez MRN: [id number] Date of Birth: [birth year]  Today's Date: 01/12/2020 PT Individual Time: 9937-1696 PT Individual Time Calculation (min): 25 min   Short Term Goals: Week 1:  PT Short Term Goal 1 (Week 1): Pt will perform functional transfers with CGA PT Short Term Goal 2 (Week 1): Pt will ambulate at least 3f with CGA using LRAD PT Short Term Goal 3 (Week 1): Pt will ascend/descend 4 steps using L HR only with CGA PT Short Term Goal 4 (Week 1): Pt will demonstrate improvement by at least 667fon the 6MWT  Skilled Therapeutic Interventions/Progress Updates: Pt presented in w/c agreeable to therapy. Pt denies pain but states significant fatigue from day's activities. Agreeable to ambulation with RW for energy conservation. Performed STS CGA from w/c placing hands on legs. Pt ambulated approx 10019fith RW and CGA with w/c follow. Pt returned to recliner and participated in IS x 10 avg 750m29mth shortened inhalation on last few reps. Pt educated and performed diaphragmatic and costal breathing and education on hand placement for biofeedback x 10 ea. Pt then performed scapular retraction, shoulder shrugs, and circles x 10 with edu on posture and facilitation of breathing. Pt agreeable to remain in recliner as concerned will sleep heavily if goes back to bed. Pt remained in recliner at end of session with belt alarm on, call bell within reach and needs met.       Therapy Documentation Precautions:  Precautions Precautions: Sternal, Fall Precaution Comments: Reviewed sternal precautions Restrictions Weight Bearing Restrictions: Yes(sternal precautions) General: PT Amount of Missed Time (min): 30 Minutes Vital Signs: Therapy Vitals Temp: 98.2 F (36.8 C) Pulse Rate: 60 Resp: 18 BP: 111/61 Patient Position (if appropriate): Sitting Oxygen Therapy SpO2: 96 % O2 Device: Room Air Pain: Pain Assessment Pain Scale: 0-10 Pain  Score: 0-No pain    Therapy/Group: Individual Therapy  Carlyle Achenbach  Randal Yepiz, PTA  01/12/2020, 3:56 PM

## 2020-01-12 NOTE — Progress Notes (Signed)
Physical Therapy Session Note  Patient Details  Name: David Perez MRN: [id number] Date of Birth: 03-27-1941  Today's Date: 01/12/2020 PT Individual Time: 1305-1320 and 1350-1420 PT Individual Time Calculation (min): 15 min and 30 min   Short Term Goals: Week 1:  PT Short Term Goal 1 (Week 1): Pt will perform functional transfers with CGA PT Short Term Goal 2 (Week 1): Pt will ambulate at least 46ft with CGA using LRAD PT Short Term Goal 3 (Week 1): Pt will ascend/descend 4 steps using L HR only with CGA PT Short Term Goal 4 (Week 1): Pt will demonstrate improvement by at least 60ft on the 6MWT  Skilled Therapeutic Interventions/Progress Updates:     Patient in w/c eating lunch with his daughter in the room upon PT arrival. Patient alert and agreeable to PT session. Patient denied pain during session. He had questions about lab tests for pneumonia and expressed concerns for his progress with therapy. Educated on benefits of therapy during recovery and encouraged sitting OOB and activity as tolerance. Also encouraged coughing with use of heart pillow with emphasis on productive coughing. He did have 1 productive cough using the pillow. He also expressed concerns about cleaning his incentive spirometer.Educated on cleaning and plan to have the patient clean the IS during session. Then discussed lack of sleep last night due to feeling cold, informed him that nursing is aware and plan to provide additional blankets this evening. He then became frustrated about eating lunch during therapy time, stated this happened during breakfast this morning. Educated on importance of nutrition and recovery and agreed to hold therapy session until he had finished eating. Patient missed 30 min of this PT session due to eating lunch. Per Erline Levine, OT, he did participate in an additional 30 min of skilled therapy this morning and there is no need to make up missed time.   Upon PT return, patient in w/c finished eating lunch  with his daughter in the room upon PT arrival. Patient alert and agreeable to PT session. Patient denied pain during session. Patient's daughter left at beginning of session. He reported that the nurse asked for a sputum sample and requested to utilize the incentive spirometer. PT demonstrated washing and drying the mouth piece, allowing it to dry completely before use. He then performed 2x10 breaths using the incentive spirometer with cues for diaphragmatic breathing and holding the breath on inhale 2-3 sec as able. Able to reach 1000 mark x3. He had some dry coughing after, but no productive cough initially. He then requested to use the bathroom. He ambulated ~12 ft to/from the bathroom with CGA-min A without an AD with intermittent reaching for furniture or the wall for stabilization. He was continent of bladder and bowl on the toilet. He was independent with peri-care and required supervision for LB dressing and balance during transfers. Independently placed hands on thighs with all transfers to maintain sternal precautions. He then ambulated ~55 ft with min A-CGA for safety/balance, and was able to self initiate when to turn back to the room based on his energy level. Reported 6/10 RPE after. He was able to produce a productive cough in sitting using his heart pillow over his chest and cues for increased activation of his diaphragm, RN made aware. Educated on benefits of productive cough and use of incentive spirometer and heart pillow. He was receptive and appreciative of all education throughout session. Patient in w/c in room at end of session with breaks locked, seat belt alarm set,  and all needs within reach. He continues to required increased time and rest breaks with minimal activity due to SOB. SPO2 >93% throughout session on RA.   Therapy Documentation Precautions:  Precautions Precautions: Sternal, Fall Precaution Comments: Reviewed sternal precautions Restrictions Weight Bearing  Restrictions: Yes(sternal precautions) General: PT Amount of Missed Time (min): 30 Minutes    Therapy/Group: Individual Therapy  Sayla Golonka L Estelene Carmack PT, DPT  01/12/2020, 3:13 PM

## 2020-01-12 NOTE — Progress Notes (Signed)
Pharmacy Antibiotic Note  David Perez is a 79 y.o. male admitted on 01/08/2020 with pneumonia.  Pharmacy has been consulted for vancomycin/cefepime dosing. Previously completed 5 days of cefepime on 2/4 for aspiration PNA. Afebrile, WBC up to 20.3. SCr currently stable at 1.14.  Plan: Cefepime 2g IV q12h Vancomycin 1g IV x1; then Vancomycin 750 mg IV Q 24 hrs. Goal AUC 400-550. Expected AUC: 528 SCr used: 1.14 Monitor clinical progress, c/s, renal function F/u de-escalation plan/LOT, vancomycin levels as indicated   Height: 5\' 8"  (172.7 cm) Weight: 118 lb 9.7 oz (53.8 kg) IBW/kg (Calculated) : 68.4  Temp (24hrs), Avg:98.4 F (36.9 C), Min:97.9 F (36.6 C), Max:98.8 F (37.1 C)  Recent Labs  Lab 01/07/20 0503 01/08/20 0255 01/09/20 1205 01/11/20 0503 01/12/20 0939  WBC 16.2* 16.5* 14.0* 18.1* 20.3*  CREATININE 1.27* 1.20 1.23 1.02 1.14    Estimated Creatinine Clearance: 39.3 mL/min (by C-G formula based on SCr of 1.14 mg/dL).    No Known Allergies  Antimicrobials this admission: 2/9 vancomycin >>  2/9 cefepime >>   Dose adjustments this admission:   Microbiology results:   Elicia Lamp, PharmD, BCPS Clinical Pharmacist 01/12/2020 1:04 PM

## 2020-01-12 NOTE — Progress Notes (Signed)
Pt concerned about his BP being much lower than his baseline. Pt requesting the provider be notified of concerns.

## 2020-01-12 NOTE — Progress Notes (Signed)
Occupational Therapy Session Note  Patient Details  Name: David Perez MRN: [id number] Date of Birth: 1941-03-26  Today's Date: 01/12/2020 OT Individual Time: 0830-0930 OT Individual Time Calculation (min): 60 min    Short Term Goals: Week 1:  OT Short Term Goal 1 (Week 1): Pt will perform UB dressing with min A and min cuing to maintain precautions. OT Short Term Goal 2 (Week 1): Pt will perform bathing tasks with min A overall. OT Short Term Goal 3 (Week 1): PT will perform LB dressing with min A overall.  Skilled Therapeutic Interventions/Progress Updates:    Patient seated in recliner, states that he is ready for therapy session.   Sit to stand and SPT from recliner with CGA - good carryover of sternal precautions.  Completed grooming tasks seated at sink with CS, min cues.  SPT to/from shower bench with CGA, completed shower with CS and cues for safety.   Dressing tasks completed w/c level with min A for OH shirt and min A for pants over feet and clothing management in stance.  Max A to [employer] and sneakers due to edema.  increased time needed between tasks due to fatigue.  Patient remained seated in w/c at close of session, seat belt alarm set and call bell in reach.    Therapy Documentation Precautions:  Precautions Precautions: Sternal, Fall Precaution Comments: Reviewed sternal precautions Restrictions Weight Bearing Restrictions: Yes(sternal precautions) General:   Vital Signs:  Pain: Pain Assessment Pain Scale: 0-10 Pain Score: 0-No pain   Other Treatments:     Therapy/Group: Individual Therapy  [name] 01/12/2020, 12:05 PM

## 2020-01-12 NOTE — Progress Notes (Signed)
Occupational Therapy Session Note  Patient Details  Name: David Perez MRN: [id number] Date of Birth: 08-03-1941  Today's Date: 01/12/2020 OT Individual Time: 1004-1058 OT Individual Time Calculation (min): 54 min    Short Term Goals: Week 1:  OT Short Term Goal 1 (Week 1): Pt will perform UB dressing with min A and min cuing to maintain precautions. OT Short Term Goal 2 (Week 1): Pt will perform bathing tasks with min A overall. OT Short Term Goal 3 (Week 1): PT will perform LB dressing with min A overall.  Skilled Therapeutic Interventions/Progress Updates:    Treatment session with focus on activity tolerance, sit > stand, and dynamic standing balance during self-care tasks.  Pt received upright in w/c reporting not feeling great.  Pt reports increase in WBC and not sleeping well overnight as he had been so cold. Pt reports need to void, requesting to stand to use urinal. Pt completed sit > stand with CGA and min assist due to initial instability once upright.  Voided in standing with CGA.  Pt then reports need to have BM, therefore BSC brought up behind pt.  Pt able to complete hygiene and clothing management with CGA.  Pt deferred additional standing due to "all that is going on" and requested to focus on seated exercises.  Engaged in toe taps and knee and hip flexion/extension in sitting as well as chest presses and bicep curls with towel (limiting ranger to <90* shoulder flexion).  Pt able to report understanding of sternal precautions.  Pt's daughter arrived during session, reiterated sternal precautions during self-care tasks and exercises.  Discussed focus on quality of movements for endurance and strengthening.  Pt remained upright in w/c with all needs in reach and seat belt alarm on.  Therapy Documentation Precautions:  Precautions Precautions: Sternal, Fall Precaution Comments: Reviewed sternal precautions Restrictions Weight Bearing Restrictions: Yes(sternal  precautions) General:   Vital Signs: Therapy Vitals Temp: 98.2 F (36.8 C) Pulse Rate: 60 Resp: 18 BP: 111/61 Patient Position (if appropriate): Sitting Oxygen Therapy SpO2: 96 % O2 Device: Room Air Pain: Pain Assessment Pain Scale: 0-10 Pain Score: 0-No pain   Therapy/Group: Individual Therapy  Simonne Come 01/12/2020, 3:07 PM

## 2020-01-12 NOTE — Progress Notes (Addendum)
[facility] PHYSICAL MEDICINE & REHABILITATION PROGRESS NOTE   Subjective/Complaints: Denies pain. Moving bowels less frequently- was having loose stools Slept poorly last night- due to cold room.    ROS: pt denies cardiac CP, SOB, abd pain, HA, vision changes, mood changes, N/V/C however does c/o loose stools due to colchicine  Objective:   No results found. Recent Labs    01/09/20 1205 01/11/20 0503  WBC 14.0* 18.1*  HGB 11.2* 11.3*  HCT 34.6* 34.1*  PLT 215 270   Recent Labs    01/09/20 1205 01/11/20 0503  NA 145 147*  K 3.9 3.8  CL 116* 118*  CO2 19* 21*  GLUCOSE 146* 104*  BUN 23 14  CREATININE 1.23 1.02  CALCIUM 8.4* 8.2*    Intake/Output Summary (Last 24 hours) at 01/12/2020 0920 Last data filed at 01/12/2020 0750 Gross per 24 hour  Intake 480 ml  Output 500 ml  Net -20 ml     Physical Exam: Vital Signs Blood pressure (!) 109/58, pulse 61, temperature 98.4 F (36.9 C), temperature source Oral, resp. rate 18, height 5\' 8"  (1.727 m), weight 53.8 kg, SpO2 92 %.  Physical Exam  General: Alert and oriented x 3, No apparent distress. Sitting up in chair, pleasantl belt on for safety, NAD HEENT: conjugate gaze; trachea midline Neck: Supple with no JVD Heart: Reg rate and rhythm. Chest: CTA bilaterally without wheezes, rales, or rhonchi; no distress; sounds well Abdomen: Soft, non-tender, non-distended, (+) BS Extremities: No clubbing, cyanosis, or edema. Pulses are 2+ Skin: Surgical incision on chest clean and intact without signs of breakdown. Some tenderness to palpation over chest wall around incision site- site of pt's noncardiac chest pain which has resolved Neuro: Pt is cognitively appropriate with normal insight, memory, and awareness. Cranial nerves 2-12 are intact. Sensory exam is normal. Reflexes are 2+ in all 4's. Fine motor coordination is intact. No tremors. Motor function is grossly 5/5.  Musculoskeletal: Full ROM, No pain with AROM or PROM in the  neck, trunk, or extremities. Posture appropriate Psych: Pt's affect is appropriate. Pt is cooperative   Assessment/Plan: 1. 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  Physiatrist is providing close team supervision and 24 hour management of active medical problems listed below.  Physiatrist and rehab team continue to assess barriers to discharge/monitor patient progress toward functional and medical goals  Care Tool:  Bathing    Body parts bathed by patient: Buttocks, Front perineal area   Body parts bathed by helper: Chest, Abdomen, Right lower leg, Left lower leg     Bathing assist Assist Level: Supervision/Verbal cueing     Upper Body Dressing/Undressing Upper body dressing Upper body dressing/undressing activity did not occur (including orthotics): N/A What is the patient wearing?: Hospital gown only    Upper body assist Assist Level: Set up assist    Lower Body Dressing/Undressing Lower body dressing      What is the patient wearing?: Incontinence brief     Lower body assist Assist for lower body dressing: Minimal Assistance - Patient > 75%     Toileting Toileting    Toileting assist Assist for toileting: Minimal Assistance - Patient > 75%     Transfers Chair/bed transfer  Transfers assist     Chair/bed transfer assist level: Minimal Assistance - Patient > 75%     Locomotion Ambulation   Ambulation assist  Assist level: Minimal Assistance - Patient > 75% Assistive device: No Device Max distance: 60   Walk 10 feet activity   Assist     Assist level: Minimal Assistance - Patient > 75% Assistive device: No Device   Walk 50 feet activity   Assist    Assist level: Minimal Assistance - Patient > 75% Assistive device: No Device    Walk 150 feet activity   Assist Walk 150 feet activity did not occur:  Safety/medical concerns         Walk 10 feet on uneven surface  activity   Assist     Assist level: Minimal Assistance - Patient > 75%     Wheelchair     Assist Will patient use wheelchair at discharge?: No(unless needed for dependent transport in community due to limited ambulation distance)             Wheelchair 50 feet with 2 turns activity    Assist            Wheelchair 150 feet activity     Assist          Blood pressure (!) 109/58, pulse 61, temperature 98.4 F (36.9 C), temperature source Oral, resp. rate 18, height 5\' 8"  (1.727 m), weight 53.8 kg, SpO2 92 %.  Medical Problem List and Plan: 1.  Impaired mobility and ADLs secondary to cardiac debility s/p CABGx3.             -patient may shower             -ELOS/Goals: modI in PT, OT, I in SLP             -2 weeks colchicine for pericardial irritation  -Continue CIR PT and OT 2.  Antithrombotics: -DVT/anticoagulation: Starting Eliquis today (2/5)              -antiplatelet therapy: On ASA 3. Pain Management: Denies pain, not receiving any pain medications.  4. Mood: LCSW to follow for evaluations and support.              -antipsychotic agents:  N/A 5. Neuropsych: This patient is capable of making decisions on his own behalf. 6. Skin/Wound Care: Monitor incisions for healing.  7. Fluids/Electrolytes/Nutrition: Offer supplements between meals to promote wound healing.  8. AFib: Continue to monitor HR tid and with increase in activity. Continue Amiodarone bid 9. Persistent Leucocytosis: WBC up to 16,000 +--infectious work up negative s/p IV Cefepime.   2/6- WBC down to 14k- afebrile since admission- will con't to monitor  2/8: Up to 18K. Afebrile, continue to monitor.  2/9- Will recheck labs today; also check U/A and Cx as well as CXR to figure out why so elevated.    2/9- has to get sputum Cx prior to starting IV Vanc and Cefipime for presumed Pneumonia, based on CXR results- pt reports  never had heme issues of any kind and rarely runs a fever, even when ill.   10 Acute renal failure with hypernatremia: Need to encourage fluid intake.   2.6- Cr down to 1.23 and BUN down to 23- recovering- Na 145- so improving- con't regimen  2/8: Na increased to 147.  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 On ASA, Losartan, digoxin, coreg, Lipitor.   Filed Weights   01/10/20 0443 01/11/20 0520 01/12/20 0455  Weight: 57.8 kg 52.2 kg 53.8 kg    2/6- Weight down 2kg in 1 day- will montor for  trend  2/7- Weight down another 1 kg- labs in AM  2/8: weight decreased.  2.9- Weight 53.8 kg  12. Thrombocytopenia: Has resolved.  13. Shortness of breath: Encourage IS. Continue  14. Constipation: Resolved. Can DC daily docusate, miralax, and dulcolax. Patient is refusing and moving bowels regularly.   2/6- having loose stools- due to colchicine likely- will d/c Colace and add Fibercon daily  2/7- improved per pt.   2/8: continues to improve.  15. Hypernatremia  2/9- was 147 yesterday- will recheck today and start IVFs if needed.   - is 144 today 16.  Disposition: Lives alone, but has supportive family, including daughter, nearby.      LOS: 4 days A FACE TO FACE EVALUATION WAS PERFORMED  [place] Record 01/12/2020, 9:20 AM

## 2020-01-12 NOTE — Plan of Care (Signed)
  Problem: Consults Goal: RH GENERAL PATIENT EDUCATION Description: See Patient Education module for education specifics. Outcome: Progressing Goal: Skin Care Protocol Initiated - if Braden Score 18 or less Description: If consults are not indicated, leave blank or document N/A Outcome: Progressing   Problem: RH SKIN INTEGRITY Goal: RH STG MAINTAIN SKIN INTEGRITY WITH ASSISTANCE Description: STG Maintain Skin Integrity With min Assistance. Outcome: Progressing Goal: RH STG ABLE TO PERFORM INCISION/WOUND CARE W/ASSISTANCE Description: STG Able To Perform Incision/Wound Care With min Assistance. Outcome: Progressing   Problem: RH SAFETY Goal: RH STG ADHERE TO SAFETY PRECAUTIONS W/ASSISTANCE/DEVICE Description: STG Adhere to Safety Precautions With min Assistance and appropriate assistive Device. Outcome: Progressing   Problem: RH PAIN MANAGEMENT Goal: RH STG PAIN MANAGED AT OR BELOW PT'S PAIN GOAL Description: <3 on a 0-10 pain scale Outcome: Progressing   Problem: RH KNOWLEDGE DEFICIT GENERAL Goal: RH STG INCREASE KNOWLEDGE OF SELF CARE AFTER HOSPITALIZATION Description: Patient will demonstrate knowledge of medication management, incision site care, and follow up care with the MD post discharge with min assist from rehab staff. Outcome: Progressing

## 2020-01-13 ENCOUNTER — Inpatient Hospital Stay (HOSPITAL_COMMUNITY): Payer: Medicare Other

## 2020-01-13 ENCOUNTER — Inpatient Hospital Stay (HOSPITAL_COMMUNITY): Payer: Medicare Other | Admitting: Physical Therapy

## 2020-01-13 ENCOUNTER — Inpatient Hospital Stay (HOSPITAL_COMMUNITY): Payer: Medicare Other | Admitting: Occupational Therapy

## 2020-01-13 LAB — COMPREHENSIVE METABOLIC PANEL
ALT: 120 U/L — ABNORMAL HIGH (ref 0–44)
AST: 49 U/L — ABNORMAL HIGH (ref 15–41)
Albumin: 2.5 g/dL — ABNORMAL LOW (ref 3.5–5.0)
Alkaline Phosphatase: 72 U/L (ref 38–126)
Anion gap: 8 (ref 5–15)
BUN: 14 mg/dL (ref 8–23)
CO2: 23 mmol/L (ref 22–32)
Calcium: 8.3 mg/dL — ABNORMAL LOW (ref 8.9–10.3)
Chloride: 113 mmol/L — ABNORMAL HIGH (ref 98–111)
Creatinine, Ser: 1.06 mg/dL (ref 0.61–1.24)
GFR calc Af Amer: >60 mL/min (ref >60)
GFR calc non Af Amer: >60 mL/min (ref >60)
Glucose, Bld: 115 mg/dL — ABNORMAL HIGH (ref 70–99)
Potassium: 3.9 mmol/L (ref 3.5–5.1)
Sodium: 144 mmol/L (ref 135–145)
Total Bilirubin: 2.7 mg/dL — ABNORMAL HIGH (ref 0.3–1.2)
Total Protein: 5.2 g/dL — ABNORMAL LOW (ref 6.5–8.1)

## 2020-01-13 LAB — CBC WITH DIFFERENTIAL/PLATELET
Abs Immature Granulocytes: 0.18 10*3/uL — ABNORMAL HIGH (ref 0.00–0.07)
Basophils Absolute: 0.1 10*3/uL (ref 0.0–0.1)
Basophils Relative: 0 %
Eosinophils Absolute: 0.3 10*3/uL (ref 0.0–0.5)
Eosinophils Relative: 2 %
HCT: 32.8 % — ABNORMAL LOW (ref 39.0–52.0)
Hemoglobin: 10.6 g/dL — ABNORMAL LOW (ref 13.0–17.0)
Immature Granulocytes: 1 %
Lymphocytes Relative: 5 %
Lymphs Abs: 1.1 10*3/uL (ref 0.7–4.0)
MCH: 32.5 pg (ref 26.0–34.0)
MCHC: 32.3 g/dL (ref 30.0–36.0)
MCV: 100.6 fL — ABNORMAL HIGH (ref 80.0–100.0)
Monocytes Absolute: 1.4 10*3/uL — ABNORMAL HIGH (ref 0.1–1.0)
Monocytes Relative: 7 %
Neutro Abs: 17.1 10*3/uL — ABNORMAL HIGH (ref 1.7–7.7)
Neutrophils Relative %: 85 %
Platelets: 311 10*3/uL (ref 150–400)
RBC: 3.26 MIL/uL — ABNORMAL LOW (ref 4.22–5.81)
RDW: 18.8 % — ABNORMAL HIGH (ref 11.5–15.5)
WBC: 20.1 10*3/uL — ABNORMAL HIGH (ref 4.0–10.5)
nRBC: 0 % (ref 0.0–0.2)

## 2020-01-13 LAB — MRSA PCR SCREENING: MRSA by PCR: NEGATIVE

## 2020-01-13 NOTE — Progress Notes (Signed)
Occupational Therapy Session Note  Patient Details  Name: [name] Ascolese MRN: [id number] Date of Birth: [birth year]  Today's Date: 01/13/2020 OT Individual Time: [id number] OT Individual Time Calculation (min): 15 min  and Today's Date: 01/13/2020 OT Missed Time: 48 Minutes Missed Time Reason: Patient unwilling/refused to participate without medical reason   Short Term Goals: Week 1:  OT Short Term Goal 1 (Week 1): Pt will perform UB dressing with min A and min cuing to maintain precautions. OT Short Term Goal 2 (Week 1): Pt will perform bathing tasks with min A overall. OT Short Term Goal 3 (Week 1): PT will perform LB dressing with min A overall.  Skilled Therapeutic Interventions/Progress Updates:    Upon entering the room, pt seated in recliner chair with breakfast tray and eating. Pt did not realize therapy scheduled for 7 AM session and wanting to finish breakfast. OT reviewed today's scheduled therapy with him and pt requesting to not be seen this early in the morning. Pt not wanting to engage further as he wants to "eat my food without having to talk to anyone". Pt declined further OT intervention this session but is agreeable to next schedule session. Call bell and all needed items within reach upon exiting the room.   Therapy Documentation Precautions:  Precautions Precautions: Sternal, Fall Precaution Comments: Reviewed sternal precautions Restrictions Weight Bearing Restrictions: Yes(sternal precautions) General: General OT Amount of Missed Time: 45 Minutes Vital Signs: Therapy Vitals Temp: 98.2 F (36.8 C) Pulse Rate: 64 BP: 124/60 Patient Position (if appropriate): Lying Oxygen Therapy SpO2: 90 % O2 Device: Room Air   Therapy/Group: Individual Therapy  Gypsy Decant 01/13/2020, 7:20 AM

## 2020-01-13 NOTE — Plan of Care (Signed)
  Problem: Consults Goal: RH GENERAL PATIENT EDUCATION Description: See Patient Education module for education specifics. Outcome: Progressing Goal: Skin Care Protocol Initiated - if Braden Score 18 or less Description: If consults are not indicated, leave blank or document N/A Outcome: Progressing   Problem: RH SKIN INTEGRITY Goal: RH STG MAINTAIN SKIN INTEGRITY WITH ASSISTANCE Description: STG Maintain Skin Integrity With min Assistance. Outcome: Progressing Goal: RH STG ABLE TO PERFORM INCISION/WOUND CARE W/ASSISTANCE Description: STG Able To Perform Incision/Wound Care With min Assistance. Outcome: Progressing   Problem: RH SAFETY Goal: RH STG ADHERE TO SAFETY PRECAUTIONS W/ASSISTANCE/DEVICE Description: STG Adhere to Safety Precautions With min Assistance and appropriate assistive Device. Outcome: Progressing   Problem: RH PAIN MANAGEMENT Goal: RH STG PAIN MANAGED AT OR BELOW PT'S PAIN GOAL Description: <3 on a 0-10 pain scale Outcome: Progressing   Problem: RH KNOWLEDGE DEFICIT GENERAL Goal: RH STG INCREASE KNOWLEDGE OF SELF CARE AFTER HOSPITALIZATION Description: Patient will demonstrate knowledge of medication management, incision site care, and follow up care with the MD post discharge with min assist from rehab staff. Outcome: Progressing

## 2020-01-13 NOTE — Progress Notes (Signed)
Physical Therapy Session Note  Patient Details  Name: David Perez MRN: [id number] Date of Birth: November 29, 1941  Today's Date: 01/13/2020 PT Individual Time: 1430-1530 PT Individual Time Calculation (min): 60 min   Short Term Goals: Week 1:  PT Short Term Goal 1 (Week 1): Pt will perform functional transfers with CGA PT Short Term Goal 2 (Week 1): Pt will ambulate at least 32ft with CGA using LRAD PT Short Term Goal 3 (Week 1): Pt will ascend/descend 4 steps using L HR only with CGA PT Short Term Goal 4 (Week 1): Pt will demonstrate improvement by at least 17ft on the 6MWT  Skilled Therapeutic Interventions/Progress Updates:     Patient in recliner in room upon PT arrival. Patient alert and agreeable to PT session. Patient denied pain during session, however, reported increased fatigue and congestion today. Discussed change in medical status and purpose of labs and imaging ordered at beginning of session. Patient reported that he liked using the Flutter valve and had more productive coughing after using it. Patient used the flutter valve 2x2 min during session and had several productive coughs using his heart pillow. Educated on technique to use his diaphragm to promote a stronger cough.   Therapeutic Activity: Transfers: Patient performed sit to/from stand x4 with supervision and 2 attempts to stand x2 trials due to decreased forward weight shift. Provided verbal cues for scooting to the edge of the chair, placing feet underneath his body, and forward weight shift to stand. Performed all transfers with his hands on his thighs to maintain sternal precautions.  Gait Training:  Patient ambulated ~70 feet holding an IV pole and ~50 feet using a RW with CGA. Ambulated with decreased gait speed, decreased step length and height, increased B hip and knee flexion in stance, forward trunk lean, and downward head gaze. Provided verbal cues for erect posture, paced breathing, and increased step height for  safety.  Neuromuscular Re-ed: Patient performed the following activities for motor learning with a functional task: Sit to/from stand 3x10 with cues and manual facilitation for forward weigh shift. Performed multiple repetitions to promote carry-over into functional mobility throughout the day. Patient was very motivated to work on this task. He did require several rest breaks throughout to recover due to fatigue.  Patient in recliner in room at end of session with breaks locked, seat belt alarm set, and all needs within reach.    Therapy Documentation Precautions:  Precautions Precautions: Sternal, Fall Precaution Comments: Reviewed sternal precautions Restrictions Weight Bearing Restrictions: Yes(Sternal )    Therapy/Group: Individual Therapy  Amariyah Bazar L Rochele Lueck PT, DPT  01/13/2020, 4:34 PM

## 2020-01-13 NOTE — Consult Note (Signed)
Cardiac Surgery Follow-up  Pt well-known to me after recent admit for urgent CABG/AVR. Now with leukocytosis. Would suggest chest CT to eval pulmonary parenchyma or pleural process. Incisions look fine.  Will follow up.  Demarques Pilz Z. Orvan Seen, [place]

## 2020-01-13 NOTE — Progress Notes (Signed)
Occupational Therapy Session Note  Patient Details  Name: Equan Cogbill MRN: [id number] Date of Birth: June 14, 1941  Today's Date: 01/13/2020 OT Individual Time: 3383-2919 OT Individual Time Calculation (min): 26 min    Short Term Goals: Week 1:  OT Short Term Goal 1 (Week 1): Pt will perform UB dressing with min A and min cuing to maintain precautions. OT Short Term Goal 2 (Week 1): Pt will perform bathing tasks with min A overall. OT Short Term Goal 3 (Week 1): PT will perform LB dressing with min A overall.  Skilled Therapeutic Interventions/Progress Updates:    1:1. Pt received in recliner reporting need to toilet. Pt completes stand pivot transfer with MIN A for steadying. Pt with good anterior weight shift however pt throwing head back causing backwards posterior bias. Pt completes toileting with CGA for Cm and S for toileting. Pt completes sit to stand from recliner with gaze stabilziation technique with vision on floor out in front of pt pt able to stand with S and ambualte with CGA to bathroom to transfer onto [facility] over toilet for practice. tp able to carryover on sit to stand back to toilet. Exited session with pt seated in recliner call light in reach and all needds met  Therapy Documentation Precautions:  Precautions Precautions: Sternal, Fall Precaution Comments: Reviewed sternal precautions Restrictions Weight Bearing Restrictions: Yes(Sternal ) General: General OT Amount of Missed Time: 45 Minutes Vital Signs:   Pain: Pain Assessment Pain Scale: 0-10 Pain Score: 0-No pain ADL:   Vision   Perception    Praxis   Exercises:   Other Treatments:     Therapy/Group: Individual Therapy  Tonny Branch 01/13/2020, 9:59 AM

## 2020-01-13 NOTE — Progress Notes (Signed)
[facility] PHYSICAL MEDICINE & REHABILITATION PROGRESS NOTE   Subjective/Complaints:  Pt reports cough is better and can talk better/voice is better.  Feels overall better this AM.  WBC 20.1- only slightly down with Vanc/Cefipime- Per Cards, suggested a Chest CT to evaluate leukocytosis.  Will order and f/u.   ROS: pt denies cardiac CP, SOB, abd pain, HA, vision changes, mood changes, N/V/C however does c/o loose stools due to colchicine  Objective:   DG CHEST PORT 1 VIEW  Result Date: 01/12/2020 CLINICAL DATA:  Leukocytosis EXAM: PORTABLE CHEST - 1 VIEW COMPARISON:  01/07/2020 FINDINGS: Pleural effusions and worsening consolidation/atelectasis in the lung bases, left greater than right, increased since previous exam. Some increase in central pulmonary vascular congestion and probable mild interstitial edema. Heart size upper limits normal for technique. Previous CABG and AVR. No pneumothorax. Sternotomy wires. IMPRESSION: Worsening bibasilar consolidation/atelectasis and effusions, left greater than right. Electronically Signed   By: Lucrezia Europe M.D.   On: 01/12/2020 11:13   Recent Labs    01/12/20 0939 01/13/20 0539  WBC 20.3* 20.1*  HGB 11.9* 10.6*  HCT 36.6* 32.8*  PLT 347 311   Recent Labs    01/12/20 0939 01/13/20 0539  NA 144 144  K 3.9 3.9  CL 109 113*  CO2 25 23  GLUCOSE 138* 115*  BUN 15 14  CREATININE 1.14 1.06  CALCIUM 8.5* 8.3*    Intake/Output Summary (Last 24 hours) at 01/13/2020 1444 Last data filed at 01/13/2020 0600 Gross per 24 hour  Intake 100 ml  Output 300 ml  Net -200 ml     Physical Exam: Vital Signs Blood pressure (!) 128/59, pulse 62, temperature (!) 97.4 F (36.3 C), resp. rate 18, height 5\' 8"  (1.727 m), weight 57.5 kg, SpO2 96 %.  Physical Exam  General: Alert and oriented x 3, No apparent distress. In bedside chair; no coughing; voice less hoarse- more normal; wearing belt; NAD HEENT: conjugate gaze; trachea midline Neck: Supple  with no JVD Heart: Reg rate and rhythm. Incision on chest looks great- healing well- no infection Chest: good air movement, CTA B/L- no W/R/R; no coughing with deep breath- not decreased at bases Abdomen: Soft, non-tender, non-distended, (+) BS Extremities: No clubbing, cyanosis, or edema. Pulses are 2+ Skin: Surgical incision on chest clean and intact without signs of breakdown as above Some tenderness to palpation over chest wall around incision site- Neuro: Pt is cognitively appropriate with normal insight, memory, and awareness.  Sensory exam is normal. Reflexes are 2+ in all 4's. Fine motor coordination is intact. No tremors. Motor function is grossly 5/5.  Musculoskeletal: Full ROM, No pain with AROM or PROM in the neck, trunk, or extremities. Posture appropriate Psych: pt affect is flat, and guarded.    Assessment/Plan: 1. 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  Physiatrist is providing close team supervision and 24 hour management of active medical problems listed below.  Physiatrist and rehab team continue to assess barriers to discharge/monitor patient progress toward functional and medical goals  Care Tool:  Bathing    Body parts bathed by patient: Right arm, Left arm, Chest, Abdomen, Front perineal area, Buttocks, Right upper leg, Left upper leg, Right lower leg, Left lower leg, Face   Body parts bathed by helper: Chest, Abdomen, Right lower leg, Left lower leg     Bathing assist Assist Level: Minimal Assistance -  Patient > 75%     Upper Body Dressing/Undressing Upper body dressing Upper body dressing/undressing activity did not occur (including orthotics): N/A What is the patient wearing?: Pull over shirt    Upper body assist Assist Level: Minimal Assistance - Patient > 75%    Lower Body Dressing/Undressing Lower body dressing      What is the  patient wearing?: Incontinence brief, Pants     Lower body assist Assist for lower body dressing: Minimal Assistance - Patient > 75%     Toileting Toileting    Toileting assist Assist for toileting: Contact Guard/Touching assist     Transfers Chair/bed transfer  Transfers assist     Chair/bed transfer assist level: Minimal Assistance - Patient > 75%     Locomotion Ambulation   Ambulation assist      Assist level: Contact Guard/Touching assist Assistive device: Walker-rolling Max distance: 110 ft   Walk 10 feet activity   Assist     Assist level: Contact Guard/Touching assist Assistive device: Walker-rolling   Walk 50 feet activity   Assist    Assist level: Contact Guard/Touching assist Assistive device: Walker-rolling    Walk 150 feet activity   Assist Walk 150 feet activity did not occur: Safety/medical concerns         Walk 10 feet on uneven surface  activity   Assist     Assist level: Minimal Assistance - Patient > 75%     Wheelchair     Assist Will patient use wheelchair at discharge?: No(unless needed for dependent transport in community due to limited ambulation distance)             Wheelchair 50 feet with 2 turns activity    Assist            Wheelchair 150 feet activity     Assist          Blood pressure (!) 128/59, pulse 62, temperature (!) 97.4 F (36.3 C), resp. rate 18, height 5\' 8"  (1.727 m), weight 57.5 kg, SpO2 96 %.  Medical Problem List and Plan: 1.  Impaired mobility and ADLs secondary to cardiac debility s/p CABGx3.             -patient may shower             -ELOS/Goals: modI in PT, OT, I in SLP             -2 weeks colchicine for pericardial irritation  -Continue CIR PT and OT 2.  Antithrombotics: -DVT/anticoagulation: Starting Eliquis today (2/5)              -antiplatelet therapy: On ASA 3. Pain Management: Denies pain, not receiving any pain medications.  4. Mood: LCSW to  follow for evaluations and support.              -antipsychotic agents:  N/A 5. Neuropsych: This patient is capable of making decisions on his own behalf. 6. Skin/Wound Care: Monitor incisions for healing.  7. Fluids/Electrolytes/Nutrition: Offer supplements between meals to promote wound healing.  8. AFib: Continue to monitor HR tid and with increase in activity. Continue Amiodarone bid 9. Persistent Leucocytosis: WBC up to 16,000 +--infectious work up negative s/p IV Cefepime.   2/6- WBC down to 14k- afebrile since admission- will con't to monitor  2/8: Up to 18K. Afebrile, continue to monitor.  2/9- Will recheck labs today; also check U/A and Cx as well as CXR to figure out why so elevated.    2/9-  has to get sputum Cx prior to starting IV Vanc and Cefipime for presumed Pneumonia, based on CXR results- pt reports never had heme issues of any kind and rarely runs a fever, even when ill.   2/10- per d/w Cards, will order Chest CT to assess since leukocytosis that won't improve significantly with broad spectrum IV ABX- Vanc/Cefipime- labs in AM  10 Acute renal failure with hypernatremia: Need to encourage fluid intake.   2.6- Cr down to 1.23 and BUN down to 23- recovering- Na 145- so improving- con't regimen  2/8: Na increased to 147.   2/10- Na back down and Cr down to 1.06- improving in spite of Cefipime and Vanc- will need to follow/monitor to make sure doesn't cause renal impairment on IV ABX.  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 On ASA, Losartan, digoxin, coreg, Lipitor.   Filed Weights   01/11/20 0520 01/12/20 0455 01/13/20 0602  Weight: 52.2 kg 53.8 kg 57.5 kg    2/6- Weight down 2kg in 1 day- will montor for trend  2/7- Weight down another 1 kg- labs in AM  2/8: weight decreased.  2.9- Weight 53.8 kg   2/10- weight back up to 53.8kg- however Cr better- balancing act 12. Thrombocytopenia: Has resolved.  13. Shortness of breath: Encourage  IS. Continue  14. Constipation: Resolved. Can DC daily docusate, miralax, and dulcolax. Patient is refusing and moving bowels regularly.   2/6- having loose stools- due to colchicine likely- will d/c Colace and add Fibercon daily  2/7- improved per pt.   2/8: continues to improve.  15. Hypernatremia  2/9- was 147 yesterday- will recheck today and start IVFs if needed.   - is 144 today  2/10- labs in AM 16.  Disposition: Lives alone, but has supportive family, including daughter, nearby.      LOS: 5 days A FACE TO FACE EVALUATION WAS PERFORMED  Symantha Steeber 01/13/2020, 2:44 PM

## 2020-01-13 NOTE — Progress Notes (Signed)
Physical Therapy Session Note  Patient Details  Name: David Perez MRN: [id number] Date of Birth: November 29, 1941  Today's Date: 01/13/2020 PT Individual Time: 1122-1202 PT Individual Time Calculation (min): 40 min   Short Term Goals: Week 1:  PT Short Term Goal 1 (Week 1): Pt will perform functional transfers with CGA PT Short Term Goal 2 (Week 1): Pt will ambulate at least 46ft with CGA using LRAD PT Short Term Goal 3 (Week 1): Pt will ascend/descend 4 steps using L HR only with CGA PT Short Term Goal 4 (Week 1): Pt will demonstrate improvement by at least 57ft on the 6MWT  Skilled Therapeutic Interventions/Progress Updates:    Pt received in recliner with daughter present to observe session. Pt agreeable to tx.  Pt completes sit<>stand with CGA with cuing for decreased speed/momentum as pt with posterior LOB during transfer.   Gait training x 110 ft + 40 ft + 40 ft with RW (pt requests using RW vs no AD 2/2 feeling unsteady on feet) with CGA<>close supervision & decreased gait speed.  Acapella flutter valve delivered during session & therapist educated pt on purpose of AD and how to use it with pt return demonstrating; also reviewed use of incentive spirometer.   Pt concerned re: quality of voice over the past couple days & is awaiting CT scan today.  Pt SOB at rest & with activity, but SpO2 >90%, HR = 57-67 bpm after gait with pt on room air throughout.  Pt left in recliner with chair alarm donned, call bell & all needs in reach, daughter present in room.  Therapy Documentation Precautions:  Precautions Precautions: Sternal, Fall Precaution Comments: Reviewed sternal precautions Restrictions Weight Bearing Restrictions: Yes(Sternal )  Pain: Pt denies c/o pain.    Therapy/Group: Individual Therapy  Waunita Schooner 01/13/2020, 12:15 PM

## 2020-01-14 ENCOUNTER — Other Ambulatory Visit: Payer: Self-pay | Admitting: Cardiothoracic Surgery

## 2020-01-14 ENCOUNTER — Inpatient Hospital Stay (HOSPITAL_COMMUNITY)
Admission: RE | Admit: 2020-01-14 | Discharge: 2020-01-14 | Disposition: A | Discharge status: Home / Self Care | Payer: Medicare Other | Source: Intra-hospital | Attending: Cardiothoracic Surgery | Admitting: Cardiothoracic Surgery

## 2020-01-14 ENCOUNTER — Inpatient Hospital Stay (HOSPITAL_COMMUNITY): Payer: Medicare Other | Admitting: Occupational Therapy

## 2020-01-14 ENCOUNTER — Inpatient Hospital Stay (HOSPITAL_COMMUNITY): Payer: Medicare Other

## 2020-01-14 DIAGNOSIS — Z9889 Other specified postprocedural states: Secondary | ICD-10-CM

## 2020-01-14 DIAGNOSIS — Z951 Presence of aortocoronary bypass graft: Secondary | ICD-10-CM

## 2020-01-14 HISTORY — PX: IR THORACENTESIS ASP PLEURAL SPACE W/IMG GUIDE: IMG5380

## 2020-01-14 LAB — CBC WITH DIFFERENTIAL/PLATELET
Abs Immature Granulocytes: 0.14 10*3/uL — ABNORMAL HIGH (ref 0.00–0.07)
Basophils Absolute: 0.1 10*3/uL (ref 0.0–0.1)
Basophils Relative: 0 %
Eosinophils Absolute: 0.4 10*3/uL (ref 0.0–0.5)
Eosinophils Relative: 2 %
HCT: 35.7 % — ABNORMAL LOW (ref 39.0–52.0)
Hemoglobin: 11.6 g/dL — ABNORMAL LOW (ref 13.0–17.0)
Immature Granulocytes: 1 %
Lymphocytes Relative: 6 %
Lymphs Abs: 1.1 10*3/uL (ref 0.7–4.0)
MCH: 33.2 pg (ref 26.0–34.0)
MCHC: 32.5 g/dL (ref 30.0–36.0)
MCV: 102.3 fL — ABNORMAL HIGH (ref 80.0–100.0)
Monocytes Absolute: 1.3 10*3/uL — ABNORMAL HIGH (ref 0.1–1.0)
Monocytes Relative: 7 %
Neutro Abs: 14 10*3/uL — ABNORMAL HIGH (ref 1.7–7.7)
Neutrophils Relative %: 84 %
Platelets: 335 10*3/uL (ref 150–400)
RBC: 3.49 MIL/uL — ABNORMAL LOW (ref 4.22–5.81)
RDW: 18.7 % — ABNORMAL HIGH (ref 11.5–15.5)
WBC: 16.9 10*3/uL — ABNORMAL HIGH (ref 4.0–10.5)
nRBC: 0 % (ref 0.0–0.2)

## 2020-01-14 LAB — COMPREHENSIVE METABOLIC PANEL
ALT: 118 U/L — ABNORMAL HIGH (ref 0–44)
AST: 50 U/L — ABNORMAL HIGH (ref 15–41)
Albumin: 2.8 g/dL — ABNORMAL LOW (ref 3.5–5.0)
Alkaline Phosphatase: 87 U/L (ref 38–126)
Anion gap: 12 (ref 5–15)
BUN: 14 mg/dL (ref 8–23)
CO2: 23 mmol/L (ref 22–32)
Calcium: 8.6 mg/dL — ABNORMAL LOW (ref 8.9–10.3)
Chloride: 108 mmol/L (ref 98–111)
Creatinine, Ser: 1.15 mg/dL (ref 0.61–1.24)
GFR calc Af Amer: >60 mL/min (ref >60)
GFR calc non Af Amer: 60 mL/min — ABNORMAL LOW (ref >60)
Glucose, Bld: 112 mg/dL — ABNORMAL HIGH (ref 70–99)
Potassium: 3.7 mmol/L (ref 3.5–5.1)
Sodium: 143 mmol/L (ref 135–145)
Total Bilirubin: 2.5 mg/dL — ABNORMAL HIGH (ref 0.3–1.2)
Total Protein: 5.9 g/dL — ABNORMAL LOW (ref 6.5–8.1)

## 2020-01-14 LAB — BODY FLUID CELL COUNT WITH DIFFERENTIAL
Eos, Fluid: 2 %
Lymphs, Fluid: 13 %
Monocyte-Macrophage-Serous Fluid: 3 % — ABNORMAL LOW (ref 50–90)
Neutrophil Count, Fluid: 82 % — ABNORMAL HIGH (ref 0–25)
Total Nucleated Cell Count, Fluid: 2350 cu mm — ABNORMAL HIGH (ref 0–1000)

## 2020-01-14 LAB — GRAM STAIN

## 2020-01-14 LAB — PROTEIN, PLEURAL OR PERITONEAL FLUID: Total protein, fluid: 3 g/dL

## 2020-01-14 LAB — LACTATE DEHYDROGENASE, PLEURAL OR PERITONEAL FLUID: LD, Fluid: 585 U/L — ABNORMAL HIGH (ref 3–23)

## 2020-01-14 LAB — GLUCOSE, PLEURAL OR PERITONEAL FLUID: Glucose, Fluid: 136 mg/dL

## 2020-01-14 LAB — ALBUMIN, PLEURAL OR PERITONEAL FLUID: Albumin, Fluid: 1.7 g/dL

## 2020-01-14 LAB — DIGOXIN LEVEL: Digoxin Level: 0.4 ng/mL — ABNORMAL LOW (ref 0.8–2.0)

## 2020-01-14 MED ORDER — MELATONIN 3 MG PO TABS
6.0000 mg | ORAL_TABLET | Freq: Every evening | ORAL | Status: DC | PRN
  Administered 2020-01-19 – 2020-01-25 (×3): 6 mg via ORAL
  Filled 2020-01-14 (×5): qty 2

## 2020-01-14 MED ORDER — IOHEXOL 300 MG/ML  SOLN
75.0000 mL | Freq: Once | INTRAMUSCULAR | Status: AC | PRN
  Administered 2020-01-14: 75 mL via INTRAVENOUS

## 2020-01-14 MED ORDER — LIDOCAINE HCL 1 % IJ SOLN
INTRAMUSCULAR | Status: AC
  Filled 2020-01-14: qty 20

## 2020-01-14 MED ORDER — CALCIUM POLYCARBOPHIL 625 MG PO TABS
1250.0000 mg | ORAL_TABLET | Freq: Every day | ORAL | Status: DC
  Administered 2020-01-15 – 2020-01-29 (×15): 1250 mg via ORAL
  Filled 2020-01-14 (×15): qty 2

## 2020-01-14 MED ORDER — TRAZODONE HCL 50 MG PO TABS
25.0000 mg | ORAL_TABLET | Freq: Every day | ORAL | Status: DC
  Administered 2020-01-14: 23:00:00 25 mg via ORAL
  Filled 2020-01-14: qty 1

## 2020-01-14 NOTE — Procedures (Signed)
PROCEDURE SUMMARY:  Successful US guided left thoracentesis. Yielded 1.4 L of dark bloddy fluid. Pt tolerated procedure well. No immediate complications.  Specimen was sent for labs. CXR ordered.  EBL < 5 mL  Ascencion Dike PA-C 01/14/2020 2:17 PM

## 2020-01-14 NOTE — Progress Notes (Signed)
Physical Therapy Session Note  Patient Details  Name: David Perez MRN: [id number] Date of Birth: 1941/10/31  Today's Date: 01/14/2020 PT Individual Time: 1100-1110 PT Individual Time Calculation (min): 10 min   Short Term Goals: Week 1:  PT Short Term Goal 1 (Week 1): Pt will perform functional transfers with CGA PT Short Term Goal 2 (Week 1): Pt will ambulate at least 17ft with CGA using LRAD PT Short Term Goal 3 (Week 1): Pt will ascend/descend 4 steps using L HR only with CGA PT Short Term Goal 4 (Week 1): Pt will demonstrate improvement by at least 2ft on the 6MWT  Skilled Therapeutic Interventions/Progress Updates:     Patient in room with his daughter upon PT arrival. Patient some concern about upcoming procedure to drain lung infiltrates found on CT scan. PT provided general education about his limited activity tolerance over the past few days and the improvement he would be expected to see following this procedure. Provided patient with education on coping strategies for stress management with diaphragmatic breathing, a low stimulation environment, and listening to soft music versus having the TV on. Practiced diaphragmatic breathing x10 breaths with min cues for technique and pacing. Educated on PT goals and d/c planning, informing him and his daughter that his d/c would be flexible if any changes needed to be made due to his limited progress the past few days. 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 Patient declined participating in any mobility or exercise due to fatigue this morning and requested to rest prior to his procedure. Patient missed 50 min of skilled PT due to fatigue, will attempt to make up missed time as able. Patient in recliner with his daughter in the room at end of session with breaks locked, seat belt alarm set, and all needs within reach.   Therapy Documentation Precautions:  Precautions Precautions: Sternal, Fall Precaution Comments: Reviewed sternal  precautions Restrictions Weight Bearing Restrictions: Yes(sternal precautions) General: PT Amount of Missed Time (min): 50 Minutes PT Missed Treatment Reason: Patient fatigue    Therapy/Group: Individual Therapy  Zalea Pete L Pria Klosinski PT, DPT  01/14/2020, 11:29 AM

## 2020-01-14 NOTE — Progress Notes (Signed)
T[facility] Follow-up  CT Chest reviewed. L>R pleural effusion. Suggest IR for pigtail drainage.  Please call if questions. Rabon Scholle Z. Orvan Seen, [place]

## 2020-01-14 NOTE — Progress Notes (Signed)
[facility] PHYSICAL MEDICINE & REHABILITATION PROGRESS NOTE   Subjective/Complaints:  Pt reports not sleeping- difficulty falling asleep AND staying asleep- can't sleep past 1:30 am. LBM this AM- got chest CT this am- went over results- - showed Pleural effusions L>R- I agree with CT surgery that pt needs to have drainage of pleural effusions by IR- will place order with help of PA.  Pt also notes that stools consistently of loose applesauce- and needs help with that- probably made worse by IV ABX.   ROS: pt denies cardiac CP, SOB, abd pain, HA, vision changes, mood changes, N/V/C however does c/o loose stools due to colchicine  Objective:   CT CHEST W CONTRAST  Result Date: 01/14/2020 CLINICAL DATA:  Inpatient. Leukocytosis. Persistent cough. Intermittent right chest wall pain. History of aortic valve replacement, CABG and atrial appendage clipping. EXAM: CT CHEST WITH CONTRAST TECHNIQUE: Multidetector CT imaging of the chest was performed during intravenous contrast administration. CONTRAST:  81mL OMNIPAQUE IOHEXOL 300 MG/ML  SOLN COMPARISON:  01/12/2020 chest radiograph. 12/31/2019 chest CT. FINDINGS: Cardiovascular: Mild cardiomegaly, increased from prior chest CT. No significant pericardial effusion/thickening. Aortic valve prosthesis in place. 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 Atherosclerotic nonaneurysmal thoracic aorta. Normal caliber main pulmonary artery. No central pulmonary emboli. Mediastinum/Nodes: No discrete thyroid nodules. Unremarkable esophagus. No pathologically enlarged axillary, mediastinal or hilar lymph nodes. There a few scattered small gas bubbles with associated fat stranding in the anterior mediastinum. No discrete retrosternal or mediastinal fluid collections. Lungs/Pleura: No pneumothorax. Small to moderate right and moderate to marked dependent left pleural effusions. Moderate centrilobular emphysema. No lung masses or significant  pulmonary nodules. Mild-to-moderate right and moderate-to-marked left dependent lower lobe passive atelectasis. Patchy ground-glass opacities in the dependent lungs bilaterally are decreased in the interval. Upper abdomen: No acute abnormality. Musculoskeletal: No aggressive appearing focal osseous lesions. Intact sternotomy wires. 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 Mild scattered subcutaneous emphysema in the left axillary and ventral lower right chest wall. IMPRESSION: 1. Mild cardiomegaly, increased from prior chest CT. Small to moderate right and moderate-to-marked left dependent pleural effusions with associated lower lobe atelectasis. 2. Patchy ground-glass opacities in the dependent lungs bilaterally are decreased in the interval, suggesting improving inflammatory process. 3. Scattered small gas bubbles with associated fat stranding in the anterior mediastinum compatible with recent surgery. No discrete retrosternal or mediastinal fluid collections. 4. Mild subcutaneous emphysema in the left axillary and ventral lower right chest wall. 5. Aortic Atherosclerosis (ICD10-I70.0) and Emphysema (ICD10-J43.9). Electronically Signed   By: Ilona Sorrel M.D.   On: 01/14/2020 09:01   DG CHEST PORT 1 VIEW  Result Date: 01/12/2020 CLINICAL DATA:  Leukocytosis EXAM: PORTABLE CHEST - 1 VIEW COMPARISON:  01/07/2020 FINDINGS: Pleural effusions and worsening consolidation/atelectasis in the lung bases, left greater than right, increased since previous exam. Some increase in central pulmonary vascular congestion and probable mild interstitial edema. Heart size upper limits normal for technique. Previous CABG and AVR. No pneumothorax. Sternotomy wires. IMPRESSION: Worsening bibasilar consolidation/atelectasis and effusions, left greater than right. Electronically Signed   By: Lucrezia Europe M.D.   On: 01/12/2020 11:13   Recent Labs    01/13/20 0539 01/14/20 0550  WBC 20.1* 16.9*  HGB 10.6*  11.6*  HCT 32.8* 35.7*  PLT 311 335   Recent Labs    01/13/20 0539 01/14/20 0550  NA 144 143  K 3.9 3.7  CL 113* 108  CO2 23 23  GLUCOSE 115* 112*  BUN 14  14  CREATININE 1.06 1.15  CALCIUM 8.3* 8.6*    Intake/Output Summary (Last 24 hours) at 01/14/2020 0911 Last data filed at 01/14/2020 0827 Gross per 24 hour  Intake 650 ml  Output 300 ml  Net 350 ml     Physical Exam: Vital Signs Blood pressure (!) 132/58, pulse 65, temperature 97.8 F (36.6 C), temperature source Oral, resp. rate 16, height 5\' 8"  (1.727 m), weight 57.5 kg, SpO2 93 %.  Physical Exam  General: Alert and oriented x 3, sitting up in bed; just finished breakfast; frustrated about sleep issues but appropriate; NAD HEENT: conjugate gaze; trachea midline Neck: Supple with no JVD Heart: Reg rate and rhythm. Mild murmur heard; no R/G Chest: CTA B/L- except at bases decreased air movement which has been the same for a few days; no cough.  Abdomen: Soft, non-tender, non-distended, (+) BS Extremities: No clubbing, cyanosis, or edema. Pulses are 2+ Skin: Surgical incision on chest looks good Neuro: Pt is cognitively appropriate with normal insight, memory, and awareness.  Sensory exam is normal. Reflexes are 2+ in all 4's. Fine motor coordination is intact. No tremors. Motor function is grossly 5/5.  Musculoskeletal: Full ROM, No pain with AROM or PROM in the neck, trunk, or extremities. Posture appropriate Psych: flat affect; but appropriate/cordial   Assessment/Plan: 1. 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  Physiatrist is providing close team supervision and 24 hour management of active medical problems listed below.  Physiatrist and rehab team continue to assess barriers to discharge/monitor patient progress toward functional and medical goals  Care Tool:  Bathing    Body parts  bathed by patient: Right arm, Left arm, Chest, Abdomen, Front perineal area, Buttocks, Right upper leg, Left upper leg, Right lower leg, Left lower leg, Face   Body parts bathed by helper: Chest, Abdomen, Right lower leg, Left lower leg     Bathing assist Assist Level: Minimal Assistance - Patient > 75%     Upper Body Dressing/Undressing Upper body dressing Upper body dressing/undressing activity did not occur (including orthotics): N/A What is the patient wearing?: Pull over shirt    Upper body assist Assist Level: Minimal Assistance - Patient > 75%    Lower Body Dressing/Undressing Lower body dressing      What is the patient wearing?: Incontinence brief, Pants     Lower body assist Assist for lower body dressing: Minimal Assistance - Patient > 75%     Toileting Toileting    Toileting assist Assist for toileting: Contact Guard/Touching assist     Transfers Chair/bed transfer  Transfers assist     Chair/bed transfer assist level: Contact Guard/Touching assist     Locomotion Ambulation   Ambulation assist      Assist level: Contact Guard/Touching assist Assistive device: Other (comment)(IV pole) Max distance: 70'   Walk 10 feet activity   Assist     Assist level: Contact Guard/Touching assist Assistive device: Other (comment)(IV pole)   Walk 50 feet activity   Assist    Assist level: Contact Guard/Touching assist Assistive device: Other (comment)(IV pole)    Walk 150 feet activity   Assist Walk 150 feet activity did not occur: Safety/medical concerns         Walk 10 feet on uneven surface  activity   Assist     Assist level: Minimal Assistance - Patient > 75%     Wheelchair  Assist Will patient use wheelchair at discharge?: No(unless needed for dependent transport in community due to limited ambulation distance)             Wheelchair 50 feet with 2 turns activity    Assist            Wheelchair 150  feet activity     Assist          Blood pressure (!) 132/58, pulse 65, temperature 97.8 F (36.6 C), temperature source Oral, resp. rate 16, height 5\' 8"  (1.727 m), weight 57.5 kg, SpO2 93 %.  Medical Problem List and Plan: 1.  Impaired mobility and ADLs secondary to cardiac debility s/p CABGx3.             -patient may shower             -ELOS/Goals: modI in PT, OT, I in SLP             -2 weeks colchicine for pericardial irritation  -Continue CIR PT and OT 2.  Antithrombotics: -DVT/anticoagulation: Starting Eliquis today (2/5)              -antiplatelet therapy: On ASA 3. Pain Management: Denies pain, not receiving any pain medications.  4. Mood: LCSW to follow for evaluations and support.              -antipsychotic agents:  N/A 5. Neuropsych: This patient is capable of making decisions on his own behalf. 6. Skin/Wound Care: Monitor incisions for healing.  7. Fluids/Electrolytes/Nutrition: Offer supplements between meals to promote wound healing.  8. AFib: Continue to monitor HR tid and with increase in activity. Continue Amiodarone bid 9. Persistent Leucocytosis: WBC up to 16,000 +--infectious work up negative s/p IV Cefepime.   2/6- WBC down to 14k- afebrile since admission- will con't to monitor  2/8: Up to 18K. Afebrile, continue to monitor.  2/9- Will recheck labs today; also check U/A and Cx as well as CXR to figure out why so elevated.    2/9- has to get sputum Cx prior to starting IV Vanc and Cefipime for presumed Pneumonia, based on CXR results- pt reports never had heme issues of any kind and rarely runs a fever, even when ill.   2/10- per d/w Cards, will order Chest CT to assess since leukocytosis that won't improve significantly with broad spectrum IV ABX- Vanc/Cefipime- labs in AM  2/11- agree with Cards- has L>R pleural effusions that are significant- needs IR drainage- via pigtail- will order IR to drain pleural effusions; con't IVF ABX_ WBC down to 16.9k- so  helping some but not enough- will also con't IV ABX for now  10 Acute renal failure with hypernatremia: Need to encourage fluid intake.   2.6- Cr down to 1.23 and BUN down to 23- recovering- Na 145- so improving- con't regimen  2/8: Na increased to 147.   2/10- Na back down and Cr down to 1.06- improving in spite of Cefipime and Vanc- will need to follow/monitor to make sure doesn't cause renal impairment on IV ABX.  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 On ASA, Losartan, digoxin, coreg, Lipitor.   Filed Weights   01/11/20 0520 01/12/20 0455 01/13/20 0602  Weight: 52.2 kg 53.8 kg 57.5 kg    2/6- Weight down 2kg in 1 day- will montor for trend  2/7- Weight down another 1 kg- labs in AM  2/8: weight decreased.  2.9- Weight 53.8 kg  2/10- weight back up to 53.8kg- however Cr better- balancing act  2/11- 57.5 kg- up 4 kg- that doesn't make sense. Will con't to follow.  12. Thrombocytopenia: Has resolved.  13. Shortness of breath: Encourage IS. Continue  14. Constipation: Resolved. Can DC daily docusate, miralax, and dulcolax. Patient is refusing and moving bowels regularly.   2/6- having loose stools- due to colchicine likely- will d/c Colace and add Fibercon daily  2/7- improved per pt.   2/8: continues to improve.  2/11- looser stools- likely due to IV ABX- will increase Fibercon dose and if doesn't improve, will check CDIff- doesn't smell or sound like C Diff- however not diagnostic- will check   15. Hypernatremia  2/9- was 147 yesterday- will recheck today and start IVFs if needed.   - is 144 today  2/10- labs in AM 16. Insomnia  2/11- will make mleatonin prn and add Trazodone 25 mg QHS and monitor for sleeping- put on sleep chart.  17.  Disposition: Lives alone, but has supportive family, including daughter, nearby.      LOS: 6 days A FACE TO FACE EVALUATION WAS PERFORMED  Latonja Bobeck 01/14/2020, 9:11 AM

## 2020-01-14 NOTE — Progress Notes (Signed)
Physical Therapy Session Note  Patient Details  Name: David Perez MRN: [id number] Date of Birth: 1941/01/05  Today's Date: 01/14/2020 PT Individual Time: 0800-0900 PT Individual Time Calculation (min): 60 min   Short Term Goals: Week 1:  PT Short Term Goal 1 (Week 1): Pt will perform functional transfers with CGA PT Short Term Goal 2 (Week 1): Pt will ambulate at least 45ft with CGA using LRAD PT Short Term Goal 3 (Week 1): Pt will ascend/descend 4 steps using L HR only with CGA PT Short Term Goal 4 (Week 1): Pt will demonstrate improvement by at least 62ft on the 6MWT Week 2:    Week 3:     Skilled Therapeutic Interventions/Progress Updates:     PAIN  Intermittent L chest area discomfort, reports this has been thoroughly investigated by MD and not of concern.  Treatment to tolerance.  Pt initially supine and agreeable to treatment session with focus on am ADLs, gait, endurance.  Supine to sit w/cues for sequencing, hob elevated, cga, minimal reminders required for sternal precautions.  Pt maintains sitting balance while therapist donns ted hose.  STS from bed w/cues for sequencing and cga.  Gt 66ft to sink w/rw w/cga.  Pt stood at sink x 2 min to initiate oral care, requested to use BSC.  Sink to bsc SPT w/cga.  Pt continent of loose bowel mvmt.  Performs hygiene w/set up assist and supervision.  Pt returned to standing at sink x 7-8 min while he completed care of dentures/oral care/washing face w/cga to close supervision.  No rest breaks needed during task.  SPT to adjacent recliner w/cga.  Performed AROM LE therex that he independently recalled including LAQ's, hip abd/add, scapular retraction, and ankle pumps. Pt also performed several min resp activity - using flutter valve.    MD in to discussed planned procedure and recommended light exertion today/short distance gait to tolerance.   STS from recliner w/cga and gait 67ft w/RW and close supervision, turn/sit to recliner w/verbal cues.   Pt appearing anxious regarding procedure, emotional support provided. Pt left oob in recliner w/chair alarm set and needs in reach.  Pt w/shallow breathing noted throughout session, 02 sats remain in 90s, limited activity pending pulmonary procedure   Therapy Documentation Precautions:  Precautions Precautions: Sternal, Fall Precaution Comments: Reviewed sternal precautions Restrictions Weight Bearing Restrictions: Yes(sternal precautions)    Therapy/Group: Individual Therapy  Callie Fielding, Evaro 01/14/2020, 12:25 PM

## 2020-01-14 NOTE — Progress Notes (Signed)
Occupational Therapy Session Note  Patient Details  Name: David Perez MRN: [id number] Date of Birth: [birth year]  Today's Date: 01/14/2020 OT Individual Time:  -       Short Term Goals: Week 1:  OT Short Term Goal 1 (Week 1): Pt will perform UB dressing with min A and min cuing to maintain precautions. OT Short Term Goal 2 (Week 1): Pt will perform bathing tasks with min A overall. OT Short Term Goal 3 (Week 1): PT will perform LB dressing with min A overall.  Skilled Therapeutic Interventions/Progress Updates:    patient off unit this afternoon for medical procedure - missed 75 minutes of scheduled therapy   Therapy Documentation Precautions:  Precautions Precautions: Sternal, Fall Precaution Comments: Reviewed sternal precautions Restrictions Weight Bearing Restrictions: Yes(sternal precautions) General: General OT Amount of Missed Time: 75 Minutes PT Missed Treatment Reason: Patient fatigue   Therapy/Group: Individual Therapy  [name] 01/14/2020, 2:18 PM

## 2020-01-15 ENCOUNTER — Inpatient Hospital Stay (HOSPITAL_COMMUNITY): Payer: Medicare Other

## 2020-01-15 ENCOUNTER — Inpatient Hospital Stay (HOSPITAL_COMMUNITY): Payer: Medicare Other | Admitting: Physical Therapy

## 2020-01-15 ENCOUNTER — Inpatient Hospital Stay (HOSPITAL_COMMUNITY): Payer: Medicare Other | Admitting: Occupational Therapy

## 2020-01-15 LAB — CYTOLOGY - NON PAP

## 2020-01-15 LAB — PH, BODY FLUID: pH, Body Fluid: 7.7

## 2020-01-15 MED ORDER — TRAZODONE HCL 50 MG PO TABS
50.0000 mg | ORAL_TABLET | Freq: Every day | ORAL | Status: DC
  Administered 2020-01-15 – 2020-01-28 (×14): 50 mg via ORAL
  Filled 2020-01-15 (×14): qty 1

## 2020-01-15 NOTE — Progress Notes (Signed)
[facility] PHYSICAL MEDICINE & REHABILITATION PROGRESS NOTE   Subjective/Complaints:  Pt reports did well during and after L sided thoracentesis done yesterday- only drained L side- got 1.4 L of dark bloody fluid, per procedure note.   Pt also feels like his energy is better and breathing might be a little better.   Admits he broke his sternal precautions for a 1 minute or so when doing procedure, but then fixed it.   Also, didn't sleep last night with Trazodone 25 mg QHS.   ROS: pt denies cardiac CP, SOB, abd pain, HA, vision changes, mood changes, N/V/C ; still loose stools.  Objective:   DG Chest 2 View  Result Date: 01/14/2020 CLINICAL DATA:  Status post left thoracentesis EXAM: CHEST - 2 VIEW COMPARISON:  01/12/2020 FINDINGS: Cardiac shadow is enlarged. Postsurgical changes are noted. 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 Significant reduction in left-sided pleural effusion is noted following thoracentesis. No pneumothorax is noted. Improved vascular congestion is seen when compared with the prior study. IMPRESSION: No pneumothorax following left thoracentesis. Electronically Signed   By: Inez Catalina M.D.   On: 01/14/2020 14:30   CT CHEST W CONTRAST  Result Date: 01/14/2020 CLINICAL DATA:  Inpatient. Leukocytosis. Persistent cough. Intermittent right chest wall pain. History of aortic valve replacement, CABG and atrial appendage clipping. EXAM: CT CHEST WITH CONTRAST TECHNIQUE: Multidetector CT imaging of the chest was performed during intravenous contrast administration. CONTRAST:  64mL OMNIPAQUE IOHEXOL 300 MG/ML  SOLN COMPARISON:  01/12/2020 chest radiograph. 12/31/2019 chest CT. FINDINGS: Cardiovascular: Mild cardiomegaly, increased from prior chest CT. No significant pericardial effusion/thickening. Aortic valve prosthesis in place. 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 Atherosclerotic nonaneurysmal thoracic aorta. Normal caliber main pulmonary  artery. No central pulmonary emboli. Mediastinum/Nodes: No discrete thyroid nodules. Unremarkable esophagus. No pathologically enlarged axillary, mediastinal or hilar lymph nodes. There a few scattered small gas bubbles with associated fat stranding in the anterior mediastinum. No discrete retrosternal or mediastinal fluid collections. Lungs/Pleura: No pneumothorax. Small to moderate right and moderate to marked dependent left pleural effusions. Moderate centrilobular emphysema. No lung masses or significant pulmonary nodules. Mild-to-moderate right and moderate-to-marked left dependent lower lobe passive atelectasis. Patchy ground-glass opacities in the dependent lungs bilaterally are decreased in the interval. Upper abdomen: No acute abnormality. Musculoskeletal: No aggressive appearing focal osseous lesions. Intact sternotomy wires. 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 Mild scattered subcutaneous emphysema in the left axillary and ventral lower right chest wall. IMPRESSION: 1. Mild cardiomegaly, increased from prior chest CT. Small to moderate right and moderate-to-marked left dependent pleural effusions with associated lower lobe atelectasis. 2. Patchy ground-glass opacities in the dependent lungs bilaterally are decreased in the interval, suggesting improving inflammatory process. 3. Scattered small gas bubbles with associated fat stranding in the anterior mediastinum compatible with recent surgery. No discrete retrosternal or mediastinal fluid collections. 4. Mild subcutaneous emphysema in the left axillary and ventral lower right chest wall. 5. Aortic Atherosclerosis (ICD10-I70.0) and Emphysema (ICD10-J43.9). Electronically Signed   By: Ilona Sorrel M.D.   On: 01/14/2020 09:01   IR THORACENTESIS ASP PLEURAL SPACE W/IMG GUIDE  Result Date: 01/14/2020 INDICATION: Shortness of breath. Left-sided pleural effusion. Status post open heart surgery. Request diagnostic and therapeutic  thoracentesis. EXAM: ULTRASOUND GUIDED LEFT THORACENTESIS MEDICATIONS: None. COMPLICATIONS: None immediate. PROCEDURE: An ultrasound guided thoracentesis was thoroughly discussed with the patient and questions answered. The benefits, risks, alternatives and complications were also discussed. The patient understands and wishes to proceed with  the procedure. Written consent was obtained. Ultrasound was performed to localize and mark an adequate pocket of fluid in the left chest. The area was then prepped and draped in the normal sterile fashion. 1% Lidocaine was used for local anesthesia. Under ultrasound guidance a 6 Fr Safe-T-Centesis catheter was introduced. Thoracentesis was performed. The catheter was removed and a dressing applied. FINDINGS: A total of approximately 1.4 L of dark bloody fluid was removed. Samples were sent to the laboratory as requested by the clinical team. IMPRESSION: Successful ultrasound guided left thoracentesis yielding 1.4 L of pleural fluid. Read by: Ascencion Dike PA-C Electronically Signed   By: Corrie Mckusick D.O.   On: 01/14/2020 14:20   Recent Labs    01/13/20 0539 01/14/20 0550  WBC 20.1* 16.9*  HGB 10.6* 11.6*  HCT 32.8* 35.7*  PLT 311 335   Recent Labs    01/13/20 0539 01/14/20 0550  NA 144 143  K 3.9 3.7  CL 113* 108  CO2 23 23  GLUCOSE 115* 112*  BUN 14 14  CREATININE 1.06 1.15  CALCIUM 8.3* 8.6*    Intake/Output Summary (Last 24 hours) at 01/15/2020 0906 Last data filed at 01/15/2020 0815 Gross per 24 hour  Intake 650 ml  Output 325 ml  Net 325 ml     Physical Exam: Vital Signs Blood pressure 134/77, pulse 68, temperature 98.3 F (36.8 C), temperature source Oral, resp. rate 18, height 5\' 8"  (1.727 m), weight 56.2 kg, SpO2 95 %.  Physical Exam  General: Alert and oriented x 3, sitting up in bed; appears better color and slightly improved/comofortable breathing, NAD HEENT: conjugate gaze; trachea midline Neck: Supple with no JVD Heart: Reg  rate and rhythm. Mild murmur heard; no R/G Chest: better air movement; esp at L base; no W/R/R Abdomen: Soft, non-tender, non-distended, (+) BS Extremities: No clubbing, cyanosis, or edema. Pulses are 2+ Skin: Surgical incision on chest looks good Neuro: Pt is cognitively appropriate with normal insight, memory, and awareness.  Sensory exam is normal. Reflexes are 2+ in all 4's. Fine motor coordination is intact. No tremors. Motor function is grossly 5/5.  Musculoskeletal: Full ROM, No pain with AROM or PROM in the neck, trunk, or extremities. Posture appropriate Psych: flat affect, but smiled once.    Assessment/Plan: 1. 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  Physiatrist is providing close team supervision and 24 hour management of active medical problems listed below.  Physiatrist and rehab team continue to assess barriers to discharge/monitor patient progress toward functional and medical goals  Care Tool:  Bathing    Body parts bathed by patient: Right arm, Left arm, Chest, Abdomen, Front perineal area, Buttocks, Right upper leg, Left upper leg, Right lower leg, Left lower leg, Face   Body parts bathed by helper: Chest, Abdomen, Right lower leg, Left lower leg     Bathing assist Assist Level: Minimal Assistance - Patient > 75%     Upper Body Dressing/Undressing Upper body dressing Upper body dressing/undressing activity did not occur (including orthotics): N/A What is the patient wearing?: Pull over shirt    Upper body assist Assist Level: Minimal Assistance - Patient > 75%    Lower Body Dressing/Undressing Lower body dressing      What is the patient wearing?: Incontinence brief, Pants     Lower body assist Assist for lower body dressing: Minimal Assistance - Patient >  75%     Toileting Toileting    Toileting assist Assist for toileting:  Contact Guard/Touching assist     Transfers Chair/bed transfer  Transfers assist     Chair/bed transfer assist level: Contact Guard/Touching assist     Locomotion Ambulation   Ambulation assist      Assist level: Contact Guard/Touching assist Assistive device: Walker-rolling Max distance: 25   Walk 10 feet activity   Assist     Assist level: Contact Guard/Touching assist Assistive device: Walker-rolling   Walk 50 feet activity   Assist    Assist level: Contact Guard/Touching assist Assistive device: Other (comment)(IV pole)    Walk 150 feet activity   Assist Walk 150 feet activity did not occur: Safety/medical concerns         Walk 10 feet on uneven surface  activity   Assist     Assist level: Minimal Assistance - Patient > 75%     Wheelchair     Assist Will patient use wheelchair at discharge?: No(unless needed for dependent transport in community due to limited ambulation distance)             Wheelchair 50 feet with 2 turns activity    Assist            Wheelchair 150 feet activity     Assist          Blood pressure 134/77, pulse 68, temperature 98.3 F (36.8 C), temperature source Oral, resp. rate 18, height 5\' 8"  (1.727 m), weight 56.2 kg, SpO2 95 %.  Medical Problem List and Plan: 1.  Impaired mobility and ADLs secondary to cardiac debility s/p CABGx3.             -patient may shower             -ELOS/Goals: modI in PT, OT, I in SLP             -2 weeks colchicine for pericardial irritation  -Continue CIR PT and OT 2.  Antithrombotics: -DVT/anticoagulation: Starting Eliquis today (2/5)              -antiplatelet therapy: On ASA 3. Pain Management: Denies pain, not receiving any pain medications.  4. Mood: LCSW to follow for evaluations and support.              -antipsychotic agents:  N/A 5. Neuropsych: This patient is capable of making decisions on his own behalf. 6. Skin/Wound Care: Monitor  incisions for healing.  7. Fluids/Electrolytes/Nutrition: Offer supplements between meals to promote wound healing.  8. AFib: Continue to monitor HR tid and with increase in activity. Continue Amiodarone bid 9. Persistent Leucocytosis: WBC up to 16,000 +--infectious work up negative s/p IV Cefepime.   2/6- WBC down to 14k- afebrile since admission- will con't to monitor  2/8: Up to 18K. Afebrile, continue to monitor.  2/9- Will recheck labs today; also check U/A and Cx as well as CXR to figure out why so elevated.    2/9- has to get sputum Cx prior to starting IV Vanc and Cefipime for presumed Pneumonia, based on CXR results- pt reports never had heme issues of any kind and rarely runs a fever, even when ill.   2/10- per d/w Cards, will order Chest CT to assess since leukocytosis that won't improve significantly with broad spectrum IV ABX- Vanc/Cefipime- labs in AM  2/11- agree with Cards- has L>R pleural effusions that are significant- needs IR drainage- via pigtail- will order IR to drain  pleural effusions; con't IVF ABX_ WBC down to 16.9k- so helping some but not enough- will also con't IV ABX for now   2/12- Actually decided to get Thoracentesis not pigtail drain, per IR, since not loculated and simple.  Will recheck labs in AM- got 1.4L blood fluid- waiting for lab results to come back. Con't IV ABX still .  10 Acute renal failure with hypernatremia: Need to encourage fluid intake.   2.6- Cr down to 1.23 and BUN down to 23- recovering- Na 145- so improving- con't regimen  2/8: Na increased to 147.   2/10- Na back down and Cr down to 1.06- improving in spite of Cefipime and Vanc- will need to follow/monitor to make sure doesn't cause renal impairment on IV ABX.  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 On ASA, Losartan, digoxin, coreg, Lipitor.   Filed Weights   01/12/20 0455 01/13/20 0602 01/15/20 0438  Weight: 53.8 kg 57.5 kg 56.2 kg    2/6- Weight  down 2kg in 1 day- will montor for trend  2/7- Weight down another 1 kg- labs in AM  2/8: weight decreased.  2.9- Weight 53.8 kg   2/10- weight back up to 53.8kg- however Cr better- balancing act  2/11- 57.5 kg- up 4 kg- that doesn't make sense. Will con't to follow.   2/12- weight 56 kg- down 1.5 kg? 12. Thrombocytopenia: Has resolved.  13. Shortness of breath: Encourage IS. Continue  14. Constipation: Resolved. Can DC daily docusate, miralax, and dulcolax. Patient is refusing and moving bowels regularly.   2/6- having loose stools- due to colchicine likely- will d/c Colace and add Fibercon daily  2/7- improved per pt.   2/8: continues to improve.  2/11- looser stools- likely due to IV ABX- will increase Fibercon dose and if doesn't improve, will check CDIff- doesn't smell or sound like C Diff- however not diagnostic- will check   15. Hypernatremia  2/9- was 147 yesterday- will recheck today and start IVFs if needed.   - is 144 today  2/10- labs in AM 16. Insomnia  2/11- will make mleatonin prn and add Trazodone 25 mg QHS and monitor for sleeping- put on sleep chart.  2/12- didn't sleep at all; will increase to 50 mg QHS  17.  Disposition: Lives alone, but has supportive family, including daughter, nearby.      LOS: 7 days A FACE TO FACE EVALUATION WAS PERFORMED  Emersyn Kotarski 01/15/2020, 9:06 AM

## 2020-01-15 NOTE — Progress Notes (Signed)
Occupational Therapy Session Note  Patient Details  Name: David Perez MRN: [id number] Date of Birth: 05/03/1941  Today's Date: 01/15/2020 OT Individual Time: [id number] OT Individual Time Calculation (min): 58 min    Short Term Goals: Week 1:  OT Short Term Goal 1 (Week 1): Pt will perform UB dressing with min A and min cuing to maintain precautions. OT Short Term Goal 2 (Week 1): Pt will perform bathing tasks with min A overall. OT Short Term Goal 3 (Week 1): PT will perform LB dressing with min A overall.  Skilled Therapeutic Interventions/Progress Updates:    Patient in bed, alert and ready for therapy session.   He states that he does not have pain, but did not sleep well last night.  He was pleased that his procedure yesterday afternoon went well and he feels like he is breathing better.  Supine to sitting with CS.  Ambulation with RW bed to toilet with CGA.  toileting completed with CGA.  He ambulated to w/c with CGA, completed grooming tasks w/c level with set up, LB dressing w/c level min A for pants over feet.  OH shirt with set up.   Patient completed x3 60-80 ft walks with RW CGA - on room air O2 sat 99%.  Reviewed energy conservation, breathing techniques - he demonstrates good carryover.  He remained sitting in w/c at close of session, seat belt alarm set and call bell/tray table in reach.    Therapy Documentation Precautions:  Precautions Precautions: Sternal, Fall Precaution Comments: Reviewed sternal precautions Restrictions Weight Bearing Restrictions: Yes(sternal precautions)   Vital Signs: Therapy Vitals Temp: 98.3 F (36.8 C) Temp Source: Oral Pulse Rate: 68 Resp: 18 BP: 134/77 Patient Position (if appropriate): Sitting Oxygen Therapy SpO2: 95 % O2 Device: Room Air   Other Treatments:     Therapy/Group: Individual Therapy  [name] 01/15/2020, 7:38 AM

## 2020-01-15 NOTE — Progress Notes (Signed)
Physical Therapy Session Note  Patient Details  Name: David Perez MRN: [id number] Date of Birth: [birth year]  Today's Date: 01/15/2020 PT Individual Time: [id number] PT Individual Time Calculation (min): 58 min   Short Term Goals: Week 1:  PT Short Term Goal 1 (Week 1): Pt will perform functional transfers with CGA PT Short Term Goal 2 (Week 1): Pt will ambulate at least 11ft with CGA using LRAD PT Short Term Goal 3 (Week 1): Pt will ascend/descend 4 steps using L HR only with CGA PT Short Term Goal 4 (Week 1): Pt will demonstrate improvement by at least 25ft on the 6MWT  Skilled Therapeutic Interventions/Progress Updates:    Pt reports feeling like he may have done too much with OT this morning from a walking standpoint and pretty fatigued. Education and discussion about energy conservation techniques throughout session and self pacing with activities. Education provided on use of breathing techniques as well as education in regards to posture and improved capacity for full breath. Pt very receptive to all education throughout session. Pt with extra rest breaks during session but able to complete all activities with modified breaks and O2 sats and HR remained WFL on room air.   Performed functional sit <> stands and transfers with overall CGA to close supervision with RW (for endurance and energy conservation) with good adherence to sternal precautions.   Instructed in HEP for functional strengthening and ROM/stretching per some patient questions. Variety of seated, standing, and supine positions to accommodate for level of fatigue as well as position for independence even in the room currently (seated or supine) including:  Seated heel and toe raises x 20 reps each BLE  Seated LAQ with 3-5 sec hold x 15 reps each BLE  Seated figure 4 stretch with 20-30 sec hold x 3 reps each  Supine figure 4 stretch with 20-30 sec hold x 2 reps each  Supine trunk rotation x 10 reps each direction  Standing  marches x 10 reps with minimal BUE for balance  Standing mini squats x 10 reps without UE support to challenge balance  Engaged in community mobility training and dynamic gait to negotiate ramp with RW as well as simulated car transfer. Pt performed both at [place] level for balance and safety. Pt performs car transfer with good adherence to sternal precautions and self corrected technique for sit <> stand due to low surface.    Therapy Documentation Precautions:  Precautions Precautions: Sternal, Fall Precaution Comments: Reviewed sternal precautions Restrictions Weight Bearing Restrictions: Yes(sternal precautions)   Pain:  Denies pain.   Therapy/Group: Individual Therapy  Canary Brim Ivory Broad, PT, DPT, CBIS  01/15/2020, 12:00 PM

## 2020-01-15 NOTE — Progress Notes (Signed)
Physical Therapy Session Note  Patient Details  Name: David Perez MRN: [id number] Date of Birth: [birth year]  Today's Date: 01/15/2020 PT Individual Time: [id number] PT Individual Time Calculation (min): 45 min   Short Term Goals: Week 1:  PT Short Term Goal 1 (Week 1): Pt will perform functional transfers with CGA PT Short Term Goal 2 (Week 1): Pt will ambulate at least 58ft with CGA using LRAD PT Short Term Goal 3 (Week 1): Pt will ascend/descend 4 steps using L HR only with CGA PT Short Term Goal 4 (Week 1): Pt will demonstrate improvement by at least 53ft on the 6MWT  Skilled Therapeutic Interventions/Progress Updates:    Session focused on overall functional mobility training and activity tolerance in preparation for d/c. Pt performs transfers with RW at overall CGA throughout session for balance and safety. Stair negotiation training for home entry practice and general strengthening, endurance, and balance with L rail with overall CGA and seated rest breaks due to fatigue between trials: 4 steps, 8 steps, and then 8 steps again. Reinforced education from earlier session in regards to energy conservation and progression of endurance activities upon d/c and recovery. Functional gait training x 50' and then x 150' with close supervision to occasional CGA for balance during turns or obstacle negotiation. Vitals monitored throughout session and despite increased work of breathing, pt vitals remain WFL. Cues for diaphragmatic breathing technique when short of breath to slow breathing rate. Pt continues to be receptive to all education and working towards increased independence for d/c.   Therapy Documentation Precautions:  Precautions Precautions: Sternal, Fall Precaution Comments: Reviewed sternal precautions Restrictions Weight Bearing Restrictions: Yes(sternal precautions)   Vital Signs: O2 >92% HR = 66 bpm  Pain:  No reports of pain.    Therapy/Group: Individual Therapy  Canary Brim Ivory Broad, PT, DPT, CBIS  01/15/2020, 3:07 PM

## 2020-01-15 NOTE — Progress Notes (Signed)
Pharmacy Antibiotic Note  David Perez is a 79 y.o. male admitted on 01/08/2020 with pneumonia.  Pharmacy has been consulted for vancomycin/cefepime dosing. Previously completed 5 days of cefepime on 2/4 for aspiration PNA.   Remains afebrile, WBC now down to 16.9. SCr currently stable at 1.15. On day#4 of antibiotics since restarted- no new growth on cultures.   Plan: Cefepime 2g IV q12h Vancomycin 750 mg IV Q 24 hrs. Goal AUC 400-550. Expected AUC: 528 SCr used: 1.14 Monitor clinical progress, c/s, renal function F/u de-escalation plan/LOT, vancomycin levels as indicated >>will consider levels this weekend if plan to continue antibiotics    Height: 5\' 8"  (172.7 cm) Weight: 123 lb 14.4 oz (56.2 kg) IBW/kg (Calculated) : 68.4  Temp (24hrs), Avg:97.8 F (36.6 C), Min:97.5 F (36.4 C), Max:98.3 F (36.8 C)  Recent Labs  Lab 01/09/20 1205 01/11/20 0503 01/12/20 0939 01/13/20 0539 01/14/20 0550  WBC 14.0* 18.1* 20.3* 20.1* 16.9*  CREATININE 1.23 1.02 1.14 1.06 1.15    Estimated Creatinine Clearance: 40.7 mL/min (by C-G formula based on SCr of 1.15 mg/dL).    No Known Allergies  Antimicrobials this admission: 2/9 vancomycin >>  2/9 cefepime >>   Dose adjustments this admission: N/A  Microbiology results: L pleural fluid 2/11: no orgs Sputum 2/9: reincubated   Antonietta Jewel, PharmD, BCCCP Clinical Pharmacist  Phone: [phone]  Please check AMION for all [facility] phone numbers After 10:00 PM, call [place] [phone] 01/15/2020 11:03 AM

## 2020-01-15 NOTE — Progress Notes (Signed)
Physical Therapy Session Note  Patient Details  Name: David Perez MRN: [id number] Date of Birth: 02-11-1941  Today's Date: 01/15/2020 PT Individual Time: [id number] PT Individual Time Calculation (min): 18 min   Short Term Goals: Week 1:  PT Short Term Goal 1 (Week 1): Pt will perform functional transfers with CGA PT Short Term Goal 2 (Week 1): Pt will ambulate at least 42ft with CGA using LRAD PT Short Term Goal 3 (Week 1): Pt will ascend/descend 4 steps using L HR only with CGA PT Short Term Goal 4 (Week 1): Pt will demonstrate improvement by at least 38ft on the 6MWT  Skilled Therapeutic Interventions/Progress Updates:  Pt received in w/c reporting significant fatigue, stating "I'm beat". Pt utilized acapella flutter valve with min cuing for exhalation into device to attempt to improve pt's voice quality but no improvement noted after use. Pt asking for purell so provided pt with personal bottle & pt very grateful. Pt willing to perform BLE seated exercises & performs BLE long arc quads then reports fatigue. Therapist offered to assist pt with gait or to get back to bed but pt declines both. Provided pt with Valentine's word search & crossword as he asked for something to keep him mentally stimulated until time for bed. Pt left in w/c with chair alarm donned, all needs in reach.  Therapy Documentation Precautions:  Precautions Precautions: Sternal, Fall Precaution Comments: Reviewed sternal precautions Restrictions Weight Bearing Restrictions: Yes(sternal precautions) General: PT Amount of Missed Time (min): 12 Minutes PT Missed Treatment Reason: Patient fatigue    Therapy/Group: Individual Therapy  Waunita Schooner 01/15/2020, 3:25 PM

## 2020-01-16 ENCOUNTER — Inpatient Hospital Stay (HOSPITAL_COMMUNITY): Payer: Medicare Other | Admitting: Occupational Therapy

## 2020-01-16 LAB — CBC WITH DIFFERENTIAL/PLATELET
Abs Immature Granulocytes: 0.08 10*3/uL — ABNORMAL HIGH (ref 0.00–0.07)
Basophils Absolute: 0.1 10*3/uL (ref 0.0–0.1)
Basophils Relative: 1 %
Eosinophils Absolute: 0.3 10*3/uL (ref 0.0–0.5)
Eosinophils Relative: 2 %
HCT: 33.3 % — ABNORMAL LOW (ref 39.0–52.0)
Hemoglobin: 10.7 g/dL — ABNORMAL LOW (ref 13.0–17.0)
Immature Granulocytes: 1 %
Lymphocytes Relative: 7 %
Lymphs Abs: 1 10*3/uL (ref 0.7–4.0)
MCH: 33.2 pg (ref 26.0–34.0)
MCHC: 32.1 g/dL (ref 30.0–36.0)
MCV: 103.4 fL — ABNORMAL HIGH (ref 80.0–100.0)
Monocytes Absolute: 0.8 10*3/uL (ref 0.1–1.0)
Monocytes Relative: 5 %
Neutro Abs: 12.6 10*3/uL — ABNORMAL HIGH (ref 1.7–7.7)
Neutrophils Relative %: 84 %
Platelets: 289 10*3/uL (ref 150–400)
RBC: 3.22 MIL/uL — ABNORMAL LOW (ref 4.22–5.81)
RDW: 18.9 % — ABNORMAL HIGH (ref 11.5–15.5)
WBC: 14.8 10*3/uL — ABNORMAL HIGH (ref 4.0–10.5)
nRBC: 0.1 % (ref 0.0–0.2)

## 2020-01-16 LAB — BASIC METABOLIC PANEL
Anion gap: 7 (ref 5–15)
BUN: 17 mg/dL (ref 8–23)
CO2: 18 mmol/L — ABNORMAL LOW (ref 22–32)
Calcium: 8.7 mg/dL — ABNORMAL LOW (ref 8.9–10.3)
Chloride: 119 mmol/L — ABNORMAL HIGH (ref 98–111)
Creatinine, Ser: 1.11 mg/dL (ref 0.61–1.24)
GFR calc Af Amer: >60 mL/min (ref >60)
GFR calc non Af Amer: >60 mL/min (ref >60)
Glucose, Bld: 141 mg/dL — ABNORMAL HIGH (ref 70–99)
Potassium: 4.1 mmol/L (ref 3.5–5.1)
Sodium: 144 mmol/L (ref 135–145)

## 2020-01-16 NOTE — Progress Notes (Signed)
Occupational Therapy Weekly Progress Note  Patient Details  Name: David Perez MRN: [id number] Date of Birth: Jul 25, 1941  Beginning of progress report period: January 09, 2020 End of progress report period: January 16, 2020  Today's Date: 01/16/2020 OT Individual Time: 0900-1000 OT Individual Time Calculation (min): 60 min    Patient has met 3 of 3 short term goals.  Patient making gains in mobility and self care.  He is on track for a discharge to home with family this coming week.  Patient continues to demonstrate the following deficits: muscle weakness, decreased cardiorespiratoy endurance and decreased standing balance and decreased postural control and therefore will continue to benefit from skilled OT intervention to enhance overall performance with BADL, iADL and Reduce care partner burden.  Patient progressing toward long term goals..  Continue plan of care.  OT Short Term Goals Week 1:  OT Short Term Goal 1 (Week 1): Pt will perform UB dressing with min A and min cuing to maintain precautions. OT Short Term Goal 1 - Progress (Week 1): Met OT Short Term Goal 2 (Week 1): Pt will perform bathing tasks with min A overall. OT Short Term Goal 2 - Progress (Week 1): Met OT Short Term Goal 3 (Week 1): PT will perform LB dressing with min A overall. OT Short Term Goal 3 - Progress (Week 1): Met Week 2:  OT Short Term Goal 1 (Week 2): STG = LTG  Skilled Therapeutic Interventions/Progress Updates:    Patient in bed, alert and ready for therapy.  He denies pain and states that he slept well last night.  Bed mobility with CS, ambulation in room with RW CS/CGA.  He completed bathing w/c level at sink with CS, min cues for sternal precautions and increased time.  Donning OH shirt CS, pants CS.  He completes oral care and grooming tasks w/c level with CS, CGA for activities in stance.  He ambulated in hall way approx 200 ft with CGA using RW.  He returned to w/c at close of session, seat belt  alarm on and call bell/tray table in reach.    Therapy Documentation Precautions:  Precautions Precautions: Sternal, Fall Precaution Comments: Reviewed sternal precautions Restrictions Weight Bearing Restrictions: Yes(sternal precautions) General:   Vital Signs: Therapy Vitals Temp: 98.2 F (36.8 C) Temp Source: Oral Pulse Rate: (!) 57 Resp: 16 BP: (!) 111/55 Patient Position (if appropriate): Lying Oxygen Therapy SpO2: 95 % O2 Device: Room Air   Other Treatments:     Therapy/Group: Individual Therapy  [name] 01/16/2020, 7:44 AM

## 2020-01-16 NOTE — Progress Notes (Signed)
[place] PHYSICAL MEDICINE & REHABILITATION PROGRESS NOTE   Subjective/Complaints:  Patient slept very well last night  ROS: pt denies cardiac CP, SOB, abd pain, HA, vision changes, mood changes, N/V/C ; still loose stools.  Objective:   DG Chest 2 View  Result Date: 01/14/2020 CLINICAL DATA:  Status post left thoracentesis EXAM: CHEST - 2 VIEW COMPARISON:  01/12/2020 FINDINGS: Cardiac shadow is enlarged. Postsurgical changes are noted. 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 Significant reduction in left-sided pleural effusion is noted following thoracentesis. No pneumothorax is noted. Improved vascular congestion is seen when compared with the prior study. IMPRESSION: No pneumothorax following left thoracentesis. Electronically Signed   By: Inez Catalina M.D.   On: 01/14/2020 14:30   IR THORACENTESIS ASP PLEURAL SPACE W/IMG GUIDE  Result Date: 01/14/2020 INDICATION: Shortness of breath. Left-sided pleural effusion. Status post open heart surgery. Request diagnostic and therapeutic thoracentesis. EXAM: ULTRASOUND GUIDED LEFT THORACENTESIS MEDICATIONS: None. COMPLICATIONS: None immediate. PROCEDURE: An ultrasound guided thoracentesis was thoroughly discussed with the patient and questions answered. The benefits, risks, alternatives and complications were also discussed. The patient understands and wishes to proceed with the procedure. Written consent was obtained. Ultrasound was performed to localize and mark an adequate pocket of fluid in the left chest. The area was then prepped and draped in the normal sterile fashion. 1% Lidocaine was used for local anesthesia. Under ultrasound guidance a 6 Fr Safe-T-Centesis catheter was introduced. Thoracentesis was performed. The catheter was removed and a dressing applied. FINDINGS: A total of approximately 1.4 L of dark bloody fluid was removed. Samples were sent to the laboratory as requested by the clinical team. IMPRESSION: Successful ultrasound  guided left thoracentesis yielding 1.4 L of pleural fluid. Read by: Ascencion Dike PA-C Electronically Signed   By: Corrie Mckusick D.O.   On: 01/14/2020 14:20   Recent Labs    01/14/20 0550 01/16/20 0816  WBC 16.9* 14.8*  HGB 11.6* 10.7*  HCT 35.7* 33.3*  PLT 335 289   Recent Labs    01/14/20 0550  NA 143  K 3.7  CL 108  CO2 23  GLUCOSE 112*  BUN 14  CREATININE 1.15  CALCIUM 8.6*    Intake/Output Summary (Last 24 hours) at 01/16/2020 0913 Last data filed at 01/16/2020 0700 Gross per 24 hour  Intake 670 ml  Output 150 ml  Net 520 ml     Physical Exam: Vital Signs Blood pressure (!) 112/54, pulse 62, temperature 98.2 F (36.8 C), temperature source Oral, resp. rate 16, height 5\' 8"  (1.727 m), weight 56.2 kg, SpO2 96 %.  Physical Exam  General: Alert and oriented x 3, sitting up in bed; appears better color and slightly improved/comofortable breathing, NAD HEENT: conjugate gaze; trachea midline Neck: Supple with no JVD Heart: Reg rate and rhythm. Mild murmur heard; no R/G Chest: better air movement; esp at L base; no W/R/R Abdomen: Soft, non-tender, non-distended, (+) BS Extremities: No clubbing, cyanosis, or edema. Pulses are 2+ Skin: Surgical incision on chest looks good, drain sites healing well Neuro: Pt is cognitively appropriate with normal insight, memory, and awareness.  Sensory exam is normal. Reflexes are 2+ in all 4's. Fine motor coordination is intact. No tremors. Motor function is grossly 5/5.  Musculoskeletal:  Parascapular wasting around the infraspinatus supraspinatus fossa decreased shoulder range of motion Psych: flat affect, but smiled once.    Assessment/Plan: 1. 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  Physiatrist is providing close team supervision and 24 hour management of active medical problems listed below.  Physiatrist  and rehab team continue to assess barriers to discharge/monitor patient progress toward functional and medical goals  Care Tool:  Bathing    Body parts bathed by patient: Right arm, Left arm, Chest, Abdomen, Front perineal area, Buttocks, Right upper leg, Left upper leg, Right lower leg, Left lower leg, Face   Body parts bathed by helper: Chest, Abdomen, Right lower leg, Left lower leg     Bathing assist Assist Level: Minimal Assistance - Patient > 75%     Upper Body Dressing/Undressing Upper body dressing Upper body dressing/undressing activity did not occur (including orthotics): N/A What is the patient wearing?: Pull over shirt    Upper body assist Assist Level: Supervision/Verbal cueing    Lower Body Dressing/Undressing Lower body dressing      What is the patient wearing?: Pants     Lower body assist Assist for lower body dressing: Minimal Assistance - Patient > 75%     Toileting Toileting    Toileting assist Assist for toileting: Contact Guard/Touching assist     Transfers Chair/bed transfer  Transfers assist     Chair/bed transfer assist level: Contact Guard/Touching assist     Locomotion Ambulation   Ambulation assist      Assist level: Contact Guard/Touching assist Assistive device: Walker-rolling Max distance: 150'   Walk 10 feet activity   Assist     Assist level: Contact Guard/Touching assist Assistive device: Walker-rolling   Walk 50 feet activity   Assist    Assist level: Contact Guard/Touching assist Assistive device: Walker-rolling    Walk 150 feet activity   Assist Walk 150 feet activity did not occur: Safety/medical concerns  Assist level: Contact Guard/Touching assist Assistive device: Walker-rolling    Walk 10 feet on uneven surface  activity   Assist     Assist level: Contact Guard/Touching assist Assistive device: Walker-rolling   Wheelchair     Assist Will patient use wheelchair at discharge?:  No(unless needed for dependent transport in community due to limited ambulation distance)             Wheelchair 50 feet with 2 turns activity    Assist            Wheelchair 150 feet activity     Assist          Blood pressure (!) 112/54, pulse 62, temperature 98.2 F (36.8 C), temperature source Oral, resp. rate 16, height 5\' 8"  (1.727 m), weight 56.2 kg, SpO2 96 %.  Medical Problem List and Plan: 1.  Impaired mobility and ADLs secondary to cardiac debility s/p CABGx3.             -patient may shower             -ELOS/Goals: modI in PT, OT, I in SLP             -2 weeks colchicine for pericardial irritation  -Continue CIR PT and OT 2.  Antithrombotics: -DVT/anticoagulation: Starting Eliquis today (2/5)              -antiplatelet therapy: On ASA 3. Pain Management: Denies pain, not receiving any pain medications.  4. Mood: LCSW to follow for evaluations and support.              -antipsychotic agents:  N/A 5. Neuropsych: This patient is capable of making decisions on his  own behalf. 6. Skin/Wound Care: Monitor incisions for healing.  7. Fluids/Electrolytes/Nutrition: Offer supplements between meals to promote wound healing.  8. AFib: Continue to monitor HR tid and with increase in activity. Continue Amiodarone bid 9. Persistent Leucocytosis: WBC up to 16,000 +--infectious work up negative s/p IV Cefepime.   Thoracentesis performed by IR on 2/11, positive WBCs, no growth on culture thus far 10 Acute renal failure with hypernatremia: Need to encourage fluid intake.   2.6- Cr down to 1.23 and BUN down to 23- recovering- Na 145- so improving- con't regimen  2/8: Na increased to 147.   2/10- Na back down and Cr down to 1.06- improving in spite of Cefipime and Vanc- will need to follow/monitor to make sure doesn't cause renal impairment on IV ABX.  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 On ASA, Losartan, digoxin, coreg,  Lipitor.   Filed Weights   01/12/20 0455 01/13/20 0602 01/15/20 0438  Weight: 53.8 kg 57.5 kg 56.2 kg    2/13 wt range 54-57kg 12. Thrombocytopenia: Has resolved.  13. Shortness of breath: Encourage IS. Continue  14. Constipation: Resolved. Can DC daily docusate, miralax, and dulcolax. Patient is refusing and moving bowels regularly.   2/6- having loose stools- due to colchicine likely- will d/c Colace and add Fibercon daily  2/7- improved per pt.   2/8: continues to improve.  2/11- looser stools- likely due to IV ABX- will increase Fibercon dose and if doesn't improve, will check CDIff- doesn't smell or sound like C Diff- however not diagnostic- will check   15. Hypernatremia  2/9- was 147 yesterday- will recheck today and start IVFs if needed.   - is 144 today  2/10- labs in AM 16. Insomnia  Improved last night  2/12-increase trazodone to 50 mg QHS  17.  Disposition: Lives alone, but has supportive family, including daughter, nearby.      LOS: 8 days A FACE TO FACE EVALUATION WAS PERFORMED  Charlett Blake 01/16/2020, 9:13 AM

## 2020-01-17 ENCOUNTER — Inpatient Hospital Stay (HOSPITAL_COMMUNITY): Payer: Medicare Other

## 2020-01-17 MED ORDER — SODIUM CHLORIDE 0.9 % IV SOLN: INTRAVENOUS | Status: DC | PRN

## 2020-01-17 NOTE — Progress Notes (Signed)
Pt monitored q1h throughout shift for safety. Bed in lowest position with wheels locked and call bell within reach.

## 2020-01-17 NOTE — Progress Notes (Signed)
Physical Therapy Weekly Progress Note  Patient Details  Name: Siddh Vandeventer MRN: [id number] Date of Birth: 1941/10/02  Beginning of progress report period: January 09, 2020 End of progress report period: January 17, 2020  Today's Date: 01/17/2020 PT Individual Time: 5945-8592 PT Individual Time Calculation (min): 60 min   Patient has met 4 of 4 short term goals.  Patient with slow progress this week due to increased fatigue secondary to B lung infiltrates. Noting improved function and activity tolerance following thoracentesis on 2/11. He currently requires supervision for bed mobility, transfers, gait with a RW 334 feet, CGA-close supervision for gait without an AD for safety, and CGA for 8 steps with 1 rail.   Patient continues to demonstrate the following deficits muscle weakness, decreased cardiorespiratoy endurance and decreased standing balance, decreased postural control and decreased balance strategies and therefore will continue to benefit from skilled PT intervention to increase functional independence with mobility.  Patient progressing toward long term goals.  Continue plan of care.  PT Short Term Goals Week 1:  PT Short Term Goal 1 (Week 1): Pt will perform functional transfers with CGA PT Short Term Goal 1 - Progress (Week 1): Met PT Short Term Goal 2 (Week 1): Pt will ambulate at least 20f with CGA using LRAD PT Short Term Goal 2 - Progress (Week 1): Met PT Short Term Goal 3 (Week 1): Pt will ascend/descend 4 steps using L HR only with CGA PT Short Term Goal 3 - Progress (Week 1): Progressing toward goal PT Short Term Goal 4 (Week 1): Pt will demonstrate improvement by at least 69fon the 6MWT PT Short Term Goal 4 - Progress (Week 1): Met Week 2:  PT Short Term Goal 1 (Week 2): STG=LTG due to ELOS.  Skilled Therapeutic Interventions/Progress Updates:     Session 1: Patient in recliner in room eating breakfast upon PT arrival at  0915, asked to re-arrange schedule so  that he could take his time eating breakfast, PT able to accomodates patient's request. Returned to patient in recliner in room finished with breakfast. Patient alert and agreeable to PT session. Patient denied pain during session. Focused session on reassessment of outcome measures at 1 week. Patient's daughter arrived 5 min into session and was present for review of patient's progress and outcome measures at end of session.   Therapeutic Activity:  Transfers: Patient performed sit to/from stand x5 with supervision placing hands on thighs without cues to maintain sternal precautions. Provided verbal cues for forward weight shift x1. Five times Sit to Stand Test (FTSS) Method: Use a straight back chair with a solid seat that is 16-18" high. Ask participant to sit on the chair with arms folded across their chest.   Instructions: "Stand up and sit down as quickly as possible 5 times, keeping your arms folded across your chest."   Measurement: Stop timing when the participant stands the 5th time.  TIME: _15.8_____ (in seconds)  Times > 13.6 seconds is associated with increased disability and morbidity (Guralnik, 2000) Times > 15 seconds is predictive of recurrent falls in healthy individuals aged 6580nd older (Buatois, et al., 2008) Normal performance values in community dwelling individuals aged 6014nd older (Bohannon, 2006): o 60-69 years: 11.4 seconds o 70-79 years: 12.6 seconds o 80-89 years: 14.8 seconds  MCID: ? 2.3 seconds for Vestibular Disorders (Meretta, 2006)  Gait Training:  Patient ambulated 334 during the Six Min Walk Test, see details below, 224 feet, and 105 feet using a RW  on the longer two trials and without an AD for the shortest distance with close supervision for safety. Ambulated with decreased gait speed, more prevalent without an AD, decreased step length and height R>L, increased B hip and knee flexion in stance, forward trunk lean, and downward head gaze. Provided  verbal cues for erect posture, paced breathing, increased step height and DF on R for increased foot clearance, and looking ahead and to his surroundings for safety.  Six Minute Walk Test: Distance: 334 ft = 101.8 meters HR: 60bpm Modified RPE: 5/10 Time: 6 min Device: RW Indicated decreased endurance based on score when compared to age matched norms.   Timed Up and Go (TUG) Patient performed the TUG with the following instructions: "Stand, walk 10 feet around a cone and 10 feet back to the chair, then sit as quickly and as safely as possible." Time: 16.7 sec Indicates patient is at increased fall risk due to score >14 sec.   Patient in recliner in room at end of session with breaks locked and all needs within reach. Educated patient and his daughter on results and interpretation of all outcome measures and on fall risk/prevention, home modifications to prevent falls, and activation of emergency services in the event of a fall at end of session.   Session 2: Patient in recliner with his daughter in the room upon PT arrival. Patient alert and agreeable to PT session. Patient denied pain during session. Discussed d/c planning and family education. Patient's daughter will schedule a time with CSW once she discusses with her sister. Educated patient and his daughter on goals for this week for reaching mod I goals by d/c on 2/19.   Therapeutic Activity: Transfers: Patient performed sit to/from stand x10 with supervision as above. He performed a standing toilet transfer and was continent of bladder with distant supervision during session.   Gait Training:  Patient ascended/descended 8 steps using L rail with CGA-close supervision for saety. Performed step-to with intermittent reciprocal gait pattern. Provided cues for technique and sequencing, recommended step-to gait pattern for safety and increased balance with activity.  Patient ambulated up/down a ramp, over 10 feet of mulch (unlevel surface),  and up/down a curb to simulate community ambulation over unlevel surfaces x2 with CGA-min A due to minor LOB x3 with intermittnet use of a rail on first trial and without use of a rail on second trial. Provided cues for technique throughout.  Wheelchair Mobility:  Patient was transported in the w/c with total A throughout session for energy conservation and time management.  Neuromuscular Re-ed: Patient performed the Berg Balance Scale: Patient demonstrates increased fall risk as noted by score of 43/56 on Berg Balance Scale.  (<36= high risk for falls, close to 100%; 37-45 significant >80%; 46-51 moderate >50%; 52-55 lower >25%) Discussed results and interpretation following assessment. Patient and PT agreed on a goal of a score > or equal to 50 on 11/18 prior to d/c.   Patient in recliner with RN in room  at end of session with breaks locked and all needs within reach.    Therapy Documentation Precautions:  Precautions Precautions: Sternal, Fall Precaution Comments: Reviewed sternal precautions Restrictions Weight Bearing Restrictions: Yes(sternal precautions) General:   Vital Signs: Therapy Vitals Temp: 98.1 F (36.7 C) Pulse Rate: (!) 56 Resp: 18 BP: 111/62 Patient Position (if appropriate): Sitting Oxygen Therapy SpO2: 99 % O2 Device: Room Air  Balance: Balance Balance Assessed: Yes Standardized Balance Assessment Standardized Balance Assessment: Oceanographer Test [employer]  Test Sit to Stand: Able to stand without using hands and stabilize independently Standing Unsupported: Able to stand safely 2 minutes Sitting with Back Unsupported but Feet Supported on Floor or Stool: Able to sit safely and securely 2 minutes Stand to Sit: Sits safely with minimal use of hands Transfers: Able to transfer safely, minor use of hands Standing Unsupported with Eyes Closed: Able to stand 10 seconds safely Standing Ubsupported with Feet Together: Able to place feet together  independently and stand 1 minute safely From Standing, Reach Forward with Outstretched Arm: Can reach forward >5 cm safely (2") From Standing Position, Pick up Object from Floor: Able to pick up shoe, needs supervision From Standing Position, Turn to Look Behind Over each Shoulder: Looks behind from both sides and weight shifts well Turn 360 Degrees: Needs close supervision or verbal cueing Standing Unsupported, Alternately Place Feet on Step/Stool: Needs assistance to keep from falling or unable to try Standing Unsupported, One Foot in Front: Able to plae foot ahead of the other independently and hold 30 seconds Standing on One Leg: Able to lift leg independently and hold equal to or more than 3 seconds Total Score: 43/56   Therapy/Group: Individual Therapy  Haadiya Frogge L Oprah Camarena PT, DPT  01/17/2020, 3:53 PM

## 2020-01-17 NOTE — Progress Notes (Signed)
[place] PHYSICAL MEDICINE & REHABILITATION PROGRESS NOTE   Subjective/Complaints:  No issues overnight patient without significant cough.  ROS: pt denies cardiac CP, SOB, abd pain, HA, vision changes, mood changes, N/V/C ; still loose stools.  Objective:   No results found. Recent Labs    01/16/20 0816  WBC 14.8*  HGB 10.7*  HCT 33.3*  PLT 289   Recent Labs    01/16/20 0816  NA 144  K 4.1  CL 119*  CO2 18*  GLUCOSE 141*  BUN 17  CREATININE 1.11  CALCIUM 8.7*    Intake/Output Summary (Last 24 hours) at 01/17/2020 [id number] Last data filed at 01/17/2020 0500 Gross per 24 hour  Intake 910 ml  Output 178 ml  Net 732 ml     Physical Exam: Vital Signs Blood pressure (!) 123/50, pulse 66, temperature 97.9 F (36.6 C), resp. rate 18, height 5\' 8"  (1.727 m), weight 52.5 kg, SpO2 93 %.  Physical Exam  General: Alert and oriented x 3, sitting up in bed; appears better color and slightly improved/comofortable breathing, NAD HEENT: conjugate gaze; trachea midline Neck: Supple with no JVD Heart: Reg rate and rhythm. Mild murmur heard; no R/G Chest: better air movement; esp at L base; no W/R/R Abdomen: Soft, non-tender, non-distended, (+) BS Extremities: No clubbing, cyanosis, or edema. Pulses are 2+ Skin: Surgical incision on chest looks good, drain sites healing well Neuro: Pt is cognitively appropriate with normal insight, memory, and awareness.  Sensory exam is normal. Reflexes are 2+ in all 4's. Fine motor coordination is intact. No tremors. Motor function is grossly 5/5.  Musculoskeletal:  Parascapular wasting around the infraspinatus supraspinatus fossa decreased shoulder range of motion Psych: flat affect, but smiled once.    Assessment/Plan: 1. 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  Physiatrist is providing close team supervision and 24 hour  management of active medical problems listed below.  Physiatrist and rehab team continue to assess barriers to discharge/monitor patient progress toward functional and medical goals  Care Tool:  Bathing    Body parts bathed by patient: Right arm, Left arm, Chest, Abdomen, Front perineal area, Buttocks, Right upper leg, Left upper leg, Right lower leg, Left lower leg, Face   Body parts bathed by helper: Chest, Abdomen, Right lower leg, Left lower leg     Bathing assist Assist Level: Supervision/Verbal cueing     Upper Body Dressing/Undressing Upper body dressing Upper body dressing/undressing activity did not occur (including orthotics): N/A What is the patient wearing?: Pull over shirt    Upper body assist Assist Level: Supervision/Verbal cueing    Lower Body Dressing/Undressing Lower body dressing      What is the patient wearing?: Pants     Lower body assist Assist for lower body dressing: Supervision/Verbal cueing     Toileting Toileting    Toileting assist Assist for toileting: Contact Guard/Touching assist     Transfers Chair/bed transfer  Transfers assist     Chair/bed transfer assist level: Contact Guard/Touching assist     Locomotion Ambulation   Ambulation assist      Assist level: Contact Guard/Touching assist Assistive device: Walker-rolling Max distance: 150'   Walk 10 feet activity   Assist     Assist level: Contact Guard/Touching assist Assistive device: Walker-rolling   Walk 50 feet activity   Assist    Assist level: Contact Guard/Touching assist Assistive device: Walker-rolling  Walk 150 feet activity   Assist Walk 150 feet activity did not occur: Safety/medical concerns  Assist level: Contact Guard/Touching assist Assistive device: Walker-rolling    Walk 10 feet on uneven surface  activity   Assist     Assist level: Contact Guard/Touching assist Assistive device: Walker-rolling    Wheelchair     Assist Will patient use wheelchair at discharge?: No             Wheelchair 50 feet with 2 turns activity    Assist            Wheelchair 150 feet activity     Assist          Blood pressure (!) 123/50, pulse 66, temperature 97.9 F (36.6 C), resp. rate 18, height 5\' 8"  (1.727 m), weight 52.5 kg, SpO2 93 %.  Medical Problem List and Plan: 1.  Impaired mobility and ADLs secondary to cardiac debility s/p CABGx3.             -patient may shower             -ELOS/Goals: modI in PT, OT, I in SLP             -2 weeks colchicine for pericardial irritation  -Continue CIR PT and OT 2.  Antithrombotics: -DVT/anticoagulation: Starting Eliquis today (2/5)              -antiplatelet therapy: On ASA 3. Pain Management: Denies pain, not receiving any pain medications.  4. Mood: LCSW to follow for evaluations and support.              -antipsychotic agents:  N/A 5. Neuropsych: This patient is capable of making decisions on his own behalf. 6. Skin/Wound Care: Monitor incisions for healing.  7. Fluids/Electrolytes/Nutrition: Offer supplements between meals to promote wound healing.  8. AFib: Continue to monitor HR tid and with increase in activity. Continue Amiodarone bid 9. Persistent Leucocytosis: WBC up to 16,000 +--infectious work up negative s/p IV Cefepime.   Thoracentesis performed by IR on 2/11, positive WBCs, no growth on culture thus far, repeat CBC in a.m. 10 Acute renal failure with hypernatremia: Need to encourage fluid intake.   2.6- Cr down to 1.23 and BUN down to 23- recovering- Na 145- so improving- con't regimen  2/8: Na increased to 147.   2/10- Na back down and Cr down to 1.06- improving in spite of Cefipime and Vanc- will need to follow/monitor to make sure doesn't cause renal impairment on IV ABX.  Discussed with pharmacy, given that MRSA is negative will stop Vanco and continue cefepime 11. Acute systolic CHF:  Monitor for signs of  overload and check daily weights. On ASA, Losartan, digoxin, coreg, Lipitor.   Filed Weights   01/13/20 0602 01/15/20 0438 01/17/20 0500  Weight: 57.5 kg 56.2 kg 52.5 kg    2/14 wt range 52.5-57kg 12. Thrombocytopenia: Has resolved.  13. Shortness of breath: Encourage IS. Continue  14. Constipation: Resolved. Can DC daily docusate, miralax, and dulcolax. Patient is refusing and moving bowels regularly.   2/6- having loose stools- due to colchicine likely- will d/c Colace and add Fibercon daily  2/7- improved per pt.   2/8: continues to improve.  2/11- looser stools- likely due to IV ABX- will increase Fibercon dose and if doesn't improve, will check CDIff- doesn't smell or sound like C Diff- however not diagnostic- will check   15. Hypernatremia  2/9- was 147 improved to 144 on 2/13, repeat in  a.m.    16. Insomnia  Improved last night, patient quite pleased with his sleep at this time  2/12-increase trazodone to 50 mg QHS  17.  Disposition: Lives alone, but has supportive family, including daughter, nearby.      LOS: 9 days A FACE TO FACE EVALUATION WAS PERFORMED  Charlett Blake 01/17/2020, 9:22 AM

## 2020-01-18 ENCOUNTER — Inpatient Hospital Stay (HOSPITAL_COMMUNITY): Payer: Medicare Other | Admitting: Occupational Therapy

## 2020-01-18 ENCOUNTER — Inpatient Hospital Stay (HOSPITAL_COMMUNITY): Payer: Medicare Other

## 2020-01-18 ENCOUNTER — Ambulatory Visit: Payer: Self-pay | Admitting: Cardiothoracic Surgery

## 2020-01-18 DIAGNOSIS — I5022 Chronic systolic (congestive) heart failure: Secondary | ICD-10-CM

## 2020-01-18 DIAGNOSIS — I5023 Acute on chronic systolic (congestive) heart failure: Secondary | ICD-10-CM

## 2020-01-18 LAB — BASIC METABOLIC PANEL
Anion gap: 6 (ref 5–15)
BUN: 18 mg/dL (ref 8–23)
CO2: 19 mmol/L — ABNORMAL LOW (ref 22–32)
Calcium: 8.6 mg/dL — ABNORMAL LOW (ref 8.9–10.3)
Chloride: 117 mmol/L — ABNORMAL HIGH (ref 98–111)
Creatinine, Ser: 1.13 mg/dL (ref 0.61–1.24)
GFR calc Af Amer: >60 mL/min (ref >60)
GFR calc non Af Amer: >60 mL/min (ref >60)
Glucose, Bld: 135 mg/dL — ABNORMAL HIGH (ref 70–99)
Potassium: 3.7 mmol/L (ref 3.5–5.1)
Sodium: 142 mmol/L (ref 135–145)

## 2020-01-18 LAB — CBC WITH DIFFERENTIAL/PLATELET
Abs Immature Granulocytes: 0.07 10*3/uL (ref 0.00–0.07)
Basophils Absolute: 0.1 10*3/uL (ref 0.0–0.1)
Basophils Relative: 1 %
Eosinophils Absolute: 0.3 10*3/uL (ref 0.0–0.5)
Eosinophils Relative: 3 %
HCT: 33.5 % — ABNORMAL LOW (ref 39.0–52.0)
Hemoglobin: 10.9 g/dL — ABNORMAL LOW (ref 13.0–17.0)
Immature Granulocytes: 1 %
Lymphocytes Relative: 7 %
Lymphs Abs: 1 10*3/uL (ref 0.7–4.0)
MCH: 33.4 pg (ref 26.0–34.0)
MCHC: 32.5 g/dL (ref 30.0–36.0)
MCV: 102.8 fL — ABNORMAL HIGH (ref 80.0–100.0)
Monocytes Absolute: 0.9 10*3/uL (ref 0.1–1.0)
Monocytes Relative: 7 %
Neutro Abs: 10.8 10*3/uL — ABNORMAL HIGH (ref 1.7–7.7)
Neutrophils Relative %: 81 %
Platelets: 298 10*3/uL (ref 150–400)
RBC: 3.26 MIL/uL — ABNORMAL LOW (ref 4.22–5.81)
RDW: 18.8 % — ABNORMAL HIGH (ref 11.5–15.5)
WBC: 13.2 10*3/uL — ABNORMAL HIGH (ref 4.0–10.5)
nRBC: 0.2 % (ref 0.0–0.2)

## 2020-01-18 LAB — GLUCOSE, CAPILLARY: Glucose-Capillary: 108 mg/dL — ABNORMAL HIGH (ref 70–99)

## 2020-01-18 MED ORDER — LEVALBUTEROL HCL 0.63 MG/3ML IN NEBU
0.6300 mg | INHALATION_SOLUTION | Freq: Four times a day (QID) | RESPIRATORY_TRACT | Status: DC | PRN
  Administered 2020-01-19 (×2): 0.63 mg via RESPIRATORY_TRACT
  Filled 2020-01-18 (×2): qty 3

## 2020-01-18 MED ORDER — COLCHICINE 0.6 MG PO TABS
0.6000 mg | ORAL_TABLET | Freq: Every day | ORAL | Status: AC
  Administered 2020-01-19 – 2020-01-20 (×2): 0.6 mg via ORAL
  Filled 2020-01-18 (×2): qty 1

## 2020-01-18 MED ORDER — FUROSEMIDE 40 MG PO TABS: 40.0000 mg | ORAL_TABLET | Freq: Once | ORAL | Status: DC

## 2020-01-18 MED ORDER — FUROSEMIDE 10 MG/ML IJ SOLN
40.0000 mg | Freq: Once | INTRAMUSCULAR | Status: AC
  Administered 2020-01-18: 40 mg via INTRAVENOUS
  Filled 2020-01-18: qty 4

## 2020-01-18 MED ORDER — FUROSEMIDE 10 MG/ML IJ SOLN
40.0000 mg | Freq: Two times a day (BID) | INTRAMUSCULAR | Status: DC
  Administered 2020-01-18 – 2020-01-19 (×2): 40 mg via INTRAVENOUS
  Filled 2020-01-18 (×2): qty 4

## 2020-01-18 MED ORDER — LEVALBUTEROL HCL 0.63 MG/3ML IN NEBU
0.6300 mg | INHALATION_SOLUTION | Freq: Four times a day (QID) | RESPIRATORY_TRACT | Status: DC
  Administered 2020-01-18: 0.63 mg via RESPIRATORY_TRACT
  Filled 2020-01-18: qty 3

## 2020-01-18 NOTE — Progress Notes (Signed)
Pharmacy Antibiotic Note  David Perez is a 79 y.o. male admitted on 01/08/2020 with pneumonia.  Pharmacy has been consulted for vancomycin/cefepime dosing. Previously completed 5 days of cefepime on 2/4 for aspiration PNA.   Remains afebrile Day # 7  Plan: Cefepime 2g IV q12h Consider adding stopping antibiotics   Height: 5\' 8"  (172.7 cm) Weight: 119 lb 0.8 oz (54 kg) IBW/kg (Calculated) : 68.4  Temp (24hrs), Avg:98.1 F (36.7 C), Min:98.1 F (36.7 C), Max:98.1 F (36.7 C)  Recent Labs  Lab 01/12/20 0939 01/13/20 0539 01/14/20 0550 01/16/20 0816 01/18/20 0730  WBC 20.3* 20.1* 16.9* 14.8* 13.2*  CREATININE 1.14 1.06 1.15 1.11 1.13    Estimated Creatinine Clearance: 39.2 mL/min (by C-G formula based on SCr of 1.13 mg/dL).    No Known Allergies  Antimicrobials this admission: 2/9 vancomycin >> 2/13 2/9 cefepime >>   Dose adjustments this admission: N/A  Microbiology results: L pleural fluid 2/11: no orgs Sputum 2/9: reincubated   Thank you Anette Guarneri, PharmD  Please check AMION for all [place] phone numbers After 10:00 PM, call [place] 01/18/2020 9:51 AM

## 2020-01-18 NOTE — Progress Notes (Signed)
[place] PHYSICAL MEDICINE & REHABILITATION PROGRESS NOTE   Subjective/Complaints:  Pt reports feels MUCH more SOB today- walked 300+ ft yesterday however, couldn't even keep still and wear a mask to get CXR this AM.  Very SOB which is new since thoracentesis- wasn't even feeling this bad prior to thoracentesis.   CXR showed pulmonary edema as well as recurrent pulm effusions on L>R again, in spite of thoracentesis last week pulling out 1.4L.    ROS: pt denies cardiac CP, endorses severe SOB, abd pain, HA, vision changes, mood changes, N/V/C ; still loose stools but better overall  Objective:   DG Chest 2 View  Result Date: 01/18/2020 CLINICAL DATA:  Left-sided pleural effusion. EXAM: CHEST - 2 VIEW COMPARISON:  01/14/2020 FINDINGS: Grossly unchanged cardiac silhouette and mediastinal contours post median sternotomy, CABG and valve repair. Interval increase in small right and moderate-sized left-sided pleural effusion with associated worsening bibasilar heterogeneous/consolidative opacities. The lungs remain hyperexpanded. Pulmonary vasculature appears slightly less distinct than present examination. No pneumothorax. No acute osseous abnormalities. Accentuated thoracic kyphosis. IMPRESSION: Suspected worsening pulmonary edema superimposed on advanced emphysematous change with small right and moderate-sized left pleural effusions and associated bibasilar opacities, likely atelectasis. Electronically Signed   By: Sandi Mariscal M.D.   On: 01/18/2020 07:26   Recent Labs    01/16/20 0816 01/18/20 0730  WBC 14.8* 13.2*  HGB 10.7* 10.9*  HCT 33.3* 33.5*  PLT 289 298   Recent Labs    01/16/20 0816 01/18/20 0730  NA 144 142  K 4.1 3.7  CL 119* 117*  CO2 18* 19*  GLUCOSE 141* 135*  BUN 17 18  CREATININE 1.11 1.13  CALCIUM 8.7* 8.6*    Intake/Output Summary (Last 24 hours) at 01/18/2020 1551 Last data filed at 01/18/2020 1439 Gross per 24 hour  Intake 996.07 ml  Output 125 ml  Net  871.07 ml     Physical Exam: Vital Signs Blood pressure (!) 98/53, pulse (!) 59, temperature 98.1 F (36.7 C), resp. rate 18, height 5\' 8"  (1.727 m), weight 54 kg, SpO2 (!) 83 %. LABS and VITALS AND NURSING/STAFF NOTES REVIEWED Physical Exam  General: Alert and oriented; sitting up at bedside chair; appears SOB; using more accessory muscles to breathe,cognizant;  NAD HEENT: conjugate gaze; trachea midline Neck: Supple with no JVD Heart: Reg rate and rhythm. Mild murmur heard; no R/G Chest: very decreased breath sounds at bases R>L; coarse even in apex B/L; using accessory muscles to breathe; increased resp rate;  Abdomen: Soft, non-tender, non-distended, (+) BS Extremities: No clubbing, cyanosis, or edema. Pulses are 2+ Skin: Surgical incision on chest looks good, drain sites healing well Neuro: Pt is cognitively appropriate with normal insight, memory, and awareness.  Sensory exam is normal. Reflexes are 2+ in all 4's. Fine motor coordination is intact. No tremors. Motor function is grossly 5/5.  Musculoskeletal:  Parascapular wasting around the infraspinatus supraspinatus fossa decreased shoulder range of motion Psych: unhappy affect   Assessment/Plan: 1. 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  Physiatrist is providing close team supervision and 24 hour management of active medical problems listed below.  Physiatrist and rehab team continue to assess barriers to discharge/monitor patient progress toward functional and medical goals  Care Tool:  Bathing    Body parts bathed by patient: Right arm, Left arm, Chest, Abdomen, Front perineal area, Buttocks, Right upper leg, Left upper  leg, Right lower leg, Left lower leg, Face   Body parts bathed by helper: Chest, Abdomen, Right lower leg, Left lower leg     Bathing assist Assist Level: Supervision/Verbal  cueing     Upper Body Dressing/Undressing Upper body dressing Upper body dressing/undressing activity did not occur (including orthotics): N/A What is the patient wearing?: Pull over shirt    Upper body assist Assist Level: Supervision/Verbal cueing    Lower Body Dressing/Undressing Lower body dressing      What is the patient wearing?: Pants     Lower body assist Assist for lower body dressing: Supervision/Verbal cueing     Toileting Toileting    Toileting assist Assist for toileting: Contact Guard/Touching assist     Transfers Chair/bed transfer  Transfers assist     Chair/bed transfer assist level: Supervision/Verbal cueing     Locomotion Ambulation   Ambulation assist      Assist level: Contact Guard/Touching assist Assistive device: No Device Max distance: 8'(limited by SOB and fatigue)   Walk 10 feet activity   Assist     Assist level: Supervision/Verbal cueing Assistive device: No Device   Walk 50 feet activity   Assist    Assist level: Supervision/Verbal cueing Assistive device: Walker-rolling    Walk 150 feet activity   Assist Walk 150 feet activity did not occur: Safety/medical concerns  Assist level: Supervision/Verbal cueing Assistive device: Walker-rolling    Walk 10 feet on uneven surface  activity   Assist     Assist level: Minimal Assistance - Patient > 75% Assistive device: Aeronautical engineer Will patient use wheelchair at discharge?: No             Wheelchair 50 feet with 2 turns activity    Assist            Wheelchair 150 feet activity     Assist          Blood pressure (!) 98/53, pulse (!) 59, temperature 98.1 F (36.7 C), resp. rate 18, height 5\' 8"  (1.727 m), weight 54 kg, SpO2 (!) 83 %.  Medical Problem List and Plan: 1.  Impaired mobility and ADLs secondary to cardiac debility s/p CABGx3.             -patient may shower             -ELOS/Goals: modI  in PT, OT, I in SLP             -2 weeks colchicine for pericardial irritation  -Continue CIR PT and OT  2/15- might not get out of here Friday like planned due to medical issues- will attempt to improve SOB 2.  Antithrombotics: -DVT/anticoagulation: Starting Eliquis today (2/5)              -antiplatelet therapy: On ASA 3. Pain Management: Denies pain, not receiving any pain medications.  4. Mood: LCSW to follow for evaluations and support.              -antipsychotic agents:  N/A 5. Neuropsych: This patient is capable of making decisions on his own behalf. 6. Skin/Wound Care: Monitor incisions for healing.  7. Fluids/Electrolytes/Nutrition: Offer supplements between meals to promote wound healing.  8. AFib: Continue to monitor HR tid and with increase in activity. Continue Amiodarone bid 9. Persistent Leucocytosis: WBC up to 16,000 +--infectious work up negative s/p IV Cefepime.   Thoracentesis performed by IR on 2/11, positive WBCs, no growth on culture thus  far, repeat CBC in a.m.  2/15- WBC down to 13.2k- improved- still on Cefipime  -will wait to d/c since WBC is not normalized. Wait at least 1 more day (also had 5 days Cefipime earlier) 10 Acute renal failure with hypernatremia: Need to encourage fluid intake.   2.6- Cr down to 1.23 and BUN down to 23- recovering- Na 145- so improving- con't regimen  2/8: Na increased to 147.   2/10- Na back down and Cr down to 1.06- improving in spite of Cefipime and Vanc- will need to follow/monitor to make sure doesn't cause renal impairment on IV ABX.  Discussed with pharmacy, given that MRSA is negative will stop Vanco and continue cefepime 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 On ASA, Losartan, digoxin, coreg, Lipitor.   Filed Weights   01/15/20 0438 01/17/20 0500 01/18/20 0424  Weight: 56.2 kg 52.5 kg 54 kg    2/14 wt range 52.5-57kg  2/15- weight up slightly to 54kg, however in florid pulmonary  edema- asked heart failure to come by- gave pt a dose of Lasix 40 mg IV and got EKG- cannot access; Cards suggested more Lasix- no other treatment- will follow plan.  12. Thrombocytopenia: Has resolved.  13. Shortness of breath: Encourage IS. Continue  14. Constipation: Resolved. Can DC daily docusate, miralax, and dulcolax. Patient is refusing and moving bowels regularly.   2/6- having loose stools- due to colchicine likely- will d/c Colace and add Fibercon daily  2/7- improved per pt.   2/8: continues to improve.  2/11- looser stools- likely due to IV ABX- will increase Fibercon dose and if doesn't improve, will check CDIff- doesn't smell or sound like C Diff- however not diagnostic- will check   15. Hypernatremia  2/9- was 147 improved to 144 on 2/13, repeat in a.m. 16. Insomnia  Improved last night, patient quite pleased with his sleep at this time  2/12-increase trazodone to 50 mg QHS  17.  Disposition: Lives alone, but has supportive family, including daughter, nearby.      LOS: 10 days A FACE TO FACE EVALUATION WAS PERFORMED  Chantia Amalfitano 01/18/2020, 3:51 PM

## 2020-01-18 NOTE — Progress Notes (Signed)
Occupational Therapy Session Note  Patient Details  Name: David Perez MRN: [id number] Date of Birth: [birth year]  Today's Date: 01/18/2020 OT Individual Time: 0858-1000 & 1413-1430 OT Individual Time Calculation (min): 62 min & 17 min Missed 28 minutes of pm session due to fatigue    Short Term Goals: Week 2:  OT Short Term Goal 1 (Week 2): STG = LTG  Skilled Therapeutic Interventions/Progress Updates:    am session:  Patient seated in recliner, weak voice noted, states that he is concerned about SOB (O2 sat 96% on room air)  He reports that he had a great day yesterday but is feeling exhausted this morning.  He states that he would like to get washed up and do what he can in therapy.  Sit to stand and SPT from recliner to w/c with CGA.  He tolerated UB bathing, oral care and grooming at sink with increased time and CS/set up of supplies.  Oh shirt donned with CS.  He declined changing pants this am.  He declined standing or mobility activities.  He remained seated in the w/c at close of session, seat belt alarm set and call bell/tray table in reach.   Pm session:  Patient seated in w/c, finished with lunch meal but states that he is not up to any mobility this afternoon.  Completed repositioning in w/c, weight shift and reviewed light activities that he can complete to maintain skin integrity and increase circulation.  Offered transfer to recliner, standing, use of toilet but he declines stating that he is not up to it this afternoon.   He missed 28 minutes of pm session.  He remained sitting in w/c with seat belt alarm set and call bell/tray table in reach.    Therapy Documentation Precautions:  Precautions Precautions: Sternal, Fall Precaution Comments: Reviewed sternal precautions Restrictions Weight Bearing Restrictions: Yes(sternal precautions) General:   Vital Signs: Therapy Vitals Temp: 98.1 F (36.7 C) Pulse Rate: 62 Resp: 18 BP: (!) 147/59 Patient Position (if  appropriate): Sitting Oxygen Therapy SpO2: 95 % O2 Device: Room Air   Therapy/Group: Individual Therapy  [name] 01/18/2020, 7:41 AM

## 2020-01-18 NOTE — Progress Notes (Signed)
Physical Therapy Note  Patient Details  Name: David Perez MRN: [id number] Date of Birth: November 17, 1941 Today's Date: 01/18/2020    Patient sitting in the w/c eating lunch with his daughter in the room upon PT arrival. Patient continues to have concerns about his SOB and expressed concerns about his nutrition intake, stated that his lunch was very poor. Provided patient with a whole milk and vanilla Ensure, patient was very appreciative. Patient declined performing any therapeutic exercise or functional activity due to fatigue and SOB and desired to continue eating lunch. Patient missed 30 min of skilled PT due to fatigue. Will attempt to make up missed time as able.    Maleni Seyer L Zadin Lange PT, DPT  01/18/2020, 3:05 PM

## 2020-01-18 NOTE — Progress Notes (Signed)
Patient with high levels of anxiety about his SOB that started overnight. Decreased lung sound with occasional wheeze and dysphonia unchanged.  RR 20 with oxygen saturation high 90's to 100. Marland Kitchen SBP 100- 105 without tachycardia.  Ego support provided and Nebs ordered. He was able to use flutter valve without discomfort --concerned that breathing treatments have stopped. Will schedule qid for now and monitor.

## 2020-01-18 NOTE — Progress Notes (Addendum)
Patient ID: David Perez, male   DOB: 1941-09-24, 79 y.o.   MRN: [id number]     Advanced Heart Failure Rounding Note  PCP-Cardiologist: No primary care provider on file.   Subjective:    1/28: CABG (LIMA-LAD, SVG-LPDA, SVG-OM1), Maze + LA appendage clip, bioprosthetic AVR.   Echo: EF 30%, small posterior pericardial effusion, decreased RV systolic function.   S/P Left Thoracentesis 01/14/20 with 1.4 liters remover.   CXR today with worsening pulmonary edema. Moderate left pleural effusion.   Yesterday able to walk 300 feet and felt better.    Short of breath this morning. Complaining of fatigue. Given 40 mg IV lasix this morning.   Objective:   Weight Range: 54 kg Body mass index is 18.1 kg/m.   Vital Signs:   Temp:  [98.1 F (36.7 C)] 98.1 F (36.7 C) (02/15 0402) Pulse Rate:  [62-68] 62 (02/15 0402) Resp:  [18] 18 (02/15 0402) BP: (144-147)/(57-59) 147/59 (02/15 0402) SpO2:  [93 %-95 %] 95 % (02/15 0402) Weight:  [54 kg] 54 kg (02/15 0424) Last BM Date: 01/17/20  Weight change: Filed Weights   01/15/20 0438 01/17/20 0500 01/18/20 0424  Weight: 56.2 kg 52.5 kg 54 kg    Intake/Output:   Intake/Output Summary (Last 24 hours) at 01/18/2020 1425 Last data filed at 01/18/2020 0800 Gross per 24 hour  Intake 756.07 ml  Output --  Net 756.07 ml      Physical Exam  General:  Appears chronically ill. Thin.  No resp difficulty HEENT: normal Neck: supple. JVP 9-10 . Carotids 2+ bilat; no bruits. No lymphadenopathy or thryomegaly appreciated. Cor: PMI nondisplaced. Regular rate & rhythm. No rubs, gallops or murmurs. Lungs: Decreased on left 1/2 way up. Decreased RLL on room air Abdomen: soft, nontender, nondistended. No hepatosplenomegaly. No bruits or masses. Good bowel sounds. Extremities: no cyanosis, clubbing, rash, edema. R and LLE ted hose.  Neuro: alert & orientedx3, cranial nerves grossly intact. moves all 4 extremities w/o difficulty. Affect pleasant  EKG: NSR  65 bpm    Labs    CBC Recent Labs    01/16/20 0816 01/18/20 0730  WBC 14.8* 13.2*  NEUTROABS 12.6* 10.8*  HGB 10.7* 10.9*  HCT 33.3* 33.5*  MCV 103.4* 102.8*  PLT 289 757   Basic Metabolic Panel Recent Labs    01/16/20 0816 01/18/20 0730  NA 144 142  K 4.1 3.7  CL 119* 117*  CO2 18* 19*  GLUCOSE 141* 135*  BUN 17 18  CREATININE 1.11 1.13  CALCIUM 8.7* 8.6*   Liver Function Tests No results for input(s): AST, ALT, ALKPHOS, BILITOT, PROT, ALBUMIN in the last 72 hours. No results for input(s): LIPASE, AMYLASE in the last 72 hours. Cardiac Enzymes No results for input(s): CKTOTAL, CKMB, CKMBINDEX, TROPONINI in the last 72 hours.  BNP: BNP (last 3 results) No results for input(s): BNP in the last 8760 hours.  ProBNP (last 3 results) No results for input(s): PROBNP in the last 8760 hours.   D-Dimer No results for input(s): DDIMER in the last 72 hours. Hemoglobin A1C No results for input(s): HGBA1C in the last 72 hours. Fasting Lipid Panel No results for input(s): CHOL, HDL, LDLCALC, TRIG, CHOLHDL, LDLDIRECT in the last 72 hours. Thyroid Function Tests No results for input(s): TSH, T4TOTAL, T3FREE, THYROIDAB in the last 72 hours.  Invalid input(s): FREET3  Other results:   Imaging    DG Chest 2 View  Result Date: 01/18/2020 CLINICAL DATA:  Left-sided pleural effusion. EXAM: CHEST -  2 VIEW COMPARISON:  01/14/2020 FINDINGS: Grossly unchanged cardiac silhouette and mediastinal contours post median sternotomy, CABG and valve repair. Interval increase in small right and moderate-sized left-sided pleural effusion with associated worsening bibasilar heterogeneous/consolidative opacities. The lungs remain hyperexpanded. Pulmonary vasculature appears slightly less distinct than present examination. No pneumothorax. No acute osseous abnormalities. Accentuated thoracic kyphosis. IMPRESSION: Suspected worsening pulmonary edema superimposed on advanced emphysematous change  with small right and moderate-sized left pleural effusions and associated bibasilar opacities, likely atelectasis. Electronically Signed   By: Sandi Mariscal M.D.   On: 01/18/2020 07:26     Medications:     Scheduled Medications: . amiodarone  200 mg Oral BID  . apixaban  2.5 mg Oral Q12H  . aspirin  81 mg Oral Daily  . atorvastatin  80 mg Oral q1800  . carvedilol  3.125 mg Oral BID WC  . chlorhexidine  15 mL Mouth/Throat BID  . colchicine  0.6 mg Oral Daily  . digoxin  0.0625 mg Oral Daily  . feeding supplement (ENSURE ENLIVE)  237 mL Oral TID BM  . losartan  12.5 mg Oral Daily  . polycarbophil  1,250 mg Oral Daily  . traZODone  50 mg Oral QHS    Infusions: . sodium chloride 10 mL/hr at 01/18/20 0950  . ceFEPime (MAXIPIME) IV 2 g (01/18/20 0954)    PRN Medications: sodium chloride, acetaminophen, alum & mag hydroxide-simeth, bisacodyl, diphenhydrAMINE, guaiFENesin-dextromethorphan, levalbuterol, Melatonin, oxyCODONE, polyethylene glycol, prochlorperazine **OR** prochlorperazine **OR** prochlorperazine, sodium phosphate   Assessment/Plan   1. CAD: STEMI with very short LM and 99% ostial LAD. PCI thought to risk compromise of ostial large LCx, so decision made to defer PCI and transfer to [facility] for CABG as patient was CP-free at that point. Now s/p CABG with LIMA-LAD, SVG-OM2, SVG-LPDA.   - No chest pain.  - Continue ASA.  - Continue atorvastatin 80 mg daily 2. Cardiogenic shock: Pre-op echo with EF 35%.  Now s/p CABG-AVR.  Echo post-op with EF 30%, RV dysfunction. Milrinone discontinued.   - Volume status elevated. Give Lasix 40 mg IV x 2 times daily.  BMET in am.  - Continue Coreg 3.125 mg bid.  - Continue spironolactone 25 daily.  - Continue digoxin 0.0625 daily.  - Continue losartan 12.5 mg  3. Atrial fibrillation: S/p Maze, LA appendage clip.   - Rate controlled. In NSR 65 today.  - Continue continue amiodarone 200 mg bid.   - Continue eliquis 2.5 mg twice a day. :Lower  dose with age and weight.  4. Hyperlipidemia: LDL 187. Goal <70.  - Treat w/ atorvastatin 80 qhs.  - Repeat FLP + HFTs in 6 weeks. If not at goal will need addition of Zetia +/- [id number]  5. Thrombocytopenia: Post-op, trending up. Platelets stable.  6. Acute hypoxemic respiratory failure: Pulmonary edema.  - Improved w/ diuresis 7. AKI: Creatinine stable 1.13   8. Delirium: Resolved.  9. ID: WBCs 25 => 19 => 16.6 => 15=>16.3=>16=>13 , afebrile.  On cefepime.  Cultures NGTD.  10. Anemia: Post-op, got 1 unit PRBCs 1/31.  Hgb stable.  11. Dysphagia: Passed swallow study, can stop free water.   12. Elevated LFTs: ?Shock liver from hypotensive episode immediately post-op. LFTs now trending down.  RUQ US unremarkable.   13. Deconditioning.  Continue work with PT/OT 14. Hypernatremia: Resolved 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  CRP up but not ESR.   - Continue colchicine 2 wks total  then stop on 01/20/20   16. Left pleural effusion  01/14/20 S/P Left thoracentesis 1.4 liters  CXR today with recurrent pleural effusion.  CHeck BMET in am.    Length of Stay: [place], NP  01/18/2020, 2:25 PM  Advanced Heart Failure Team Pager [phone] (M-F; [place])  Please contact [facility] for night-coverage after hours (4p -7a ) and weekends on [email or web address]  Agree with the above note.   Patient has developed volume overload, mild JVD.   - Start Lasix 40 mg IV bid and follow weight and daily BMET.  Will need oral diuretic for home eventually.  - Continue other cardiac meds, check digoxin level in am.   Loralie Champagne 01/18/2020

## 2020-01-18 NOTE — Progress Notes (Signed)
Physical Therapy Session Note  Patient Details  Name: David Perez MRN: [id number] Date of Birth: 1941-10-27  Today's Date: 01/18/2020 PT Individual Time: 1110-1200 PT Individual Time Calculation (min): 50 min   Short Term Goals: Week 2:  PT Short Term Goal 1 (Week 2): STG=LTG due to ELOS.  Skilled Therapeutic Interventions/Progress Updates:     Patient in w/c in room upon PT arrival. Patient alert and agreeable to PT session. Patient denied pain during session, however, reported increased SOB and fatigue today. Discussed patient reporting significant SOB when going for a chest x-ray this morning with minimal standing and transfers, Dr. Dagoberto Ligas aware. He expressed significant concern about his decline in function as he did very well in therapy yesterday. Noted that patient was visibly SOB while talking sitting in the chair. VItals: BP 123/62, HR 60, SPO2 99% on RA.  Therapeutic Activity: Transfers: Patient performed sit to/from stand x2 with supervision with hands on thighs to maintain sternal precautions. Provided verbal cues for forward weight shift.  Gait Training:  Patient ambulated 8 feet without an AD with CGA for safety with visible SOB and modified RPE 8/10 after. Vitals after ambulation: BP 117/62, HR 63, SPO2 99%. He also ambulated ~15 feet into the bathroom after a long sitting rest break with CGA without an AD.  Ambulated with decreased gait speed, decreased step length and height, increased B hip and knee flexion in stance, forward trunk lean, and downward head gaze. Provided verbal cues for erect posture and paced breathing. Patient was very upset about his decline in function after walking 334 feet yesterday. Comforted patient and discussed change in medical status and effect of activity level.   Therapeutic Exercise: Patient performed the following exercises in sitting for energy conservation with verbal and tactile cues for proper technique. -heel/toe raises 2x20 -B LAQ  2x10 -Alternating marching 2x10  Patient in seated on the toilet in the bathroom with his daughter in the room and pull cord in reach at end of session. Patient missed 10 min of skilled therapy due to patient fatigue and toileting. His daughter and him requested to speak to someone from the medical team about patient's status. Dr. Dagoberto Ligas and Jeannene Patella, PA made aware.    Therapy Documentation Precautions:  Precautions Precautions: Sternal, Fall Precaution Comments: Reviewed sternal precautions Restrictions Weight Bearing Restrictions: Yes(sternal precautions)    Therapy/Group: Individual Therapy  Myosha Cuadras L Tatumn Corbridge PT, DPT  01/18/2020, 1:20 PM

## 2020-01-19 ENCOUNTER — Inpatient Hospital Stay (HOSPITAL_COMMUNITY): Payer: Medicare Other

## 2020-01-19 ENCOUNTER — Inpatient Hospital Stay (HOSPITAL_COMMUNITY): Payer: Medicare Other | Admitting: Occupational Therapy

## 2020-01-19 LAB — BASIC METABOLIC PANEL
Anion gap: 8 (ref 5–15)
BUN: 25 mg/dL — ABNORMAL HIGH (ref 8–23)
CO2: 19 mmol/L — ABNORMAL LOW (ref 22–32)
Calcium: 8.6 mg/dL — ABNORMAL LOW (ref 8.9–10.3)
Chloride: 116 mmol/L — ABNORMAL HIGH (ref 98–111)
Creatinine, Ser: 1.56 mg/dL — ABNORMAL HIGH (ref 0.61–1.24)
GFR calc Af Amer: 48 mL/min — ABNORMAL LOW (ref >60)
GFR calc non Af Amer: 41 mL/min — ABNORMAL LOW (ref >60)
Glucose, Bld: 116 mg/dL — ABNORMAL HIGH (ref 70–99)
Potassium: 3.4 mmol/L — ABNORMAL LOW (ref 3.5–5.1)
Sodium: 143 mmol/L (ref 135–145)

## 2020-01-19 LAB — CBC WITH DIFFERENTIAL/PLATELET
Abs Immature Granulocytes: 0.07 10*3/uL (ref 0.00–0.07)
Basophils Absolute: 0.1 10*3/uL (ref 0.0–0.1)
Basophils Relative: 1 %
Eosinophils Absolute: 0.2 10*3/uL (ref 0.0–0.5)
Eosinophils Relative: 2 %
HCT: 32.4 % — ABNORMAL LOW (ref 39.0–52.0)
Hemoglobin: 10.5 g/dL — ABNORMAL LOW (ref 13.0–17.0)
Immature Granulocytes: 1 %
Lymphocytes Relative: 8 %
Lymphs Abs: 1 10*3/uL (ref 0.7–4.0)
MCH: 32.9 pg (ref 26.0–34.0)
MCHC: 32.4 g/dL (ref 30.0–36.0)
MCV: 101.6 fL — ABNORMAL HIGH (ref 80.0–100.0)
Monocytes Absolute: 1.1 10*3/uL — ABNORMAL HIGH (ref 0.1–1.0)
Monocytes Relative: 8 %
Neutro Abs: 11 10*3/uL — ABNORMAL HIGH (ref 1.7–7.7)
Neutrophils Relative %: 80 %
Platelets: 285 10*3/uL (ref 150–400)
RBC: 3.19 MIL/uL — ABNORMAL LOW (ref 4.22–5.81)
RDW: 18.8 % — ABNORMAL HIGH (ref 11.5–15.5)
WBC: 13.4 10*3/uL — ABNORMAL HIGH (ref 4.0–10.5)
nRBC: 0 % (ref 0.0–0.2)

## 2020-01-19 LAB — CULTURE, RESPIRATORY W GRAM STAIN: Special Requests: NORMAL

## 2020-01-19 LAB — CULTURE, BODY FLUID W GRAM STAIN -BOTTLE: Culture: NO GROWTH

## 2020-01-19 LAB — DIGOXIN LEVEL: Digoxin Level: 0.5 ng/mL — ABNORMAL LOW (ref 0.8–2.0)

## 2020-01-19 MED ORDER — AMIODARONE HCL 200 MG PO TABS
200.0000 mg | ORAL_TABLET | Freq: Every day | ORAL | Status: DC
  Administered 2020-01-20 – 2020-01-25 (×6): 200 mg via ORAL
  Filled 2020-01-19 (×6): qty 1

## 2020-01-19 MED ORDER — POTASSIUM CHLORIDE CRYS ER 20 MEQ PO TBCR
40.0000 meq | EXTENDED_RELEASE_TABLET | Freq: Two times a day (BID) | ORAL | Status: AC
  Administered 2020-01-19 (×2): 40 meq via ORAL
  Filled 2020-01-19 (×2): qty 2

## 2020-01-19 NOTE — Progress Notes (Signed)
Occupational Therapy Session Note  Patient Details  Name: David Perez MRN: [id number] Date of Birth: 06-May-1941  Today's Date: 01/19/2020 OT Individual Time: 1350-1448 OT Individual Time Calculation (min): 58 min    Short Term Goals: Week 2:  OT Short Term Goal 1 (Week 2): STG = LTG  Skilled Therapeutic Interventions/Progress Updates:    Patient in bed, states that he is feeling tired, but would like to get OOB for therapy.  He denies pain at this time.  Supine to sitting with CS.  Sit to stand and SPT to w/c with CGA.  Utilized hair trimmer to shave with min a and increased time.  Completed UB bathing with CS/set up, completed oral care and hair care with CS.  OH shirt donned with CS.  Mod A for donning clean pants.  Clothing management in stance with CGA.  Patient pleasant and talkative t/o session but needed frequent rest breaks and declined ambulation or exercise due to fatigue today.  He declined last 17 minutes, remained seated in the w/c with seat belt alarm set and call bell / tray table in reach.    Therapy Documentation Precautions:  Precautions Precautions: Sternal, Fall Precaution Comments: Reviewed sternal precautions Restrictions Weight Bearing Restrictions: Yes General:   Vital Signs: Therapy Vitals Temp: 97.9 F (36.6 C) Temp Source: Oral Pulse Rate: (!) 56 BP: (!) 110/50 Patient Position (if appropriate): Lying Oxygen Therapy SpO2: 99 % O2 Device: Room Air  Therapy/Group: Individual Therapy  [name] 01/19/2020, 7:36 AM

## 2020-01-19 NOTE — Progress Notes (Signed)
[facility] PHYSICAL MEDICINE & REHABILITATION PROGRESS NOTE   Subjective/Complaints:  Pt reports feeling better today than yesterday.  However not back to Sunday feeling /goodness.   Frustrated that isn't sure how well he's doing and doesn't want to go home feeling poorly.     ROS: pt denies cardiac CP, endorses severe SOB, abd pain, HA, vision changes, mood changes, N/V/C ; still loose stools but better overall  Objective:   DG Chest 2 View  Result Date: 01/18/2020 CLINICAL DATA:  Left-sided pleural effusion. EXAM: CHEST - 2 VIEW COMPARISON:  01/14/2020 FINDINGS: Grossly unchanged cardiac silhouette and mediastinal contours post median sternotomy, CABG and valve repair. Interval increase in small right and moderate-sized left-sided pleural effusion with associated worsening bibasilar heterogeneous/consolidative opacities. The lungs remain hyperexpanded. Pulmonary vasculature appears slightly less distinct than present examination. No pneumothorax. No acute osseous abnormalities. Accentuated thoracic kyphosis. IMPRESSION: Suspected worsening pulmonary edema superimposed on advanced emphysematous change with small right and moderate-sized left pleural effusions and associated bibasilar opacities, likely atelectasis. Electronically Signed   By: Sandi Mariscal M.D.   On: 01/18/2020 07:26   Recent Labs    01/18/20 0730 01/19/20 0534  WBC 13.2* 13.4*  HGB 10.9* 10.5*  HCT 33.5* 32.4*  PLT 298 285   Recent Labs    01/18/20 0730 01/19/20 0534  NA 142 143  K 3.7 3.4*  CL 117* 116*  CO2 19* 19*  GLUCOSE 135* 116*  BUN 18 25*  CREATININE 1.13 1.56*  CALCIUM 8.6* 8.6*    Intake/Output Summary (Last 24 hours) at 01/19/2020 [id number] Last data filed at 01/18/2020 2251 Gross per 24 hour  Intake 360 ml  Output 875 ml  Net -515 ml     Physical Exam:  Vital Signs Blood pressure 116/61, pulse 67, temperature 97.9 F (36.6 C), temperature source Oral, resp. rate 18, height 5\' 8"  (1.727 m),  weight 54 kg, SpO2 99 %. LABS and VITALS AND NURSING/STAFF NOTES REVIEWED Physical Exam  General: Alert and oriented; sitting up on EOB; less accessory muscle use- only trace use; and appears more comfortable, NAD HEENT: conjugate gaze; trachea midline Neck: Supple with no JVD Heart: Reg rate and rhythm. Mild murmur heard; no R/G Chest: very decreased breath sounds at bases B/L; no coarse or rhonchi/wheezing heard today  Abdomen: Soft, non-tender, non-distended, (+) BS Extremities: No clubbing, cyanosis, or edema. Pulses are 2+ Skin: Surgical incision on chest looks good, drain sites healing well Neuro: Pt is cognitively appropriate with normal insight, memory, and awareness.  Sensory exam is normal. Reflexes are 2+ in all 4's. Fine motor coordination is intact. No tremors. Motor function is grossly 5/5.  Musculoskeletal:  Parascapular wasting around the infraspinatus supraspinatus fossa decreased shoulder range of motion Psych: frustrated but brighter than yesterday   Assessment/Plan: 1. 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  Physiatrist is providing close team supervision and 24 hour management of active medical problems listed below.  Physiatrist and rehab team continue to assess barriers to discharge/monitor patient progress toward functional and medical goals  Care Tool:  Bathing    Body parts bathed by patient: Right arm, Left arm, Chest, Abdomen, Front perineal area, Buttocks, Right upper leg, Left upper leg, Right lower leg, Left lower leg, Face   Body parts bathed by helper: Chest, Abdomen, Right lower leg, Left lower leg     Bathing assist Assist Level: Supervision/Verbal cueing  Upper Body Dressing/Undressing Upper body dressing Upper body dressing/undressing activity did not occur (including orthotics): N/A What is the patient wearing?: Pull  over shirt    Upper body assist Assist Level: Supervision/Verbal cueing    Lower Body Dressing/Undressing Lower body dressing      What is the patient wearing?: Pants     Lower body assist Assist for lower body dressing: Supervision/Verbal cueing     Toileting Toileting    Toileting assist Assist for toileting: Contact Guard/Touching assist     Transfers Chair/bed transfer  Transfers assist     Chair/bed transfer assist level: Supervision/Verbal cueing     Locomotion Ambulation   Ambulation assist      Assist level: Contact Guard/Touching assist Assistive device: No Device Max distance: 8'(limited by SOB and fatigue)   Walk 10 feet activity   Assist     Assist level: Supervision/Verbal cueing Assistive device: No Device   Walk 50 feet activity   Assist    Assist level: Supervision/Verbal cueing Assistive device: Walker-rolling    Walk 150 feet activity   Assist Walk 150 feet activity did not occur: Safety/medical concerns  Assist level: Supervision/Verbal cueing Assistive device: Walker-rolling    Walk 10 feet on uneven surface  activity   Assist     Assist level: Minimal Assistance - Patient > 75% Assistive device: Aeronautical engineer Will patient use wheelchair at discharge?: No             Wheelchair 50 feet with 2 turns activity    Assist            Wheelchair 150 feet activity     Assist          Blood pressure 116/61, pulse 67, temperature 97.9 F (36.6 C), temperature source Oral, resp. rate 18, height 5\' 8"  (1.727 m), weight 54 kg, SpO2 99 %.  Medical Problem List and Plan: 1.  Impaired mobility and ADLs secondary to cardiac debility s/p CABGx3.             -patient may shower             -ELOS/Goals: modI in PT, OT, I in SLP             -2 weeks colchicine for pericardial irritation  -Continue CIR PT and OT  -con't to improve SOB issues 2.   Antithrombotics: -DVT/anticoagulation: Starting Eliquis today (2/5)              -antiplatelet therapy: On ASA 3. Pain Management: Denies pain, not receiving any pain medications.  4. Mood: LCSW to follow for evaluations and support.              -antipsychotic agents:  N/A 5. Neuropsych: This patient is capable of making decisions on his own behalf. 6. Skin/Wound Care: Monitor incisions for healing.  7. Fluids/Electrolytes/Nutrition: Offer supplements between meals to promote wound healing.  8. AFib: Continue to monitor HR tid and with increase in activity. Continue Amiodarone bid 9. Persistent Leucocytosis: WBC up to 16,000 +--infectious work up negative s/p IV Cefepime.   Thoracentesis performed by IR on 2/11, positive WBCs, no growth on culture thus far, repeat CBC in a.m.  2/15- WBC down to 13.2k- improved- still on Cefipime  -will wait to d/c since WBC is not normalized. Wait at least 1 more day (also had 5 days Cefipime earlier)  2/16- will stop Cefipime today since feeling better; stil afebrile, but never had  temp of any kind.  10 Acute renal failure with hypernatremia: Need to encourage fluid intake.   2.6- Cr down to 1.23 and BUN down to 23- recovering- Na 145- so improving- con't regimen  2/8: Na increased to 147.   2/10- Na back down and Cr down to 1.06- improving in spite of Cefipime and Vanc- will need to follow/monitor to make sure doesn't cause renal impairment on IV ABX.  Discussed with pharmacy, given that MRSA is negative will stop Vanco and continue cefepime  2/16- Cr up to 1.56  From 1.13 from aggressive diuresis- will monitor- might need to be a little dry right now due to lung issues. Replete KCL 40 mgEq x2 and recheck labs. 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 On ASA, Losartan, digoxin, coreg, Lipitor.   Filed Weights   01/15/20 0438 01/17/20 0500 01/18/20 0424  Weight: 56.2 kg 52.5 kg 54 kg    2/14 wt range  52.5-57kg  2/15- weight up slightly to 54kg, however in florid pulmonary edema- asked heart failure to come by- gave pt a dose of Lasix 40 mg IV and got EKG- cannot access; Cards suggested more Lasix- - will follow plan  2/16-  Pt feeling better; not back to baseline- although SOB better, Cr up to 1.56 and losing K+- will need to replete and monitor Cr closely.  12. Thrombocytopenia: Has resolved.  13. Shortness of breath: Encourage IS. Continue  14. Constipation: Resolved. Can DC daily docusate, miralax, and dulcolax. Patient is refusing and moving bowels regularly.   2/6- having loose stools- due to colchicine likely- will d/c Colace and add Fibercon daily  2/7- improved per pt.   2/8: continues to improve.  2/11- looser stools- likely due to IV ABX- will increase Fibercon dose and if doesn't improve, will check CDIff- doesn't smell or sound like C Diff- however not diagnostic- will check    2/16- never had loose stool- so not sent 15. Hypernatremia  2/9- was 147 improved to 144 on 2/13, repeat in a.m. 16. Insomnia  Improved last night, patient quite pleased with his sleep at this time  2/12-increase trazodone to 50 mg QHS   2/16- sleeping well 17.  Disposition: Lives alone, but has supportive family, including daughter, nearby.      LOS: 11 days A FACE TO FACE EVALUATION WAS PERFORMED  Emmry Hinsch 01/19/2020, 9:07 AM

## 2020-01-19 NOTE — Progress Notes (Signed)
Patient ID: David Perez, male   DOB: [birth year], 79 y.o.   MRN: [id number]     Advanced Heart Failure Rounding Note  PCP-Cardiologist: No primary care provider on file.   Subjective:    1/28: CABG (LIMA-LAD, SVG-LPDA, SVG-OM1), Maze + LA appendage clip, bioprosthetic AVR.   Echo: EF 30%, small posterior pericardial effusion, decreased RV systolic function.   S/P Left Thoracentesis 01/14/20 with 1.4 liters removed.   Short of breath yesterday and volume overloaded, given IV Lasix, last dose this morning.  Says breathing is better today. No weight done.   Objective:   Weight Range: 54 kg Body mass index is 18.1 kg/m.   Vital Signs:   Temp:  [97.6 F (36.4 C)-98.1 F (36.7 C)] 97.9 F (36.6 C) (02/16 0357) Pulse Rate:  [55-67] 67 (02/16 0759) Resp:  [18] 18 (02/15 1439) BP: (92-126)/(47-61) 116/61 (02/16 0759) SpO2:  [83 %-100 %] 98 % (02/16 1126) Last BM Date: 01/18/20  Weight change: Filed Weights   01/15/20 0438 01/17/20 0500 01/18/20 0424  Weight: 56.2 kg 52.5 kg 54 kg    Intake/Output:   Intake/Output Summary (Last 24 hours) at 01/19/2020 1333 Last data filed at 01/19/2020 0847 Gross per 24 hour  Intake 360 ml  Output 875 ml  Net -515 ml      Physical Exam  General: NAD, thin Neck: No JVD, no thyromegaly or thyroid nodule.  Lungs: Decreased BS on left.  CV: Nondisplaced PMI.  Heart regular S1/S2, no S3/S4, no murmur.  No peripheral edema.    Abdomen: Soft, nontender, no hepatosplenomegaly, no distention.  Skin: Intact without lesions or rashes.  Neurologic: Alert and oriented x 3.  Psych: Normal affect. Extremities: No clubbing or cyanosis.  HEENT: Normal.    Labs    CBC Recent Labs    01/18/20 0730 01/19/20 0534  WBC 13.2* 13.4*  NEUTROABS 10.8* 11.0*  HGB 10.9* 10.5*  HCT 33.5* 32.4*  MCV 102.8* 101.6*  PLT 298 395   Basic Metabolic Panel Recent Labs    01/18/20 0730 01/19/20 0534  NA 142 143  K 3.7 3.4*  CL 117* 116*  CO2 19* 19*    GLUCOSE 135* 116*  BUN 18 25*  CREATININE 1.13 1.56*  CALCIUM 8.6* 8.6*   Liver Function Tests No results for input(s): AST, ALT, ALKPHOS, BILITOT, PROT, ALBUMIN in the last 72 hours. No results for input(s): LIPASE, AMYLASE in the last 72 hours. Cardiac Enzymes No results for input(s): CKTOTAL, CKMB, CKMBINDEX, TROPONINI in the last 72 hours.  BNP: BNP (last 3 results) No results for input(s): BNP in the last 8760 hours.  ProBNP (last 3 results) No results for input(s): PROBNP in the last 8760 hours.   D-Dimer No results for input(s): DDIMER in the last 72 hours. Hemoglobin A1C No results for input(s): HGBA1C in the last 72 hours. Fasting Lipid Panel No results for input(s): CHOL, HDL, LDLCALC, TRIG, CHOLHDL, LDLDIRECT in the last 72 hours. Thyroid Function Tests No results for input(s): TSH, T4TOTAL, T3FREE, THYROIDAB in the last 72 hours.  Invalid input(s): FREET3  Other results:   Imaging    No results found.   Medications:     Scheduled Medications: . amiodarone  200 mg Oral BID  . apixaban  2.5 mg Oral Q12H  . aspirin  81 mg Oral Daily  . atorvastatin  80 mg Oral q1800  . carvedilol  3.125 mg Oral BID WC  . chlorhexidine  15 mL Mouth/Throat BID  . colchicine  0.6 mg Oral Daily  . digoxin  0.0625 mg Oral Daily  . feeding supplement (ENSURE ENLIVE)  237 mL Oral TID BM  . losartan  12.5 mg Oral Daily  . polycarbophil  1,250 mg Oral Daily  . potassium chloride  40 mEq Oral BID  . traZODone  50 mg Oral QHS    Infusions:   PRN Medications: acetaminophen, alum & mag hydroxide-simeth, bisacodyl, diphenhydrAMINE, guaiFENesin-dextromethorphan, levalbuterol, Melatonin, oxyCODONE, polyethylene glycol, prochlorperazine **OR** prochlorperazine **OR** prochlorperazine, sodium phosphate   Assessment/Plan   1. CAD: STEMI with very short LM and 99% ostial LAD. PCI thought to risk compromise of ostial large LCx, so decision made to defer PCI and transfer to  [facility] for CABG as patient was CP-free at that point. Now s/p CABG with LIMA-LAD, SVG-OM2, SVG-LPDA.  No chest pain.  - Continue ASA.  - Continue atorvastatin 80 mg daily 2. Cardiogenic shock: Pre-op echo with EF 35%.  Now s/p CABG-AVR.  Echo post-op with EF 30%, RV dysfunction. Milrinone discontinued.  Volume overload yesterday, treated with IV Lasix and creatinine up to 1.5 today. He does not look volume overloaded on exam today.  - He got a dose of IV Lasix this morning, stop for now with elevated creatinine.  Will start po Lasix tomorrow if creatinine stable.   - Continue Coreg 3.125 mg bid.  - Continue spironolactone 25 daily.  - Continue digoxin 0.0625 daily, level ok today.  - Continue losartan 12.5 mg  3. Atrial fibrillation: S/p Maze, LA appendage clip.  Regular on exam.  - Can decrease amiodarone to once daily.    - Continue eliquis 2.5 mg twice a day. Lower dose with age and weight.  4. Hyperlipidemia: LDL 187. Goal <70.  - Treat w/ atorvastatin 80 qhs.  - Repeat FLP + HFTs in 6 weeks. If not at goal will need addition of Zetia +/- [id number]  5. Thrombocytopenia: Post-op, trending up. Platelets stable.  6. Acute hypoxemic respiratory failure: Pulmonary edema.  - Improved w/ diuresis 7. AKI: Creatinine higher at 1.5 with diuresis, see above.   8. Delirium: Resolved.  9. ID: He completed course of cefepime in CIR.  10. Anemia: Post-op, got 1 unit PRBCs 1/31. Hgb stable.  11. Dysphagia: Passed swallow study, can stop free water.   12. Elevated LFTs: ?Shock liver from hypotensive episode immediately post-op. LFTs trended down.  RUQ US unremarkable.   13. Deconditioning.  Continue work with PT/OT 14. Hypernatremia: Resolved 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  CRP up but not ESR.   - Continue colchicine 2 wks total then stop on 01/20/20   16. Left pleural effusion: 01/14/20 S/P Left thoracentesis 1.4 liters  -  Moderate left effusion on 2/15 CXR, has been diuresed since then.  Continue to follow.   CHeck BMET in am.    Length of Stay: 11  Loralie Champagne, MD  01/19/2020, 1:33 PM  Advanced Heart Failure Team Pager [phone] (M-F; 7a - 4p)  Please contact [facility] for night-coverage after hours (4p -7a ) and weekends on [email or web address]

## 2020-01-19 NOTE — Patient Care Conference (Signed)
Inpatient RehabilitationTeam Conference and Plan of Care Update Date: 01/19/2020   Time: 11:35 AM   Patient Name: David Perez      Medical Record Number: [id number]  Date of Birth: 1941/01/07 Sex: Male         Room/Bed: 4W03C/4W03C-01 Payor Info: Payor: MEDICARE / Plan: MEDICARE PART A AND B / Product Type: *No Product type* /    Admit Date/Time:  01/08/2020  6:08 PM  Primary Diagnosis:  Debility  Patient Active Problem List   Diagnosis Date Noted  . Acute on chronic systolic CHF (congestive heart failure) ([place])   . Debility 01/08/2020  . STEMI involving left main coronary artery ([place]) 12/31/2019  . STEMI (ST elevation myocardial infarction) ([place]) 12/31/2019  . 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 Courtney Heys Social Worker Present: Lennart Pall, LCSW(Auria Barbra Sarks, [place]) Nurse Present: Isla Pence, RN Case Manager: Karene Fry, RN PT Present: Apolinar Junes, PT OT Present: Elisabeth Most, OT PPS Coordinator present : Gunnar Fusi, SLP     Current Status/Progress Goal Weekly Team Focus  Bowel/Bladder   Pt is continent of B&B, LBM 2/15  Pt will remain continent of b/b  assess toileting needs prn/q shift   Swallow/Nutrition/ Hydration             ADL's   UB adl CS/set up, LB adl min A, functional transfers CGA/CS - missed therapy on 2/15 due to fatigue/not feeling well  CS  family education, ongoing adl/transfer training, energy conservation, sternal precautions, basic HM   Mobility   Supervision bed mobility, transfers, and gait up to 334 feet, however, decline on 2/15 to 8 feet due to fatigue, SOB, CGA 8 stairs L rail.  Supervision to CGA, wanting to upgrade to mod I if able to tolerate activity.  Activity tolerance, strengthening, functional mobility, gait and stair training, d/c planning, patient/caregiver education.   Communication              Safety/Cognition/ Behavioral Observations            Pain   Pt complains of incision pain in chest, described as discomfort/soreness  Pt pain <4  assess pain q shift/prn   Skin   pt has midline incision (open to air, betadine BID), L leg incision (open to air), and small skin tears on R wrist, L ankle (covered with foam)  Pt will remain free from infection and further skin breakdown.  assess skin q shift/prn. dressing changes as ordered    Rehab Goals Patient on target to meet rehab goals: Yes *See Care Plan and progress notes for long and short-term goals.     Barriers to Discharge  Current Status/Progress Possible Resolutions Date Resolved   Nursing                  PT  Lack of/limited family support;Home environment access/layout;Medical stability                 OT                  SLP                SW                Discharge Planning/Teaching Needs:  Pt to d/c to his home with daughters providing up to 24/7 supervision.  Teaching needs TBD   Team Discussion: CABG, L thoracentesis  2/11, 1.4 L pulled off, WBC better, DC cefipine, breathing worse yesterday, K+ repleated, ? Fluid overload.  RN cont B/B, SOB.  OT B/D close S, LB dressing min A, transfers CGA.  PT amb 334' Sunday, did 8 steps X 2 with 1 rail, yesterday amb 8' with SOB and fatigue, felt bad, today amb 25'30', goals mod I.  Has Daughters.   Revisions to Treatment Plan: N/A     Medical Summary Current Status: c/o SOB; getting xopenex; 2nd dose of KCl given; not sweating; continent B/B; Weekly Focus/Goal: OT- sponge bath- was Supervision-CGA- dressing min assist; transfers still with CGA- refused/declined to do things due to fatigue  Barriers to Discharge: Decreased family/caregiver support;Home enviroment access/layout;Medical stability;Weight;Weight bearing restrictions  Barriers to Discharge Comments: PT- excellent day Sunday 300+ ft RW; 8 steps 1 rail 2x; walked 8 ft 8/10 fatigue; today walked 25 then 30  ft- little better Possible Resolutions to Barriers: goals mod I- would like to be home alone;   Continued Need for Acute Rehabilitation Level of Care: The patient requires daily medical management by a physician with specialized training in physical medicine and rehabilitation for the following reasons: Direction of a multidisciplinary physical rehabilitation program to maximize functional independence : Yes Medical management of patient stability for increased activity during participation in an intensive rehabilitation regime.: Yes Analysis of laboratory values and/or radiology reports with any subsequent need for medication adjustment and/or medical intervention. : Yes   I attest that I was present, lead the team conference, and concur with the assessment and plan of the team.   Retta Diones 01/20/2020, 11:54 AM   Team conference was held via web/ teleconference due to [place] - 19

## 2020-01-19 NOTE — Progress Notes (Addendum)
Physical Therapy Session Note  Patient Details  Name: David Perez MRN: [id number] Date of Birth: [birth year]  Today's Date: 01/19/2020 PT Individual Time: 0900-1010 PT Individual Time Calculation (min): 70 min   Short Term Goals: Week 2:  PT Short Term Goal 1 (Week 2): STG=LTG due to ELOS.  Skilled Therapeutic Interventions/Progress Updates:     Patient in bed upon PT arrival. Patient alert and agreeable to PT session. Patient denied pain during session, however, did report R shoulder/deltoid soreness last night. Vitals in sitting: BP 124/61, HR 72, SPO2 100% on RA.  Therapeutic Activity: Bed Mobility: Patient performed supine to sit with supervision in a flat bed without use of bed rails and no UE support to maintain sternal precautions. Provided verbal cues for breathing throughout to prevent SOB. Transfers: Patient performed sit to/from stand x4 and toilet transfer x2 with supervision using hands on thighs to maintain sternal precautions using a RW due to increased fatigue today. Provided verbal cues for scooting forward and forward weight shift to stand. He was continent of bowl and bladder during session and required supervision for safety for peri-care and LB dressing.   Gait Training:  Patient ambulated 12 feet to/from the bathroom x2 and 25 feet and 30 feet in the hallway using RW with close supervision and w/c follow due to decreased activity tolerance. Ambulated with decreased gait speed, decreased step length and height, increased B hip and knee flexion in stance, forward trunk lean, and downward head gaze. Provided verbal cues for erect posture, paced breathing and increased step height for safety. Patient with mild SOB ~10-20 sec and RPE 8/10 after each trial in the hallway.   Therapeutic Exercise: Patient performed the following exercises with verbal and tactile cues for proper technique. -heel/toe raises x20 -B LAQ x10 -scapular retraction with 3 sec hold x10 -seated gluteal  stretch 2x30 sec -diaphragmatic breathing with flutter valve x4 focused on taking breaths in between for improved breath support Patient required rest breaks between each exercise due to mild SOB and fatigue.  Patient in w/c in room at end of session with breaks locked and all needs within reach.   Patient continues to required frequent rest breaks throughout all activity due to increased SOB and fatigue, however, noted improved activity tolerance from yesterday.    Therapy Documentation Precautions:  Precautions Precautions: Sternal, Fall Precaution Comments: Reviewed sternal precautions Restrictions Weight Bearing Restrictions: Yes    Therapy/Group: Individual Therapy  Patricia Perales L Railey Glad PT, DPT  01/19/2020, 4:23 PM

## 2020-01-19 NOTE — Progress Notes (Signed)
Occupational Therapy Session Note  Patient Details  Name: David Perez MRN: [id number] Date of Birth: May 30, 1941  Today's Date: 01/19/2020 OT Individual Time: 1105-1130 OT Individual Time Calculation (min): 25 min  and Today's Date: 01/19/2020 OT Missed Time: 35 Minutes Missed Time Reason: Patient fatigue   Short Term Goals: Week 2:  OT Short Term Goal 1 (Week 2): STG = LTG  Skilled Therapeutic Interventions/Progress Updates:    1:1 Pt in w/c reporting fatigue from earlier session and poor nights rest (reporting only one hr of sleep). Pt's daughter present. Pt offered different therapeutic activities/ ADL tasks pt politely declined and reported maybe he would feel better this pm with next therapist. Pt requesting to rest sitting up in the chair. Did provide education on activities to promote movement and endurance training as well as continuing to perform breathing exercises with flutter valve. Pt declined toileting needs at this time. RT came and administered treatment. PT left sitting up in w/c.   Therapy Documentation Precautions:  Precautions Precautions: Sternal, Fall Precaution Comments: Reviewed sternal precautions Restrictions Weight Bearing Restrictions: Yes General: General OT Amount of Missed Time: 35 Minutes Pain:  no c/o pain - just SOB and requested breathing treatment- RT called.   Therapy/Group: Individual Therapy  [name] [facility] 01/19/2020, 11:34 AM

## 2020-01-20 ENCOUNTER — Inpatient Hospital Stay (HOSPITAL_COMMUNITY): Payer: Medicare Other

## 2020-01-20 ENCOUNTER — Inpatient Hospital Stay (HOSPITAL_COMMUNITY): Payer: Medicare Other | Admitting: Physical Therapy

## 2020-01-20 LAB — CBC WITH DIFFERENTIAL/PLATELET
Abs Immature Granulocytes: 0.1 10*3/uL — ABNORMAL HIGH (ref 0.00–0.07)
Basophils Absolute: 0.1 10*3/uL (ref 0.0–0.1)
Basophils Relative: 1 %
Eosinophils Absolute: 0.2 10*3/uL (ref 0.0–0.5)
Eosinophils Relative: 1 %
HCT: 33.2 % — ABNORMAL LOW (ref 39.0–52.0)
Hemoglobin: 10.6 g/dL — ABNORMAL LOW (ref 13.0–17.0)
Immature Granulocytes: 1 %
Lymphocytes Relative: 8 %
Lymphs Abs: 1.1 10*3/uL (ref 0.7–4.0)
MCH: 33.2 pg (ref 26.0–34.0)
MCHC: 31.9 g/dL (ref 30.0–36.0)
MCV: 104.1 fL — ABNORMAL HIGH (ref 80.0–100.0)
Monocytes Absolute: 1.4 10*3/uL — ABNORMAL HIGH (ref 0.1–1.0)
Monocytes Relative: 9 %
Neutro Abs: 11.8 10*3/uL — ABNORMAL HIGH (ref 1.7–7.7)
Neutrophils Relative %: 80 %
Platelets: 280 10*3/uL (ref 150–400)
RBC: 3.19 MIL/uL — ABNORMAL LOW (ref 4.22–5.81)
RDW: 19.2 % — ABNORMAL HIGH (ref 11.5–15.5)
WBC: 14.6 10*3/uL — ABNORMAL HIGH (ref 4.0–10.5)
nRBC: 0 % (ref 0.0–0.2)

## 2020-01-20 LAB — BASIC METABOLIC PANEL
Anion gap: 8 (ref 5–15)
BUN: 29 mg/dL — ABNORMAL HIGH (ref 8–23)
CO2: 18 mmol/L — ABNORMAL LOW (ref 22–32)
Calcium: 8.7 mg/dL — ABNORMAL LOW (ref 8.9–10.3)
Chloride: 117 mmol/L — ABNORMAL HIGH (ref 98–111)
Creatinine, Ser: 1.48 mg/dL — ABNORMAL HIGH (ref 0.61–1.24)
GFR calc Af Amer: 51 mL/min — ABNORMAL LOW (ref >60)
GFR calc non Af Amer: 44 mL/min — ABNORMAL LOW (ref >60)
Glucose, Bld: 112 mg/dL — ABNORMAL HIGH (ref 70–99)
Potassium: 4.2 mmol/L (ref 3.5–5.1)
Sodium: 143 mmol/L (ref 135–145)

## 2020-01-20 MED ORDER — FUROSEMIDE 20 MG PO TABS
20.0000 mg | ORAL_TABLET | Freq: Every day | ORAL | Status: DC
  Administered 2020-01-20 – 2020-01-26 (×7): 20 mg via ORAL
  Filled 2020-01-20 (×7): qty 1

## 2020-01-20 MED ORDER — LEVALBUTEROL HCL 0.63 MG/3ML IN NEBU
0.6300 mg | INHALATION_SOLUTION | Freq: Three times a day (TID) | RESPIRATORY_TRACT | Status: DC
  Filled 2020-01-20 (×3): qty 3

## 2020-01-20 MED ORDER — LEVALBUTEROL HCL 0.63 MG/3ML IN NEBU
0.6300 mg | INHALATION_SOLUTION | Freq: Three times a day (TID) | RESPIRATORY_TRACT | Status: DC
  Administered 2020-01-20: 0.63 mg via RESPIRATORY_TRACT
  Filled 2020-01-20 (×3): qty 3

## 2020-01-20 MED ORDER — POTASSIUM CHLORIDE CRYS ER 20 MEQ PO TBCR
20.0000 meq | EXTENDED_RELEASE_TABLET | Freq: Every day | ORAL | Status: DC
  Administered 2020-01-20 – 2020-01-28 (×9): 20 meq via ORAL
  Filled 2020-01-20 (×9): qty 1

## 2020-01-20 MED ORDER — SPIRONOLACTONE 12.5 MG HALF TABLET
12.5000 mg | ORAL_TABLET | Freq: Every day | ORAL | Status: DC
  Administered 2020-01-20 – 2020-01-27 (×8): 12.5 mg via ORAL
  Filled 2020-01-20 (×8): qty 1

## 2020-01-20 MED ORDER — POTASSIUM CHLORIDE CRYS ER 20 MEQ PO TBCR: 20.0000 meq | EXTENDED_RELEASE_TABLET | Freq: Every day | ORAL | Status: DC

## 2020-01-20 NOTE — Progress Notes (Signed)
Physical Therapy Session Note  Patient Details  Name: David Perez MRN: [id number] Date of Birth: 1941/06/13  Today's Date: 01/20/2020 PT Individual Time: 1600-1700 PT Individual Time Calculation (min): 60 min   Short Term Goals: Week 2:  PT Short Term Goal 1 (Week 2): STG=LTG due to ELOS.  Skilled Therapeutic Interventions/Progress Updates:   Pt received supine in bed and agreeable to PT. Supine>sit transfer with supervision assist and cues for safety. Stand pivot transfer to [facility] with supervision assist and RW. Pt reports severe SOB with transfer. PT transported pt to rehab gym in [facility]. Vitals assessed in sitting 109/66 SpO2 99%, HR 56. Sit<>stand with supervision assist from PT. Vitals assesed in standing. 99/63, HR 51, SpO2 with inconsistent reading as good as 92%. Re assessed Vital signs at 2 minutes. But pt unable to remain standing due to signifcant SOB. Once in sitting BP reading 125/68, HR 115. In standing and once pt sitting in WC, in consistent SpO2 reading 56-71%. After 1 minute resting in WC SpO2 reading 99%. PT re-assesed SpO2 in standing while in rehab gym with inconsistent reading from 74% to 87%, HR 33bpm-112bpm. Pt able to tolerate 2 min standing at most before requesting to sit from SOB and fatigue. Following  Each bout of vital assessment in standing, pt's SpO2 reading 96-99% in sitting at rest.  Pt returned to room and re-assed SpO2 with different machine 99% in sitting. 87% at 30seconds and 79% at 1 minute.  Stand pivot transfer to  Bed and performed sit>supine with supervision assist. SpO2 assessed after 1 minute rest in bed 100%.  Will continue to monitor and notify MD.      Therapy Documentation Precautions:  Precautions Precautions: Sternal, Fall Precaution Comments: Reviewed sternal precautions Restrictions Weight Bearing Restrictions: Yes Vital Signs: Therapy Vitals Pulse Rate: (!) 57 Resp: 18 BP: 99/67 Patient Position (if appropriate): Sitting Oxygen  Therapy SpO2: 98 % O2 Device: Room Air Pain: denies   Therapy/Group: Individual Therapy  Lorie Phenix 01/20/2020, 5:01 PM

## 2020-01-20 NOTE — Plan of Care (Signed)
  Problem: RH Balance Goal: LTG Patient will maintain dynamic standing balance (PT) Description: LTG:  Patient will maintain dynamic standing balance with assistance during mobility activities (PT) Flowsheets (Taken 01/20/2020 1514) LTG: Pt will maintain dynamic standing balance during mobility activities with:: (upgraded goal) Independent with assistive device  Note: Upgraded goal due to patient's progress and limited family support.    Problem: Sit to Stand Goal: LTG:  Patient will perform sit to stand with assistance level (PT) Description: LTG:  Patient will perform sit to stand with assistance level (PT) Flowsheets (Taken 01/20/2020 1514) LTG: PT will perform sit to stand in preparation for functional mobility with assistance level: (upgraded goal) Independent with assistive device Note: Upgraded goal due to patient's progress and limited family support.    Problem: RH Bed Mobility Goal: LTG Patient will perform bed mobility with assist (PT) Description: LTG: Patient will perform bed mobility with assistance, with/without cues (PT). Flowsheets (Taken 01/20/2020 1514) LTG: Pt will perform bed mobility with assistance level of: (upgraded goal) Independent Note: Upgraded goal due to patient's progress and limited family support.    Problem: RH Bed to Chair Transfers Goal: LTG Patient will perform bed/chair transfers w/assist (PT) Description: LTG: Patient will perform bed to chair transfers with assistance (PT). Flowsheets (Taken 01/20/2020 1514) LTG: Pt will perform Bed to Chair Transfers with assistance level: (upgraded goal) Independent with assistive device  Note: Upgraded goal due to patient's progress and limited family support.    Problem: RH Furniture Transfers Goal: LTG Patient will perform furniture transfers w/assist (OT/PT) Description: LTG: Patient will perform furniture transfers  with assistance (OT/PT). Flowsheets (Taken 01/20/2020 1514) LTG: Pt will perform furniture  transfers with assist:: (upgraded goal) Independent with assistive device  Note: Upgraded goal due to patient's progress and limited family support.    Problem: RH Ambulation Goal: LTG Patient will ambulate in home environment (PT) Description: LTG: Patient will ambulate in home environment, # of feet with assistance (PT). Flowsheets (Taken 01/20/2020 1514) LTG: Pt will ambulate in home environ  assist needed:: (upgraded goal) Independent with assistive device Note: Upgraded goal due to patient's progress and limited family support.

## 2020-01-20 NOTE — Progress Notes (Addendum)
[name] PHYSICAL MEDICINE & REHABILITATION PROGRESS NOTE   Subjective/Complaints:  Pt reports slept OK until 1:30 am when woke up due to chest discomfort- not pain- L sternal border ~ 2nd-3rd rib- had since surgery- just annoying and wants it to go away.   Also stable SOB per pt.  No better; no worse.  Just got pain meds, but doesn't think it helped sternal pain prior.    ROS: pt denies cardiac CP, endorses some SOB, abd pain, HA, vision changes, mood changes, N/V/C ; still loose stools but better overall  Objective:   No results found. Recent Labs    01/19/20 0534 01/20/20 0543  WBC 13.4* 14.6*  HGB 10.5* 10.6*  HCT 32.4* 33.2*  PLT 285 280   Recent Labs    01/19/20 0534 01/20/20 0543  NA 143 143  K 3.4* 4.2  CL 116* 117*  CO2 19* 18*  GLUCOSE 116* 112*  BUN 25* 29*  CREATININE 1.56* 1.48*  CALCIUM 8.6* 8.7*    Intake/Output Summary (Last 24 hours) at 01/20/2020 1003 Last data filed at 01/20/2020 0900 Gross per 24 hour  Intake 480 ml  Output 175 ml  Net 305 ml     Physical Exam:  Vital Signs Blood pressure 119/64, pulse (!) 57, temperature 97.6 F (36.4 C), temperature source Oral, resp. rate 18, height 5\' 8"  (1.727 m), weight 54 kg, SpO2 99 %. LABS and VITALS AND NURSING/STAFF NOTES REVIEWED Physical Exam  General: Alert and oriented; sitting up on EOB; no accessory muscle use and finishing breakfast, and taking pills for nurse at bedside, NAD HEENT: conjugate gaze; trachea midline Neck: Supple with no JVD Heart: Reg rate and rhythm. Mild murmur heard; no R/G Chest: very decreased breath sounds at bases B/L L>>R ; no coarse or rhonchi/wheezing again heard today  Abdomen: Soft, non-tender, non-distended, (+) BS Extremities: No clubbing, cyanosis, or edema. Pulses are 2+ Skin: Surgical incision on chest looks good, drain sites healing well- hard bump at top of CABG incision with no drainage- not puffy/fluid filled- could just be sternum, no  erythema Neuro: Pt is cognitively appropriate with normal insight, memory, and awareness.  Sensory exam is normal. Reflexes are 2+ in all 4's. Fine motor coordination is intact. No tremors. Motor function is grossly 5/5.  Musculoskeletal:  Parascapular wasting around the infraspinatus supraspinatus fossa decreased shoulder range of motion Psych: frustrated but brighter than yesterday   Assessment/Plan: 1. 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  Physiatrist is providing close team supervision and 24 hour management of active medical problems listed below.  Physiatrist and rehab team continue to assess barriers to discharge/monitor patient progress toward functional and medical goals  Care Tool:  Bathing    Body parts bathed by patient: Right arm, Left arm, Chest, Abdomen, Front perineal area, Buttocks, Right upper leg, Left upper leg, Right lower leg, Left lower leg, Face   Body parts bathed by helper: Chest, Abdomen, Right lower leg, Left lower leg     Bathing assist Assist Level: Supervision/Verbal cueing     Upper Body Dressing/Undressing Upper body dressing Upper body dressing/undressing activity did not occur (including orthotics): N/A What is the patient wearing?: Pull over shirt    Upper body assist Assist Level: Supervision/Verbal cueing    Lower Body Dressing/Undressing Lower body dressing      What is the patient wearing?: Pants  Lower body assist Assist for lower body dressing: Moderate Assistance - Patient 50 - 74%     Toileting Toileting    Toileting assist Assist for toileting: Contact Guard/Touching assist     Transfers Chair/bed transfer  Transfers assist     Chair/bed transfer assist level: Supervision/Verbal cueing Chair/bed transfer assistive device: Programmer, multimedia   Ambulation assist      Assist  level: Supervision/Verbal cueing Assistive device: Walker-rolling Max distance: 30'   Walk 10 feet activity   Assist     Assist level: Supervision/Verbal cueing Assistive device: Walker-rolling   Walk 50 feet activity   Assist    Assist level: Supervision/Verbal cueing Assistive device: Walker-rolling    Walk 150 feet activity   Assist Walk 150 feet activity did not occur: Safety/medical concerns  Assist level: Supervision/Verbal cueing Assistive device: Walker-rolling    Walk 10 feet on uneven surface  activity   Assist     Assist level: Minimal Assistance - Patient > 75% Assistive device: Aeronautical engineer Will patient use wheelchair at discharge?: No             Wheelchair 50 feet with 2 turns activity    Assist            Wheelchair 150 feet activity     Assist          Blood pressure 119/64, pulse (!) 57, temperature 97.6 F (36.4 C), temperature source Oral, resp. rate 18, height 5\' 8"  (1.727 m), weight 54 kg, SpO2 99 %.  Medical Problem List and Plan: 1.  Impaired mobility and ADLs secondary to cardiac debility s/p CABGx3.             -patient may shower             -ELOS/Goals: mod I in PT, OT, I in SLP             -2 weeks colchicine for pericardial irritation  -Continue CIR PT and OT  -con't to improve SOB issues 2.  Antithrombotics: -DVT/anticoagulation: Starting Eliquis today (2/5) 2.5 mg BID             -antiplatelet therapy: On ASA 3. Pain Management: Denies pain, not receiving any pain medications.  4. Mood: LCSW to follow for evaluations and support.              -antipsychotic agents:  N/A 5. Neuropsych: This patient is capable of making decisions on his own behalf. 6. Skin/Wound Care: Monitor incisions for healing.  7. Fluids/Electrolytes/Nutrition: Offer supplements between meals to promote wound healing.  8. AFib: Continue to monitor HR tid and with increase in activity. Continue  Amiodarone bid 9. Persistent Leucocytosis: WBC up to 16,000 +--infectious work up negative s/p IV Cefepime.   Thoracentesis performed by IR on 2/11, positive WBCs, no growth on culture thus far, repeat CBC in a.m.  2/15- WBC down to 13.2k- improved- still on Cefipime  -will wait to d/c since WBC is not normalized. Wait at least 1 more day (also had 5 days Cefipime earlier)  2/16- will stop Cefipime today since feeling better; stil afebrile, but never had temp of any kind.   2/17- WBC up from 13 to 14.6 today- just stopped Cefepime-will recheck in AM 10 Acute renal failure with hypernatremia: Need to encourage fluid intake.   2.6- Cr down to 1.23 and BUN down to 23- recovering- Na 145- so improving- con't regimen  2/8: Na  increased to 147.   2/10- Na back down and Cr down to 1.06- improving in spite of Cefipime and Vanc- will need to follow/monitor to make sure doesn't cause renal impairment on IV ABX.  Discussed with pharmacy, given that MRSA is negative will stop Vanco and continue cefepime  2/16- Cr up to 1.56  From 1.13 from aggressive diuresis- will monitor- might need to be a little dry right now due to lung issues. Replete KCL 40 mgEq x2 and recheck labs.  2/17- Per heart failure team, suggested starting Aldactone 12.5 mg daily along with Lasix 20 mEq daily and recheck CXR, which I was planning on doing. Is ordered Cr down to 1.48 and BUN 29 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 On ASA, Losartan, digoxin, coreg, Lipitor.   Filed Weights   01/15/20 0438 01/17/20 0500 01/18/20 0424  Weight: 56.2 kg 52.5 kg 54 kg    2/14 wt range 52.5-57kg  2/15- weight up slightly to 54kg, however in florid pulmonary edema- asked heart failure to come by- gave pt a dose of Lasix 40 mg IV and got EKG- cannot access; Cards suggested more Lasix- - will follow plan  2/16-  Pt feeling better; not back to baseline- although SOB better, Cr up to 1.56 and losing K+- will  need to replete and monitor Cr closely.  2/17- no weight x2 days  12. Thrombocytopenia: Has resolved.  13. Shortness of breath: Encourage IS. Continue  14. Constipation: Resolved. Can DC daily docusate, miralax, and dulcolax. Patient is refusing and moving bowels regularly.   2/6- having loose stools- due to colchicine likely- will d/c Colace and add Fibercon daily  2/7- improved per pt.   2/8: continues to improve.  2/11- looser stools- likely due to IV ABX- will increase Fibercon dose and if doesn't improve, will check CDIff- doesn't smell or sound like C Diff- however not diagnostic- will check    2/16- never had loose stool- so not sent 15. Hypernatremia  2/9- was 147 improved to 144 on 2/13, repeat in a.m. 16. Insomnia  Improved last night, patient quite pleased with his sleep at this time  2/12-increase trazodone to 50 mg QHS   2/16- sleeping well 17.  Disposition: Lives alone, but has supportive family, including daughter, nearby.      LOS: 12 days A FACE TO FACE EVALUATION WAS PERFORMED  David Perez 01/20/2020, 10:03 AM

## 2020-01-20 NOTE — Progress Notes (Signed)
Occupational Therapy Session Note  Patient Details  Name: David Perez MRN: [id number] Date of Birth: 25-Dec-1940  Today's Date: 01/20/2020 OT Individual Time: 3532-9924 OT Individual Time Calculation (min): 56 min    Short Term Goals: Week 1:  OT Short Term Goal 1 (Week 1): Pt will perform UB dressing with min A and min cuing to maintain precautions. OT Short Term Goal 1 - Progress (Week 1): Met OT Short Term Goal 2 (Week 1): Pt will perform bathing tasks with min A overall. OT Short Term Goal 2 - Progress (Week 1): Met OT Short Term Goal 3 (Week 1): PT will perform LB dressing with min A overall. OT Short Term Goal 3 - Progress (Week 1): Met  Skilled Therapeutic Interventions/Progress Updates:    1;1. Pt received in EOB awaiting OT arrival. Pt agreeable to shower this date with no pain reported. Pt wants to make "a plan" for set up of shower supplies to increase privacy. Pt completes supervision ambulatory transfers with RW to bathroom after doffing all clothing except socks and brief. Pt able to doff remaining articles in shower in seated after OT covers IV. Pt bathes with S and VC for rest breaks PRN d/t endruance deficits. Pt dresses EOB with S/u to don UB and MOD A for LB clothing d/t pacing needs as maintenance wanting to work on shower (leaking). Pt completes oral care seated for energy conservation with set up. Exited session with pt seated in w/c call light in reach and all needs met  Therapy Documentation Precautions:  Precautions Precautions: Sternal, Fall Precaution Comments: Reviewed sternal precautions Restrictions Weight Bearing Restrictions: Yes General:   Vital Signs: Therapy Vitals Temp: 97.6 F (36.4 C) Temp Source: Oral Pulse Rate: (!) 57 Resp: 18 BP: 119/64 Patient Position (if appropriate): Lying Oxygen Therapy SpO2: 99 % O2 Device: Room Air Pain:   ADL:   Vision   Perception    Praxis   Exercises:   Other Treatments:     Therapy/Group:  Individual Therapy  Tonny Branch 01/20/2020, 9:18 AM

## 2020-01-20 NOTE — Progress Notes (Signed)
Physical Therapy Session Note  Patient Details  Name: David Perez MRN: [id number] Date of Birth: 1941-05-09  Today's Date: 01/20/2020 PT Individual Time:  -      Short Term Goals: Week 2:  PT Short Term Goal 1 (Week 2): STG=LTG due to ELOS.  Skilled Therapeutic Interventions/Progress Updates:     Patient in w/c upon PT arrival. Patient alert and agreeable to PT session. Patient denied pain during session, continues to report fatigue and SOB, however, stated that it continues to improve each day  Therapeutic Activity: Bed Mobility: Patient performed supine to/from sit independently several times during session. Stated that is feels good to rest lying diagonal on the bed with his LEs off the bed to stretch his trunk, PT encouraged him to lay in this position if it provides him relief/comfort as patient appears safe in lying and performing lying to/from sitting.  Transfers: Patient performed sit to/from stand x8 with supervision with and without a RW, required 2 attempts to stand x2 due to decreased forward weight shift with hands on thighs. Provided verbal cues for forward weight shift x1, patient able to identify technical errors without cues x2.  Gait Training:  Patient ambulated 60 feet and 70 feet using RW with supervision and w/c follow due to decreased activity tolerance. Ambulated with decreased gait speed, decreased step length and height, increased B hip and knee flexion in stance, forward trunk lean, and downward head gaze. Provided verbal cues for erect posture, paced breathing, and increased step height. Ambulated with decreased gait speed and standing rest breaks x10 sec every 20 feet on second trial. RPE 7/10 after both trials.   Therapeutic Exercise: Patient performed the following exercises with verbal and tactile cues for proper technique. -sit to stands without UE support 2x5 -B LAQ 2x10 -standing heel/toe raises x5 Patient performed diaphragmatic breathing in sitting or  lying during rest breaks, he required rest breaks between each exercise due to fatigue. He also used the flutter valve x5 during one rest break, reporting that he tries to do this every 15 min as instructed by [name]. Noted patient was taking a breath between each trial with improved breath control.  Patient in bed with transport to go to x-ray at end of session. Patient continues to progress ambulation distance and exercise level each day, however, is still limited by SOB and fatigue.   Therapy Documentation Precautions:  Precautions Precautions: Sternal, Fall Precaution Comments: Reviewed sternal precautions Restrictions Weight Bearing Restrictions: Yes    Therapy/Group: Individual Therapy  David Perez PT, DPT  01/20/2020, 7:49 AM

## 2020-01-20 NOTE — Progress Notes (Signed)
Patient ID: David Perez, male   DOB: 03/12/1941, 79 y.o.   MRN: [id number]     Advanced Heart Failure Rounding Note  PCP-Cardiologist: No primary care provider on file.   Subjective:    1/28: CABG (LIMA-LAD, SVG-LPDA, SVG-OM1), Maze + LA appendage clip, bioprosthetic AVR.   Echo: EF 30%, small posterior pericardial effusion, decreased RV systolic function.   S/P Left Thoracentesis 01/14/20 with 1.4 liters removed.   Breathing better today, has been working with PT.  Creatinine mildly lower 1.48.    Objective:   Weight Range: 54 kg Body mass index is 18.1 kg/m.   Vital Signs:   Temp:  [97.5 F (36.4 C)-97.6 F (36.4 C)] 97.6 F (36.4 C) (02/17 0532) Pulse Rate:  [56-60] 57 (02/17 0532) Resp:  [14-22] 18 (02/17 0532) BP: (90-119)/(49-64) 119/64 (02/17 0532) SpO2:  [95 %-100 %] 99 % (02/17 0532) Last BM Date: 01/19/20  Weight change: Filed Weights   01/15/20 0438 01/17/20 0500 01/18/20 0424  Weight: 56.2 kg 52.5 kg 54 kg    Intake/Output:   Intake/Output Summary (Last 24 hours) at 01/20/2020 0906 Last data filed at 01/20/2020 0900 Gross per 24 hour  Intake 480 ml  Output 175 ml  Net 305 ml      Physical Exam  General: NAD Neck: No JVD, no thyromegaly or thyroid nodule.  Lungs: Decreased BS left base.  CV: Nondisplaced PMI.  Heart regular S1/S2, no S3/S4, no murmur.  No peripheral edema.  Abdomen: Soft, nontender, no hepatosplenomegaly, no distention.  Skin: Intact without lesions or rashes.  Neurologic: Alert and oriented x 3.  Psych: Normal affect. Extremities: No clubbing or cyanosis.  HEENT: Normal.    Labs    CBC Recent Labs    01/19/20 0534 01/20/20 0543  WBC 13.4* 14.6*  NEUTROABS 11.0* 11.8*  HGB 10.5* 10.6*  HCT 32.4* 33.2*  MCV 101.6* 104.1*  PLT 285 280   Basic Metabolic Panel Recent Labs    01/19/20 0534 01/20/20 0543  NA 143 143  K 3.4* 4.2  CL 116* 117*  CO2 19* 18*  GLUCOSE 116* 112*  BUN 25* 29*  CREATININE 1.56* 1.48*    CALCIUM 8.6* 8.7*   Liver Function Tests No results for input(s): AST, ALT, ALKPHOS, BILITOT, PROT, ALBUMIN in the last 72 hours. No results for input(s): LIPASE, AMYLASE in the last 72 hours. Cardiac Enzymes No results for input(s): CKTOTAL, CKMB, CKMBINDEX, TROPONINI in the last 72 hours.  BNP: BNP (last 3 results) No results for input(s): BNP in the last 8760 hours.  ProBNP (last 3 results) No results for input(s): PROBNP in the last 8760 hours.   D-Dimer No results for input(s): DDIMER in the last 72 hours. Hemoglobin A1C No results for input(s): HGBA1C in the last 72 hours. Fasting Lipid Panel No results for input(s): CHOL, HDL, LDLCALC, TRIG, CHOLHDL, LDLDIRECT in the last 72 hours. Thyroid Function Tests No results for input(s): TSH, T4TOTAL, T3FREE, THYROIDAB in the last 72 hours.  Invalid input(s): FREET3  Other results:   Imaging    No results found.   Medications:     Scheduled Medications: . amiodarone  200 mg Oral Daily  . apixaban  2.5 mg Oral Q12H  . aspirin  81 mg Oral Daily  . atorvastatin  80 mg Oral q1800  . carvedilol  3.125 mg Oral BID WC  . chlorhexidine  15 mL Mouth/Throat BID  . digoxin  0.0625 mg Oral Daily  . feeding supplement (ENSURE ENLIVE)    237 mL Oral TID BM  . levalbuterol  0.63 mg Nebulization TID WC & HS  . losartan  12.5 mg Oral Daily  . polycarbophil  1,250 mg Oral Daily  . traZODone  50 mg Oral QHS    Infusions:   PRN Medications: acetaminophen, alum & mag hydroxide-simeth, bisacodyl, diphenhydrAMINE, guaiFENesin-dextromethorphan, Melatonin, oxyCODONE, polyethylene glycol, prochlorperazine **OR** prochlorperazine **OR** prochlorperazine, sodium phosphate   Assessment/Plan   1. CAD: STEMI with very short LM and 99% ostial LAD. PCI thought to risk compromise of ostial large LCx, so decision made to defer PCI and transfer to [facility] for CABG as patient was CP-free at that point. Now s/p CABG with LIMA-LAD, SVG-OM2,  SVG-LPDA.  No chest pain.  - Continue ASA.  - Continue atorvastatin 80 mg daily 2. Cardiogenic shock: Pre-op echo with EF 35%.  Now s/p CABG-AVR.  Echo post-op with EF 30%, RV dysfunction. Milrinone discontinued.  Volume overloaded earlier in the week, got IV Lasix.  Creatinine bumped to 1.5, now 1.4.  - Start Lasix 20 mg po daily.  Follow BMET.   - Can add spironolactone 12.5 daily.   - Continue Coreg 3.125 mg bid.  - Continue spironolactone 25 daily.  - Continue digoxin 0.0625 daily, level ok yesterday. - Continue losartan 12.5 mg  - Repeat CXR today.  3. Atrial fibrillation: S/p Maze, LA appendage clip.  Regular on exam.  - Continue amiodarone 200 daily.    - Continue eliquis 2.5 mg twice a day. Lower dose with age and weight.  4. Hyperlipidemia: LDL 187. Goal <70.  - Treat w/ atorvastatin 80 qhs.  - Repeat FLP + HFTs in 6 weeks. If not at goal will need addition of Zetia +/- [id number]  5. Thrombocytopenia: Post-op, trending up. Platelets stable.  6. Acute hypoxemic respiratory failure: Pulmonary edema.  - Improved w/ diuresis, repeat CXR today.  7. AKI: Creatinine stable 1.4.  8. Delirium: Resolved.  9. ID: He completed course of cefepime in CIR.  10. Anemia: Post-op, got 1 unit PRBCs 1/31. Hgb stable.  11. Dysphagia: Passed swallow study, can stop free water.   12. Elevated LFTs: ?Shock liver from hypotensive episode immediately post-op. LFTs trended down.  RUQ US unremarkable.   13. Deconditioning.  Continue work with PT/OT 14. Hypernatremia: Resolved 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  CRP up but not ESR.   - Continued colchicine 2 wks, stopped today.   16. Left pleural effusion: 01/14/20 S/P Left thoracentesis 1.4 liters  - Moderate left effusion on 2/15 CXR, has been diuresed since then.  - Repeat CXR today.   Length of Stay: 23  Loralie Champagne, MD  01/20/2020, 9:06 AM  Advanced Heart Failure  Team Pager [phone] (M-F; 7a - 4p)  Please contact [facility] for night-coverage after hours (4p -7a ) and weekends on [email or web address]

## 2020-01-20 NOTE — Progress Notes (Signed)
Physical Therapy Session Note  Patient Details  Name: David Perez MRN: [id number] Date of Birth: 1941-05-20  Today's Date: 01/20/2020 PT Individual Time: 1305-1330 PT Individual Time Calculation (min): 25 min   Short Term Goals: Week 2:  PT Short Term Goal 1 (Week 2): STG=LTG due to ELOS.  Skilled Therapeutic Interventions/Progress Updates:   Pt received sitting in EOB and agreeable to PT. Pt reports that he is very fatigued from previous treatments, but willing to attempt PT. BLE instructed by PT with level 2 tband. LAQ, HS curl, hip abduction, hip flexion, hip extension. Each completed x 12 BLE with cues for decreased, full ROM and decreased assist from UE as tolerated. Sit<>stand without UE support x 8. Mild increased in SOB. Attempted 2nd bout but only able to complete 3 before being unable to continue. signifcant increase in SOB noted by pt stating that he needed to rest a little while. Pt left sitting EOB, mildly reclined to open chest and relieve pressure on stomach.         Therapy Documentation Precautions:  Precautions Precautions: Sternal, Fall Precaution Comments: Reviewed sternal precautions Restrictions Weight Bearing Restrictions: Yes   Pain: denies  Therapy/Group: Individual Therapy  Lorie Phenix 01/20/2020, 1:36 PM

## 2020-01-21 ENCOUNTER — Inpatient Hospital Stay (HOSPITAL_COMMUNITY): Payer: Medicare Other | Admitting: Physical Therapy

## 2020-01-21 ENCOUNTER — Inpatient Hospital Stay (HOSPITAL_COMMUNITY): Payer: Medicare Other | Admitting: Occupational Therapy

## 2020-01-21 LAB — CBC WITH DIFFERENTIAL/PLATELET
Abs Immature Granulocytes: 0.08 10*3/uL — ABNORMAL HIGH (ref 0.00–0.07)
Basophils Absolute: 0.1 10*3/uL (ref 0.0–0.1)
Basophils Relative: 1 %
Eosinophils Absolute: 0.3 10*3/uL (ref 0.0–0.5)
Eosinophils Relative: 3 %
HCT: 31.5 % — ABNORMAL LOW (ref 39.0–52.0)
Hemoglobin: 10.3 g/dL — ABNORMAL LOW (ref 13.0–17.0)
Immature Granulocytes: 1 %
Lymphocytes Relative: 8 %
Lymphs Abs: 1.1 10*3/uL (ref 0.7–4.0)
MCH: 33.6 pg (ref 26.0–34.0)
MCHC: 32.7 g/dL (ref 30.0–36.0)
MCV: 102.6 fL — ABNORMAL HIGH (ref 80.0–100.0)
Monocytes Absolute: 1.1 10*3/uL — ABNORMAL HIGH (ref 0.1–1.0)
Monocytes Relative: 8 %
Neutro Abs: 10.3 10*3/uL — ABNORMAL HIGH (ref 1.7–7.7)
Neutrophils Relative %: 79 %
Platelets: 281 10*3/uL (ref 150–400)
RBC: 3.07 MIL/uL — ABNORMAL LOW (ref 4.22–5.81)
RDW: 19.2 % — ABNORMAL HIGH (ref 11.5–15.5)
WBC: 13 10*3/uL — ABNORMAL HIGH (ref 4.0–10.5)
nRBC: 0.2 % (ref 0.0–0.2)

## 2020-01-21 LAB — BASIC METABOLIC PANEL
Anion gap: 6 (ref 5–15)
BUN: 28 mg/dL — ABNORMAL HIGH (ref 8–23)
CO2: 21 mmol/L — ABNORMAL LOW (ref 22–32)
Calcium: 8.7 mg/dL — ABNORMAL LOW (ref 8.9–10.3)
Chloride: 118 mmol/L — ABNORMAL HIGH (ref 98–111)
Creatinine, Ser: 1.46 mg/dL — ABNORMAL HIGH (ref 0.61–1.24)
GFR calc Af Amer: 52 mL/min — ABNORMAL LOW (ref >60)
GFR calc non Af Amer: 44 mL/min — ABNORMAL LOW (ref >60)
Glucose, Bld: 107 mg/dL — ABNORMAL HIGH (ref 70–99)
Potassium: 4.5 mmol/L (ref 3.5–5.1)
Sodium: 145 mmol/L (ref 135–145)

## 2020-01-21 MED ORDER — LEVALBUTEROL HCL 0.63 MG/3ML IN NEBU
0.6300 mg | INHALATION_SOLUTION | Freq: Four times a day (QID) | RESPIRATORY_TRACT | Status: DC | PRN
  Administered 2020-01-21: 09:00:00 0.63 mg via RESPIRATORY_TRACT
  Filled 2020-01-21: qty 3

## 2020-01-21 MED ORDER — LEVALBUTEROL HCL 0.63 MG/3ML IN NEBU
0.6300 mg | INHALATION_SOLUTION | Freq: Four times a day (QID) | RESPIRATORY_TRACT | Status: DC
  Administered 2020-01-21 – 2020-01-22 (×3): 0.63 mg via RESPIRATORY_TRACT
  Filled 2020-01-21 (×3): qty 3

## 2020-01-21 MED ORDER — LEVALBUTEROL HCL 0.63 MG/3ML IN NEBU
INHALATION_SOLUTION | RESPIRATORY_TRACT | Status: AC
  Administered 2020-01-21: 15:00:00 0.63 mg via RESPIRATORY_TRACT
  Filled 2020-01-21: qty 3

## 2020-01-21 NOTE — Progress Notes (Signed)
Patient ID: David Perez, male   DOB: 12/20/1940, 79 y.o.   MRN: [id number]     Advanced Heart Failure Rounding Note  PCP-Cardiologist: No primary care provider on file.   Subjective:    1/28: CABG (LIMA-LAD, SVG-LPDA, SVG-OM1), Maze + LA appendage clip, bioprosthetic AVR.   Echo: EF 30%, small posterior pericardial effusion, decreased RV systolic function.   S/P Left Thoracentesis 01/14/20 with 1.4 liters removed.   Stable creatinine.  Fatigues with PT but overall doing ok.    Objective:   Weight Range: 46.7 kg Body mass index is 15.65 kg/m.   Vital Signs:   Temp:  [98.3 F (36.8 C)-98.6 F (37 C)] 98.6 F (37 C) (02/18 0359) Pulse Rate:  [56-57] 56 (02/18 0359) Resp:  [18] 18 (02/17 1349) BP: (99-130)/(60-67) 130/60 (02/18 0359) SpO2:  [79 %-100 %] 100 % (02/18 0359) Weight:  [46.7 kg] 46.7 kg (02/18 0500) Last BM Date: 01/20/20  Weight change: Filed Weights   01/17/20 0500 01/18/20 0424 01/21/20 0500  Weight: 52.5 kg 54 kg 46.7 kg    Intake/Output:   Intake/Output Summary (Last 24 hours) at 01/21/2020 0855 Last data filed at 01/21/2020 0800 Gross per 24 hour  Intake 840 ml  Output --  Net 840 ml      Physical Exam  General: NAD Neck: No JVD, no thyromegaly or thyroid nodule.  Lungs: Decreased BS left base. CV: Nondisplaced PMI.  Heart regular S1/S2, no S3/S4, no murmur.  No peripheral edema.  No carotid bruit.  Normal pedal pulses.  Abdomen: Soft, nontender, no hepatosplenomegaly, no distention.  Skin: Intact without lesions or rashes.  Neurologic: Alert and oriented x 3.  Psych: Normal affect. Extremities: No clubbing or cyanosis.  HEENT: Normal.    Labs    CBC Recent Labs    01/20/20 0543 01/21/20 0627  WBC 14.6* 13.0*  NEUTROABS 11.8* 10.3*  HGB 10.6* 10.3*  HCT 33.2* 31.5*  MCV 104.1* 102.6*  PLT 280 585   Basic Metabolic Panel Recent Labs    01/20/20 0543 01/21/20 0627  NA 143 145  K 4.2 4.5  CL 117* 118*  CO2 18* 21*  GLUCOSE  112* 107*  BUN 29* 28*  CREATININE 1.48* 1.46*  CALCIUM 8.7* 8.7*   Liver Function Tests No results for input(s): AST, ALT, ALKPHOS, BILITOT, PROT, ALBUMIN in the last 72 hours. No results for input(s): LIPASE, AMYLASE in the last 72 hours. Cardiac Enzymes No results for input(s): CKTOTAL, CKMB, CKMBINDEX, TROPONINI in the last 72 hours.  BNP: BNP (last 3 results) No results for input(s): BNP in the last 8760 hours.  ProBNP (last 3 results) No results for input(s): PROBNP in the last 8760 hours.   D-Dimer No results for input(s): DDIMER in the last 72 hours. Hemoglobin A1C No results for input(s): HGBA1C in the last 72 hours. Fasting Lipid Panel No results for input(s): CHOL, HDL, LDLCALC, TRIG, CHOLHDL, LDLDIRECT in the last 72 hours. Thyroid Function Tests No results for input(s): TSH, T4TOTAL, T3FREE, THYROIDAB in the last 72 hours.  Invalid input(s): FREET3  Other results:   Imaging    DG Chest 2 View  Result Date: 01/20/2020 CLINICAL DATA:  CHF EXAM: CHEST - 2 VIEW COMPARISON:  01/18/2020 FINDINGS: Mild cardiomegaly. Prior CABG and valve replacement. Small left pleural effusion with left base atelectasis. There is hyperinflation of the lungs compatible with COPD. No overt edema. No acute bony abnormality. IMPRESSION: COPD, cardiomegaly. Small left pleural effusion with left base atelectasis. Electronically Signed  By: Rolm Baptise M.D.   On: 01/20/2020 12:27     Medications:     Scheduled Medications: . amiodarone  200 mg Oral Daily  . apixaban  2.5 mg Oral Q12H  . aspirin  81 mg Oral Daily  . atorvastatin  80 mg Oral q1800  . carvedilol  3.125 mg Oral BID WC  . chlorhexidine  15 mL Mouth/Throat BID  . digoxin  0.0625 mg Oral Daily  . feeding supplement (ENSURE ENLIVE)  237 mL Oral TID BM  . furosemide  20 mg Oral Daily  . losartan  12.5 mg Oral Daily  . polycarbophil  1,250 mg Oral Daily  . potassium chloride  20 mEq Oral Daily  . spironolactone  12.5  mg Oral Daily  . traZODone  50 mg Oral QHS    Infusions:   PRN Medications: acetaminophen, alum & mag hydroxide-simeth, bisacodyl, diphenhydrAMINE, guaiFENesin-dextromethorphan, levalbuterol, Melatonin, oxyCODONE, polyethylene glycol, prochlorperazine **OR** prochlorperazine **OR** prochlorperazine, sodium phosphate   Assessment/Plan   1. CAD: STEMI with very short LM and 99% ostial LAD. PCI thought to risk compromise of ostial large LCx, so decision made to defer PCI and transfer to [facility] for CABG as patient was CP-free at that point. Now s/p CABG with LIMA-LAD, SVG-OM2, SVG-LPDA.  No chest pain.  - Continue ASA.  - Continue atorvastatin 80 mg daily 2. Cardiogenic shock: Pre-op echo with EF 35%.  Now s/p CABG-AVR.  Echo post-op with EF 30%, RV dysfunction. Milrinone discontinued.  Volume overloaded earlier in the week, got IV Lasix.  Creatinine bumped to 1.5, now 1.4 and stable. Volume status looks ok now.  - Continue Lasix 20 mg daily. Follow BMET.    - Continue spironolactone 12.5 daily.   - Continue Coreg 3.125 mg bid.  - Continue spironolactone 25 daily.  - Continue digoxin 0.0625 daily, level ok recently. - Continue losartan 12.5 mg  3. Atrial fibrillation: S/p Maze, LA appendage clip.  Regular on exam.  - Continue amiodarone 200 daily.    - Continue eliquis 2.5 mg twice a day. Lower dose with age and weight.  4. Hyperlipidemia: LDL 187. Goal <70.  - Treat w/ atorvastatin 80 qhs.  - Repeat FLP + HFTs in 6 weeks. If not at goal will need addition of Zetia +/- PCSK9i  5. Thrombocytopenia: Post-op, trending up. Platelets stable.  6. Acute hypoxemic respiratory failure: Pulmonary edema.  - Improved w/ diuresis. 7. AKI: Creatinine stable 1.4.  8. Delirium: Resolved.  9. ID: He completed course of cefepime in CIR.  10. Elevated LFTs: ?Shock liver from hypotensive episode immediately post-op. LFTs trended down.  RUQ US unremarkable. 11. Deconditioning.  Continue work with PT/OT 12.  ?Post-pericardiotomy syndrome: Friction rub with pleuritic chest pain and small posterior effusion on echo.  CRP up but not ESR.   - Completed 2 wk course of colchicine.  12. Left pleural effusion: 01/14/20 S/P Left thoracentesis 1.4 liters  - Small left effusion on 2/17 CXR.   He seems to be stable.  Will follow from a distance for now.  Call with questions. Will arrange followup after discharge.    Length of Stay: 11  Loralie Champagne, MD  01/21/2020, 8:55 AM  Advanced Heart Failure Team Pager [phone] (M-F; 7a - 4p)  Please contact [facility] for night-coverage after hours (4p -7a ) and weekends on [email or web address]

## 2020-01-21 NOTE — Progress Notes (Signed)
[place] PHYSICAL MEDICINE & REHABILITATION PROGRESS NOTE   Subjective/Complaints:  Still having L sternal noncardiac reproducible chest discomfort.   Still having SOB- stable- but admits refused Xopenex last night.  Resp offered, but refused based on their description- explained very clearly that fluid is gone, but sounds tight and needs to have Nebs to breathe better.   ROS: pt denies cardiac CP, endorses some SOB, abd pain, HA, vision changes, mood changes, N/V/C ; still loose stools but better overall  Objective:   DG Chest 2 View  Result Date: 01/20/2020 CLINICAL DATA:  CHF EXAM: CHEST - 2 VIEW COMPARISON:  01/18/2020 FINDINGS: Mild cardiomegaly. Prior CABG and valve replacement. Small left pleural effusion with left base atelectasis. There is hyperinflation of the lungs compatible with COPD. No overt edema. No acute bony abnormality. IMPRESSION: COPD, cardiomegaly. Small left pleural effusion with left base atelectasis. Electronically Signed   By: Rolm Baptise M.D.   On: 01/20/2020 12:27   Recent Labs    01/20/20 0543 01/21/20 0627  WBC 14.6* 13.0*  HGB 10.6* 10.3*  HCT 33.2* 31.5*  PLT 280 281   Recent Labs    01/20/20 0543 01/21/20 0627  NA 143 145  K 4.2 4.5  CL 117* 118*  CO2 18* 21*  GLUCOSE 112* 107*  BUN 29* 28*  CREATININE 1.48* 1.46*  CALCIUM 8.7* 8.7*    Intake/Output Summary (Last 24 hours) at 01/21/2020 1046 Last data filed at 01/21/2020 0800 Gross per 24 hour  Intake 600 ml  Output --  Net 600 ml     Physical Exam:  Vital Signs Blood pressure 130/60, pulse (!) 56, temperature 98.6 F (37 C), temperature source Oral, resp. rate 18, height 5\' 8"  (1.727 m), weight 46.7 kg, SpO2 96 %. LABS and VITALS AND NURSING/STAFF NOTES REVIEWED Physical Exam  General: Alert and oriented; sitting up on EOB; no accessory muscle use and taking pills for RN; more spitfire;NAD HEENT: conjugate gaze; trachea midline Neck: Supple with no JVD Heart: Reg rate and  rhythm. Mild murmur heard; no R/G Chest: very decreased breath sounds at bases B/L L>>R ; no coarse or rhonchi/wheezing again heard today; sounds tight when lsitens Abdomen: Soft, non-tender, non-distended, (+) BS Extremities: No clubbing, cyanosis, or edema. Pulses are 2+ Skin: Surgical incision on chest looks good, drain sites healing well- hard bump at top of CABG incision with no drainage- not puffy/fluid filled- could just be sternum, no erythema Neuro: Pt is cognitively appropriate with normal insight, memory, and awareness.  Sensory exam is normal. Reflexes are 2+ in all 4's. Fine motor coordination is intact. No tremors. Motor function is grossly 5/5.  Musculoskeletal:  Parascapular wasting around the infraspinatus supraspinatus fossa decreased shoulder range of motion Psych: frustrated but brighter than yesterday   Assessment/Plan: 1. 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  Physiatrist is providing close team supervision and 24 hour management of active medical problems listed below.  Physiatrist and rehab team continue to assess barriers to discharge/monitor patient progress toward functional and medical goals  Care Tool:  Bathing    Body parts bathed by patient: Right arm, Left arm, Chest, Abdomen, Front perineal area, Buttocks, Right upper leg, Left upper leg, Right lower leg, Left lower leg, Face   Body parts bathed by helper: Chest, Abdomen, Right lower leg, Left lower leg     Bathing assist Assist Level: Supervision/Verbal cueing  Upper Body Dressing/Undressing Upper body dressing Upper body dressing/undressing activity did not occur (including orthotics): N/A What is the patient wearing?: Pull over shirt    Upper body assist Assist Level: Supervision/Verbal cueing    Lower Body Dressing/Undressing Lower body dressing      What is the  patient wearing?: Pants     Lower body assist Assist for lower body dressing: Moderate Assistance - Patient 50 - 74%     Toileting Toileting    Toileting assist Assist for toileting: Contact Guard/Touching assist     Transfers Chair/bed transfer  Transfers assist     Chair/bed transfer assist level: Contact Guard/Touching assist Chair/bed transfer assistive device: Programmer, multimedia   Ambulation assist      Assist level: Supervision/Verbal cueing Assistive device: Walker-rolling Max distance: 30'   Walk 10 feet activity   Assist     Assist level: Supervision/Verbal cueing Assistive device: Walker-rolling   Walk 50 feet activity   Assist    Assist level: Supervision/Verbal cueing Assistive device: Walker-rolling    Walk 150 feet activity   Assist Walk 150 feet activity did not occur: Safety/medical concerns  Assist level: Supervision/Verbal cueing Assistive device: Walker-rolling    Walk 10 feet on uneven surface  activity   Assist     Assist level: Minimal Assistance - Patient > 75% Assistive device: Aeronautical engineer Will patient use wheelchair at discharge?: No             Wheelchair 50 feet with 2 turns activity    Assist            Wheelchair 150 feet activity     Assist          Blood pressure 130/60, pulse (!) 56, temperature 98.6 F (37 C), temperature source Oral, resp. rate 18, height 5\' 8"  (1.727 m), weight 46.7 kg, SpO2 96 %.  Medical Problem List and Plan: 1.  Impaired mobility and ADLs secondary to cardiac debility s/p CABGx3.             -patient may shower             -ELOS/Goals: mod I in PT, OT, I in SLP             -2 weeks colchicine for pericardial irritation  -Continue CIR PT and OT  -con't to improve SOB issues 2.  Antithrombotics: -DVT/anticoagulation: Starting Eliquis today (2/5) 2.5 mg BID             -antiplatelet therapy: On ASA 3. Pain  Management: Denies pain, not receiving any pain medications.  4. Mood: LCSW to follow for evaluations and support.              -antipsychotic agents:  N/A 5. Neuropsych: This patient is capable of making decisions on his own behalf. 6. Skin/Wound Care: Monitor incisions for healing.  7. Fluids/Electrolytes/Nutrition: Offer supplements between meals to promote wound healing.  8. AFib: Continue to monitor HR tid and with increase in activity. Continue Amiodarone bid 9. Persistent Leucocytosis: WBC up to 16,000 +--infectious work up negative s/p IV Cefepime.   Thoracentesis performed by IR on 2/11, positive WBCs, no growth on culture thus far, repeat CBC in a.m.  2/15- WBC down to 13.2k- improved- still on Cefipime  -will wait to d/c since WBC is not normalized. Wait at least 1 more day (also had 5 days Cefipime earlier)  2/16- will stop Cefipime today since feeling  better; stil afebrile, but never had temp of any kind.   2/17- WBC up from 13 to 14.6 today- just stopped Cefepime-will recheck in AM  2/18- WBC down to 13k- so won't add back any ABX.  10 Acute renal failure with hypernatremia: Need to encourage fluid intake.   2.6- Cr down to 1.23 and BUN down to 23- recovering- Na 145- so improving- con't regimen  2/8: Na increased to 147.   2/10- Na back down and Cr down to 1.06- improving in spite of Cefipime and Vanc- will need to follow/monitor to make sure doesn't cause renal impairment on IV ABX.  Discussed with pharmacy, given that MRSA is negative will stop Vanco and continue cefepime  2/16- Cr up to 1.56  From 1.13 from aggressive diuresis- will monitor- might need to be a little dry right now due to lung issues. Replete KCL 40 mgEq x2 and recheck labs.  2/17- Per heart failure team, suggested starting Aldactone 12.5 mg daily along with Lasix 20 mEq daily and recheck CXR, which I was planning on doing. Is ordered Cr down to 1.48 and BUN 29  2/18- Cr down to 1.47 - very slightly down.  11.  Acute systolic CHF:  Monitor for signs of overload and check daily weights. On ASA, Losartan, digoxin, coreg, Lipitor.   Filed Weights   01/17/20 0500 01/18/20 0424 01/21/20 0500  Weight: 52.5 kg 54 kg 46.7 kg    2/14 wt range 52.5-57kg  2/15- weight up slightly to 54kg, however in florid pulmonary edema- asked heart failure to come by- gave pt a dose of Lasix 40 mg IV and got EKG- cannot access; Cards suggested more Lasix- - will follow plan  2/16-  Pt feeling better; not back to baseline- although SOB better, Cr up to 1.56 and losing K+- will need to replete and monitor Cr closely.  2/17- no weight x2 days  2/18- weight down to 46.7kg- - not back to baseline, but think pt tight/needs nebs- will recommend to take.  12. Thrombocytopenia: Has resolved.  13. Shortness of breath: Encourage IS. Continue  14. Constipation: Resolved. Can DC daily docusate, miralax, and dulcolax. Patient is refusing and moving bowels regularly.   2/6- having loose stools- due to colchicine likely- will d/c Colace and add Fibercon daily  2/7- improved per pt.   2/8: continues to improve.  2/11- looser stools- likely due to IV ABX- will increase Fibercon dose and if doesn't improve, will check CDIff- doesn't smell or sound like C Diff- however not diagnostic- will check    2/16- never had loose stool- so not sent 15. Hypernatremia  2/9- was 147 improved to 144 on 2/13, repeat in a.m. 16. Insomnia  Improved last night, patient quite pleased with his sleep at this time  2/12-increase trazodone to 50 mg QHS   2/16- sleeping well 17.  Disposition: Lives alone, but has supportive family, including daughter, nearby.   2/18- moved d/c date to next Tuesday.      LOS: 13 days A FACE TO FACE EVALUATION WAS PERFORMED  David Perez 01/21/2020, 10:46 AM

## 2020-01-21 NOTE — Progress Notes (Signed)
Physical Therapy Session Note  Patient Details  Name: David Perez MRN: [id number] Date of Birth: 04-06-1941  Today's Date: 01/21/2020 PT Individual Time: 1102-1158 PT Individual Time Calculation (min): 56 min   Short Term Goals: Week 3:     Skilled Therapeutic Interventions/Progress Updates: Pt presents in w/c agreeable to participate w/ therapy.  Pt performed multiple sit to stand transfers w/ Supervision.  Pt upset if touching when transferring.  Pt performed LE there ex to increase strength and endurance.  Pt performed calf raises, LAQ, hip flexion abd/add 3 x 15w/ rest breaks required d/t c/o fatigue. Very difficult to get accurate O2 numbers on pt d/t cold hands.  Monitored 80% on RA but unsure of accuracy.  Will continue to allow rest breaks.  Pt performed standing reaching outside of BOS and throwing sand bags on solid and cushioned surface to challenge balance.  Pt requires occasional rest break of 2-3' during performance d/t fatigue.  Pt amb short distance of 20' multiple trials including into BR.  Pt left w/ NT in BR.     Therapy Documentation Precautions:  Precautions Precautions: Sternal, Fall Precaution Comments: Reviewed sternal precautions Restrictions Weight Bearing Restrictions: No General: PT Amount of Missed Time (min): 15 Minutes PT Missed Treatment Reason: Patient fatigue Vital Signs: Oxygen Therapy SpO2: 96 % O2 Device: Room Air Pain:   Pt states 6/10 pain at incision w/ meds.    Therapy/Group: Individual Therapy  Ladoris Gene 01/21/2020, 12:32 PM

## 2020-01-21 NOTE — Progress Notes (Signed)
Occupational Therapy Session Note  Patient Details  Name: David Perez MRN: [id number] Date of Birth: 16-Aug-1941  Today's Date: 01/21/2020 OT Individual Time: 1400-1500 OT Individual Time Calculation (min): 60 min    Short Term Goals: Week 1:  OT Short Term Goal 1 (Week 1): Pt will perform UB dressing with min A and min cuing to maintain precautions. OT Short Term Goal 1 - Progress (Week 1): Met OT Short Term Goal 2 (Week 1): Pt will perform bathing tasks with min A overall. OT Short Term Goal 2 - Progress (Week 1): Met OT Short Term Goal 3 (Week 1): PT will perform LB dressing with min A overall. OT Short Term Goal 3 - Progress (Week 1): Met Week 2:  OT Short Term Goal 1 (Week 2): STG = LTG  Skilled Therapeutic Interventions/Progress Updates:    1:1 Pt in bed when arrived. Pt came to EOB and transferred into w/c with supervision. Pt transported down to the tub room and practiced tub bench transfer into tub bench with supervision with cues for instruction. Pt ambulated back out of bathroom with RW with supervision. Pt taken to the gym and performed transfer to the mat. Performed diaphragmatic    breathing in sitting in front of mirror and then in supine with use of a light peg board (supine) and a tissue ( sitting ) to achieve visual feedback of deeper breathes. Also perform LE exercises providing strength and core training for improved performance with ADLs in sidelying (including clam shell, leg lifts and hip extension). Also climbed 4 6 inch stairs with left rail with supervision. Returned to room and ambulate short distance without RW with close supervision to get back into bed. Left resting in bed.   Therapy Documentation Precautions:  Precautions Precautions: Sternal, Fall Precaution Comments: Reviewed sternal precautions Restrictions Weight Bearing Restrictions: No Pain: No c/o pain in session   Therapy/Group: Individual Therapy  [name] [facility] 01/21/2020, 3:08 PM

## 2020-01-21 NOTE — Progress Notes (Signed)
Physical Therapy Session Note  Patient Details  Name: David Perez MRN: [id number] Date of Birth: 06-Mar-1941  Today's Date: 01/21/2020 PT Individual Time: 0905-1005 PT Individual Time Calculation (min): 60 min   Short Term Goals: Week 2:  PT Short Term Goal 1 (Week 2): STG=LTG due to ELOS.  Skilled Therapeutic Interventions/Progress Updates:   Pt received in supine and agreeable to therapy, denies pain but reports significant SOB w/ ambulating to sink/toilet this AM. Therapist discussed w/ MD who is aware of pt's decline in activity tolerance over last few days. Per MD, pt's desats are OK as long as he recovers to baseline within a reasonable amount of time. Supervision bed mobility and CGA stand pivot to w/c. Total assist w/c transport to/from therapy gym for time management. Worked on strengthening and endurance, therapist w/ difficulty getting accurate reading on pulse ox, however no SOB or increased work of breathing at rest. Observed for signs of symptoms of desaturation or increased HR throughout session. Performed exercises listed below. Moderate increase in work of breathing with each rep, decreases w/ seated rest and pursed lip breathing. Pt returns to baseline breathing rate within 2-3 min of rest. Asked pt to rate on RPE scale, pt expresses frustration w/ using that scale as he has difficulty rating his exertion. Extensive education throughout session regarding energy conservation strategies, awareness of body response to exercise, pacing, and realistic post-op recovery expectations. Pt verbalized understanding of all education.   -5x sit<>stands x3 bouts -seated knee marches 3x10  -kinetron @ level 70 cm/sec, 1 min x1, 30 sec x3   After kinetron, pt reported he didn't feel he could do anything further 2/2 fatigue. Returned to room total assist via w/c. Pt reported he needed to toilet. Stand pivot to/from toilet w/ supervision including LE garment management. Pt continent of void. Ended  session in w/c, all needs in reach. Missed 15 min of skilled PT 2/2 fatigue.   Therapy Documentation Precautions:  Precautions Precautions: Sternal, Fall Precaution Comments: Reviewed sternal precautions Restrictions Weight Bearing Restrictions: No  Therapy/Group: Individual Therapy  Tien Aispuro Clent Demark 01/21/2020, 10:40 AM

## 2020-01-22 ENCOUNTER — Inpatient Hospital Stay (HOSPITAL_COMMUNITY): Payer: Medicare Other | Admitting: Occupational Therapy

## 2020-01-22 ENCOUNTER — Inpatient Hospital Stay (HOSPITAL_COMMUNITY): Payer: Medicare Other | Admitting: Physical Therapy

## 2020-01-22 ENCOUNTER — Inpatient Hospital Stay (HOSPITAL_COMMUNITY): Payer: Medicare Other

## 2020-01-22 LAB — BASIC METABOLIC PANEL
Anion gap: 7 (ref 5–15)
BUN: 27 mg/dL — ABNORMAL HIGH (ref 8–23)
CO2: 21 mmol/L — ABNORMAL LOW (ref 22–32)
Calcium: 8.7 mg/dL — ABNORMAL LOW (ref 8.9–10.3)
Chloride: 115 mmol/L — ABNORMAL HIGH (ref 98–111)
Creatinine, Ser: 1.5 mg/dL — ABNORMAL HIGH (ref 0.61–1.24)
GFR calc Af Amer: 50 mL/min — ABNORMAL LOW (ref >60)
GFR calc non Af Amer: 43 mL/min — ABNORMAL LOW (ref >60)
Glucose, Bld: 101 mg/dL — ABNORMAL HIGH (ref 70–99)
Potassium: 4.2 mmol/L (ref 3.5–5.1)
Sodium: 143 mmol/L (ref 135–145)

## 2020-01-22 MED ORDER — LEVALBUTEROL HCL 0.63 MG/3ML IN NEBU
0.6300 mg | INHALATION_SOLUTION | Freq: Four times a day (QID) | RESPIRATORY_TRACT | Status: DC
  Administered 2020-01-22 – 2020-01-25 (×12): 0.63 mg via RESPIRATORY_TRACT
  Filled 2020-01-22 (×12): qty 3

## 2020-01-22 MED ORDER — LEVALBUTEROL HCL 0.63 MG/3ML IN NEBU
0.6300 mg | INHALATION_SOLUTION | Freq: Four times a day (QID) | RESPIRATORY_TRACT | Status: DC | PRN
  Administered 2020-01-22: 0.63 mg via RESPIRATORY_TRACT
  Filled 2020-01-22: qty 3

## 2020-01-22 NOTE — Progress Notes (Signed)
[place] PHYSICAL MEDICINE & REHABILITATION PROGRESS NOTE   Subjective/Complaints:  Pt reports good night's sleep- feeling better than did, although not back to new baseline like was last Sunday- ate entire breakfast.   Not on O2- spoke to PT- OK to put on low dose O2 if need be when gets SOB, if O2 sats drop and can't get them back up- Also spoke to nursing- make SURE pt takes Xopenex nebs q6 hours whether wants them or not, so can see if this is cause of SOB, DOE.   ROS: pt still having noncardiac CP from sternum/from CABG- sore to touch; SOB better; no N/V/C/D Objective:   DG Chest 2 View  Result Date: 01/20/2020 CLINICAL DATA:  CHF EXAM: CHEST - 2 VIEW COMPARISON:  01/18/2020 FINDINGS: Mild cardiomegaly. Prior CABG and valve replacement. Small left pleural effusion with left base atelectasis. There is hyperinflation of the lungs compatible with COPD. No overt edema. No acute bony abnormality. IMPRESSION: COPD, cardiomegaly. Small left pleural effusion with left base atelectasis. Electronically Signed   By: Rolm Baptise M.D.   On: 01/20/2020 12:27   Recent Labs    01/20/20 0543 01/21/20 0627  WBC 14.6* 13.0*  HGB 10.6* 10.3*  HCT 33.2* 31.5*  PLT 280 281   Recent Labs    01/21/20 0627 01/22/20 0526  NA 145 143  K 4.5 4.2  CL 118* 115*  CO2 21* 21*  GLUCOSE 107* 101*  BUN 28* 27*  CREATININE 1.46* 1.50*  CALCIUM 8.7* 8.7*    Intake/Output Summary (Last 24 hours) at 01/22/2020 0955 Last data filed at 01/22/2020 0730 Gross per 24 hour  Intake 560 ml  Output 600 ml  Net -40 ml     Physical Exam:  Vital Signs Blood pressure (!) 121/54, pulse (!) 52, temperature 98.1 F (36.7 C), temperature source Oral, resp. rate 17, height 5\' 8"  (1.727 m), weight 47.6 kg, SpO2 99 %.  Labs and vitals and nursing notes reviewed  Physical Exam  General: at first laying back, but then sat up easily in bed; finished breakfast, appears comfortable, NAD HEENT: conjugate gaze;  trachea midline Neck: Supple with no JVD Heart:  RRR, mild murmur, no R/G Chest: no wheezing, or rhonchi/rales/crackles; decreased breath sounds at bases L>R, but B/L Abdomen: soft, concave, NT, ND, (+) BS hyperactive after breakfast Extremities: No clubbing, cyanosis, or edema. Pulses are 2+ Skin: CABG incision good- has hard bump at top- no draiange or erythema Neuro: Pt is cognitively appropriate with normal insight, memory, and awareness.  Sensory exam is normal. Reflexes are 2+ in all 4's. Fine motor coordination is intact. No tremors. Motor function is grossly 5/5.  Musculoskeletal:  Parascapular wasting around the infraspinatus supraspinatus fossa decreased shoulder range of motion Psych:serious but appropriate   Assessment/Plan: 1. 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  Physiatrist is providing close team supervision and 24 hour management of active medical problems listed below.  Physiatrist and rehab team continue to assess barriers to discharge/monitor patient progress toward functional and medical goals  Care Tool:  Bathing    Body parts bathed by patient: Right arm, Left arm, Chest, Abdomen, Front perineal area, Buttocks, Right upper leg, Left upper leg, Right lower leg, Left lower leg, Face   Body parts bathed by helper: Chest, Abdomen, Right lower leg, Left lower leg     Bathing assist Assist Level: Supervision/Verbal cueing  Upper Body Dressing/Undressing Upper body dressing Upper body dressing/undressing activity did not occur (including orthotics): N/A What is the patient wearing?: Pull over shirt    Upper body assist Assist Level: Supervision/Verbal cueing    Lower Body Dressing/Undressing Lower body dressing      What is the patient wearing?: Pants     Lower body assist Assist for lower body dressing: Moderate Assistance -  Patient 50 - 74%     Toileting Toileting    Toileting assist Assist for toileting: Contact Guard/Touching assist     Transfers Chair/bed transfer  Transfers assist     Chair/bed transfer assist level: Contact Guard/Touching assist Chair/bed transfer assistive device: Programmer, multimedia   Ambulation assist      Assist level: Supervision/Verbal cueing Assistive device: Walker-rolling Max distance: 30'   Walk 10 feet activity   Assist     Assist level: Supervision/Verbal cueing Assistive device: Walker-rolling   Walk 50 feet activity   Assist    Assist level: Supervision/Verbal cueing Assistive device: Walker-rolling    Walk 150 feet activity   Assist Walk 150 feet activity did not occur: Safety/medical concerns  Assist level: Supervision/Verbal cueing Assistive device: Walker-rolling    Walk 10 feet on uneven surface  activity   Assist     Assist level: Minimal Assistance - Patient > 75% Assistive device: Aeronautical engineer Will patient use wheelchair at discharge?: No             Wheelchair 50 feet with 2 turns activity    Assist            Wheelchair 150 feet activity     Assist          Blood pressure (!) 121/54, pulse (!) 52, temperature 98.1 F (36.7 C), temperature source Oral, resp. rate 17, height 5\' 8"  (1.727 m), weight 47.6 kg, SpO2 99 %.  Medical Problem List and Plan: 1.  Impaired mobility and ADLs secondary to cardiac debility s/p CABGx3.             -patient may shower             -ELOS/Goals: mod I in PT, OT, I in SLP             -2 weeks colchicine for pericardial irritation  -Continue CIR PT and OT  -con't to improve SOB issues 2.  Antithrombotics: -DVT/anticoagulation: Starting Eliquis today (2/5) 2.5 mg BID             -antiplatelet therapy: On ASA 3. Pain Management: Denies pain, not receiving any pain medications.  4. Mood: LCSW to follow for  evaluations and support.              -antipsychotic agents:  N/A 5. Neuropsych: This patient is capable of making decisions on his own behalf. 6. Skin/Wound Care: Monitor incisions for healing.  7. Fluids/Electrolytes/Nutrition: Offer supplements between meals to promote wound healing.  8. AFib: Continue to monitor HR tid and with increase in activity. Continue Amiodarone bid 9. Persistent Leucocytosis/pneumonia: WBC up to 16,000 +--infectious work up negative s/p IV Cefepime.   Thoracentesis performed by IR on 2/11, positive WBCs, no growth on culture thus far, repeat CBC in a.m.  2/15- WBC down to 13.2k- improved- still on Cefipime  -will wait to d/c since WBC is not normalized. Wait at least 1 more day (also had 5 days Cefipime earlier)  2/16- will stop Cefipime today since  feeling better; stil afebrile, but never had temp of any kind.   2/17- WBC up from 13 to 14.6 today- just stopped Cefepime-will recheck in AM  2/18- WBC down to 13k- so won't add back any ABX.  2/19- will recheck labs Monday; unless more symptomatic; has BMP until tomorrow; Off ABX; con't nebs q6 hours scheduled- reordered them; if doesn't get better, might benefit from some PO steroids.   10 Acute renal failure with hypernatremia: Need to encourage fluid intake.   2.6- Cr down to 1.23 and BUN down to 23- recovering- Na 145- so improving- con't regimen  2/8: Na increased to 147.   2/10- Na back down and Cr down to 1.06- improving in spite of Cefipime and Vanc- will need to follow/monitor to make sure doesn't cause renal impairment on IV ABX.  Discussed with pharmacy, given that MRSA is negative will stop Vanco and continue cefepime  2/16- Cr up to 1.56  From 1.13 from aggressive diuresis- will monitor- might need to be a little dry right now due to lung issues. Replete KCL 40 mgEq x2 and recheck labs.  2/17- Per heart failure team, suggested starting Aldactone 12.5 mg daily along with Lasix 20 mEq daily and recheck CXR,  which I was planning on doing. Is ordered Cr down to 1.48 and BUN 29  2/18- Cr down to 1.47 - very slightly down.  2/19- Cr stable/1.50- from 1.46- and Bun stable at 28- stable but will monitor for worsening  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 On ASA, Losartan, digoxin, coreg, Lipitor.   Filed Weights   01/18/20 0424 01/21/20 0500 01/22/20 0500  Weight: 54 kg 46.7 kg 47.6 kg    2/14 wt range 52.5-57kg  2/15- weight up slightly to 54kg, however in florid pulmonary edema- asked heart failure to come by- gave pt a dose of Lasix 40 mg IV and got EKG- cannot access; Cards suggested more Lasix- - will follow plan  2/16-  Pt feeling better; not back to baseline- although SOB better, Cr up to 1.56 and losing K+- will need to replete and monitor Cr closely.  2/17- no weight x2 days  2/18- weight down to 46.7kg- - not back to baseline, but think pt tight/needs nebs- will recommend to take.  2/19- Weight 47.6kg- dropped so fast, not sure if right- willl check with nursing.   12. Thrombocytopenia: Has resolved.  13. Shortness of breath: Encourage IS. Continue  14. Constipation: Resolved. Can DC daily docusate, miralax, and dulcolax. Patient is refusing and moving bowels regularly.   2/6- having loose stools- due to colchicine likely- will d/c Colace and add Fibercon daily  2/7- improved per pt.   2/8: continues to improve.  2/11- looser stools- likely due to IV ABX- will increase Fibercon dose and if doesn't improve, will check CDIff- doesn't smell or sound like C Diff- however not diagnostic- will check    2/16- never had loose stool- so not sent 15. Hypernatremia  2/9- was 147 improved to 144 on 2/13, repeat in a.m. 16. Insomnia  Improved last night, patient quite pleased with his sleep at this time  2/12-increase trazodone to 50 mg QHS   2/16- sleeping well  2/19- sleeping great with current regimen; will con't.  17.  Disposition: Lives alone, but has  supportive family, including daughter, nearby.   2/18- moved d/c date to next Tuesday.      LOS: 14 days A FACE TO FACE  EVALUATION WAS PERFORMED  Maloree Uplinger 01/22/2020, 9:55 AM

## 2020-01-22 NOTE — Progress Notes (Signed)
Physical Therapy Session Note  Patient Details  Name: David Perez MRN: [id number] Date of Birth: 1941-05-25  Today's Date: 01/22/2020 PT Individual Time: 0905-1000 PT Individual Time Calculation (min): 55 min   Short Term Goals: Week 2:  PT Short Term Goal 1 (Week 2): STG=LTG due to ELOS.  Skilled Therapeutic Interventions/Progress Updates:   Pt received supine in bed and agreeable to PT. Supine>sit transfer without assist or cues.    PT assessing O2 saturation with activity following verbal orders from MD.  Seated at rest without O2 98-100%. Gait training with RW, supervision assist x 26f without O2 pt desat to 84%. Requires 1 min rest break to increase >90%. Gait training with 2L/min O2, RW, supervision assist x 329fand desat only to 94%; increased to 100% once seated in WC.   PT performed additional Gait trails with supplemental 2L/min O2 ([id number] without O2 (71ft70f 40ft)34fth O2 Pt desat to only 95% on each bout. Without O2, unable to attain accurate reading while ambulating on 1st bout and SpO2 reading 91% sporadically on 2nd bout. Once at rest, pt O2 readings increased to 99% at rest.   Prolonged rest break following each bout of gait training, as pt reports moderate-severe SOB with and without supplemental O2.   Pt returned to room and performed stand pivot transfer to bed with . Sit>supine completed without assist, and left supine in bed with call bell in reach and all needs met.            Therapy Documentation Precautions:  Precautions Precautions: Sternal, Fall Precaution Comments: Reviewed sternal precautions Restrictions Weight Bearing Restrictions: No    Vital Signs: Therapy Vitals Temp: 97.6 F (36.4 C) Pulse Rate: 66 Resp: 16 BP: 126/62 Patient Position (if appropriate): Lying Oxygen Therapy SpO2: (!) 84 % O2 Device: Room Air Patient Activity (if Appropriate): Ambulating Pain: denies   Therapy/Group: Individual Therapy  AustinLorie Phenix2021, 6:00 PM

## 2020-01-22 NOTE — Progress Notes (Signed)
Occupational Therapy Session Note  Patient Details  Name: Trea Kenworthy MRN: [id number] Date of Birth: [birth year]  Today's Date: 01/22/2020 OT Individual Time: 1100-1200 & 1435-1530 OT Individual Time Calculation (min): 60 min & 55 min   Short Term Goals: Week 2:  OT Short Term Goal 1 (Week 2): STG = LTG  Skilled Therapeutic Interventions/Progress Updates:    am session:  Patient in bed, alert and states that he had a good night sleep last night.  He denies pain.  Daughter is present for therapy session.  TEDS donned with max A.  Bed mobility completed with CS.  Sit to stand and SPT to w/c with CS.  He completed grooming (washing face and shaving with new electric razor) with set up.  Reviewed DME (commode and tub bench) that were delivered to the room and how to position and proper height for home use.  Patient completed sit to stand and SPT to commode with CS.  He completed repetitive sit to stand for endurance activity with CS resting as needed.  Reviewed sternal precautions - patient and daughter demonstrated good carryover and understanding.  Completed LB stretching and AROM activities.  He remained seated in the w.c at close of session seat belt alarm set and call bell in reach.  Pm session:   Patient in bed, finishing breathing tx with respiratory therapy.  He presents with mild anxiety and requires conversation to review progress, cues on how to monitor signs and symptoms of distress or issues (reviewed monitoring of edema in ankles, level of fatigue, balance and ability to complete tasks) as he is concerned about going home.  He requires reminders of tasks completed this morning but is able to recall details of sessions earlier this week.  O2 saturation difficult to read due to cold hands - after hand activity saturation at 99% at start and close of session.  Supine to sitting and SPT to w/c with CS,  Patient completed seated kinetron stepping activity at slow rate.  Ambulation on unit with RW  CS 2x 40 feet.  Returned to bed at close of session with CS, bed alarm set and call bell in reach.      Therapy Documentation Precautions:  Precautions Precautions: Sternal, Fall Precaution Comments: Reviewed sternal precautions Restrictions Weight Bearing Restrictions: No General:   Vital Signs: Therapy Vitals Temp: 98.1 F (36.7 C) Temp Source: Oral Pulse Rate: (!) 52 Resp: 17 BP: (!) 121/54 Patient Position (if appropriate): Lying Oxygen Therapy SpO2: 99 % O2 Device: Room Air   Therapy/Group: Individual Therapy  [name] 01/22/2020, 8:51 AM

## 2020-01-22 NOTE — Progress Notes (Signed)
Social Work Patient ID: Marijean Bravo, male   DOB: 1941-11-16, 79 y.o.   MRN: [id number]  Met with pt and daughter yesterday following team conference.  They are both aware and agreeable with extension of CIR stay due to medical issues and change in targeted d/c date to 2/23.  Loralee Pacas, LCSW-A, to follow case through to d/c.  Bich Mchaney, LCSW

## 2020-01-22 NOTE — Progress Notes (Signed)
Occupational Therapy Session Note  Patient Details  Name: David Perez MRN: [id number] Date of Birth: Sep 10, 1941  Today's Date: 01/22/2020 OT Individual Time: 6122-4001 OT Individual Time Calculation (min): 28 min    Short Term Goals: Week 1:  OT Short Term Goal 1 (Week 1): Pt will perform UB dressing with min A and min cuing to maintain precautions. OT Short Term Goal 1 - Progress (Week 1): Met OT Short Term Goal 2 (Week 1): Pt will perform bathing tasks with min A overall. OT Short Term Goal 2 - Progress (Week 1): Met OT Short Term Goal 3 (Week 1): PT will perform LB dressing with min A overall. OT Short Term Goal 3 - Progress (Week 1): Met  Skilled Therapeutic Interventions/Progress Updates:    1;1. Pt received in bed agreeale to OT and requesting light therex. Pt completes stand pivot transfer with set up of w/c and no AD. Pt completes 10x 30 sec intervals work on lightest load of UBE and 30 seconds off. With VC for diaphramatic breathing throughout activity. Pt and OT discuss energy conservation strategies throughout task. Exited session with pt seated in w/c, call light in reach and all needs met  Therapy Documentation Precautions:  Precautions Precautions: Sternal, Fall Precaution Comments: Reviewed sternal precautions Restrictions Weight Bearing Restrictions: No General:   Vital Signs:  Pain:   ADL:   Vision   Perception    Praxis   Exercises:   Other Treatments:     Therapy/Group: Individual Therapy  Tonny Branch 01/22/2020, 1:58 PM

## 2020-01-23 ENCOUNTER — Inpatient Hospital Stay (HOSPITAL_COMMUNITY): Payer: Medicare Other | Admitting: Occupational Therapy

## 2020-01-23 DIAGNOSIS — D649 Anemia, unspecified: Secondary | ICD-10-CM

## 2020-01-23 DIAGNOSIS — N179 Acute kidney failure, unspecified: Secondary | ICD-10-CM

## 2020-01-23 DIAGNOSIS — I5021 Acute systolic (congestive) heart failure: Secondary | ICD-10-CM

## 2020-01-23 DIAGNOSIS — D72829 Elevated white blood cell count, unspecified: Secondary | ICD-10-CM

## 2020-01-23 DIAGNOSIS — R0602 Shortness of breath: Secondary | ICD-10-CM

## 2020-01-23 DIAGNOSIS — Z951 Presence of aortocoronary bypass graft: Secondary | ICD-10-CM

## 2020-01-23 DIAGNOSIS — D539 Nutritional anemia, unspecified: Secondary | ICD-10-CM | POA: Diagnosis present

## 2020-01-23 LAB — BASIC METABOLIC PANEL
Anion gap: 8 (ref 5–15)
BUN: 31 mg/dL — ABNORMAL HIGH (ref 8–23)
CO2: 22 mmol/L (ref 22–32)
Calcium: 8.6 mg/dL — ABNORMAL LOW (ref 8.9–10.3)
Chloride: 111 mmol/L (ref 98–111)
Creatinine, Ser: 1.48 mg/dL — ABNORMAL HIGH (ref 0.61–1.24)
GFR calc Af Amer: 51 mL/min — ABNORMAL LOW (ref >60)
GFR calc non Af Amer: 44 mL/min — ABNORMAL LOW (ref >60)
Glucose, Bld: 111 mg/dL — ABNORMAL HIGH (ref 70–99)
Potassium: 4.4 mmol/L (ref 3.5–5.1)
Sodium: 141 mmol/L (ref 135–145)

## 2020-01-23 MED ORDER — LIDOCAINE 5 % EX PTCH
1.0000 | MEDICATED_PATCH | CUTANEOUS | Status: DC
  Administered 2020-01-23: 18:00:00 1 via TRANSDERMAL
  Filled 2020-01-23: qty 1

## 2020-01-23 NOTE — Progress Notes (Signed)
Pt inquired as to when he would receive his nebulizer treatment as he thought it was due around 0200.  RT was informed and is administering treatment now. No distress noted.  Crist Fat RN

## 2020-01-23 NOTE — Progress Notes (Signed)
Occupational Therapy Session Note  Patient Details  Name: David Perez MRN: [id number] Date of Birth: 23-Jan-1941  Today's Date: 01/23/2020 OT Individual Time: 0915-1030 OT Individual Time Calculation (min): 75 min    Short Term Goals: Week 2:  OT Short Term Goal 1 (Week 2): STG = LTG  Skilled Therapeutic Interventions/Progress Updates:    Patient in bed, alert and ready for session.  He denies pain and states that he slept well last night.  Supine to sitting with CS.  Sit to stand and ambulation from bed to toilet using RW with CS.  He completed toileting with CS/CGA.  Shower completed sitting on shower bench with CS.  Dressing completed sitting in w/c with CS/set up (with exception of donning TEDS - dependent)  Oral care and grooming tasks w/c level at sink with set up.   Patient completed up/down 4 steps with one rail step over step CS/CGA - 4 times with rest breaks between.  Short distance ambulation x2 with RW CS.  He remained sitting in w/c at close of session, seat belt alarm set and call bell in reach.  He continues to require extra time between tasks due to fatigue but is pleased with activities completed this session.    Therapy Documentation Precautions:  Precautions Precautions: Sternal, Fall Precaution Comments: Reviewed sternal precautions Restrictions Weight Bearing Restrictions: No    Vital Signs: Therapy Vitals Temp: 98.2 F (36.8 C) Pulse Rate: (!) 56 Resp: 17 BP: 114/62 Patient Position (if appropriate): Lying Oxygen Therapy SpO2: 99 % O2 Device: Room Air   Therapy/Group: Individual Therapy  [name] 01/23/2020, 7:30 AM

## 2020-01-23 NOTE — Plan of Care (Signed)
  Problem: RH Ambulation Goal: LTG Patient will ambulate in controlled environment (PT) Description: LTG: Patient will ambulate in a controlled environment, # of feet with assistance (PT). Flowsheets Taken 01/23/2020 1757 by Lorie Phenix, PT LTG: Ambulation distance in controlled environment: 53ft with LRAD Taken 01/09/2020 1909 by Tawana Scale, PT LTG: Pt will ambulate in controlled environ  assist needed:: Supervision/Verbal cueing Note: Down graded due to functional decline and decreased endurance.  Goal: LTG Patient will ambulate in home environment (PT) Description: LTG: Patient will ambulate in home environment, # of feet with assistance (PT). Flowsheets Taken 01/23/2020 1757 by Lorie Phenix, PT LTG: Pt will ambulate in home environ  assist needed:: Supervision/Verbal cueing Taken 01/09/2020 1909 by Tawana Scale, PT LTG: Ambulation distance in home environment: 92ft using LRAD Note: Down graded due to functional decline and decreased endurance.

## 2020-01-23 NOTE — Progress Notes (Signed)
[place] PHYSICAL MEDICINE & REHABILITATION PROGRESS NOTE   Subjective/Complaints: Patient seen sitting up this morning.  He states he slept well overnight.  He notes dyspnea on exertion.  He states he is taking his nebulizers as instructed.  ROS: DOE. Denies CP, SOB, N/V/D  Objective:   No results found. Recent Labs    01/21/20 0627  WBC 13.0*  HGB 10.3*  HCT 31.5*  PLT 281   Recent Labs    01/22/20 0526 01/23/20 0627  NA 143 141  K 4.2 4.4  CL 115* 111  CO2 21* 22  GLUCOSE 101* 111*  BUN 27* 31*  CREATININE 1.50* 1.48*  CALCIUM 8.7* 8.6*    Intake/Output Summary (Last 24 hours) at 01/23/2020 1101 Last data filed at 01/23/2020 0730 Gross per 24 hour  Intake 360 ml  Output 200 ml  Net 160 ml     Physical Exam:  Vital Signs Blood pressure 114/62, pulse (!) 56, temperature 98.2 F (36.8 C), resp. rate 17, height 5\' 8"  (1.727 m), weight 47.9 kg, SpO2 99 %. Constitutional: No distress . Vital signs reviewed.  Frail. HENT: Normocephalic.  Atraumatic. Eyes: EOMI. No discharge. Cardiovascular: No JVD. Respiratory: Normal effort.  No stridor. GI: Non-distended. Skin: Warm and dry.  Intact. Psych: Somewhat anxious Musc: No edema in extremities.  No tenderness in extremities. Neuro:  Alert Freely moving all extremities  Assessment/Plan: 1. 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  Physiatrist is providing close team supervision and 24 hour management of active medical problems listed below.  Physiatrist and rehab team continue to assess barriers to discharge/monitor patient progress toward functional and medical goals  Care Tool:  Bathing    Body parts bathed by patient: Right arm, Left arm, Chest, Abdomen, Front perineal area, Buttocks, Right upper leg, Left upper leg, Right lower leg, Left lower leg, Face   Body parts bathed by helper:  Chest, Abdomen, Right lower leg, Left lower leg     Bathing assist Assist Level: Supervision/Verbal cueing     Upper Body Dressing/Undressing Upper body dressing Upper body dressing/undressing activity did not occur (including orthotics): N/A What is the patient wearing?: Pull over shirt    Upper body assist Assist Level: Supervision/Verbal cueing    Lower Body Dressing/Undressing Lower body dressing      What is the patient wearing?: Pants     Lower body assist Assist for lower body dressing: Moderate Assistance - Patient 50 - 74%     Toileting Toileting    Toileting assist Assist for toileting: Contact Guard/Touching assist     Transfers Chair/bed transfer  Transfers assist     Chair/bed transfer assist level: Contact Guard/Touching assist Chair/bed transfer assistive device: Programmer, multimedia   Ambulation assist      Assist level: Supervision/Verbal cueing Assistive device: Walker-rolling Max distance: 30'   Walk 10 feet activity   Assist     Assist level: Supervision/Verbal cueing Assistive device: Walker-rolling   Walk 50 feet activity   Assist    Assist level: Supervision/Verbal cueing Assistive device: Walker-rolling    Walk 150 feet activity   Assist Walk 150 feet activity did not occur: Safety/medical concerns  Assist level: Supervision/Verbal cueing Assistive device: Walker-rolling    Walk 10 feet on uneven surface  activity   Assist     Assist level: Minimal Assistance - Patient > 75% Assistive device: Walker-rolling  Wheelchair     Assist Will patient use wheelchair at discharge?: No             Wheelchair 50 feet with 2 turns activity    Assist            Wheelchair 150 feet activity     Assist          Blood pressure 114/62, pulse (!) 56, temperature 98.2 F (36.8 C), resp. rate 17, height 5\' 8"  (1.727 m), weight 47.9 kg, SpO2 99 %.  Medical Problem List and  Plan: 1.  Impaired mobility and ADLs secondary to cardiac debility s/p CABGx3.             Completed 2-week course of colchicine for pericardial irritation  Continue CIR  2.  Antithrombotics: -DVT/anticoagulation: Starting Eliquis 2.5 mg BID             -antiplatelet therapy: On ASA 3. Pain Management: Denies pain, not receiving any pain medications.  4. Mood: LCSW to follow for evaluations and support.              -antipsychotic agents:  N/A 5. Neuropsych: This patient is capable of making decisions on his own behalf. 6. Skin/Wound Care: Monitor incisions for healing.  7. Fluids/Electrolytes/Nutrition: Offer supplements between meals to promote wound healing.  8. AFib: Continue to monitor HR and with increased in activity. Continue Amiodarone bid  Rate relatively controlled on 2/20 9. Persistent Leucocytosis/pneumonia:   Thoracentesis performed by IR on 2/11, positive WBCs, no growth  Completed course of cefepime  WBC 13.0 on 2/18, labs ordered for Monday  Con't nebs q6 hours scheduled, if doesn't get better, might benefit from some PO steroids.    Appears stable on 2/20 10.  Acute kidney injury:   Aldactone 12.5 mg daily along with Lasix 20 mEq daily  Creatinine 1.48 on 2/20, labs ordered for Monday  Encourage fluids 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 On ASA, Losartan, digoxin, coreg, Lipitor.   Filed Weights   01/21/20 0500 01/22/20 0500 01/23/20 0500  Weight: 46.7 kg 47.6 kg 47.9 kg   Relatively stable on 2/20 12. Thrombocytopenia: Has resolved.  13. Shortness of breath: Encourage IS. Continue   See #9 14. Constipation: Resolved. Can DC daily docusate, miralax, and dulcolax. Patient is refusing and moving bowels regularly.  15. Hypernatremia: Resolved 16.  Sleep disturbance  Improving  17.  Disposition: Lives alone, but has supportive family, including daughter, nearby.  18.  Anemia  Hemoglobin 10.3 on 2/18, labs ordered for  Monday  Continue to monitor  LOS: 15 days A FACE TO FACE EVALUATION WAS PERFORMED  Avalynne Diver Lorie Phenix 01/23/2020, 11:01 AM

## 2020-01-24 ENCOUNTER — Inpatient Hospital Stay (HOSPITAL_COMMUNITY): Payer: Medicare Other

## 2020-01-24 DIAGNOSIS — I509 Heart failure, unspecified: Secondary | ICD-10-CM | POA: Insufficient documentation

## 2020-01-24 NOTE — Progress Notes (Signed)
Physical Therapy Session Note  Patient Details  Name: David Perez MRN: [id number] Date of Birth: [birth year]  Today's Date: 01/24/2020 PT Individual Time: 1001-1058 and 1500-1554 PT Individual Time Calculation (min): 57 min and 54 min    Short Term Goals: Week 2:  PT Short Term Goal 1 (Week 2): STG=LTG due to ELOS.  Skilled Therapeutic Interventions/Progress Updates:    Session 1: Pt supine in bed upon PT arrival, agreeable to therapy tx and denies pain. Pt on RA throughout session, SpO2>90%, increased work of breathing noted. Pt reports that he did the stairs yesterday, pt reports that he overdid it and is very tired. Reports "I did the stairs once then twice, I should have stopped after that." Pt reports the day after he then felt like he was going to fall when he got up with nursing. Pt very hesitant to do much in therapy, agreeable to go to the gym and "take it easy." Declines walking activity today. Supine>sitting with supervision. Sit>stand with supervision, hands on knees for sternal precautions. Pt performed stand pivot with CGA and transported to the gym. Pt performed stand pivot this session from w/c<>nustep with CGA. Pt used nustep for cardiopulmonary endurance with LEs only x 5 minutes on resistance level 4 initially but then asking to go down to resistance 1, rest breaks throughout, pt reports "I am not going to burn out this time." Pt performed seated exercises this session for strength and endurance, 2 x 10 each: LAQ, seated marches, hip abduction with orange theraband and ball squeezes, pt continues to report "I am not going to burn myself out." Pt propelled w/c x 10 ft using legs with supervision, transported the rest of the way to room. Stand pivot to the recliner with CGA and left with needs in reach and chair alarm set.   Session 2: Pt supine in bed upon PT arrival, agreeable to therapy tx and denies pain. Per MD request (Dr. Posey Pronto), HR monitored throughout the session to assess  change/response during activities: Pt supine at rest- HR 58 bpm, SpO2 98% Supine>sitting with supervision- HR 59 bpm, SpO2 98% Pt ambulated x 20 ft within the room using RW and supervision- HR 60 bpm (checked manually) Pt performed seated alternating marches x 60 sec- HR following activity 62 bpm (checked manually) Pt ambulated x62 ft with HR 58 bpm (checked manually, inconsistent beat noted), increased work of breathing noted.  Pt performed 2 x 5 sit<>stands from w/c with hands on knees for sternal precautions, CGA - HR 60 bpm with activity (checked manually) Pt ambulated x 25 ft with RW and CGA - HR 60 bpm (checked manually).  Pt noted to have possible memory impairments - did not recognize respiratory therapist from the morning that had returned in the afternoon, during both sessions pt would repeat the same stories multiple times, difficulty remembering previous therapies, and decreased safety awareness with turning to sit. Pt left seated in w/c with needs in reach and chair alarm set.     Therapy Documentation Precautions:  Precautions Precautions: Sternal, Fall Precaution Comments: Reviewed sternal precautions Restrictions Weight Bearing Restrictions: No    Therapy/Group: Individual Therapy  Netta Corrigan, PT, DPT, CSRS 01/24/2020, 7:40 AM

## 2020-01-24 NOTE — Progress Notes (Addendum)
[place] PHYSICAL MEDICINE & REHABILITATION PROGRESS NOTE   Subjective/Complaints: Patient seen sitting up in bed this morning.  He states he slept well overnight.  He states he has questions regarding x-rays that were performed yesterday, however performed 4 days ago-patient states he is losing track of time.  He has questions regarding shortness of breath.  No significant benefit with Lidoderm patch.  ROS: + DOE.  Denies CP, N/V/D  Objective:   No results found. No results for input(s): WBC, HGB, HCT, PLT in the last 72 hours. Recent Labs    01/22/20 0526 01/23/20 0627  NA 143 141  K 4.2 4.4  CL 115* 111  CO2 21* 22  GLUCOSE 101* 111*  BUN 27* 31*  CREATININE 1.50* 1.48*  CALCIUM 8.7* 8.6*    Intake/Output Summary (Last 24 hours) at 01/24/2020 0910 Last data filed at 01/24/2020 0730 Gross per 24 hour  Intake 360 ml  Output 250 ml  Net 110 ml     Physical Exam:  Vital Signs Blood pressure (!) 131/58, pulse (!) 53, temperature 98.1 F (36.7 C), resp. rate 17, height 5\' 8"  (1.727 m), weight 47.9 kg, SpO2 99 %. Constitutional: No distress . Vital signs reviewed.  Frail. HENT: Normocephalic.  Atraumatic. Eyes: EOMI. No discharge. Cardiovascular: No JVD. Respiratory: Normal effort.  No stridor. GI: Non-distended. Skin: Warm and dry.  Intact. Psych: Normal mood.  Normal behavior. Musc: No edema in extremities.  No tenderness in extremities. Neuro:  Alert Motor: 5/5 throughout  Assessment/Plan: 1. 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  Physiatrist is providing close team supervision and 24 hour management of active medical problems listed below.  Physiatrist and rehab team continue to assess barriers to discharge/monitor patient progress toward functional and medical goals  Care Tool:  Bathing    Body parts bathed by patient: Right arm,  Left arm, Chest, Abdomen, Front perineal area, Buttocks, Right upper leg, Left upper leg, Right lower leg, Left lower leg, Face   Body parts bathed by helper: Chest, Abdomen, Right lower leg, Left lower leg     Bathing assist Assist Level: Supervision/Verbal cueing     Upper Body Dressing/Undressing Upper body dressing Upper body dressing/undressing activity did not occur (including orthotics): N/A What is the patient wearing?: Pull over shirt    Upper body assist Assist Level: Set up assist    Lower Body Dressing/Undressing Lower body dressing      What is the patient wearing?: Pants     Lower body assist Assist for lower body dressing: Supervision/Verbal cueing     Toileting Toileting    Toileting assist Assist for toileting: Contact Guard/Touching assist     Transfers Chair/bed transfer  Transfers assist     Chair/bed transfer assist level: Contact Guard/Touching assist Chair/bed transfer assistive device: Programmer, multimedia   Ambulation assist      Assist level: Supervision/Verbal cueing Assistive device: Walker-rolling Max distance: 30'   Walk 10 feet activity   Assist     Assist level: Supervision/Verbal cueing Assistive device: Walker-rolling   Walk 50 feet activity   Assist    Assist level: Supervision/Verbal cueing Assistive device: Walker-rolling    Walk 150 feet activity   Assist Walk 150 feet activity did not occur: Safety/medical concerns  Assist level: Supervision/Verbal cueing Assistive device: Walker-rolling    Walk 10 feet on uneven surface  activity  Assist     Assist level: Minimal Assistance - Patient > 75% Assistive device: Aeronautical engineer Will patient use wheelchair at discharge?: No             Wheelchair 50 feet with 2 turns activity    Assist            Wheelchair 150 feet activity     Assist          Blood pressure (!) 131/58, pulse  (!) 53, temperature 98.1 F (36.7 C), resp. rate 17, height 5\' 8"  (1.727 m), weight 47.9 kg, SpO2 99 %.  Medical Problem List and Plan: 1.  Impaired mobility and ADLs secondary to cardiac debility s/p CABGx3.             Completed 2-week course of colchicine for pericardial irritation  Continue CIR  2.  Antithrombotics: -DVT/anticoagulation: Starting Eliquis 2.5 mg BID             -antiplatelet therapy: On ASA 3. Pain Management: Denies pain, not receiving any pain medications.   Lidoderm patch DCed due to lack of benefit. 4. Mood: LCSW to follow for evaluations and support.              -antipsychotic agents:  N/A 5. Neuropsych: This patient is capable of making decisions on his own behalf. 6. Skin/Wound Care: Monitor incisions for healing.  7. Fluids/Electrolytes/Nutrition: Offer supplements between meals to promote wound healing.  8. AFib: Continue to monitor HR and with increased in activity. Continue Amiodarone bid  Mildly bradycardic on 2/21, may consider decreasing dose of amiodarone  Will attempt to reach out to therapies regarding heart rate during therapies  ECG reviewed, showing sinus bradycardia 9. Persistent Leucocytosis/pneumonia:   Thoracentesis performed by IR on 2/11, positive WBCs, no growth  Completed course of cefepime  WBC 13.0 on 2/18, labs ordered for tomorrow  Con't nebs q6 hours scheduled, if doesn't get better, might benefit from some PO steroids.     SOB stable on 2/21 10.  Acute kidney injury:   Aldactone 12.5 mg daily along with Lasix 20 mEq daily  Creatinine 1.48 on 2/20, labs ordered for tomorrow  Encourage fluids 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 On ASA, Losartan, digoxin, coreg, Lipitor.   Filed Weights   01/21/20 0500 01/22/20 0500 01/23/20 0500  Weight: 46.7 kg 47.6 kg 47.9 kg   Stable on 2/20, no repeat weights today 12. Thrombocytopenia: Has resolved.  13. Shortness of breath: Encourage IS.  Continue   See #9 14. Constipation: Resolved. Can DC daily docusate, miralax, and dulcolax. Patient is refusing and moving bowels regularly.  15. Hypernatremia: Resolved 16.  Sleep disturbance  Improving  17.  Disposition: Lives alone, but has supportive family, including daughter, nearby.  18.  Anemia  Hemoglobin 10.3 on 2/18, labs ordered for tomorrow  Continue to monitor  LOS: 16 days A FACE TO FACE EVALUATION WAS PERFORMED  David Perez Lorie Phenix 01/24/2020, 9:10 AM

## 2020-01-25 ENCOUNTER — Inpatient Hospital Stay (HOSPITAL_COMMUNITY): Payer: Medicare Other

## 2020-01-25 ENCOUNTER — Encounter (HOSPITAL_COMMUNITY): Payer: Self-pay | Admitting: Internal Medicine

## 2020-01-25 ENCOUNTER — Inpatient Hospital Stay (HOSPITAL_COMMUNITY): Payer: Medicare Other | Admitting: Physical Therapy

## 2020-01-25 ENCOUNTER — Inpatient Hospital Stay (HOSPITAL_COMMUNITY): Payer: Medicare Other | Admitting: Occupational Therapy

## 2020-01-25 LAB — BASIC METABOLIC PANEL
Anion gap: 9 (ref 5–15)
BUN: 34 mg/dL — ABNORMAL HIGH (ref 8–23)
CO2: 20 mmol/L — ABNORMAL LOW (ref 22–32)
Calcium: 8.6 mg/dL — ABNORMAL LOW (ref 8.9–10.3)
Chloride: 109 mmol/L (ref 98–111)
Creatinine, Ser: 1.44 mg/dL — ABNORMAL HIGH (ref 0.61–1.24)
GFR calc Af Amer: 52 mL/min — ABNORMAL LOW (ref >60)
GFR calc non Af Amer: 45 mL/min — ABNORMAL LOW (ref >60)
Glucose, Bld: 124 mg/dL — ABNORMAL HIGH (ref 70–99)
Potassium: 4.3 mmol/L (ref 3.5–5.1)
Sodium: 138 mmol/L (ref 135–145)

## 2020-01-25 LAB — CBC WITH DIFFERENTIAL/PLATELET
Abs Immature Granulocytes: 0.07 10*3/uL (ref 0.00–0.07)
Basophils Absolute: 0.1 10*3/uL (ref 0.0–0.1)
Basophils Relative: 1 %
Eosinophils Absolute: 0.3 10*3/uL (ref 0.0–0.5)
Eosinophils Relative: 4 %
HCT: 33 % — ABNORMAL LOW (ref 39.0–52.0)
Hemoglobin: 10.5 g/dL — ABNORMAL LOW (ref 13.0–17.0)
Immature Granulocytes: 1 %
Lymphocytes Relative: 16 %
Lymphs Abs: 1.5 10*3/uL (ref 0.7–4.0)
MCH: 33.1 pg (ref 26.0–34.0)
MCHC: 31.8 g/dL (ref 30.0–36.0)
MCV: 104.1 fL — ABNORMAL HIGH (ref 80.0–100.0)
Monocytes Absolute: 1 10*3/uL (ref 0.1–1.0)
Monocytes Relative: 11 %
Neutro Abs: 6.2 10*3/uL (ref 1.7–7.7)
Neutrophils Relative %: 67 %
Platelets: 314 10*3/uL (ref 150–400)
RBC: 3.17 MIL/uL — ABNORMAL LOW (ref 4.22–5.81)
RDW: 19 % — ABNORMAL HIGH (ref 11.5–15.5)
WBC: 9.1 10*3/uL (ref 4.0–10.5)
nRBC: 0.2 % (ref 0.0–0.2)

## 2020-01-25 LAB — BRAIN NATRIURETIC PEPTIDE: B Natriuretic Peptide: 428.1 pg/mL — ABNORMAL HIGH (ref 0.0–100.0)

## 2020-01-25 NOTE — Progress Notes (Signed)
Occupational Therapy Discharge Summary  Patient Details  Name: David Perez MRN: [id number] Date of Birth: 1941/03/04  Today's Date: 01/25/2020 Patient has met 12 of 12 long term goals due to improved activity tolerance, improved balance and ability to compensate for deficits.  Patient to discharge at overall Supervision level.  Patient's care partner is independent to provide the necessary physical assistance at discharge.    Reasons goals not met: na  Recommendation:  Patient will benefit from ongoing skilled OT services in home health setting to continue to advance functional skills in the area of BADL and iADL.  Equipment: commode and tub transfer bench  Reasons for discharge: treatment goals met  Patient/family agrees with progress made and goals achieved: Yes  Daughter has been present during a number of OT sessions during which time DME, supervision and safety with self care have been reviewed.  OT Discharge Precautions/Restrictions  Precautions Precautions: Sternal;Fall Precaution Comments: Reviewed sternal precautions Restrictions Other Position/Activity Restrictions: sternal precautions General   Vital Signs Therapy Vitals Temp: 98.1 F (36.7 C) Pulse Rate: 60 Resp: 18 BP: (!) 118/58 Patient Position (if appropriate): Lying Oxygen Therapy SpO2: 100 % O2 Device: Room Air Pain Pain Assessment Pain Scale: 0-10 Pain Score: 0-No pain ADL ADL Eating: Independent Grooming: Modified independent Where Assessed-Grooming: Sitting at sink Upper Body Bathing: Supervision/safety Where Assessed-Upper Body Bathing: Shower Lower Body Bathing: Supervision/safety Where Assessed-Lower Body Bathing: Shower Upper Body Dressing: Setup Where Assessed-Upper Body Dressing: Chair Lower Body Dressing: Setup Where Assessed-Lower Body Dressing: Chair Toileting: Supervision/safety Where Assessed-Toileting: Glass blower/designer: Close supervision Toilet Transfer Method:  Counselling psychologist: Radiographer, therapeutic: Close supervison Clinical cytogeneticist Method: Optometrist: Facilities manager: Close supervision Social research officer, government Method: Heritage manager: Gaffer Baseline Vision/History: Wears glasses Wears Glasses: Reading only Patient Visual Report: No change from baseline Vision Assessment?: No apparent visual deficits Perception  Perception: Within Functional Limits Praxis Praxis: Intact Cognition Overall Cognitive Status: Within Functional Limits for tasks assessed Arousal/Alertness: Awake/alert Orientation Level: Oriented X4 Awareness: Appears intact Safety/Judgment: Appears intact Sensation Sensation Additional Comments: UB intact Coordination Fine Motor Movements are Fluid and Coordinated: Yes Finger Nose Finger Test: WFLs Motor  Motor Motor - Discharge Observations: continues with fatigue and endurance defict Mobility  Bed Mobility Rolling Right: Independent Rolling Left: Independent Supine to Sit: Supervision/Verbal cueing Transfers Sit to Stand: Supervision/Verbal cueing Stand to Sit: Supervision/Verbal cueing      Balance Static Sitting Balance Static Sitting - Level of Assistance: 7: Independent Dynamic Sitting Balance Dynamic Sitting - Level of Assistance: 6: Modified independent (Device/Increase time) Static Standing Balance Static Standing - Level of Assistance: 5: Stand by assistance Dynamic Standing Balance Dynamic Standing - Level of Assistance: 5: Stand by assistance Extremity/Trunk Assessment RUE Assessment RUE Assessment: Within Functional Limits General Strength Comments: WFL below 90 of sh flexion - no resistance due to sternal precautions LUE Assessment LUE Assessment: Within Functional Limits General Strength Comments: WFL below 90 of sh flexion - no resistance due to sternal  precautions   Bonnita Levan Orlandus Borowski 01/25/2020, 9:01 AM

## 2020-01-25 NOTE — Progress Notes (Signed)
Patient ID: David Perez, male   DOB: [birth year], 79 y.o.   MRN: [id number]     Advanced Heart Failure Rounding Note  PCP-Cardiologist: No primary care provider on file.   Subjective:    1/28: CABG (LIMA-LAD, SVG-LPDA, SVG-OM1), Maze + LA appendage clip, bioprosthetic AVR.   Echo: EF 30%, small posterior pericardial effusion, decreased RV systolic function.   S/P Left Thoracentesis 01/14/20 with 1.4 liters removed.   Asked to see patient again today due to increased dyspnea over the weekend.  By exam, he remains in NSR.  He is also concerned about his HR being too low, the readings that I see are in the 50s-60s.  No BMET today.    Objective:   Weight Range: 47.9 kg Body mass index is 16.06 kg/m.   Vital Signs:   Temp:  [97.5 F (36.4 C)-98.1 F (36.7 C)] 97.5 F (36.4 C) (02/22 1521) Pulse Rate:  [52-60] 59 (02/22 1521) Resp:  [16-18] 16 (02/22 1521) BP: (96-120)/(56-60) 110/59 (02/22 1521) SpO2:  [96 %-100 %] 100 % (02/22 1539) Last BM Date: 01/24/20  Weight change: Filed Weights   01/21/20 0500 01/22/20 0500 01/23/20 0500  Weight: 46.7 kg 47.6 kg 47.9 kg    Intake/Output:   Intake/Output Summary (Last 24 hours) at 01/25/2020 1630 Last data filed at 01/25/2020 0510 Gross per 24 hour  Intake 120 ml  Output 250 ml  Net -130 ml      Physical Exam  General: Thin, NAD Neck: No JVD, no thyromegaly or thyroid nodule.  Lungs: Decreased BS left base.  CV: Nondisplaced PMI.  Heart regular S1/S2, no S3/S4, no murmur.  No peripheral edema.  Abdomen: Soft, nontender, no hepatosplenomegaly, no distention.  Skin: Intact without lesions or rashes.  Neurologic: Alert and oriented x 3.  Psych: Normal affect. Extremities: No clubbing or cyanosis.  HEENT: Normal.    Labs    CBC Recent Labs    01/25/20 0509  WBC 9.1  NEUTROABS 6.2  HGB 10.5*  HCT 33.0*  MCV 104.1*  PLT 326   Basic Metabolic Panel Recent Labs    01/23/20 0627  NA 141  K 4.4  CL 111  CO2 22    GLUCOSE 111*  BUN 31*  CREATININE 1.48*  CALCIUM 8.6*   Liver Function Tests No results for input(s): AST, ALT, ALKPHOS, BILITOT, PROT, ALBUMIN in the last 72 hours. No results for input(s): LIPASE, AMYLASE in the last 72 hours. Cardiac Enzymes No results for input(s): CKTOTAL, CKMB, CKMBINDEX, TROPONINI in the last 72 hours.  BNP: BNP (last 3 results) No results for input(s): BNP in the last 8760 hours.  ProBNP (last 3 results) No results for input(s): PROBNP in the last 8760 hours.   D-Dimer No results for input(s): DDIMER in the last 72 hours. Hemoglobin A1C No results for input(s): HGBA1C in the last 72 hours. Fasting Lipid Panel No results for input(s): CHOL, HDL, LDLCALC, TRIG, CHOLHDL, LDLDIRECT in the last 72 hours. Thyroid Function Tests No results for input(s): TSH, T4TOTAL, T3FREE, THYROIDAB in the last 72 hours.  Invalid input(s): FREET3  Other results:   Imaging    No results found.   Medications:     Scheduled Medications: . apixaban  2.5 mg Oral Q12H  . aspirin  81 mg Oral Daily  . atorvastatin  80 mg Oral q1800  . carvedilol  3.125 mg Oral BID WC  . chlorhexidine  15 mL Mouth/Throat BID  . digoxin  0.0625 mg Oral Daily  .  feeding supplement (ENSURE ENLIVE)  237 mL Oral TID BM  . furosemide  20 mg Oral Daily  . levalbuterol  0.63 mg Nebulization Q6H  . losartan  12.5 mg Oral Daily  . polycarbophil  1,250 mg Oral Daily  . potassium chloride  20 mEq Oral Daily  . spironolactone  12.5 mg Oral Daily  . traZODone  50 mg Oral QHS    Infusions:   PRN Medications: acetaminophen, alum & mag hydroxide-simeth, bisacodyl, diphenhydrAMINE, guaiFENesin-dextromethorphan, Melatonin, oxyCODONE, polyethylene glycol, prochlorperazine **OR** prochlorperazine **OR** prochlorperazine, sodium phosphate   Assessment/Plan   1. CAD: STEMI with very short LM and 99% ostial LAD. PCI thought to risk compromise of ostial large LCx, so decision made to defer PCI  and transfer to [facility] for CABG as patient was CP-free at that point. Now s/p CABG with LIMA-LAD, SVG-OM2, SVG-LPDA.  No chest pain.  - Continue ASA.  - Continue atorvastatin 80 mg daily 2. Chronic systolic CHF: Ischemic cardiomyopathy.  Pre-op echo with EF 35%.  Now s/p CABG-AVR.  Echo post-op with EF 30%, RV dysfunction. Milrinone discontinued.  On exam today, he is not volume overloaded.  - Continue Lasix 20 mg daily. Check BMET and will get BNP.     - Continue spironolactone 12.5 daily.   - Continue Coreg 3.125 mg bid.  - Continue spironolactone 25 daily.  - Continue digoxin 0.0625 daily, check level before am dose.  - Continue losartan 12.5 mg  3. Atrial fibrillation: S/p Maze, LA appendage clip.  Regular on exam.  - I think that we can stop amiodarone at this pont.    - Continue eliquis 2.5 mg twice a day. Lower dose with age and weight.  4. Hyperlipidemia: LDL 187. Goal <70.  - Treat w/ atorvastatin 80 qhs.  - Repeat FLP + HFTs in 6 weeks. If not at goal will need addition of Zetia +/- PCSK9i  5. Thrombocytopenia: Post-op, trending up. Platelets stable.  6. Left pleural effusion: 01/14/20 S/P Left thoracentesis 1.4 liters.  Decreased BS left base on exam.  I am concerned that he has re-developed a significant pleural effusion causing his dyspnea.  - CXR PA/lateral.  May need repeat thoracentesis.  7. AKI: Creatinine stable 1.4 on 2/20.  Check BMET today.  8. Delirium: Resolved.  9. ID: He completed course of cefepime in CIR.  10. Elevated LFTs: ?Shock liver from hypotensive episode immediately post-op. LFTs trended down.  RUQ US unremarkable.  11. Deconditioning.  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  CRP up but not ESR.   - Completed 2 wk course of colchicine.   Length of Stay: 17  Loralie Champagne, MD  01/25/2020, 4:30 PM  Advanced Heart Failure Team Pager [phone] (M-F; 7a - 4p)    Please contact [facility] for night-coverage after hours (4p -7a ) and weekends on [email or web address]

## 2020-01-25 NOTE — Progress Notes (Signed)
Physical Therapy Discharge Summary  Patient Details  Name: David Perez MRN: [id number] Date of Birth: 1941/10/24   Patient has met 4 of 9 long term goals due to improved activity tolerance, improved balance, improved postural control, increased strength, decreased pain and ability to compensate for deficits.  Patient to discharge at an ambulatory level for short home distances and w/c level for community distances Supervision.   Patient's care partner is independent to provide the necessary physical assistance at discharge.  Reasons goals not met: Patient with functional decline secondary to changes in medical status, including B pleural effusions, new onset a-fib, and orthostatic hypotension extending his stay in CIR. Patient currently requires supervision for standing and ambulation due to symptomatic a-fib and orthostatic hypotension and decreased activity tolerance. Patient demonstrates the ability to monitor for signs and symptoms and sit or lie down at onset for safety. Patient to stay with his daughters at d/c to provide 24/7 supervision.   Recommendation:  Patient will benefit from ongoing skilled PT services in home health setting to continue to advance safe functional mobility, address ongoing impairments in balance, activity tolerance, functional mobility, patient/caregiver education, and minimize fall risk.  Equipment: RW and 16"x16' light weight wheelchair  Reasons for discharge: treatment goals met and/or adequate for d/c due to change in medical status and increased family support.  Patient/family agrees with progress made and goals achieved: Yes  PT Discharge Precautions/Restrictions Precautions Precautions: Sternal;Fall Precaution Comments: Reviewed sternal precautions Restrictions Other Position/Activity Restrictions: sternal precautions Vision/Perception  Perception Perception: Within Functional Limits Praxis Praxis: Intact  Cognition Overall Cognitive Status:  Impaired/Different from baseline Arousal/Alertness: Awake/alert Focused Attention: Appears intact Sustained Attention: Appears intact Memory: Impaired Memory Impairment: Decreased short term memory Decreased Short Term Memory: Verbal basic;Functional basic Awareness: Appears intact Problem Solving: Impaired Problem Solving Impairment: Functional basic Safety/Judgment: Appears intact Comments: Noted disorientation of time and decreased recall of new information the past 2 days intermittently. Sensation Sensation Light Touch: Appears Intact Hot/Cold: Not tested Proprioception: Appears Intact Stereognosis: Not tested Coordination Gross Motor Movements are Fluid and Coordinated: No Fine Motor Movements are Fluid and Coordinated: Yes Coordination and Movement Description: decreased activity tolerance to minimal activity Motor  Motor Motor - Discharge Observations: continues with fatigue and endurance defict  Mobility Bed Mobility Bed Mobility: Rolling Right;Rolling Left;Supine to Sit;Sit to Supine Rolling Right: Independent Rolling Left: Independent Supine to Sit: Independent Sit to Supine: Independent Transfers Transfers: Sit to Stand;Stand to Sit;Stand Pivot Transfers Sit to Stand: Supervision/Verbal cueing Stand to Sit: Supervision/Verbal cueing Stand Pivot Transfers: Supervision/Verbal cueing Transfer (Assistive device): Rolling walker Locomotion  Gait Ambulation: Yes Gait Assistance: Supervision/Verbal cueing Gait Distance (Feet): 50 Feet Assistive device: Rolling walker Gait Gait: Yes Gait Pattern: Decreased stride length;Step-through pattern;Decreased hip/knee flexion - right;Decreased hip/knee flexion - left;Decreased dorsiflexion - right;Right foot flat;Left foot flat;Right flexed knee in stance;Left flexed knee in stance;Trunk flexed;Decreased trunk rotation;Narrow base of support Gait velocity: decreased Stairs / Additional Locomotion Stairs: Yes Stairs  Assistance: Contact Guard/Touching assist Stair Management Technique: One rail Left Number of Stairs: 4 Height of Stairs: 6 Ramp: Contact Guard/touching assist(with RW) Curb: Contact Guard/Touching assist(with RW) Product manager Mobility: Yes Wheelchair Assistance: Independent with Camera operator: Both lower extermities Wheelchair Parts Management: Independent Distance: 25'  Trunk/Postural Assessment  Cervical Assessment Cervical Assessment: Exceptions to WFL(forward head) Thoracic Assessment Thoracic Assessment: Exceptions to WFL(rounded shoulders) Lumbar Assessment Lumbar Assessment: Exceptions to WFL(posterior pelvic tilt) Postural Control Postural Control: Deficits on evaluation Postural Limitations: decreased  Balance  Standardized Balance Assessment Standardized Balance Assessment: Berg Balance Test Berg Balance Test Sit to Stand: Able to stand  independently using hands Standing Unsupported: Able to stand 30 seconds unsupported(unable to stand longer 2/2 BLE fatigue & SOB) Sitting with Back Unsupported but Feet Supported on Floor or Stool: Able to sit safely and securely 2 minutes Stand to Sit: Sits safely with minimal use of hands Transfers: Able to transfer safely, minor use of hands Standing Unsupported with Eyes Closed: Able to stand 10 seconds with supervision Standing Ubsupported with Feet Together: Able to place feet together independently and stand for 1 minute with supervision From Standing, Reach Forward with Outstretched Arm: Can reach forward >5 cm safely (2") From Standing Position, Pick up Object from Floor: Able to pick up shoe, needs supervision(pt squats to retreive object vs bending over to retrieve cup) From Standing Position, Turn to Look Behind Over each Shoulder: Turn sideways only but maintains balance Turn 360 Degrees: Needs close supervision or verbal cueing(pt only turns to L with close supervision, not  willing to try turning to right 2/2 fear of becoming dizzy) Standing Unsupported, Alternately Place Feet on Step/Stool: Able to complete 4 steps without aid or supervision(completes 8 steps with close supervision) Standing Unsupported, One Foot in Front: Able to take small step independently and hold 30 seconds Standing on One Leg: Tries to lift leg/unable to hold 3 seconds but remains standing independently Total Score: 36 Static Sitting Balance Static Sitting - Balance Support: Feet supported;No upper extremity supported Static Sitting - Level of Assistance: 7: Independent Dynamic Sitting Balance Dynamic Sitting - Balance Support: Feet supported;No upper extremity supported Dynamic Sitting - Level of Assistance: 6: Modified independent (Device/Increase time) Dynamic Sitting - Balance Activities: Lateral lean/weight shifting;Forward lean/weight shifting;Reaching for objects Static Standing Balance Static Standing - Balance Support: No upper extremity supported;During functional activity Static Standing - Level of Assistance: 5: Stand by assistance Dynamic Standing Balance Dynamic Standing - Balance Support: No upper extremity supported;During functional activity Dynamic Standing - Level of Assistance: 5: Stand by assistance Dynamic Standing - Balance Activities: Lateral lean/weight shifting;Forward lean/weight shifting;Reaching for objects Extremity Assessment  RUE Assessment RUE Assessment: Within Functional Limits General Strength Comments: WFL below 90 of sh flexion - no resistance due to sternal precautions LUE Assessment LUE Assessment: Within Functional Limits General Strength Comments: WFL below 90 of sh flexion - no resistance due to sternal precautions RLE Assessment RLE Assessment: Within Functional Limits Active Range of Motion (AROM) Comments: WFL, limited R knee extension (baseline for patient) General Strength Comments: Grossly 5/5 throughout in sitting LLE  Assessment LLE Assessment: Within Functional Limits Active Range of Motion (AROM) Comments: WFL General Strength Comments: Grossly 5/5 throughout in sitting    Anvay Tennis L Luci Bellucci PT, DPT  01/25/2020, 3:52 PM

## 2020-01-25 NOTE — Progress Notes (Signed)
[place] PHYSICAL MEDICINE & REHABILITATION PROGRESS NOTE   Subjective/Complaints:  David Perez reports getting breathing treatment- somewhat helpful, but not completely with SOB/DOE.  Feels "harming self" when walks because increases his SOB and fatigue, so truly feels that activity right now is "bad".   Feels like sometimes keeping up conversation is hard due to DOE.  Also upset that's having bradycardic/low heart rate.Explained we were hopeful to decrease Amiodarone- was decreased 2/17 to 2/18- will call Cards to help. Also insisting we get second opinion on his SOB/DOE since was walking 300 ft with therapy 8 days ago; now no more than 33ft.     ROS: (+) DOE and SOB; denies CP, N.V.C.D  Objective:   No results found. Recent Labs    01/25/20 0509  WBC 9.1  HGB 10.5*  HCT 33.0*  PLT 314   Recent Labs    01/23/20 0627  NA 141  K 4.4  CL 111  CO2 22  GLUCOSE 111*  BUN 31*  CREATININE 1.48*  CALCIUM 8.6*    Intake/Output Summary (Last 24 hours) at 01/25/2020 0944 Last data filed at 01/25/2020 0510 Gross per 24 hour  Intake 360 ml  Output 250 ml  Net 110 ml     Physical Exam:  Reviewed vitals and labs Vital Signs Blood pressure (!) 118/58, pulse 60, temperature 98.1 F (36.7 C), resp. rate 18, height 5\' 8"  (1.727 m), weight 47.9 kg, SpO2 100 %. Constitutional: David Perez awake, alert, sitting up in bedside chair; wearing breathing mask for xopenex tx; no O2; appears comfortable when talking, NAD HENT: Normocephalic.  Atraumatic. Eyes: conjugate gaze Cardiovascular: slightly bradycardic; regular rhythm Respiratory: decreased at bases; but otherwise adequate air movement; no W/R/R/coarseness GI: soft, NT, ND, (+)BS Skin: Warm and dry.  Intact. Psych: anxious Musc: No edema in extremities.  No tenderness in extremities. Neuro:  Alert Motor: 5/5 throughout  Assessment/Plan: 1. Functional deficits secondary to debility due to CABG x3  which require 3+ hours per day of  interdisciplinary therapy in a comprehensive inpatient rehab setting.  Physiatrist is providing close team supervision and 24 hour management of active medical problems listed below.  Physiatrist and rehab team continue to assess barriers to discharge/monitor patient progress toward functional and medical goals  Care Tool:  Bathing    Body parts bathed by patient: Right arm, Left arm, Chest, Abdomen, Front perineal area, Buttocks, Right upper leg, Left upper leg, Right lower leg, Left lower leg, Face   Body parts bathed by helper: Chest, Abdomen, Right lower leg, Left lower leg     Bathing assist Assist Level: Supervision/Verbal cueing     Upper Body Dressing/Undressing Upper body dressing Upper body dressing/undressing activity did not occur (including orthotics): N/A What is the patient wearing?: Pull over shirt    Upper body assist Assist Level: Set up assist    Lower Body Dressing/Undressing Lower body dressing      What is the patient wearing?: Pants     Lower body assist Assist for lower body dressing: Supervision/Verbal cueing     Toileting Toileting    Toileting assist Assist for toileting: Contact Guard/Touching assist     Transfers Chair/bed transfer  Transfers assist     Chair/bed transfer assist level: Contact Guard/Touching assist Chair/bed transfer assistive device: Programmer, multimedia   Ambulation assist      Assist level: Supervision/Verbal cueing Assistive device: Walker-rolling Max distance: 125 ft   Walk 10 feet activity   Assist  Assist level: Supervision/Verbal cueing Assistive device: Walker-rolling   Walk 50 feet activity   Assist    Assist level: Supervision/Verbal cueing Assistive device: Walker-rolling    Walk 150 feet activity   Assist Walk 150 feet activity did not occur: Safety/medical concerns  Assist level: Supervision/Verbal cueing Assistive device: Walker-rolling    Walk 10 feet on  uneven surface  activity   Assist     Assist level: Minimal Assistance - Patient > 75% Assistive device: Aeronautical engineer Will patient use wheelchair at discharge?: No             Wheelchair 50 feet with 2 turns activity    Assist            Wheelchair 150 feet activity     Assist          Blood pressure (!) 118/58, pulse 60, temperature 98.1 F (36.7 C), resp. rate 18, height 5\' 8"  (1.727 m), weight 47.9 kg, SpO2 100 %.  Medical Problem List and Plan: 1.  Impaired mobility and ADLs secondary to cardiac debility s/p CABGx3.             Completed 2-week course of colchicine for pericardial irritation  Continue CIR  2.  Antithrombotics: -DVT/anticoagulation: Starting Eliquis 2.5 mg BID             -antiplatelet therapy: On ASA 3. Pain Management: Denies pain, not receiving any pain medications.   Lidoderm patch DCed due to lack of benefit. 4. Mood: LCSW to follow for evaluations and support.              -antipsychotic agents:  N/A 5. Neuropsych: This patient is capable of making decisions on his own behalf. 6. Skin/Wound Care: Monitor incisions for healing.  7. Fluids/Electrolytes/Nutrition: Offer supplements between meals to promote wound healing.  8. AFib: Continue to monitor HR and with increased in activity. Continue Amiodarone bid  Mildly bradycardic on 2/21, may consider decreasing dose of amiodarone  Will attempt to reach out to therapies regarding heart rate during therapies  ECG reviewed, showing sinus bradycardia  2/22- called Cards consult to assess bradycardia to see if can reduce amiodarone, etc.  9. Persistent Leucocytosis/pneumonia:   Thoracentesis performed by IR on 2/11, positive WBCs, no growth  Completed course of cefepime  WBC 13.0 on 2/18, labs ordered for tomorrow  Con't nebs q6 hours scheduled, if doesn't get better, might benefit from some PO steroids.     SOB stable on 2/21  2/22- stable SOB/DOE,  but now only walking 30 ft vs 319ft- will consult IM to see if can assist; if bradycardia is not only cause of Sx's.  10.  Acute kidney injury:   Aldactone 12.5 mg daily along with Lasix 20 mEq daily  Creatinine 1.48 on 2/20, labs ordered for tomorrow  2/22- Cr stable at 1.48- con't treatment  Encourage fluids 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 On ASA, Losartan, digoxin, coreg, Lipitor.   Filed Weights   01/21/20 0500 01/22/20 0500 01/23/20 0500  Weight: 46.7 kg 47.6 kg 47.9 kg   Stable on 2/20, no repeat weights today  2/22- Stable at 47.9 kg today 12. Thrombocytopenia: Has resolved.  13. Shortness of breath: Encourage IS. Continue   See #9  2/22- see #9' of note, WBC down to 9.1 finally; and on Xopenex scheduled q6 hours 14. Constipation: Resolved. Can DC daily docusate, miralax, and dulcolax. Patient  is refusing and moving bowels regularly.  15. Hypernatremia: Resolved 16.  Sleep disturbance  Improving  17.  Disposition: Lives alone, but has supportive family, including daughter, nearby.  18.  Anemia  Hemoglobin 10.3 on 2/18, labs ordered for tomorrow  2/22- Hb stable at 10.5- con't to monitor and con't Iron to improve Hb  Continue to monitor 19. Dispo  2/22- was planned ot d/c tomorrow, but David Perez hasn't improved his level of function- now 30 ft Supervision and was 300 ft mod I- asked IM to assist/second opinion- might not d/c tomorrow.    LOS: 17 days A FACE TO FACE EVALUATION WAS PERFORMED  Myli Pae 01/25/2020, 9:44 AM

## 2020-01-25 NOTE — Progress Notes (Signed)
Occupational Therapy Session Note  Patient Details  Name: David Perez MRN: [id number] Date of Birth: 02/17/1941  Today's Date: 01/25/2020 OT Individual Time: 0900-1000 OT Individual Time Calculation (min): 60 min    Skilled Therapeutic Interventions/Progress Updates:    Patient seated in w/c, ready for therapy session.  He completed sponge bath at sink, grooming, and dressing tasks with CS/set up seated.  Reviewed recommendations to have family present during self care, basic HM and mobility due to ongoing endurance deficits.  Reviewed and practiced reach and transport of items in kitchen environment - he was able to access items in the refrigerator and on counter tops with CS but fatigues quickly requiring a seated rest break.  Reviewed energy conservation techniques and provided recommendations for home set up and pre-planning meals/snacks to increase accessibility and maintain as much independence as possible.  Patient is reluctant to have family with him 24/7 but they are available and he agreed to supervision as recommended with plan to reduce as able.    Therapy Documentation Precautions:  Precautions Precautions: Sternal, Fall Precaution Comments: Reviewed sternal precautions Restrictions Weight Bearing Restrictions: No Other Position/Activity Restrictions: sternal precautions General:   Vital Signs: Therapy Vitals Temp: (!) 97.5 F (36.4 C) Pulse Rate: (!) 59 Resp: 16 BP: (!) 110/59 Patient Position (if appropriate): Sitting Oxygen Therapy SpO2: 100 % Pain: Pain Assessment Pain Scale: 0-10 Pain Score: 0-No pain   Therapy/Group: Individual Therapy  [name] 01/25/2020, 3:27 PM

## 2020-01-25 NOTE — Progress Notes (Signed)
Physical Therapy Session Note  Patient Details  Name: David Perez MRN: [id number] Date of Birth: May 25, 1941  Today's Date: 01/25/2020 PT Individual Time: [id number] and 1431-1531 PT Individual Time Calculation (min): 28 min and 60 min  Short Term Goals: Week 2:  PT Short Term Goal 1 (Week 2): STG=LTG due to ELOS.  Skilled Therapeutic Interventions/Progress Updates:  Treatment 1: Pt received in recliner & agreeable to tx, reporting need to use restroom.  Sit<>stand from recliner with supervision, sit<>stand from low toilet with CGA.  Gait in room/bathroom with RW & close supervision with therapist managing bathroom door.  Standing balance without BUE support with close supervision<>CGA 2/2 posterior lean.  Pt manages clothing & changes brief with set up assist. Pt with continent void & BM on toilet, performing peri hygiene independently. Pt stands at sink to put dentures in with close supervision. Pt with decreased safety awareness as pt parked RW in room and ambulated around it to bed. HR = 79 bpm, SPO2 = 100% on room air (per dinamap) while sitting EOB after activity. Gait x 125 ft with RW & close supervision with pt determining distance & reporting "this is much better than yesterday", requiring only 1 standing rest break 2/2 BLE fatigue. After task attempted to assess HR manually but inconsistent beats. HR = 71 bpm, SpO2 = 100% on room air per dinamap. Pt left in w/c with chair alarm donned, call bell in reach.  Pain: constant "hurting", "I guess you could call it sore" in chest, 6-7/10. Pt reports pain is constant & MD's are aware of it & attribute it to healing.    Treatment 2: Pt received in recliner & agreeable to tx. Pt reports he feels he over did it this morning but agreeable to tx. Educated pt on recommendation of supervision at d/c. Pt asking about cardiologist coming by with therapist educating him they will come see him when they have time available - they do not make set appointments.  Pt agreeable to performing Berg Balance Test & scores 36/56; educated pt on interpretation of score & current fall risk. Patient demonstrates increased fall risk as noted by score of 36/56 on Berg Balance Scale.  (<36= high risk for falls, close to 100%; 37-45 significant >80%; 46-51 moderate >50%; 52-55 lower >25%). Pt requires frequent seated rest breaks between each activity 2/2 fatigue/SOB. During sit>stand transfers pt with frequent LOB back onto recliner seat 2/2 posterior LOB; pt attempts to use momentum to assist with sit>stand transfer. Pt stating "I hope I don't pay for this later even though I'm not overextending myself right now". Pt continues to voice frustration re: voice quality, steady HR that doesn't increase with activity, & fatigue with therapist providing therapeutic listening & encouragement. Pt left in recliner with chair alarm donned, call bell & all needs in reach.  Pt on room air during session, SpO2 intermittently checked with dinamap & >90%; educated pt on pursed lip breathing to help alleviate feeling SOB. HR intermittently checked with dinamap & ranged 59-66 bpm. (When HR checked manually it was 62 bpm with inconsistent beats & dinamap read 62 bpm at the same time.)  Pain: "I don't know whether to call it pain or not, it's something that's with me all of the time." re: soreness in chest.    Therapy Documentation Precautions:  Precautions Precautions: Sternal, Fall Precaution Comments: Reviewed sternal precautions Restrictions Weight Bearing Restrictions: No Other Position/Activity Restrictions: sternal precautions    Balance: Standardized Balance Assessment Standardized Balance Assessment: Merrilee Jansky  Balance Test Berg Balance Test Sit to Stand: Able to stand  independently using hands Standing Unsupported: Able to stand 30 seconds unsupported(unable to stand longer 2/2 BLE fatigue & SOB) Sitting with Back Unsupported but Feet Supported on Floor or Stool: Able to sit  safely and securely 2 minutes Stand to Sit: Sits safely with minimal use of hands Transfers: Able to transfer safely, minor use of hands Standing Unsupported with Eyes Closed: Able to stand 10 seconds with supervision Standing Ubsupported with Feet Together: Able to place feet together independently and stand for 1 minute with supervision From Standing, Reach Forward with Outstretched Arm: Can reach forward >5 cm safely (2") From Standing Position, Pick up Object from Floor: Able to pick up shoe, needs supervision(pt squats to retreive object vs bending over to retrieve cup) From Standing Position, Turn to Look Behind Over each Shoulder: Turn sideways only but maintains balance Turn 360 Degrees: Needs close supervision or verbal cueing(pt only turns to L with close supervision, not willing to try turning to right 2/2 fear of becoming dizzy) Standing Unsupported, Alternately Place Feet on Step/Stool: Able to complete 4 steps without aid or supervision(completes 8 steps with close supervision) Standing Unsupported, One Foot in Front: Able to take small step independently and hold 30 seconds Standing on One Leg: Tries to lift leg/unable to hold 3 seconds but remains standing independently Total Score: 36   Berg Balance Test 01/17/20 = 43/56    Therapy/Group: Individual Therapy  Waunita Schooner 01/25/2020, 3:40 PM

## 2020-01-25 NOTE — Progress Notes (Signed)
Physical Therapy Session Note  Patient Details  Name: David Perez MRN: [id number] Date of Birth: [birth year]  Today's Date: 01/25/2020 PT Individual Time: 1055-1225 PT Individual Time Calculation (min): 90 min   Short Term Goals: Week 2:  PT Short Term Goal 1 (Week 2): STG=LTG due to ELOS.  Skilled Therapeutic Interventions/Progress Updates:     Patient in bed upon PT arrival. Patient alert and agreeable to PT session. Patient denied pain during session, reported feeling better this morning with change in medications, stating "I feel less SOB."  Therapeutic Activity: Bed Mobility: Patient performed rolling R/L and supine to/from sit independently in a flat hospital bed without use of bed rails and in the ADL bed.  Transfers: Patient performed sit to/from stand from a low hospital bed, w/c, standard chair without use of arm rests, toilet, ADL bed, and ADL recliner with supervision-mod I, required supervision for safety due to SOB and orthostatic hypotension x1. Patient performed all transfers with hands on thighs without cueing to maintain sternal precautions.  Patient performed a simulated small SUV height car transfer with supervision for safety using RW. Provided cues for safe technique.  Gait Training:  Patient ambulated 10-15 feet x4 over flat level tile and carpeted flooring using RW with supervision for safety due to SOB and increased fatigue with ambulation. Ambulated with decreased gait speed, decreased step length and height, increased B hip and knee flexion in stance, and forward trunk lean. Provided verbal cues for paced breathing to manage SOB. Patient ascended/descended 2 steps using L rail to simulate home set-up with CGA. Performed reciprocal gait pattern throughout. He did have a minor LOB on the last step while descending due to reaching for the RW and letting go of the rail as he took his last step. He was able to self correct with CGA. Educated patient on stopping at the last  step to grab the RW before stepping down, patient stated understanding, however, declined a second attempt due to fatigue. Also educated patient on caregiver placement during stairs for guarding, having the RW at the top or bottom of the stairs prior to ascending/descening, and having a chair at the top of the stairs on the porch to sit prior to descending and after ascending the stairs. Patient stated understanding, reinforced education with patient's daughter upon her arrival at end of session.   Wheelchair Mobility:  Patient propelled wheelchair 25 feet with supervision-mod I using B LEs to maintain sternal precautions. Provided verbal cues for reciprocal stepping technique with LEs initially due to this being a novel task. Patient received a w/c for upcoming d/c last minute due to increased fatigue and activity tolerance with activity for increased independence and safety with community mobility. Educated patient and his daughter on management of w/c parts, use of breaks, and placing the w/c into/out of the car for transport. Patient and daughter receptive and demonstrated correct technique with all w/c management.    Patient in recliner with his daughter in the room at end of session with breaks locked and all needs within reach. Educated them on fall risk/prevention, home modifications to prevent falls, and activation of emergency services in the event of a fall during session. Also discussed patients vitals and fatigue with minimal activity and concerns of patient being alone at home. Recommended that the patient have supervision for the first week upon d/c for safety, patient and his daughter in agreement.   Patient continues to be SOB with minimal activity. His HR was between 50-56 at  rest and with activity, both manual and automatic readings. His O2 sats were difficult to obtain, however, read at 96-100% on RA within 1-2 min after activity. He did report feeling weak in the knees while ambulating,  took a sitting and standing BP and it dropped from 126/69 in sitting to 78/49 in standing. He stated at end of session that he feels like he did before he called the ambulance prior to admission. The rehab medical team, Dr. Dagoberto Ligas, [place] and Linna Hoff, [place], have been notified of patient's vitals and decreased activity tolerance. His daughters are willing to be with him 24/7 for a few days if they need to, but the patient would prefer to be independent and he is concerned about how he is feeling.     Therapy Documentation Precautions:  Precautions Precautions: Sternal, Fall Precaution Comments: Reviewed sternal precautions Restrictions Weight Bearing Restrictions: No Other Position/Activity Restrictions: sternal precautions    Therapy/Group: Individual Therapy  David Perez L Kam Rahimi PT, DPT  01/25/2020, 2:34 PM

## 2020-01-25 NOTE — Consult Note (Signed)
Medical Consultation   David Perez  [id number]  DOB: 04-15-1941  DOA: 01/08/2020  PCP: Patient, No Pcp Per - had not seen a doctor in at least 22 years prior to admission  Outpatient Specialists: None   Requesting physician: [facility] - rehab  Reason for consultation: CABG - doing great last Sunday.  Monday, with pulmonary edema and not able to walk due to SOB.  Had effusion and drained.  Cards gave Lasix and he was clinically feeling better.  He continues to complain of DOE, bradycardia (decreased Amio, still in 21s).  He "feels like he's hurting himself."  Only walking 30 feet now.  Scheduled Xopenex with some improvement.  History of Present Illness: David Perez is an 79 y.o. male with no prior known PMH who presented to [facility] on 1/28 with STEMI.   He was taken to their cath lab emergently by Dr. Lujean Amel.  He was found to have left main and ostial disease; intra-aortic balloon pump and triple lumen were placed; he went into afib during the case; and he was started on Amio and Levophed as well as Heparin.  He was transferred to [facility] for emergent CABG which took place on 1/28.  He progressed well and was transferred to CIR on 2/1.  He did ok at first, "then it dipped, then ok, then it dipped."  He thinks the dip was related to the medication.  He is currently able to participate in his therapy but it is limited by his breathing.  Today, his breathing is not bad. Yesterday, he couldn't eat, conversational dyspnea, unable to do anything.  He thinks it comes on so quickly that it has to be related to medication.  He does have pleuritic soreness but no actual pain.  No significant cough.  No fever.  LE edema has resolved.  He is not SOB when not moving around.  Today was much better, until yesterday he was quite SOB with exertion.    Review of Systems:  ROS As per HPI otherwise 10 point review of systems negative.    Past Medical History: Past Medical History:   Diagnosis Date  . 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 Hepatitis B     Past Surgical History: Past Surgical History:  Procedure Laterality Date  . AORTIC VALVE REPLACEMENT N/A 12/31/2019   Procedure: AORTIC VALVE REPLACEMENT (AVR);  Surgeon: Wonda Olds, MD;  Location: [place];  Service: Open Heart Surgery;  Laterality: N/A;  . CLIPPING OF ATRIAL APPENDAGE Left 12/31/2019   Procedure: Clipping Of Atrial Appendage;  Surgeon: Wonda Olds, MD;  Location: [place];  Service: Open Heart Surgery;  Laterality: Left;  . CORONARY ARTERY BYPASS GRAFT N/A 12/31/2019   Procedure: CORONARY ARTERY BYPASS GRAFTING (CABG) TIMES THREE USING LEFT INTERNAL MAMMARY ARTERY AND LEFT GREATER SAPHENOUS LEG VEIN HARVESTED ENDOSCOPICALLY;  Surgeon: Wonda Olds, MD;  Location: [place];  Service: Open Heart Surgery;  Laterality: N/A;  POSSIBLE BILATERAL IMA  . CORONARY/GRAFT ACUTE MI REVASCULARIZATION N/A 12/31/2019   Procedure: Coronary/Graft Acute MI Revascularization;  Surgeon: Yolonda Kida, MD;  Location: [place] CV LAB;  Service: Cardiovascular;  Laterality: N/A;  . IR THORACENTESIS ASP PLEURAL SPACE W/IMG GUIDE  01/14/2020  . LEFT HEART CATH AND CORONARY ANGIOGRAPHY N/A 12/31/2019   Procedure: LEFT HEART CATH AND CORONARY ANGIOGRAPHY;  Surgeon: Yolonda Kida, MD;  Location: [place]  CV LAB;  Service: Cardiovascular;  Laterality: N/A;  . MAZE N/A 12/31/2019   Procedure: MAZE;  Surgeon: Wonda Olds, MD;  Location: [place];  Service: Open Heart Surgery;  Laterality: N/A;  . TEE WITHOUT CARDIOVERSION N/A 12/31/2019   Procedure: TRANSESOPHAGEAL ECHOCARDIOGRAM (TEE);  Surgeon: Wonda Olds, MD;  Location: [place];  Service: Open Heart Surgery;  Laterality: N/A;     Allergies:  No Known Allergies   Social History:  reports that he quit smoking about 25 years ago. He has a 20.00 pack-year smoking history. He has never used smokeless tobacco. He reports previous  alcohol use. He reports previous drug use.   Family History: History reviewed. No pertinent family history.    Physical Exam: Vitals:   01/25/20 0523 01/25/20 0747 01/25/20 1521 01/25/20 1539  BP: (!) 120/56 (!) 118/58 (!) 110/59   Pulse: (!) 56 60 (!) 59   Resp:   16   Temp:   (!) 97.5 F (36.4 C)   TempSrc:      SpO2:   100% 100%  Weight:      Height:        Constitutional: Alert and awake, oriented x3, not in any acute distress.   He appears frail and cachectic.  Mildly hoarse, breathy with conversation. Eyes:  EOMI, irises appear normal, anicteric sclera,  ENMT: external ears and nose appear normal, normal hearing, Lips appear normal Neck: neck appears normal, no masses, normal ROM, no thyromegaly, no JVD  CVS: S1-S2 clear, mild bradycardia, no rubs or gallops, no LE edema, normal pedal pulses  Respiratory:  clear to auscultation bilaterally, no wheezing, rales or rhonchi. Respiratory effort normal. No accessory muscle use.  Abdomen: soft nontender, nondistended, normal bowel sounds Musculoskeletal: : no cyanosis, clubbing or edema noted bilaterally Neuro: Cranial nerves II-XII intact, strength, sensation, reflexes Psych: judgement and insight appear normal, stable mood and affect, mental status Skin: no rashes or lesions or ulcers, no induration or nodules    Data reviewed:  I have personally reviewed the recent labs and imaging studies  Pertinent Labs:   Stable BMP on 2/20 and today Stable CBC on 2/22 CXR on 2/17 with COPD, cardiomegaly, small L pleural effusion with L base atelectasis   Inpatient Medications:   Scheduled Meds: . apixaban  2.5 mg Oral Q12H  . aspirin  81 mg Oral Daily  . atorvastatin  80 mg Oral q1800  . carvedilol  3.125 mg Oral BID WC  . chlorhexidine  15 mL Mouth/Throat BID  . digoxin  0.0625 mg Oral Daily  . feeding supplement (ENSURE ENLIVE)  237 mL Oral TID BM  . furosemide  20 mg Oral Daily  . levalbuterol  0.63 mg Nebulization Q6H   . losartan  12.5 mg Oral Daily  . polycarbophil  1,250 mg Oral Daily  . potassium chloride  20 mEq Oral Daily  . spironolactone  12.5 mg Oral Daily  . traZODone  50 mg Oral QHS   Continuous Infusions:   Radiological Exams on Admission: No results found.  Impression/Recommendations Principal Problem:   Debility Active Problems:   S/P CABG x 3   Acute on chronic systolic CHF (congestive heart failure) (HCC)   SOB (shortness of breath)   -Patient without known PMH since he had not seen a physician in 20+ years who presented with STEMI -He had CABG x 3 -He has done well and transitioned to CIR -While there, he has had ups and downs with regards to his  ability to participate -Previously, CXR showed mild volume overload -Additionally, he has had persistent bradycardia -His DOE at this time is waxing and waning - likely associated with bradycardia, CHF, and debility -He also had a significant smoking history remotely and so he may have underlying COPD -Cardiology is following and I do not see anything additional to add at this time -TRH will sign off at this time     Thank you for this consultation.      Time Spent: 50 minutes  Karmen Bongo M.D. [facility] 01/25/2020, 6:31 PM

## 2020-01-26 ENCOUNTER — Inpatient Hospital Stay (HOSPITAL_COMMUNITY): Payer: Medicare Other | Admitting: Occupational Therapy

## 2020-01-26 ENCOUNTER — Inpatient Hospital Stay (HOSPITAL_COMMUNITY): Payer: Medicare Other

## 2020-01-26 LAB — BASIC METABOLIC PANEL
Anion gap: 8 (ref 5–15)
BUN: 29 mg/dL — ABNORMAL HIGH (ref 8–23)
CO2: 22 mmol/L (ref 22–32)
Calcium: 8.8 mg/dL — ABNORMAL LOW (ref 8.9–10.3)
Chloride: 111 mmol/L (ref 98–111)
Creatinine, Ser: 1.42 mg/dL — ABNORMAL HIGH (ref 0.61–1.24)
GFR calc Af Amer: 53 mL/min — ABNORMAL LOW (ref >60)
GFR calc non Af Amer: 46 mL/min — ABNORMAL LOW (ref >60)
Glucose, Bld: 98 mg/dL (ref 70–99)
Potassium: 4.2 mmol/L (ref 3.5–5.1)
Sodium: 141 mmol/L (ref 135–145)

## 2020-01-26 LAB — DIGOXIN LEVEL: Digoxin Level: 0.7 ng/mL — ABNORMAL LOW (ref 0.8–2.0)

## 2020-01-26 MED ORDER — FUROSEMIDE 20 MG PO TABS
20.0000 mg | ORAL_TABLET | Freq: Every day | ORAL | Status: DC
  Administered 2020-01-27 – 2020-01-29 (×3): 20 mg via ORAL
  Filled 2020-01-26 (×3): qty 1

## 2020-01-26 MED ORDER — LEVALBUTEROL HCL 0.63 MG/3ML IN NEBU
0.6300 mg | INHALATION_SOLUTION | Freq: Three times a day (TID) | RESPIRATORY_TRACT | Status: DC
  Administered 2020-01-26 – 2020-01-28 (×8): 0.63 mg via RESPIRATORY_TRACT
  Filled 2020-01-26 (×10): qty 3

## 2020-01-26 MED ORDER — LEVALBUTEROL HCL 0.63 MG/3ML IN NEBU
0.6300 mg | INHALATION_SOLUTION | Freq: Four times a day (QID) | RESPIRATORY_TRACT | Status: DC | PRN
  Filled 2020-01-26: qty 3

## 2020-01-26 MED ORDER — FUROSEMIDE 40 MG PO TABS: 40.0000 mg | ORAL_TABLET | Freq: Every day | ORAL | Status: DC

## 2020-01-26 MED ORDER — FUROSEMIDE 20 MG PO TABS
20.0000 mg | ORAL_TABLET | Freq: Once | ORAL | Status: AC
  Administered 2020-01-26: 14:00:00 20 mg via ORAL
  Filled 2020-01-26: qty 1

## 2020-01-26 NOTE — Progress Notes (Signed)
Physical Therapy Note  Patient Details  Name: David Perez MRN: [id number] Date of Birth: May 12, 1941 Today's Date: 01/26/2020    Patient orthostatic during therapy session at 1430. Dr. Dagoberto Ligas, MD notified and discussed POC.   Orthostatic Vitals: Sitting: BP 130/63 HR 57 Standing: BP 79/42 HR 86 with reported SOB and urgency to sit, denied feeling light headed or dizzy Recovered to 109/64 in supine with LEs elevated   Demtrius Rounds L Lenox Ladouceur PT, DPT  01/26/2020, 4:02 PM

## 2020-01-26 NOTE — Progress Notes (Signed)
Occupational Therapy Session Note  Patient Details  Name: David Perez MRN: [id number] Date of Birth: [birth year]  Today's Date: 01/26/2020 OT Individual Time: 1015-1100 OT Individual Time Calculation (min): 45 min    Short Term Goals: Week 2:  OT Short Term Goal 1 (Week 2): STG = LTG  Skilled Therapeutic Interventions/Progress Updates:    Patient seated in recliner, states that he feels okay and would like to take a shower.  Daughter present for therapy session.  Sit to stand and ambulation w/ RW to bathroom with CS, one mild LOB (able to self correct)  Patient with episode of "light headedness"  Sat on commode and vitals checked - HR 72, BP 121/72.  Patient noted feeling better upon sitting.  Completed toileting with CS.  Transfer to shower bench with CS, shower completed seated with CS.  Dressing completed seated on w/c with set up/CS (exception of TEDS - donned dependently).  Oral care and grooming tasks completed w/c level at sink mod I.  Reviewed supervision and home set up recommendations with daughter who demonstrates good understanding and agrees with supervision plan.  Patient remained seated in w/c with call bell and tray table in reach.    Therapy Documentation Precautions:  Precautions Precautions: Sternal, Fall Precaution Comments: Reviewed sternal precautions Restrictions Weight Bearing Restrictions: No Other Position/Activity Restrictions: sternal precautions General:   Vital Signs: Therapy Vitals Pulse Rate: 68 Pain: Pain Assessment Pain Scale: 0-10 Pain Score: 0-No pain   Therapy/Group: Individual Therapy  [name] 01/26/2020, 12:21 PM

## 2020-01-26 NOTE — Progress Notes (Addendum)
Physical Therapy Weekly Progress Note  Patient Details  Name: David Perez MRN: [id number] Date of Birth: August 21, 1941  Beginning of progress report period: January 17, 2020 End of progress report period: January 26, 2020  Today's Date: 01/26/2020 PT Individual Time: 1430-1530 PT Individual Time Calculation (min): 60 min   Patient continues to have increased SOB and fatigue and presents with orthostatic hypotension limiting progress to long term goals and prolonging d/c home due to safety concerns.  Patient currently requires supervision for all mobility with gait limited to 15-20 feet due to SOB and fatigue, decreased from >300 feet last week. He also requires CGA for steadying assist for 2-4 steps using a L rail. Patient continues to express that he would like to go home alone with intermittent assist from his daughters despite encouragement to have supervision at home. Will continue discussion with the patient and his family about supervision assist available at home. Goals have been downgraded to supervision for ambulation due to SOB, fatigue, and OH.   Patient continues to demonstrate the following deficits muscle weakness, decreased cardiorespiratoy endurance and decreased standing balance, decreased postural control and decreased balance strategies and therefore will continue to benefit from skilled PT intervention to increase functional independence with mobility.  Patient progressing toward long term goals..  Plan of care revisions: Extended LOS and downgraded ambulation goals to supervision due to decline in functiona secondary to SOB, fatigue, and OH with moblity.  PT Short Term Goals Week 2:  PT Short Term Goal 1 (Week 2): STG=LTG due to ELOS. Week 3:  PT Short Term Goal 1 (Week 3): STG=LTG due to ELOS.  Skilled Therapeutic Interventions/Progress Updates:     Patient in w/c in room finishing a breathing treatment upon PT arrival. Patient alert and agreeable to PT session. Patient  denied pain during session. He reported 1 episode of feeling light-headed earlier today while ambulating in the room with staff and SOB with eating and talking. He has written down these events with times to show to staff to conserve energy. Focused session on assessment of vitals with changes in position and with mobility. He ambulated to/from the bathroom with close supervision using a RW with mild SOB. He was continent of bowl and bladder on the toilet and perform peri-care and LB dressing with supervision. He then transferred to supine in the bed independently from sitting and rested x4 min prior to beginning assessment of orthostatic vital signs.  Orthostatic Vitals: Supine: BP 139/62, HR 62, SPO2 100% on RA, asymptomatic Sitting: BP 118/61, HR 56, SPO2 100% on RA, asymptomatic Sitting x2 min: BP 130/63, HR 57, SPO2 99% on RA, asymptomatic Standing: BP 79/42, HR 86, SPO2 100% on RA, symptomatic reporting SOB and urgency to sit  Standing x2 min: Patient unable due to drop in BP in standing and SOB  Therapeutic Activity: Bed Mobility: Patient performed supine to/from sit independently in a flat bed without use of bed rails or UEs to maintain sternal precautions.  Transfers: Patient performed sit to/from stand with hands on thighs to maintain sternal precautions with close supervision for safety using RW.   Educated patient on orthostatic hypotension, signs and symptoms and critical BP values. Discussed safety at home with [facility] and monitoring signs and symptoms, including SOB and urgency to sit, as this was the patient's presentation, and using those signs and symptoms to indicated that he should sit or lie down as soon as possible for safety. Also discussed having 24/7 supervision initially upon d/c. Patient expressed  frustration that the rehab team continues to suggest that his daughter's assist him at home, stated that it is too much of a burden on them and that he wishes to go home able to be  independent. He does agree that he is not safe to be home alone at this time, however, continues to express his desire to go home when it is safe for him to be home independently. Provided education on the medical team's plan to continue monitoring his symptoms during therapies. Patient stated understanding.  Patient in bed with B LE elevated at end of session with breaks locked, bed alarm set, and all needs within reach. Vitals: BP 109/64, HR 57, SPO2 98% on RA, asymptomatic. MD and RN notified of orthostatic vitals following session.    Therapy Documentation Precautions:  Precautions Precautions: Sternal, Fall Precaution Comments: Reviewed sternal precautions Restrictions Weight Bearing Restrictions: No Other Position/Activity Restrictions: sternal precautions Vital Signs: Therapy Vitals Temp: (!) 97.5 F (36.4 C) Temp Source: Oral Pulse Rate: (!) 55 Resp: 18 BP: 102/63 Patient Position (if appropriate): Sitting Oxygen Therapy SpO2: 100 % O2 Device: Room Air   Therapy/Group: Individual Therapy  Raekwon Winkowski L Javonnie Illescas PT, DPT  01/26/2020, 4:09 PM

## 2020-01-26 NOTE — Progress Notes (Signed)
[facility] PHYSICAL MEDICINE & REHABILITATION PROGRESS NOTE   Subjective/Complaints:  Pt reports no significant change in Sx's so far- still feels fatigued and SOB/DOE. Denies COP still  Knows we've stopped Amiodarone- keeps asking about bradycardia And plan- explained Amio is cause.    ROS:(+)SOB and DOE, denies CP, N/V/C/D  Objective:   DG Chest 2 View  Result Date: 01/25/2020 CLINICAL DATA:  CHF. 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 Improving left pleural effusion from prior exams. Small right pleural effusion is not significantly changed. Chronic hyperinflation. No pulmonary edema or new airspace disease. No pneumothorax. Exaggerated thoracic kyphosis with multilevel degenerative change in the spine. IMPRESSION: Improving left pleural effusion. Stable small right pleural effusion. Electronically Signed   By: Keith Rake M.D.   On: 01/25/2020 19:29   Recent Labs    01/25/20 0509  WBC 9.1  HGB 10.5*  HCT 33.0*  PLT 314   Recent Labs    01/25/20 1649 01/26/20 0532  NA 138 141  K 4.3 4.2  CL 109 111  CO2 20* 22  GLUCOSE 124* 98  BUN 34* 29*  CREATININE 1.44* 1.42*  CALCIUM 8.6* 8.8*    Intake/Output Summary (Last 24 hours) at 01/26/2020 [id number] Last data filed at 01/26/2020 0456 Gross per 24 hour  Intake 480 ml  Output 525 ml  Net -45 ml     Physical Exam:  Reviewed vitals and today's labs  Vital Signs Blood pressure (!) 114/59, pulse 68, temperature 98.2 F (36.8 C), resp. rate 18, height 5\' 8"  (1.727 m), weight 48.8 kg, SpO2 100 %. Constitutional: pt awake, alert, sitting in bedside chair; finishing breakfast, NAD HENT: Normocephalic.  Atraumatic. Eyes: conjugate gaze still Cardiovascular: still slightly bradycardic today; regular rhythm, no Afib Respiratory: decreased more L>R bases; good air movement otherwise but taking more shallow breaths;  GI: cachetic,  but soft, NT, ND, (+)BS hypoactive Skin: Warm and dry.  Intact. Psych: still anxious Musc: No edema in extremities.  No tenderness in extremities. Neuro:  Alert Motor: 5/5 throughout  Assessment/Plan: 1. 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  Physiatrist is providing close team supervision and 24 hour management of active medical problems listed below.  Physiatrist and rehab team continue to assess barriers to discharge/monitor patient progress toward functional and medical goals  Care Tool:  Bathing    Body parts bathed by patient: Right arm, Left arm, Chest, Abdomen, Front perineal area, Buttocks, Right upper leg, Left upper leg, Right lower leg, Left lower leg, Face   Body parts bathed by helper: Chest, Abdomen, Right lower leg, Left lower leg     Bathing assist Assist Level: Supervision/Verbal cueing     Upper Body Dressing/Undressing Upper body dressing Upper body dressing/undressing activity did not occur (including orthotics): N/A What is the patient wearing?: Pull over shirt    Upper body assist Assist Level: Set up assist    Lower Body Dressing/Undressing Lower body dressing      What is the patient wearing?: Pants     Lower body assist Assist for lower body dressing: Supervision/Verbal cueing     Toileting Toileting    Toileting assist Assist for toileting: Supervision/Verbal cueing     Transfers Chair/bed transfer  Transfers assist     Chair/bed transfer assist level: Supervision/Verbal cueing Chair/bed transfer assistive device: Gilford Rile  Locomotion Ambulation   Ambulation assist      Assist level: Supervision/Verbal cueing Assistive device: Walker-rolling Max distance: 50'   Walk 10 feet activity   Assist     Assist level: Supervision/Verbal cueing Assistive device: Walker-rolling   Walk 50 feet  activity   Assist    Assist level: Supervision/Verbal cueing Assistive device: Walker-rolling    Walk 150 feet activity   Assist Walk 150 feet activity did not occur: Safety/medical concerns(decreased activity tolerance, SOB)  Assist level: Supervision/Verbal cueing Assistive device: Walker-rolling    Walk 10 feet on uneven surface  activity   Assist     Assist level: Contact Guard/Touching assist Assistive device: Aeronautical engineer Will patient use wheelchair at discharge?: Yes Type of Wheelchair: Manual    Wheelchair assist level: Independent Max wheelchair distance: 73'    Wheelchair 50 feet with 2 turns activity    Assist    Wheelchair 50 feet with 2 turns activity did not occur: Safety/medical concerns(LE only due to sternal precautions, decreased activity tolerance)       Wheelchair 150 feet activity     Assist  Wheelchair 150 feet activity did not occur: Safety/medical concerns(LE only due to sternal precautions, decreased activity tolerance)       Blood pressure (!) 114/59, pulse 68, temperature 98.2 F (36.8 C), resp. rate 18, height 5\' 8"  (1.727 m), weight 48.8 kg, SpO2 100 %.  Medical Problem List and Plan: 1.  Impaired mobility and ADLs secondary to cardiac debility s/p CABGx3.             Completed 2-week course of colchicine for pericardial irritation  Continue CIR  2.  Antithrombotics: -DVT/anticoagulation: Starting Eliquis 2.5 mg BID             -antiplatelet therapy: On ASA 3. Pain Management: Denies pain, not receiving any pain medications.   Lidoderm patch DCed due to lack of benefit. 4. Mood: LCSW to follow for evaluations and support.              -antipsychotic agents:  N/A 5. Neuropsych: This patient is capable of making decisions on his own behalf. 6. Skin/Wound Care: Monitor incisions for healing.  7. Fluids/Electrolytes/Nutrition: Offer supplements between meals to promote wound healing.  8.  AFib: Continue to monitor HR and with increased in activity. Continue Amiodarone bid  Mildly bradycardic on 2/21, may consider decreasing dose of amiodarone  Will attempt to reach out to therapies regarding heart rate during therapies  ECG reviewed, showing sinus bradycardia  2/22- called Cards consult to assess bradycardia to see if can reduce amiodarone, etc  2/23- Cards stopped Amiodarone- no Afib; bradycardia still notable. CXR shows improving L pleural effusion and stable small R pleural effusion- no pulm edema.  9. Persistent Leucocytosis/pneumonia:   Thoracentesis performed by IR on 2/11, positive WBCs, no growth  Completed course of cefepime  WBC 13.0 on 2/18, labs ordered for tomorrow  Con't nebs q6 hours scheduled, if doesn't get better, might benefit from some PO steroids.     SOB stable on 2/21  2/22- stable SOB/DOE, but now only walking 30 ft vs 323ft- will consult IM to see if can assist; if bradycardia is not only cause of Sx's.   2/23- IM signed off- feels it's cardiac.  10.  Acute kidney injury:   Aldactone 12.5 mg daily along with Lasix 20 mEq daily  Creatinine 1.48 on 2/20, labs ordered for tomorrow  2/22- Cr stable  at 1.48- con't treatment  2/23- Cr stable and BUN down to 29 from 34- better  Encourage fluids 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 On ASA, Losartan, digoxin, coreg, Lipitor.   Filed Weights   01/22/20 0500 01/23/20 0500 01/26/20 0453  Weight: 47.6 kg 47.9 kg 48.8 kg   Stable on 2/20, no repeat weights today  2/22- Stable at 47.9 kg today  2/23- Weight 48.8 12. Thrombocytopenia: Has resolved.  13. Shortness of breath: Encourage IS. Continue   See #9  2/22- see #9' of note, WBC down to 9.1 finally; and on Xopenex scheduled q6 hours 14. Constipation: Resolved. Can DC daily docusate, miralax, and dulcolax. Patient is refusing and moving bowels regularly.  15. Hypernatremia: Resolved 16.  Sleep  disturbance  Improving  17.  Disposition: Lives alone, but has supportive family, including daughter, nearby.  18.  Anemia  Hemoglobin 10.3 on 2/18, labs ordered for tomorrow  2/22- Hb stable at 10.5- con't to monitor and con't Iron to improve Hb  Continue to monitor 19. Dispo  2/22- was planned ot d/c tomorrow, but pt hasn't improved his level of function- now 30 ft Supervision and was 300 ft mod I- asked IM to assist/second opinion- might not d/c tomorrow.  2/23- will hold onto until feeling somewhat better- hopefully in 1-2 days- has to go home Mod I.     LOS: 18 days A FACE TO FACE EVALUATION WAS PERFORMED  Dennis Killilea 01/26/2020, 9:23 AM

## 2020-01-26 NOTE — Patient Care Conference (Signed)
Inpatient RehabilitationTeam Conference and Plan of Care Update Date: 01/26/2020   Time: 11:30 AM    Patient Name: David Perez      Medical Record Number: [id number]  Date of Birth: [birth year] Sex: Male         Room/Bed: 4W03C/4W03C-01 Payor Info: Payor: MEDICARE / Plan: MEDICARE PART A AND B / Product Type: *No Product type* /    Admit Date/Time:  01/08/2020  6:08 PM  Primary Diagnosis:  Debility  Patient Active Problem List   Diagnosis Date Noted  . CHF (congestive heart failure) ([place])   . Leukocytosis   . Anemia   . Acute systolic congestive heart failure ([place])   . AKI (acute kidney injury) ([place])   . SOB (shortness of breath)   . Acute on chronic systolic CHF (congestive heart failure) ([place])   . Debility 01/08/2020  . 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 STEMI (ST elevation myocardial infarction) ([place]) 12/31/2019  . 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 Courtney Heys Social Worker Present: Loralee Pacas, LCSW) Nurse Present: Other (comment)(Inka Jamison Oka, LPN) Case Manager: Karene Fry, RN PT Present: Apolinar Junes, PT OT Present: Elisabeth Most, OT SLP Present: Jettie Booze, CF-SLP PPS Coordinator present : Ileana Ladd, Burna Mortimer, SLP     Current Status/Progress Goal Weekly Team Focus  Bowel/Bladder   pt continent of b&b, lbm 2/23  Pt will remain continent of b/b  assess toileting needs prn/q shift   Swallow/Nutrition/ Hydration             ADL's   functional transfers and self care CS, rest breaks  CS/mod I  energy conservation, HM training   Mobility   Mod I bed mobility, supervision transfer and gait with RW, gait limited to 10-15 ft on 2/22 due to fatigue/SOB, CGA 4 steps 1 rail  Mod I bed mobility and transfers, supervision gait, CGA 3 steps with 1 rail  Activity tolerance, balance, functional mobility, d/c planning,    Communication             Safety/Cognition/ Behavioral Observations            Pain   pt complains of discomfort in chest  Pt pain <4  assess pain q shift/prn   Skin   pt has midline incision, L leg incision, and small skin tears on R wrist, L ankle  Pt will remain free from infection and further skin breakdown.  assess skin q shift/prn    Rehab Goals Patient on target to meet rehab goals: Yes *See Care Plan and progress notes for long and short-term goals.     Barriers to Discharge  Current Status/Progress Possible Resolutions Date Resolved   Nursing                  PT  Medical stability  Orthostatic hypotension and increased SOB and fatigue with minimal activity on 2/22  Medical team adjusting medication, will continue to assess activity tolerance with mobility following changes           OT                  SLP                SW                Discharge Planning/Teaching Needs:  D/c to home with 24/7 care  Family education   Team Discussion: Pleural effusion improving on chest Xray, monitoring creatinine and BUN, changed Amio, SOB d/t bradycardia.  RN cont, chest incision healed, not on O2.  OT light headed, BP 121/72 and HR 72, showered and dressed with rest breaks.  PT amb 10-15' yesterday, orthostatic, goals S.  Dtrs can provide S initially.  Expect DC in 1-2 days once medically stable.   Revisions to Treatment Plan: N/A     Medical Summary Current Status: continent; inicsion healed Weekly Focus/Goal: OT- wanted to take shower- was lightheaded 121/72- was sitting 1 minute- HR 72- got self dressed after Mod I shower;  Barriers to Discharge: Behavior;Decreased family/caregiver support;Home enviroment access/layout;Weight bearing restrictions;Weight;Wound care;Medical stability  Barriers to Discharge Comments: n/a Possible Resolutions to Barriers: PT- 10-15 ft yesterday; orthostatic Sx's- 70-40s standing yesterday- limiting therapy; see how does today   Continued  Need for Acute Rehabilitation Level of Care: The patient requires daily medical management by a physician with specialized training in physical medicine and rehabilitation for the following reasons: Direction of a multidisciplinary physical rehabilitation program to maximize functional independence : Yes Medical management of patient stability for increased activity during participation in an intensive rehabilitation regime.: Yes Analysis of laboratory values and/or radiology reports with any subsequent need for medication adjustment and/or medical intervention. : Yes   I attest that I was present, lead the team conference, and concur with the assessment and plan of the team.   Jodell Cipro M 01/27/2020, 8:15 PM   Team conference was held via web/ teleconference due to [place] - 19

## 2020-01-26 NOTE — Plan of Care (Signed)
  Problem: Consults Goal: RH GENERAL PATIENT EDUCATION Description: See Patient Education module for education specifics. Outcome: Progressing   Problem: RH SKIN INTEGRITY Goal: RH STG MAINTAIN SKIN INTEGRITY WITH ASSISTANCE Description: STG Maintain Skin Integrity With min Assistance. Outcome: Progressing Goal: RH STG ABLE TO PERFORM INCISION/WOUND CARE W/ASSISTANCE Description: STG Able To Perform Incision/Wound Care With min Assistance. Outcome: Progressing   Problem: RH SAFETY Goal: RH STG ADHERE TO SAFETY PRECAUTIONS W/ASSISTANCE/DEVICE Description: STG Adhere to Safety Precautions With min Assistance and appropriate assistive Device. Outcome: Progressing   Problem: RH PAIN MANAGEMENT Goal: RH STG PAIN MANAGED AT OR BELOW PT'S PAIN GOAL Description: <3 on a 0-10 pain scale Outcome: Progressing   Problem: RH KNOWLEDGE DEFICIT GENERAL Goal: RH STG INCREASE KNOWLEDGE OF SELF CARE AFTER HOSPITALIZATION Description: Patient will demonstrate knowledge of medication management, incision site care, and follow up care with the MD post discharge with min assist from rehab staff. Outcome: Progressing

## 2020-01-26 NOTE — Progress Notes (Addendum)
Patient ID: David Perez, male   DOB: 09/10/1941, 79 y.o.   MRN: [id number]     Advanced Heart Failure Rounding Note  PCP-Cardiologist: No primary care provider on file.   Subjective:    1/28: CABG (LIMA-LAD, SVG-LPDA, SVG-OM1), Maze + LA appendage clip, bioprosthetic AVR.   Echo: EF 30%, small posterior pericardial effusion, decreased RV systolic function.   S/P Left Thoracentesis 01/14/20 with 1.4 liters removed.   Developed increased dyspnea this past weekend. Feels winded during conversation and dyspnea worse w/ exertion. Feels "wiped out" after PT. No CP. Hgb has been stable ~10.   EKG 2/22 showed sinus brady, 58 bpm  CXR 2/22 showed improving left pleural effusion and stable small rt pleural effusion.  BNP elevated at 428. Wt up 5 lb in the last week. SCr stable.     Objective:   Weight Range: 48.8 kg Body mass index is 16.36 kg/m.   Vital Signs:   Temp:  [97.4 F (36.3 C)-98.2 F (36.8 C)] 98.2 F (36.8 C) (02/23 0453) Pulse Rate:  [51-68] 68 (02/23 0906) Resp:  [16-18] 18 (02/23 0453) BP: (110-114)/(52-60) 114/59 (02/23 0453) SpO2:  [96 %-100 %] 100 % (02/23 0755) Weight:  [48.8 kg] 48.8 kg (02/23 0453) Last BM Date: 01/25/20  Weight change: Filed Weights   01/22/20 0500 01/23/20 0500 01/26/20 0453  Weight: 47.6 kg 47.9 kg 48.8 kg    Intake/Output:   Intake/Output Summary (Last 24 hours) at 01/26/2020 1130 Last data filed at 01/26/2020 0456 Gross per 24 hour  Intake 480 ml  Output 525 ml  Net -45 ml      Physical Exam   General: Thin elderly WM, NAD Neck: No JVD, no thyromegaly or thyroid nodule.  Lungs: decreased BS bilaterally at the bases, L>R  CV: Nondisplaced PMI.  Heart regular S1/S2, no S3/S4, no murmur.  No peripheral edema.  Abdomen: Soft, nontender, no hepatosplenomegaly, no distention.  Skin: Intact without lesions or rashes.  Neurologic: Alert and oriented x 3.  Psych: Normal affect. Extremities: thin extremities. No clubbing or  cyanosis.  HEENT: Normal.    Labs    CBC Recent Labs    01/25/20 0509  WBC 9.1  NEUTROABS 6.2  HGB 10.5*  HCT 33.0*  MCV 104.1*  PLT 035   Basic Metabolic Panel Recent Labs    01/25/20 1649 01/26/20 0532  NA 138 141  K 4.3 4.2  CL 109 111  CO2 20* 22  GLUCOSE 124* 98  BUN 34* 29*  CREATININE 1.44* 1.42*  CALCIUM 8.6* 8.8*   Liver Function Tests No results for input(s): AST, ALT, ALKPHOS, BILITOT, PROT, ALBUMIN in the last 72 hours. No results for input(s): LIPASE, AMYLASE in the last 72 hours. Cardiac Enzymes No results for input(s): CKTOTAL, CKMB, CKMBINDEX, TROPONINI in the last 72 hours.  BNP: BNP (last 3 results) Recent Labs    01/25/20 1649  BNP 428.1*    ProBNP (last 3 results) No results for input(s): PROBNP in the last 8760 hours.   D-Dimer No results for input(s): DDIMER in the last 72 hours. Hemoglobin A1C No results for input(s): HGBA1C in the last 72 hours. Fasting Lipid Panel No results for input(s): CHOL, HDL, LDLCALC, TRIG, CHOLHDL, LDLDIRECT in the last 72 hours. Thyroid Function Tests No results for input(s): TSH, T4TOTAL, T3FREE, THYROIDAB in the last 72 hours.  Invalid input(s): FREET3  Other results:   Imaging    DG Chest 2 View  Result Date: 01/25/2020 CLINICAL DATA:  CHF.  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 Improving left pleural effusion from prior exams. Small right pleural effusion is not significantly changed. Chronic hyperinflation. No pulmonary edema or new airspace disease. No pneumothorax. Exaggerated thoracic kyphosis with multilevel degenerative change in the spine. IMPRESSION: Improving left pleural effusion. Stable small right pleural effusion. Electronically Signed   By: Keith Rake M.D.   On: 01/25/2020 19:29     Medications:     Scheduled Medications: . apixaban  2.5 mg Oral Q12H  . aspirin  81 mg Oral  Daily  . atorvastatin  80 mg Oral q1800  . carvedilol  3.125 mg Oral BID WC  . chlorhexidine  15 mL Mouth/Throat BID  . digoxin  0.0625 mg Oral Daily  . feeding supplement (ENSURE ENLIVE)  237 mL Oral TID BM  . furosemide  20 mg Oral Daily  . levalbuterol  0.63 mg Nebulization TID  . losartan  12.5 mg Oral Daily  . polycarbophil  1,250 mg Oral Daily  . potassium chloride  20 mEq Oral Daily  . spironolactone  12.5 mg Oral Daily  . traZODone  50 mg Oral QHS    Infusions:   PRN Medications: acetaminophen, alum & mag hydroxide-simeth, bisacodyl, diphenhydrAMINE, guaiFENesin-dextromethorphan, levalbuterol, Melatonin, oxyCODONE, polyethylene glycol, prochlorperazine **OR** prochlorperazine **OR** prochlorperazine, sodium phosphate   Assessment/Plan   1. CAD: STEMI with very short LM and 99% ostial LAD. PCI thought to risk compromise of ostial large LCx, so decision made to defer PCI and transfer to [facility] for CABG as patient was CP-free at that point. Now s/p CABG with LIMA-LAD, SVG-OM2, SVG-LPDA.  No chest pain.  - Continue ASA.  - Continue atorvastatin 80 mg daily 2. Chronic systolic CHF: Ischemic cardiomyopathy.  Pre-op echo with EF 35%.  Now s/p CABG-AVR.  Echo post-op with EF 30%, RV dysfunction. Milrinone discontinued.  On exam today, he does not appear grossly volume overloaded, but BNP elevated at  428, complaining of increased dyspnea and steady wt gain, up 5 lb over the last week - Increase Lasix to 40 mg daily. Follow BMP     - Continue spironolactone 12.5 daily.   - Continue Coreg 3.125 mg bid.  - Continue digoxin 0.0625 daily, dig level ok 0.7 (2/23) - Continue losartan 12.5 mg  3. Atrial fibrillation: S/p Maze, LA appendage clip.    - EKG 2/22 showed NSR/sinus brady. Amiodarone discontinued 2/22 - Continue eliquis 2.5 mg twice a day. Lower dose with age and weight.  4. Hyperlipidemia: LDL 187. Goal <70.  - Treat w/ atorvastatin 80 qhs.  - Repeat FLP + HFTs in 6 weeks. If  not at goal will need addition of Zetia +/- PCSK9i  5. Thrombocytopenia: Post-op, trending up. Platelets stable.  6. Left pleural effusion: 01/14/20 S/P Left thoracentesis 1.4 liters.   -  Repeat CXR 2/22 showed improving left pleural effusion and stable small rt pleural effusion.  7. AKI: Creatinine stable 1.4 c/w baseline. Repeat BMP in the am w/ Lasix increase. 8. Delirium: Resolved.  9. ID: He completed course of cefepime in CIR.  10. Elevated LFTs: ?Shock liver from hypotensive episode immediately post-op. LFTs trended down.  RUQ US unremarkable.  11. Deconditioning.  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  CRP up but not ESR.   - Completed 2 wk course of colchicine.   Length of Stay: 18  Lyda Jester, PA-C  01/26/2020, 11:30 AM  Advanced Heart Failure Team Pager [phone] (M-F; 7a - 4p)  Please contact [facility] for night-coverage after hours (4p -7a ) and weekends on [email or web address]  Patient seen with PA, agree with the above note.   CXR yesterday looked better (smaller effusion). BNP mildly elevated.  Digoxin level normal.  He is now off amiodarone. Still having episodes of dyspnea and weight is up some.   On exam, he does not look volume overloaded.   I will increase Lasix to 40 mg daily for gentle diuresis, follow BMET daily.   Loralie Champagne 01/26/2020 1:29 PM

## 2020-01-27 ENCOUNTER — Inpatient Hospital Stay (HOSPITAL_COMMUNITY): Payer: Medicare Other

## 2020-01-27 LAB — BASIC METABOLIC PANEL
Anion gap: 9 (ref 5–15)
BUN: 26 mg/dL — ABNORMAL HIGH (ref 8–23)
CO2: 25 mmol/L (ref 22–32)
Calcium: 9.1 mg/dL (ref 8.9–10.3)
Chloride: 107 mmol/L (ref 98–111)
Creatinine, Ser: 1.39 mg/dL — ABNORMAL HIGH (ref 0.61–1.24)
GFR calc Af Amer: 55 mL/min — ABNORMAL LOW (ref >60)
GFR calc non Af Amer: 47 mL/min — ABNORMAL LOW (ref >60)
Glucose, Bld: 101 mg/dL — ABNORMAL HIGH (ref 70–99)
Potassium: 4.2 mmol/L (ref 3.5–5.1)
Sodium: 141 mmol/L (ref 135–145)

## 2020-01-27 MED ORDER — AMIODARONE HCL 200 MG PO TABS
200.0000 mg | ORAL_TABLET | Freq: Every day | ORAL | Status: DC
  Administered 2020-01-27 – 2020-01-29 (×3): 200 mg via ORAL
  Filled 2020-01-27 (×3): qty 1

## 2020-01-27 MED ORDER — LOSARTAN POTASSIUM 25 MG PO TABS
12.5000 mg | ORAL_TABLET | Freq: Every day | ORAL | Status: DC
  Administered 2020-01-28: 21:00:00 12.5 mg via ORAL
  Filled 2020-01-27 (×2): qty 0.5

## 2020-01-27 MED ORDER — SPIRONOLACTONE 12.5 MG HALF TABLET
12.5000 mg | ORAL_TABLET | Freq: Every day | ORAL | Status: DC
  Administered 2020-01-28: 12.5 mg via ORAL
  Filled 2020-01-27 (×2): qty 1

## 2020-01-27 NOTE — Progress Notes (Signed)
Physical Therapy Session Note  Patient Details  Name: David Perez MRN: [id number] Date of Birth: 04-22-1941  Today's Date: 01/27/2020 PT Individual Time: 1115-1215 PT Individual Time Calculation (min): 60 min   Short Term Goals: Week 3:  PT Short Term Goal 1 (Week 3): STG=LTG due to ELOS.  Skilled Therapeutic Interventions/Progress Updates:     Patient in w/c with his daughter in the room upon PT arrival. Patient alert and agreeable to PT session. Patient denied pain during session. Focused session on seated LE exercise, assessment of orthostaic hypotension, and symptoms of SOB and fatigue with mobility.   Orthostatic Vitals: Sitting at beginning of session: BP 86/60, HR 64, O2 99% on RA Supine: BP 123/65, HR 62, O2 98% Sitting: BP 91/61, HR 62, 98% with SOB Donned B thigh high TED hose and 2 4" ACE wraps to B LEs Supine: BP 107/58, HR 54, O2 99% Sitting: BP 93/60, HR 61, O2 98% Standing: BP 102/72, HR 58, O2 97% Tolerated standing <1 min, unable to maintain standing x2 min due to SOB and fatigue  Therapeutic Activity: Bed Mobility: Patient performed supine to/from sit with min A-supervision due to fatigue. Maintained sternal precautions without use of UEs with min cues x1. Transfers: Patient performed sit to/from stand x2 and stand pivot x1 with CGA-close supervision for safety. Maintained sternal precautions without use of UEs with min cues x1.  Therapeutic Exercise: Patient performed the following seated exercises using an orange resistance band with verbal and tactile cues for proper technique. -hip abduction 2x10 -alternating hip flexion 2x10 -knee hip flexion/extension pushing into the band at the foot 2x10 Required seated rest breaks between each exercise due to mild SOB and fatigue  Patient in bed with his daughter in the room at end of session with breaks locked,  and all needs within reach.    Therapy Documentation Precautions:  Precautions Precautions: Sternal,  Fall Precaution Comments: Reviewed sternal precautions Restrictions Weight Bearing Restrictions: No Other Position/Activity Restrictions: sternal precautions    Therapy/Group: Individual Therapy  Alean Kromer L Kimara Bencomo PT, DPT  01/27/2020, 4:04 PM

## 2020-01-27 NOTE — Progress Notes (Signed)
Patient ID: David Perez, male   DOB: 16-Nov-1941, 79 y.o.   MRN: [id number]     Advanced Heart Failure Rounding Note  PCP-Cardiologist: No primary care provider on file.   Subjective:    1/28: CABG (LIMA-LAD, SVG-LPDA, SVG-OM1), Maze + LA appendage clip, bioprosthetic AVR.   Echo: EF 30%, small posterior pericardial effusion, decreased RV systolic function.   S/P Left Thoracentesis 01/14/20 with 1.4 liters removed.   He reports ongoing dyspnea, given extra Lasix yesterday for total of 40 mg.  He did have orthostatic symptoms yesterday as well.  This morning, denies lightheadedness with standing.  His nurse noted irregular rhythm with HR up and down this morning, ECG with accelerated junctional rhythm and short SVT runs.  CXR did not show re-accumulation of pleural effusion, PNA, or significant pulmonary edema.    Objective:   Weight Range: 48.7 kg Body mass index is 16.32 kg/m.   Vital Signs:   Temp:  [97.5 F (36.4 C)-98.2 F (36.8 C)] 98.2 F (36.8 C) (02/24 0452) Pulse Rate:  [49-71] 71 (02/24 0936) Resp:  [18-19] 18 (02/24 0452) BP: (82-121)/(60-68) 82/68 (02/24 0936) SpO2:  [97 %-100 %] 100 % (02/24 0936) Weight:  [48.7 kg] 48.7 kg (02/24 0452) Last BM Date: 01/26/20  Weight change: Filed Weights   01/23/20 0500 01/26/20 0453 01/27/20 0452  Weight: 47.9 kg 48.8 kg 48.7 kg    Intake/Output:   Intake/Output Summary (Last 24 hours) at 01/27/2020 1045 Last data filed at 01/27/2020 0802 Gross per 24 hour  Intake 840 ml  Output 625 ml  Net 215 ml      Physical Exam   General: NAD Neck: No JVD, no thyromegaly or thyroid nodule.  Lungs: Clear to auscultation bilaterally with normal respiratory effort. CV: Nondisplaced PMI.  Heart regular S1/S2, no S3/S4, no murmur.  No peripheral edema.   Abdomen: Soft, nontender, no hepatosplenomegaly, no distention.  Skin: Intact without lesions or rashes.  Neurologic: Alert and oriented x 3.  Psych: Normal affect. Extremities:  No clubbing or cyanosis.  HEENT: Normal.    Labs    CBC Recent Labs    01/25/20 0509  WBC 9.1  NEUTROABS 6.2  HGB 10.5*  HCT 33.0*  MCV 104.1*  PLT 299   Basic Metabolic Panel Recent Labs    01/26/20 0532 01/27/20 0700  NA 141 141  K 4.2 4.2  CL 111 107  CO2 22 25  GLUCOSE 98 101*  BUN 29* 26*  CREATININE 1.42* 1.39*  CALCIUM 8.8* 9.1   Liver Function Tests No results for input(s): AST, ALT, ALKPHOS, BILITOT, PROT, ALBUMIN in the last 72 hours. No results for input(s): LIPASE, AMYLASE in the last 72 hours. Cardiac Enzymes No results for input(s): CKTOTAL, CKMB, CKMBINDEX, TROPONINI in the last 72 hours.  BNP: BNP (last 3 results) Recent Labs    01/25/20 1649  BNP 428.1*    ProBNP (last 3 results) No results for input(s): PROBNP in the last 8760 hours.   D-Dimer No results for input(s): DDIMER in the last 72 hours. Hemoglobin A1C No results for input(s): HGBA1C in the last 72 hours. Fasting Lipid Panel No results for input(s): CHOL, HDL, LDLCALC, TRIG, CHOLHDL, LDLDIRECT in the last 72 hours. Thyroid Function Tests No results for input(s): TSH, T4TOTAL, T3FREE, THYROIDAB in the last 72 hours.  Invalid input(s): FREET3  Other results:   Imaging    No results found.   Medications:     Scheduled Medications: . amiodarone  200 mg  Oral Daily  . apixaban  2.5 mg Oral Q12H  . aspirin  81 mg Oral Daily  . atorvastatin  80 mg Oral q1800  . carvedilol  3.125 mg Oral BID WC  . chlorhexidine  15 mL Mouth/Throat BID  . digoxin  0.0625 mg Oral Daily  . feeding supplement (ENSURE ENLIVE)  237 mL Oral TID BM  . furosemide  20 mg Oral Daily  . levalbuterol  0.63 mg Nebulization TID  . [START ON 01/28/2020] losartan  12.5 mg Oral QHS  . polycarbophil  1,250 mg Oral Daily  . potassium chloride  20 mEq Oral Daily  . [START ON 01/28/2020] spironolactone  12.5 mg Oral QHS  . traZODone  50 mg Oral QHS    Infusions:   PRN Medications: acetaminophen,  alum & mag hydroxide-simeth, bisacodyl, diphenhydrAMINE, guaiFENesin-dextromethorphan, levalbuterol, Melatonin, oxyCODONE, polyethylene glycol, prochlorperazine **OR** prochlorperazine **OR** prochlorperazine, sodium phosphate   Assessment/Plan   1. CAD: STEMI with very short LM and 99% ostial LAD. PCI thought to risk compromise of ostial large LCx, so decision made to defer PCI and transfer to [facility] for CABG as patient was CP-free at that point. Now s/p CABG with LIMA-LAD, SVG-OM2, SVG-LPDA.  No chest pain.  - Continue ASA.  - Continue atorvastatin 80 mg daily 2. Chronic systolic CHF: Ischemic cardiomyopathy.  Pre-op echo with EF 35%.  Now s/p CABG-AVR.  Echo post-op with EF 30%, RV dysfunction. Milrinone discontinued.  He continues to report dyspnea, but he does not appear volume overloaded on exam.  CXR was not impressive.  - With orthostasis, would keep Lasix at 20 mg daily.    - Continue spironolactone 12.5 daily, change dosing time to qhs.   - Continue Coreg 3.125 mg bid.  - Continue digoxin 0.0625 daily, dig level ok 0.7 (2/23) - Continue losartan 12.5 mg daily, change dosing time to qhs.   3. Atrial fibrillation: S/p Maze, LA appendage clip.  Amiodarone was stopped 2/22, but now with SVT runs (?atrial fibrillation) and accelerated junctional rhythm on ECG today.   - Will restart amiodarone at 200 mg daily for now.  - Continue eliquis 2.5 mg twice a day. Lower dose with age and weight.  4. Hyperlipidemia: LDL 187. Goal <70.  - Treat w/ atorvastatin 80 qhs.  - Repeat FLP + HFTs in 6 weeks. If not at goal will need addition of Zetia +/- PCSK9i  5. Thrombocytopenia: Post-op, trending up. Platelets stable.  6. Left pleural effusion: 01/14/20 S/P Left thoracentesis 1.4 liters.  Repeat CXR 2/22 showed improving left pleural effusion and stable small rt pleural effusion.  7. AKI: Creatinine stable 1.39 c/w baseline. 8. Delirium: Resolved.  9. ID: He completed course of cefepime in CIR.  10.  Elevated LFTs: ?Shock liver from hypotensive episode immediately post-op. LFTs trended down.  RUQ US unremarkable.  11. Deconditioning.  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  CRP up but not ESR.   - Completed 2 wk course of colchicine.   Length of Stay: [place], MD  01/27/2020, 10:45 AM  Advanced Heart Failure Team Pager [phone] (M-F; 7a - 4p)  Please contact [facility] for night-coverage after hours (4p -7a ) and weekends on [email or web address]

## 2020-01-27 NOTE — Progress Notes (Signed)
[facility] PHYSICAL MEDICINE & REHABILITATION PROGRESS NOTE   Subjective/Complaints:  Pt reports had orthostatic hypotension yesterday afternoon- spoke to Cards about it- decided to keep Lasix at 20 mg- however then had runs of junctional rhythm today on EKG- so Amiodarone was restarted at 200 mg daily.   Pt reports "just woke up" but feeling about the same as yesterday- just hasn't walked yet- literally was sitting up when I entered room, and hadn't eaten yet.    ROS:(+)SOB and DOE still; still no CP; still no N/V/C/D Objective:   No results found. Recent Labs    01/25/20 0509  WBC 9.1  HGB 10.5*  HCT 33.0*  PLT 314   Recent Labs    01/26/20 0532 01/27/20 0700  NA 141 141  K 4.2 4.2  CL 111 107  CO2 22 25  GLUCOSE 98 101*  BUN 29* 26*  CREATININE 1.42* 1.39*  CALCIUM 8.8* 9.1    Intake/Output Summary (Last 24 hours) at 01/27/2020 1855 Last data filed at 01/27/2020 1813 Gross per 24 hour  Intake 1200 ml  Output 625 ml  Net 575 ml     Physical Exam:  Reviewed vitals and labs- Cr/BUN slightly improved  Vital Signs Blood pressure (!) 109/56, pulse (!) 52, temperature 98.7 F (37.1 C), resp. rate 20, height 5\' 8"  (1.727 m), weight 48.7 kg, SpO2 98 %. Constitutional: pt barely awake- just woke- is running late- sat up in bed; appears a little disheveled from sleep; NAD HENT: Normocephalic.  Atraumatic. Eyes: conjugate gaze still Cardiovascular: RRR when I listened for long period- rate in 60's   Respiratory: decreased on L>R base however better air movement today- from bigger breaths; CTA B/L otherwise  GI: cachetic, appearing, very thin, but soft, NT, ND< (+)BS hypoactive Skin: Warm and dry.  Intact. CABG incision healed- bump near top that's firm Psych: still anxious Musc: No edema in extremities.  No tenderness in extremities. Neuro:  Alert Motor: 5/5 throughout  Assessment/Plan: 1. Functional deficits secondary to debility due to CABG x3  which require 3+  hours per day of interdisciplinary therapy in a comprehensive inpatient rehab setting.  Physiatrist is providing close team supervision and 24 hour management of active medical problems listed below.  Physiatrist and rehab team continue to assess barriers to discharge/monitor patient progress toward functional and medical goals  Care Tool:  Bathing    Body parts bathed by patient: Right arm, Left arm, Chest, Abdomen, Front perineal area, Buttocks, Right upper leg, Left upper leg, Right lower leg, Left lower leg, Face   Body parts bathed by helper: Chest, Abdomen, Right lower leg, Left lower leg     Bathing assist Assist Level: Supervision/Verbal cueing     Upper Body Dressing/Undressing Upper body dressing Upper body dressing/undressing activity did not occur (including orthotics): N/A What is the patient wearing?: Pull over shirt    Upper body assist Assist Level: Set up assist    Lower Body Dressing/Undressing Lower body dressing      What is the patient wearing?: Pants     Lower body assist Assist for lower body dressing: Supervision/Verbal cueing     Toileting Toileting    Toileting assist Assist for toileting: Supervision/Verbal cueing     Transfers Chair/bed transfer  Transfers assist     Chair/bed transfer assist level: Contact Guard/Touching assist Chair/bed transfer assistive device: Programmer, multimedia   Ambulation assist      Assist level: Supervision/Verbal cueing Assistive device: Walker-rolling Max distance:  50'   Walk 10 feet activity   Assist     Assist level: Supervision/Verbal cueing Assistive device: Walker-rolling   Walk 50 feet activity   Assist    Assist level: Supervision/Verbal cueing Assistive device: Walker-rolling    Walk 150 feet activity   Assist Walk 150 feet activity did not occur: Safety/medical concerns(decreased activity tolerance, SOB)  Assist level: Supervision/Verbal cueing Assistive  device: Walker-rolling    Walk 10 feet on uneven surface  activity   Assist     Assist level: Contact Guard/Touching assist Assistive device: Walker-rolling   Wheelchair     Assist Will patient use wheelchair at discharge?: Yes Type of Wheelchair: Manual    Wheelchair assist level: Independent Max wheelchair distance: 34'    Wheelchair 50 feet with 2 turns activity    Assist    Wheelchair 50 feet with 2 turns activity did not occur: Safety/medical concerns(LE only due to sternal precautions, decreased activity tolerance)       Wheelchair 150 feet activity     Assist  Wheelchair 150 feet activity did not occur: Safety/medical concerns(LE only due to sternal precautions, decreased activity tolerance)       Blood pressure (!) 109/56, pulse (!) 52, temperature 98.7 F (37.1 C), resp. rate 20, height 5\' 8"  (1.727 m), weight 48.7 kg, SpO2 98 %.  Medical Problem List and Plan: 1.  Impaired mobility and ADLs secondary to cardiac debility s/p CABGx3.             Completed 2-week course of colchicine for pericardial irritation  Continue CIR  2.  Antithrombotics: -DVT/anticoagulation: Starting Eliquis 2.5 mg BID             -antiplatelet therapy: On ASA 3. Pain Management: Denies pain, not receiving any pain medications.   Lidoderm patch DCed due to lack of benefit. 4. Mood: LCSW to follow for evaluations and support.              -antipsychotic agents:  N/A 5. Neuropsych: This patient is capable of making decisions on his own behalf. 6. Skin/Wound Care: Monitor incisions for healing.  7. Fluids/Electrolytes/Nutrition: Offer supplements between meals to promote wound healing.  8. AFib: Continue to monitor HR and with increased in activity. Continue Amiodarone bid  Mildly bradycardic on 2/21, may consider decreasing dose of amiodarone  Will attempt to reach out to therapies regarding heart rate during therapies  ECG reviewed, showing sinus bradycardia  2/22-  called Cards consult to assess bradycardia to see if can reduce amiodarone, etc  2/23- Cards stopped Amiodarone- no Afib; bradycardia still notable. CXR shows improving L pleural effusion and stable small R pleural effusion- no pulm edema.  2/24- Amiodarone restarted due to EKG results with faster junctional rhythm per Cards- also changed Aldactone to night time.   9. Persistent Leucocytosis/pneumonia:   Thoracentesis performed by IR on 2/11, positive WBCs, no growth  Completed course of cefepime  WBC 13.0 on 2/18, labs ordered for tomorrow  Con't nebs q6 hours scheduled, if doesn't get better, might benefit from some PO steroids.     SOB stable on 2/21  2/22- stable SOB/DOE, but now only walking 30 ft vs 324ft- will consult IM to see if can assist; if bradycardia is not only cause of Sx's.   2/23- IM signed off- feels it's cardiac.  10.  Acute kidney injury:   Aldactone 12.5 mg daily along with Lasix 20 mEq daily  Creatinine 1.48 on 2/20, labs ordered for tomorrow  2/22-  Cr stable at 1.48- con't treatment  2/23- Cr stable and BUN down to 29 from 34- better  2/24- Cr 1.39- about the same and BUN slightly improved at 26  Encourage fluids but stick to 1.5L daily.  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 On ASA, Losartan, digoxin, coreg, Lipitor.   Filed Weights   01/23/20 0500 01/26/20 0453 01/27/20 0452  Weight: 47.9 kg 48.8 kg 48.7 kg   Stable on 2/20, no repeat weights today  2/22- Stable at 47.9 kg today  2/23- Weight 48.8  2/24- weight stable- not fluid overloaded 12. Thrombocytopenia: Has resolved.  13. Shortness of breath: Encourage IS. Continue   See #9  2/22- see #9' of note, WBC down to 9.1 finally; and on Xopenex scheduled q6 hours 14. Constipation: Resolved. Can DC daily docusate, miralax, and dulcolax. Patient is refusing and moving bowels regularly.  15. Hypernatremia: Resolved 16.  Sleep disturbance  Improving  17.  Disposition:  Lives alone, but has supportive family, including daughter, nearby.  18.  Anemia  Hemoglobin 10.3 on 2/18, labs ordered for tomorrow  2/22- Hb stable at 10.5- con't to monitor and con't Iron to improve Hb  Continue to monitor 19. Dispo  2/22- was planned ot d/c tomorrow, but pt hasn't improved his level of function- now 30 ft Supervision and was 300 ft mod I- asked IM to assist/second opinion- might not d/c tomorrow.  2/23- will hold onto until feeling somewhat better- hopefully in 1-2 days- has to go home Mod I.    2/24- Amiodarone restarted- hope is he will be stable by Friday for d/c.    LOS: 19 days A FACE TO FACE EVALUATION WAS PERFORMED  Jacoya Bauman 01/27/2020, 6:55 PM

## 2020-01-27 NOTE — Progress Notes (Signed)
Social Work Patient ID: David Perez, male   DOB: February 09, 1941, 79 y.o.   MRN: [id number]    SW received updates from medical team, pt d/c date will be delayed to Friday in efforts to address medical concerns. Also reports, pt is amenable to placement. SW to follow up with pt to discuss further.   SW met with pt and pt dtr [name] in room to discuss short term rehab in SNF. Pt is undecided at this time. SW discussed SNF placement process. Pt provided SNF list for review. SW explained if pt chooses SNF, we will need DME (w/c, 3in1 BSC, shower bench, and RW) to return back to DME company. SW to follow-up with pt to discuss decision further.   [place] PASRR # [id number].  Pt will need FL2 completed and referral sent out.    Loralee Pacas, MSW, [place] Office: [phone] Cell: [phone] Fax: [phone]

## 2020-01-27 NOTE — Progress Notes (Signed)
Occupational Therapy Session Note  Patient Details  Name: David Perez MRN: [id number] Date of Birth: 31-Oct-1941  Today's Date: 01/27/2020 OT Individual Time: 0915-1000 OT Individual Time Calculation (min): 45 min    Short Term Goals: Week 1:  OT Short Term Goal 1 (Week 1): Pt will perform UB dressing with min A and min cuing to maintain precautions. OT Short Term Goal 1 - Progress (Week 1): Met OT Short Term Goal 2 (Week 1): Pt will perform bathing tasks with min A overall. OT Short Term Goal 2 - Progress (Week 1): Met OT Short Term Goal 3 (Week 1): PT will perform LB dressing with min A overall. OT Short Term Goal 3 - Progress (Week 1): Met  Skilled Therapeutic Interventions/Progress Updates:    1:1. Pt received in bed stating, "Ive spent every other day doing things other peoples way, today I am going to do it my way and see how it goes." pt states he wants to groom at sink, complete theraband LE therex and "maybe walk at the end." Pt requesting CGA for suping to sitting EOB despite ed that pt might not have this A at home. Pt stand pivot to w/c with no symptoms of orthostasis however BP dropping significantly sit to stand (see flowsheets for details) for assessemnt of orthostatic vitals. Pt ambulates 79' with S and VC for looking forward. Exited session with pt seated on toilet d/t urgent BM and RN aware of positioning/pt going to pull cord for A when finished.  Therapy Documentation Precautions:  Precautions Precautions: Sternal, Fall Precaution Comments: Reviewed sternal precautions Restrictions Weight Bearing Restrictions: No Other Position/Activity Restrictions: sternal precautions General:   Vital Signs: Oxygen Therapy SpO2: 97 % O2 Device: Room Air Pain:   ADL: ADL Eating: Independent Grooming: Modified independent Where Assessed-Grooming: Sitting at sink Upper Body Bathing: Supervision/safety Where Assessed-Upper Body Bathing: Shower Lower Body Bathing:  Supervision/safety Where Assessed-Lower Body Bathing: Shower Upper Body Dressing: Setup Where Assessed-Upper Body Dressing: Chair Lower Body Dressing: Setup Where Assessed-Lower Body Dressing: Chair Toileting: Supervision/safety Where Assessed-Toileting: Glass blower/designer: Close supervision Armed forces technical officer Method: Counselling psychologist: Engineer, technical sales Transfer: Close supervison Clinical cytogeneticist Method: Optometrist: Facilities manager: Close supervision Social research officer, government Method: Heritage manager: Nurse, learning disability    Praxis   Exercises:   Other Treatments:     Therapy/Group: Individual Therapy  Tonny Branch 01/27/2020, 9:21 AM

## 2020-01-27 NOTE — Progress Notes (Signed)
During assessment; pt sitting in w/c, no activity, HR irregular, changes for 70 to 140-170. Notified PA, EKG ordered and notified of results.

## 2020-01-28 ENCOUNTER — Inpatient Hospital Stay (HOSPITAL_COMMUNITY): Payer: Medicare Other | Admitting: Occupational Therapy

## 2020-01-28 ENCOUNTER — Inpatient Hospital Stay (HOSPITAL_COMMUNITY): Payer: Medicare Other

## 2020-01-28 LAB — BASIC METABOLIC PANEL
Anion gap: 9 (ref 5–15)
BUN: 29 mg/dL — ABNORMAL HIGH (ref 8–23)
CO2: 22 mmol/L (ref 22–32)
Calcium: 8.7 mg/dL — ABNORMAL LOW (ref 8.9–10.3)
Chloride: 110 mmol/L (ref 98–111)
Creatinine, Ser: 1.3 mg/dL — ABNORMAL HIGH (ref 0.61–1.24)
GFR calc Af Amer: 59 mL/min — ABNORMAL LOW (ref >60)
GFR calc non Af Amer: 51 mL/min — ABNORMAL LOW (ref >60)
Glucose, Bld: 91 mg/dL (ref 70–99)
Potassium: 4.9 mmol/L (ref 3.5–5.1)
Sodium: 141 mmol/L (ref 135–145)

## 2020-01-28 MED ORDER — LEVALBUTEROL HCL 0.63 MG/3ML IN NEBU
0.6300 mg | INHALATION_SOLUTION | Freq: Two times a day (BID) | RESPIRATORY_TRACT | Status: DC
  Filled 2020-01-28: qty 3

## 2020-01-28 MED ORDER — POTASSIUM CHLORIDE CRYS ER 10 MEQ PO TBCR
10.0000 meq | EXTENDED_RELEASE_TABLET | Freq: Every day | ORAL | Status: DC
  Administered 2020-01-29: 08:00:00 10 meq via ORAL
  Filled 2020-01-28: qty 1

## 2020-01-28 NOTE — Progress Notes (Addendum)
Patient ID: David Perez, male   DOB: November 19, 1941, 79 y.o.   MRN: [id number]     Advanced Heart Failure Rounding Note  PCP-Cardiologist: No primary care provider on file.   Subjective:    1/28: CABG (LIMA-LAD, SVG-LPDA, SVG-OM1), Maze + LA appendage clip, bioprosthetic AVR.   Echo: EF 30%, small posterior pericardial effusion, decreased RV systolic function.   S/P Left Thoracentesis 01/14/20 with 1.4 liters removed.   Continues w/ ongoing dyspnea, particularly w/ exertion. CXR did not show re-accumulation of pleural effusion, PNA, or significant pulmonary edema. EKG yesterday showed accelerated junctional rhythm and short SVT runs.  Low dose amiodarone added, 200 mg daily.   Symptoms uncharged. Still SOB w/ exertion and during conversation. No chest pain. He is frustrated.   Objective:   Weight Range: 48.7 kg Body mass index is 16.32 kg/m.   Vital Signs:   Temp:  [97.7 F (36.5 C)-98.7 F (37.1 C)] 97.7 F (36.5 C) (02/25 0521) Pulse Rate:  [52-55] 52 (02/25 0521) Resp:  [20] 20 (02/24 1948) BP: (94-121)/(49-63) 121/63 (02/25 0915) SpO2:  [96 %-98 %] 96 % (02/25 1344) Last BM Date: 01/28/20  Weight change: Filed Weights   01/23/20 0500 01/26/20 0453 01/27/20 0452  Weight: 47.9 kg 48.8 kg 48.7 kg    Intake/Output:   Intake/Output Summary (Last 24 hours) at 01/28/2020 1420 Last data filed at 01/28/2020 9628 Gross per 24 hour  Intake 480 ml  Output 300 ml  Net 180 ml      Physical Exam   General: thin elderly male, NAD Neck: No JVD, no thyromegaly or thyroid nodule.  Lungs: Clear to auscultation bilaterally with normal respiratory effort. CV: Nondisplaced PMI.  Heart regular S1/S2, no S3/S4, 2/6 murmur LLSB/apex   Abdomen: Soft, nontender, no hepatosplenomegaly, no distention.  Skin: Intact without lesions or rashes.  Neurologic: Alert and oriented x 3.  Psych: Normal affect. Extremities: No clubbing or cyanosis.  HEENT: Normal.    Labs    CBC No results  for input(s): WBC, NEUTROABS, HGB, HCT, MCV, PLT in the last 72 hours. Basic Metabolic Panel Recent Labs    01/27/20 0700 01/28/20 0614  NA 141 141  K 4.2 4.9  CL 107 110  CO2 25 22  GLUCOSE 101* 91  BUN 26* 29*  CREATININE 1.39* 1.30*  CALCIUM 9.1 8.7*   Liver Function Tests No results for input(s): AST, ALT, ALKPHOS, BILITOT, PROT, ALBUMIN in the last 72 hours. No results for input(s): LIPASE, AMYLASE in the last 72 hours. Cardiac Enzymes No results for input(s): CKTOTAL, CKMB, CKMBINDEX, TROPONINI in the last 72 hours.  BNP: BNP (last 3 results) Recent Labs    01/25/20 1649  BNP 428.1*    ProBNP (last 3 results) No results for input(s): PROBNP in the last 8760 hours.   D-Dimer No results for input(s): DDIMER in the last 72 hours. Hemoglobin A1C No results for input(s): HGBA1C in the last 72 hours. Fasting Lipid Panel No results for input(s): CHOL, HDL, LDLCALC, TRIG, CHOLHDL, LDLDIRECT in the last 72 hours. Thyroid Function Tests No results for input(s): TSH, T4TOTAL, T3FREE, THYROIDAB in the last 72 hours.  Invalid input(s): FREET3  Other results:   Imaging    No results found.   Medications:     Scheduled Medications: . amiodarone  200 mg Oral Daily  . apixaban  2.5 mg Oral Q12H  . aspirin  81 mg Oral Daily  . atorvastatin  80 mg Oral q1800  . carvedilol  3.125  mg Oral BID WC  . chlorhexidine  15 mL Mouth/Throat BID  . digoxin  0.0625 mg Oral Daily  . feeding supplement (ENSURE ENLIVE)  237 mL Oral TID BM  . furosemide  20 mg Oral Daily  . levalbuterol  0.63 mg Nebulization TID  . losartan  12.5 mg Oral QHS  . polycarbophil  1,250 mg Oral Daily  . [START ON 01/29/2020] potassium chloride  10 mEq Oral Daily  . spironolactone  12.5 mg Oral QHS  . traZODone  50 mg Oral QHS    Infusions:   PRN Medications: acetaminophen, alum & mag hydroxide-simeth, bisacodyl, diphenhydrAMINE, guaiFENesin-dextromethorphan, levalbuterol, Melatonin,  oxyCODONE, polyethylene glycol, prochlorperazine **OR** prochlorperazine **OR** prochlorperazine, sodium phosphate   Assessment/Plan   1. CAD: STEMI with very short LM and 99% ostial LAD. PCI thought to risk compromise of ostial large LCx, so decision made to defer PCI and transfer to [facility] for CABG as patient was CP-free at that point. Now s/p CABG with LIMA-LAD, SVG-OM2, SVG-LPDA.  No chest pain.  - Continue ASA.  - Continue atorvastatin 80 mg daily 2. Chronic systolic CHF: Ischemic cardiomyopathy.  Pre-op echo with EF 35%.  Now s/p CABG-AVR.  Echo post-op with EF 30%, RV dysfunction. Milrinone discontinued.  He continues to report dyspnea, but he does not appear volume overloaded on exam.  CXR was not impressive.  - With orthostasis, would keep Lasix at 20 mg daily.    - Continue spironolactone 12.5 daily, change dosing time to qhs.   - Continue Coreg 3.125 mg bid.  - Continue digoxin 0.0625 daily, dig level ok 0.7 (2/23) - Continue losartan 12.5 mg daily, change dosing time to qhs.   3. Atrial fibrillation: S/p Maze, LA appendage clip.  Amiodarone was stopped 2/22, but now with SVT runs (?atrial fibrillation) and accelerated junctional rhythm on ECG 2/24   - continue amiodarone at 200 mg daily - Continue eliquis 2.5 mg twice a day. Lower dose with age and weight.  4. Hyperlipidemia: LDL 187. Goal <70.  - Treat w/ atorvastatin 80 qhs.  - Repeat FLP + HFTs in 6 weeks. If not at goal will need addition of Zetia +/- PCSK9i  5. Thrombocytopenia: Post-op, trending up. Platelets stable.  6. Left pleural effusion: 01/14/20 S/P Left thoracentesis 1.4 liters.  Repeat CXR 2/22 showed improving left pleural effusion and stable small rt pleural effusion.  7. AKI: Creatinine stable 1.30 c/w baseline. 8. Delirium: Resolved.  9. ID: He completed course of cefepime in CIR.  10. Elevated LFTs: ?Shock liver from hypotensive episode immediately post-op. LFTs trended down.  RUQ US unremarkable.  11.  Deconditioning.  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  CRP up but not ESR.   - Completed 2 wk course of colchicine.   Length of Stay: [address], PA-C  01/28/2020, 2:20 PM  Advanced Heart Failure Team Pager [phone] (M-F; 7a - 4p)  Please contact [facility] for night-coverage after hours (4p -7a ) and weekends on [email or web address]  Patient seen with PA, agree with the above note.  Not much change clinically, creatinine stable at 1.3.  SBP 90s-110s, HR 50s-60s.  Would not make changes today.  Continue to mobilize with PT.   Loralie Champagne 01/28/2020

## 2020-01-28 NOTE — NC FL2 (Signed)
[place] MEDICAID FL2 LEVEL OF CARE SCREENING TOOL     IDENTIFICATION  Patient Name: David Perez Birthdate: [birth year] Sex: male Admission Date (Current Location): 01/08/2020  [facility] and [place] Number:  Engineering geologist and Address:  [facility] [place]. [facility], [place] [address], [place], [place] [address]      Provider Number: [id number]  Attending Physician Name and Address:  Courtney Heys, MD  Relative Name and Phone Number:  Geni Bers [phone]    Current Level of Care: Hospital Recommended Level of Care: [place] Prior Approval Number:    Date Approved/Denied:   PASRR Number: [id number]  Discharge Plan: SNF    Current Diagnoses: Patient Active Problem List   Diagnosis Date Noted  . CHF (congestive heart failure) ([place])   . Leukocytosis   . Anemia   . Acute systolic congestive heart failure ([place])   . AKI (acute kidney injury) ([place])   . SOB (shortness of breath)   . Acute on chronic systolic CHF (congestive heart failure) ([place])   . Debility 01/08/2020  . STEMI involving left main coronary artery ([place]) 12/31/2019  . STEMI (ST elevation myocardial infarction) ([place]) 12/31/2019  . 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    Orientation RESPIRATION BLADDER Height & Weight     Self, Time, Situation, Place  Normal Continent Weight: 107 lb 5.8 oz (48.7 kg) Height:  5\' 8"  (172.7 cm)  BEHAVIORAL SYMPTOMS/MOOD NEUROLOGICAL BOWEL NUTRITION STATUS      Continent    AMBULATORY STATUS COMMUNICATION OF NEEDS Skin   Supervision Verbally Normal                       Personal Care Assistance Level of Assistance  Bathing Bathing Assistance: Limited assistance         Functional Limitations Info             SPECIAL CARE FACTORS FREQUENCY  PT (By licensed PT), OT (By licensed OT)     PT Frequency: 5xs per week OT Frequency: 5xs per week            Contractures      Additional Factors Info                  Current  Medications (01/28/2020):  This is the current hospital active medication list Current Facility-Administered Medications  Medication Dose Route Frequency Provider Last Rate Last Admin  . acetaminophen (TYLENOL) tablet 325-650 mg  325-650 mg Oral Q4H PRN Bary Leriche, PA-C   650 mg at 01/24/20 0843  . alum & mag hydroxide-simeth (MAALOX/MYLANTA) 200-200-20 MG/5ML suspension 30 mL  30 mL Oral Q4H PRN Love, Pamela S, PA-C      . amiodarone (PACERONE) tablet 200 mg  200 mg Oral Daily Bary Leriche, PA-C   200 mg at 01/28/20 [id number]  . apixaban (ELIQUIS) tablet 2.5 mg  2.5 mg Oral Q12H Love, Ivan Anchors, PA-C   2.5 mg at 01/28/20 [id number]  . aspirin chewable tablet 81 mg  81 mg Oral Daily Bary Leriche, PA-C   81 mg at 01/28/20 [id number]  . atorvastatin (LIPITOR) tablet 80 mg  80 mg Oral q1800 Bary Leriche, PA-C   80 mg at 01/27/20 1751  . bisacodyl (DULCOLAX) suppository 10 mg  10 mg Rectal Daily PRN Love, Pamela S, PA-C      . carvedilol (COREG) tablet 3.125 mg  3.125 mg Oral BID WC Love, Pamela S, PA-C   3.125  mg at 01/28/20 0824  . chlorhexidine (PERIDEX) 0.12 % solution 15 mL  15 mL Mouth/Throat BID Lovorn, Megan, MD   15 mL at 01/28/20 0824  . digoxin (LANOXIN) tablet 0.0625 mg  0.0625 mg Oral Daily Bary Leriche, PA-C   0.0625 mg at 01/28/20 0825  . diphenhydrAMINE (BENADRYL) 12.5 MG/5ML elixir 12.5-25 mg  12.5-25 mg Oral Q6H PRN Love, Pamela S, PA-C      . feeding supplement (ENSURE ENLIVE) (ENSURE ENLIVE) liquid 237 mL  237 mL Oral TID BM LoveIvan Anchors, PA-C   237 mL at 01/28/20 0948  . furosemide (LASIX) tablet 20 mg  20 mg Oral Daily Larey Dresser, MD   20 mg at 01/28/20 [id number]  . guaiFENesin-dextromethorphan (ROBITUSSIN DM) 100-10 MG/5ML syrup 5-10 mL  5-10 mL Oral Q6H PRN Love, Pamela S, PA-C      . levalbuterol (XOPENEX) nebulizer solution 0.63 mg  0.63 mg Nebulization TID Lovorn, Jinny Blossom, MD   0.63 mg at 01/28/20 1344  . levalbuterol (XOPENEX) nebulizer solution 0.63 mg  0.63 mg Nebulization Q6H  PRN Lovorn, Megan, MD      . losartan (COZAAR) tablet 12.5 mg  12.5 mg Oral QHS Larey Dresser, MD      . Melatonin TABS 6 mg  6 mg Oral QHS PRN Courtney Heys, MD   6 mg at 01/25/20 2057  . oxyCODONE (Oxy IR/ROXICODONE) immediate release tablet 5-10 mg  5-10 mg Oral Q6H PRN Love, Pamela S, PA-C      . polycarbophil (FIBERCON) tablet 1,250 mg  1,250 mg Oral Daily Lovorn, Megan, MD   1,250 mg at 01/28/20 0829  . polyethylene glycol (MIRALAX / GLYCOLAX) packet 17 g  17 g Oral Daily PRN Love, Pamela S, PA-C      . [START ON 01/29/2020] potassium chloride (KLOR-CON) CR tablet 10 mEq  10 mEq Oral Daily Lovorn, Megan, MD      . prochlorperazine (COMPAZINE) tablet 5-10 mg  5-10 mg Oral Q6H PRN Love, Pamela S, PA-C       Or  . prochlorperazine (COMPAZINE) injection 5-10 mg  5-10 mg Intramuscular Q6H PRN Love, Pamela S, PA-C       Or  . prochlorperazine (COMPAZINE) suppository 12.5 mg  12.5 mg Rectal Q6H PRN Love, Pamela S, PA-C      . sodium phosphate (FLEET) 7-19 GM/118ML enema 1 enema  1 enema Rectal Once PRN Love, Pamela S, PA-C      . spironolactone (ALDACTONE) tablet 12.5 mg  12.5 mg Oral QHS Larey Dresser, MD      . traZODone (DESYREL) tablet 50 mg  50 mg Oral QHS Lovorn, Jinny Blossom, MD   50 mg at 01/27/20 2035     Discharge Medications: Please see discharge summary for a list of discharge medications.  Relevant Imaging Results:  Relevant Lab Results:   Additional Information    Rana Snare, LCSW

## 2020-01-28 NOTE — Progress Notes (Signed)
Occupational Therapy Session Note  Patient Details  Name: David Perez MRN: [id number] Date of Birth: 04/29/1941  Today's Date: 01/28/2020 OT Individual Time: [id number] OT Individual Time Calculation (min): 44 min   Skilled Therapeutic Interventions/Progress Updates:   Pt greeted semi-reclined in bed asleep, easy to wake, and agreeable to OT treatment session. Pt completed bed mobility with supervision. Pt then completed stand-pivot to wc w/ supervision and good recall of sternal precaution technique for sit<>stand. Pt brought down to therapy gym and completed LB there-ex, 3 sets of 10-seated hip adduction/abduction using orange thera-band and ball to squeeze between legs, and seated hip extension. Pt needed extended rest breaks in between sets and at times during the middle of exercises 2/2 SOB. Pt reported SOB had improved, but he wanted to take his time in between exercises. OT discussed energy conservation techniques within home management and BADL tasks. Pt returned to room at end of session and agreeable to stay up in wc. Pt left with alarm belt on, call bell in reach, and nurse tech to take vitals.    Therapy Documentation Precautions:  Precautions Precautions: Sternal, Fall Precaution Comments: Reviewed sternal precautions Restrictions Weight Bearing Restrictions: No Other Position/Activity Restrictions: sternal precautions Pain:  none denies/pain   Therapy/Group: Individual Therapy  Valma Cava 01/28/2020, 3:11 PM

## 2020-01-28 NOTE — Progress Notes (Signed)
[facility] PHYSICAL MEDICINE & REHABILITATION PROGRESS NOTE   Subjective/Complaints:  Pt reports just woke up- not tired, which is new, this AM.   Nurse just measured his pulse- was 56.   Pt reports feeling a little better this AM- still stable SOB.     ROS: (+) DOE, no CP, no N/V/C/D Objective:   No results found. No results for input(s): WBC, HGB, HCT, PLT in the last 72 hours. Recent Labs    01/27/20 0700 01/28/20 0614  NA 141 141  K 4.2 4.9  CL 107 110  CO2 25 22  GLUCOSE 101* 91  BUN 26* 29*  CREATININE 1.39* 1.30*  CALCIUM 9.1 8.7*    Intake/Output Summary (Last 24 hours) at 01/28/2020 1142 Last data filed at 01/28/2020 [id number] Gross per 24 hour  Intake 840 ml  Output 300 ml  Net 540 ml     Physical Exam:  Reviewed labs and vitals- also spoke with nurse - pt's pulse 56- but we both agreed no Afib or unusual rhythms.   Vital Signs Blood pressure 121/63, pulse (!) 52, temperature 97.7 F (36.5 C), temperature source Oral, resp. rate 20, height 5\' 8"  (1.727 m), weight 48.7 kg, SpO2 97 %. Constitutional:  Pt awake, laying sprawled out in bed; RN at bedside, NAD; appears more comfortable today HENT: Normocephalic.  Atraumatic. Eyes: conjuagte gaze Cardiovascular:  Listened for 1 full minute- rates in 50s, so bradycardic, but regular rhythm, no Afib Respiratory: improved breath sounds on exam; CTA B/L-  GI: ccachetic, very thin; (+)BS soft, NT, ND Skin: Warm and dry.  Intact. CABG incision healed Psych: still anxious Musc: No edema in extremities.  No tenderness in extremities. Neuro:  Alert Motor: 5/5 throughout  Assessment/Plan: 1. 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  Physiatrist is providing close team supervision and 24 hour management of active medical problems listed below.  Physiatrist and rehab team continue to assess  barriers to discharge/monitor patient progress toward functional and medical goals  Care Tool:  Bathing    Body parts bathed by patient: Right arm, Left arm, Chest, Abdomen, Front perineal area, Buttocks, Right upper leg, Left upper leg, Right lower leg, Left lower leg, Face   Body parts bathed by helper: Chest, Abdomen, Right lower leg, Left lower leg     Bathing assist Assist Level: Supervision/Verbal cueing     Upper Body Dressing/Undressing Upper body dressing Upper body dressing/undressing activity did not occur (including orthotics): N/A What is the patient wearing?: Pull over shirt    Upper body assist Assist Level: Set up assist    Lower Body Dressing/Undressing Lower body dressing      What is the patient wearing?: Pants     Lower body assist Assist for lower body dressing: Supervision/Verbal cueing     Toileting Toileting    Toileting assist Assist for toileting: Supervision/Verbal cueing     Transfers Chair/bed transfer  Transfers assist     Chair/bed transfer assist level: Contact Guard/Touching assist Chair/bed transfer assistive device: Programmer, multimedia   Ambulation assist      Assist level: Supervision/Verbal cueing Assistive device: Walker-rolling Max distance: 50'   Walk 10 feet activity   Assist     Assist level: Supervision/Verbal cueing Assistive device: Walker-rolling   Walk 50 feet activity   Assist    Assist level: Supervision/Verbal cueing Assistive device: Walker-rolling  Walk 150 feet activity   Assist Walk 150 feet activity did not occur: Safety/medical concerns(decreased activity tolerance, SOB)  Assist level: Supervision/Verbal cueing Assistive device: Walker-rolling    Walk 10 feet on uneven surface  activity   Assist     Assist level: Contact Guard/Touching assist Assistive device: Walker-rolling   Wheelchair     Assist Will patient use wheelchair at discharge?: Yes Type  of Wheelchair: Manual    Wheelchair assist level: Independent Max wheelchair distance: 16'    Wheelchair 50 feet with 2 turns activity    Assist    Wheelchair 50 feet with 2 turns activity did not occur: Safety/medical concerns(LE only due to sternal precautions, decreased activity tolerance)       Wheelchair 150 feet activity     Assist  Wheelchair 150 feet activity did not occur: Safety/medical concerns(LE only due to sternal precautions, decreased activity tolerance)       Blood pressure 121/63, pulse (!) 52, temperature 97.7 F (36.5 C), temperature source Oral, resp. rate 20, height 5\' 8"  (1.727 m), weight 48.7 kg, SpO2 97 %.  Medical Problem List and Plan: 1.  Impaired mobility and ADLs secondary to cardiac debility s/p CABGx3.             Completed 2-week course of colchicine for pericardial irritation  Continue CIR  2.  Antithrombotics: -DVT/anticoagulation: Starting Eliquis 2.5 mg BID             -antiplatelet therapy: On ASA 3. Pain Management: Denies pain, not receiving any pain medications.   Lidoderm patch DCed due to lack of benefit. 4. Mood: LCSW to follow for evaluations and support.              -antipsychotic agents:  N/A 5. Neuropsych: This patient is capable of making decisions on his own behalf. 6. Skin/Wound Care: Monitor incisions for healing.  7. Fluids/Electrolytes/Nutrition: Offer supplements between meals to promote wound healing.  8. AFib: Continue to monitor HR and with increased in activity. Continue Amiodarone bid  Mildly bradycardic on 2/21, may consider decreasing dose of amiodarone  Will attempt to reach out to therapies regarding heart rate during therapies  ECG reviewed, showing sinus bradycardia  2/22- called Cards consult to assess bradycardia to see if can reduce amiodarone, etc  2/23- Cards stopped Amiodarone- no Afib; bradycardia still notable. CXR shows improving L pleural effusion and stable small R pleural effusion- no  pulm edema.  2/24- Amiodarone restarted due to EKG results with faster junctional rhythm per Cards- also changed Aldactone to night time.   2/25- bradycardic but regular rhythm again- will monitor- for orthostasis and Afib-   9. Persistent Leucocytosis/pneumonia:   Thoracentesis performed by IR on 2/11, positive WBCs, no growth  Completed course of cefepime  WBC 13.0 on 2/18, labs ordered for tomorrow  Con't nebs q6 hours scheduled, if doesn't get better, might benefit from some PO steroids.     SOB stable on 2/21  2/22- stable SOB/DOE, but now only walking 30 ft vs 323ft- will consult IM to see if can assist; if bradycardia is not only cause of Sx's.   2/23- IM signed off- feels it's cardiac. Waiting for cards to weigh in.  10.  Acute kidney injury:   Aldactone 12.5 mg daily along with Lasix 20 mEq daily  Creatinine 1.48 on 2/20, labs ordered for tomorrow  2/22- Cr stable at 1.48- con't treatment  2/23- Cr stable and BUN down to 29 from 34- better  2/24- Cr  1.39- about the same and BUN slightly improved at 26  Encourage fluids but stick to 1.5L daily.  2/25- Cr 1.30- improved slightly- BUN back to 29  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 On ASA, Losartan, digoxin, coreg, Lipitor.   Filed Weights   01/23/20 0500 01/26/20 0453 01/27/20 0452  Weight: 47.9 kg 48.8 kg 48.7 kg   Stable on 2/20, no repeat weights today  2/22- Stable at 47.9 kg today  2/23- Weight 48.8  2/24- weight stable- not fluid overloaded 12. Thrombocytopenia: Has resolved.  13. Shortness of breath: Encourage IS. Continue   See #9  2/22- see #9' of note, WBC down to 9.1 finally; and on Xopenex scheduled q6 hours 14. Constipation: Resolved. Can DC daily docusate, miralax, and dulcolax. Patient is refusing and moving bowels regularly.  15. Hypernatremia: Resolved 16.  Sleep disturbance  Improving  17.  Disposition: Lives alone, but has supportive family, including daughter,  nearby.  18.  Anemia  Hemoglobin 10.3 on 2/18, labs ordered for tomorrow  2/22- Hb stable at 10.5- con't to monitor and con't Iron to improve Hb  Continue to monitor 19. Borderline K+ hyperkalemia  2/25- K+ 4.9- will reduce KCL to 10 mEq daily and monitor 20. Dispo  2/22- was planned ot d/c tomorrow, but pt hasn't improved his level of function- now 30 ft Supervision and was 300 ft mod I- asked IM to assist/second opinion- might not d/c tomorrow.  2/23- will hold onto until feeling somewhat better- hopefully in 1-2 days- has to go home Mod I.    2/24- Amiodarone restarted- hope is he will be stable by Friday for d/c.    LOS: 20 days A FACE TO FACE EVALUATION WAS PERFORMED  David Perez 01/28/2020, 11:42 AM

## 2020-01-28 NOTE — Progress Notes (Signed)
Physical Therapy Session Note  Patient Details  Name: David Perez MRN: [id number] Date of Birth: 12-01-1941  Today's Date: 01/28/2020 PT Individual Time: 0855-0950 PT Individual Time Calculation (min): 55 min   Short Term Goals: Week 2:  PT Short Term Goal 1 (Week 2): STG=LTG due to ELOS. Week 3:  PT Short Term Goal 1 (Week 3): STG=LTG due to ELOS.  Skilled Therapeutic Interventions/Progress Updates:     Patient sitting EOB upon PT arrival. Patient alert and agreeable to PT session. Patient denied pain during session, however, reported pain from L LE ACE wraps yesterday evening. On observation, new bruseing on posterior calf. RN made aware and per discussion with RN with don TEDS with OOB and leave ACE wraps off to maintain skin integrity. Donned B thigh high TEDs with total A at beginning of session.   Patient was orthostatic when vitals were assess, see flowsheet. [name], PA informed and advised to continue with mobility based on patient symptoms.   Therapeutic Activity: Bed Mobility: Patient performed supine to/from sit with CGA for trunk support to sit-mod I.  Transfers: Patient performed sit to/from stand x6 with supervision for safety using a RW.   Gait Training:  Patient ambulated 10-25 feet in the ADL apartment to simulate household ambulation with supervision for safety using a RW. Ambulated with decreased gait speed, decreased step length and height, increased B hip and knee flexion in stance, and mild forward trunk lean.   Wheelchair Mobility:  Patient was transported in the w/c with total A throughout session for energy conservation, maintaining sternal precautions, and time management.  Therapeutic Exercise: Patient performed the following exercises with verbal and tactile cues for proper technique. -sit to/from stands x5 -B seated hip abduction with orange resistance band 2x10 -Alternating marching in sitting with orange resistance band 2x10  Patient in w/c in room at end  of session with breaks locked and all needs within reach. Patient maintained sternal precautions throughout session with all mobility without cues. He continues to present with SOB and fatigue with minimal activity and requires frequent rest breaks between activities to recover. Provided cues for pursed lip breathing throughout session. Educated patient on energy conservation techniques, monitoring his symptoms with mobility and sitting or lying down with symptom onset, and suggested to have a place to sit every 10-15 feet for safety and to manage fatigue. Patient stated understanding. He continues to be concerned about his SOB and fatigue, however, stated that he is feeling better about managing his symptoms.   Therapy Documentation Precautions:  Precautions Precautions: Sternal, Fall Precaution Comments: Reviewed sternal precautions Restrictions Weight Bearing Restrictions: No Other Position/Activity Restrictions: sternal precautions General:   Vital Signs: Orthostatic Vitals: Lying: BP: 141/69, HR: 62, SPO2: 98% Sitting: 121/60, HR 62, SPO2 100% Standing: 83/56, HR 70, SPO2 99% with SOB, fatigue, and urgency to sit in <1 min in standing    Therapy/Group: Individual Therapy  Kion Huntsberry L Ranell Finelli PT, DPT  01/28/2020, 12:22 PM

## 2020-01-29 ENCOUNTER — Inpatient Hospital Stay (HOSPITAL_COMMUNITY): Payer: Medicare Other | Admitting: Occupational Therapy

## 2020-01-29 ENCOUNTER — Inpatient Hospital Stay (HOSPITAL_COMMUNITY): Payer: Medicare Other | Admitting: Physical Therapy

## 2020-01-29 LAB — BASIC METABOLIC PANEL
Anion gap: 7 (ref 5–15)
BUN: 28 mg/dL — ABNORMAL HIGH (ref 8–23)
CO2: 25 mmol/L (ref 22–32)
Calcium: 8.6 mg/dL — ABNORMAL LOW (ref 8.9–10.3)
Chloride: 107 mmol/L (ref 98–111)
Creatinine, Ser: 1.55 mg/dL — ABNORMAL HIGH (ref 0.61–1.24)
GFR calc Af Amer: 48 mL/min — ABNORMAL LOW (ref >60)
GFR calc non Af Amer: 41 mL/min — ABNORMAL LOW (ref >60)
Glucose, Bld: 107 mg/dL — ABNORMAL HIGH (ref 70–99)
Potassium: 4.6 mmol/L (ref 3.5–5.1)
Sodium: 139 mmol/L (ref 135–145)

## 2020-01-29 MED ORDER — DIGOXIN 62.5 MCG PO TABS: 0.0625 mg | ORAL_TABLET | Freq: Every day | ORAL | 1 refills | Status: DC

## 2020-01-29 MED ORDER — TRAZODONE HCL 50 MG PO TABS: 25.0000 mg | ORAL_TABLET | Freq: Every evening | ORAL | 0 refills | Status: DC | PRN

## 2020-01-29 MED ORDER — SPIRONOLACTONE 25 MG PO TABS: 12.5000 mg | ORAL_TABLET | Freq: Every day | ORAL | 0 refills | Status: DC

## 2020-01-29 MED ORDER — ATORVASTATIN CALCIUM 80 MG PO TABS: 80.0000 mg | ORAL_TABLET | Freq: Every day | ORAL | 1 refills | Status: DC

## 2020-01-29 MED ORDER — CARVEDILOL 3.125 MG PO TABS: 3.1250 mg | ORAL_TABLET | Freq: Two times a day (BID) | ORAL | 1 refills | Status: DC

## 2020-01-29 MED ORDER — MELATONIN 3 MG PO TABS: 6.0000 mg | ORAL_TABLET | Freq: Every day | ORAL | 0 refills | Status: DC

## 2020-01-29 MED ORDER — APIXABAN 2.5 MG PO TABS: 2.5000 mg | ORAL_TABLET | Freq: Two times a day (BID) | ORAL | 1 refills | Status: DC

## 2020-01-29 MED ORDER — POTASSIUM CHLORIDE CRYS ER 10 MEQ PO TBCR: 10.0000 meq | EXTENDED_RELEASE_TABLET | Freq: Every day | ORAL | 1 refills | Status: DC

## 2020-01-29 MED ORDER — FUROSEMIDE 20 MG PO TABS: 20.0000 mg | ORAL_TABLET | Freq: Every day | ORAL | 1 refills | Status: DC

## 2020-01-29 MED ORDER — CALCIUM POLYCARBOPHIL 625 MG PO TABS: 1250.0000 mg | ORAL_TABLET | Freq: Every day | ORAL | 0 refills | Status: DC

## 2020-01-29 MED ORDER — LOSARTAN POTASSIUM 25 MG PO TABS: 12.5000 mg | ORAL_TABLET | Freq: Every day | ORAL | 0 refills | Status: DC

## 2020-01-29 MED ORDER — ACETAMINOPHEN 325 MG PO TABS: 325.0000 mg | ORAL_TABLET | ORAL | Status: DC | PRN

## 2020-01-29 MED ORDER — AMIODARONE HCL 200 MG PO TABS: 200.0000 mg | ORAL_TABLET | Freq: Every day | ORAL | 1 refills | Status: DC

## 2020-01-29 NOTE — Progress Notes (Signed)
Pt eating well, encouraged to drink more fluids today. Pt voiced concern with difficulty of eating and drinking fluids due to fullness at times.

## 2020-01-29 NOTE — Progress Notes (Signed)
Social Work Discharge Note   The overall goal for the admission was met for:   Discharge location: Yes. Discharge to his daughter's home- [name]  Length of Stay: Yes. 21 days.  Discharge activity level: Yes. 24/7 Supervision due to medical concerns (Afib, SOB,limited endurance)  Home/community participation: Yes. Limited  Services provided included: MD, RD, PT, OT, SLP, RN, CM, TR, Pharmacy, Neuropsych and SW  Financial Services: Medicare  Follow-up services arranged: Home Health: [facility] [phone] for HHPT/OT/SN  Comments (or additional information): Pt dtr [name] [phone]  Patient/Family verbalized understanding of follow-up arrangements: Yes  Individual responsible for coordination of the follow-up plan: Pt and pt dtr to coordinate care needs.   Confirmed correct DME delivered: Rana Snare 01/29/2020    Loralee Pacas, MSW, [place] Office: [phone] Cell: [phone] Fax: [phone]

## 2020-01-29 NOTE — Discharge Instructions (Signed)
Inpatient Rehab Discharge Instructions  David Perez Discharge date and time: 01/29/20   Activities/Precautions/ Functional Status: Activity: No lifting anything over 5 lbs, driving, or strenuous exercise till cleared by MD. Continue sternal precautions.  Diet: cardiac diet Wound Care: keep wound clean and dry    Functional status:  ___ No restrictions     ___ Walk up steps independently _X__ 24/7 supervision/assistance   ___ Walk up steps with assistance ___ Intermittent supervision/assistance  ___ Bathe/dress independently ___ Walk with walker     ___ Bathe/dress with assistance ___ Walk Independently    ___ Shower independently ___ Walk with assistance    _X__ Shower with assistance _X__ No alcohol     ___ Return to work/school ________    COMMUNITY REFERRALS UPON DISCHARGE:    Home Health:   PT     OT     RN                    Agency:  [facility] Phone: [phone]   Medical Equipment/Items Ordered:  Wheelchair, cushion, walker, 3n1 commode and tub bench                                                      Agency/Supplier:  [facility] @ [phone]  Special Instructions:    My questions have been answered and I understand these instructions. I will adhere to these goals and the provided educational materials after my discharge from the hospital.  Patient/Caregiver Signature _______________________________ Date __________  Clinician Signature _______________________________________ Date __________  Please bring this form and your medication list with you to all your follow-up doctor's appointments.   Information on my medicine - ELIQUIS (apixaban)  This medication education was reviewed with me or my healthcare representative as part of my discharge preparation.    Why was Eliquis prescribed for you? Eliquis was prescribed for you to reduce the risk of forming blood clots that can cause a stroke if you have a medical condition called atrial  fibrillation (a type of irregular heartbeat) OR to reduce the risk of a blood clots forming after orthopedic surgery.  What do You need to know about Eliquis ? Take your Eliquis TWICE DAILY - one tablet in the morning and one tablet in the evening with or without food.  It would be best to take the doses about the same time each day.  If you have difficulty swallowing the tablet whole please discuss with your pharmacist how to take the medication safely.  Take Eliquis exactly as prescribed by your doctor and DO NOT stop taking Eliquis without talking to the doctor who prescribed the medication.  Stopping may increase your risk of developing a new clot or stroke.  Refill your prescription before you run out.  After discharge, you should have regular check-up appointments with your healthcare provider that is prescribing your Eliquis.  In the future your dose may need to be changed if your kidney function or weight changes by a significant amount or as you get older.  What do you do if you miss a dose? If you miss a dose, take it as soon as you remember on the same day and resume taking twice daily.  Do not take more than one dose of ELIQUIS at the same time.  Important Safety Information A  possible side effect of Eliquis is bleeding. You should call your healthcare provider right away if you experience any of the following: ? Bleeding from an injury or your nose that does not stop. ? Unusual colored urine (red or dark brown) or unusual colored stools (red or black). ? Unusual bruising for unknown reasons. ? A serious fall or if you hit your head (even if there is no bleeding).  Some medicines may interact with Eliquis and might increase your risk of bleeding or clotting while on Eliquis. To help avoid this, consult your healthcare provider or pharmacist prior to using any new prescription or non-prescription medications, including herbals, vitamins, non-steroidal anti-inflammatory drugs  (NSAIDs) and supplements.  This website has more information on Eliquis (apixaban): [email or web address].

## 2020-01-29 NOTE — Discharge Summary (Signed)
Physician Discharge Summary  Patient ID: Maxi Vail MRN: [id number] DOB/AGE: 01-30-1941 79 y.o.  Admit date: 01/08/2020 Discharge date: 01/29/2020  Discharge Diagnoses:  Principal Problem:   Debility Active Problems:   S/P CABG x 3   Acute on chronic systolic CHF (congestive heart failure) (HCC)   Leukocytosis   Anemia   SOB (shortness of breath)   Pleural effusion   Discharged Condition: stable   Significant Diagnostic Studies:  DG Chest 2 View  Result Date: 01/25/2020 CLINICAL DATA:  CHF. 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 Improving left pleural effusion from prior exams. Small right pleural effusion is not significantly changed. Chronic hyperinflation. No pulmonary edema or new airspace disease. No pneumothorax. Exaggerated thoracic kyphosis with multilevel degenerative change in the spine. IMPRESSION: Improving left pleural effusion. Stable small right pleural effusion. Electronically Signed   By: Keith Rake M.D.   On: 01/25/2020 19:29   DG Chest 2 View  Result Date: 01/20/2020 CLINICAL DATA:  CHF EXAM: CHEST - 2 VIEW COMPARISON:  01/18/2020 FINDINGS: Mild cardiomegaly. Prior CABG and valve replacement. Small left pleural effusion with left base atelectasis. There is hyperinflation of the lungs compatible with COPD. No overt edema. No acute bony abnormality. IMPRESSION: COPD, cardiomegaly. Small left pleural effusion with left base atelectasis. Electronically Signed   By: Rolm Baptise M.D.   On: 01/20/2020 12:27   DG Chest 2 View  Result Date: 01/18/2020 CLINICAL DATA:  Left-sided pleural effusion. EXAM: CHEST - 2 VIEW COMPARISON:  01/14/2020 FINDINGS: Grossly unchanged cardiac silhouette and mediastinal contours post median sternotomy, CABG and valve repair. Interval increase in small right and moderate-sized left-sided pleural effusion with associated worsening bibasilar  heterogeneous/consolidative opacities. The lungs remain hyperexpanded. Pulmonary vasculature appears slightly less distinct than present examination. No pneumothorax. No acute osseous abnormalities. Accentuated thoracic kyphosis. IMPRESSION: Suspected worsening pulmonary edema superimposed on advanced emphysematous change with small right and moderate-sized left pleural effusions and associated bibasilar opacities, likely atelectasis. Electronically Signed   By: Sandi Mariscal M.D.   On: 01/18/2020 07:26   DG Chest 2 View  Result Date: 01/14/2020 CLINICAL DATA:  Status post left thoracentesis EXAM: CHEST - 2 VIEW COMPARISON:  01/12/2020 FINDINGS: Cardiac shadow is enlarged. Postsurgical changes are noted. 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 Significant reduction in left-sided pleural effusion is noted following thoracentesis. No pneumothorax is noted. Improved vascular congestion is seen when compared with the prior study. IMPRESSION: No pneumothorax following left thoracentesis. Electronically Signed   By: Inez Catalina M.D.   On: 01/14/2020 14:30   DG Chest 2 View  Result Date: 01/07/2020 CLINICAL DATA:  Recent coronary artery bypass surgery, prior abnormal chest x-ray EXAM: CHEST - 2 VIEW COMPARISON:  01/05/2020 FINDINGS: Frontal and lateral views of the chest demonstrate interval removal of the left subclavian catheter and bilateral chest tubes. Epicardial pacing wires remain, a contraindication to MRI. Postsurgical changes from median sternotomy and aortic valve replacement. Cardiac silhouette is enlarged but stable. Overall, improved volume status with decreased pleural effusions, resolution of ground-glass airspace disease, and decreased central vascular congestion. No evidence pneumothorax. Minimal subcutaneous gas within the right lateral chest wall related to previous chest tube. IMPRESSION: 1. Interval removal of left subclavian catheter and bilateral chest tubes. No evidence of pneumothorax.  2. Improved volume status with decreased pleural effusions and decreased central vascular congestion. Electronically Signed   By: Randa Ngo M.D.   On: 01/07/2020 10:38  CT CHEST W CONTRAST  Result Date: 01/14/2020 CLINICAL DATA:  Inpatient. Leukocytosis. Persistent cough. Intermittent right chest wall pain. History of aortic valve replacement, CABG and atrial appendage clipping. EXAM: CT CHEST WITH CONTRAST TECHNIQUE: Multidetector CT imaging of the chest was performed during intravenous contrast administration. CONTRAST:  95mL OMNIPAQUE IOHEXOL 300 MG/ML  SOLN COMPARISON:  01/12/2020 chest radiograph. 12/31/2019 chest CT. FINDINGS: Cardiovascular: Mild cardiomegaly, increased from prior chest CT. No significant pericardial effusion/thickening. Aortic valve prosthesis in place. 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 Atherosclerotic nonaneurysmal thoracic aorta. Normal caliber main pulmonary artery. No central pulmonary emboli. Mediastinum/Nodes: No discrete thyroid nodules. Unremarkable esophagus. No pathologically enlarged axillary, mediastinal or hilar lymph nodes. There a few scattered small gas bubbles with associated fat stranding in the anterior mediastinum. No discrete retrosternal or mediastinal fluid collections. Lungs/Pleura: No pneumothorax. Small to moderate right and moderate to marked dependent left pleural effusions. Moderate centrilobular emphysema. No lung masses or significant pulmonary nodules. Mild-to-moderate right and moderate-to-marked left dependent lower lobe passive atelectasis. Patchy ground-glass opacities in the dependent lungs bilaterally are decreased in the interval. Upper abdomen: No acute abnormality. Musculoskeletal: No aggressive appearing focal osseous lesions. Intact sternotomy wires. 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 Mild scattered subcutaneous emphysema in the left axillary and ventral lower right chest wall.  IMPRESSION: 1. Mild cardiomegaly, increased from prior chest CT. Small to moderate right and moderate-to-marked left dependent pleural effusions with associated lower lobe atelectasis. 2. Patchy ground-glass opacities in the dependent lungs bilaterally are decreased in the interval, suggesting improving inflammatory process. 3. Scattered small gas bubbles with associated fat stranding in the anterior mediastinum compatible with recent surgery. No discrete retrosternal or mediastinal fluid collections. 4. Mild subcutaneous emphysema in the left axillary and ventral lower right chest wall. 5. Aortic Atherosclerosis (ICD10-I70.0) and Emphysema (ICD10-J43.9). Electronically Signed   By: Ilona Sorrel M.D.   On: 01/14/2020 09:01   DG CHEST PORT 1 VIEW  Result Date: 01/12/2020 CLINICAL DATA:  Leukocytosis EXAM: PORTABLE CHEST - 1 VIEW COMPARISON:  01/07/2020 FINDINGS: Pleural effusions and worsening consolidation/atelectasis in the lung bases, left greater than right, increased since previous exam. Some increase in central pulmonary vascular congestion and probable mild interstitial edema. Heart size upper limits normal for technique. Previous CABG and AVR. No pneumothorax. Sternotomy wires. IMPRESSION: Worsening bibasilar consolidation/atelectasis and effusions, left greater than right. Electronically Signed   By: Lucrezia Europe M.D.   On: 01/12/2020 11:13   IR THORACENTESIS ASP PLEURAL SPACE W/IMG GUIDE  Result Date: 01/14/2020 INDICATION: Shortness of breath. Left-sided pleural effusion. Status post open heart surgery. Request diagnostic and therapeutic thoracentesis. EXAM: ULTRASOUND GUIDED LEFT THORACENTESIS MEDICATIONS: None. COMPLICATIONS: None immediate. PROCEDURE: An ultrasound guided thoracentesis was thoroughly discussed with the patient and questions answered. The benefits, risks, alternatives and complications were also discussed. The patient understands and wishes to proceed with the procedure. Written  consent was obtained. Ultrasound was performed to localize and mark an adequate pocket of fluid in the left chest. The area was then prepped and draped in the normal sterile fashion. 1% Lidocaine was used for local anesthesia. Under ultrasound guidance a 6 Fr Safe-T-Centesis catheter was introduced. Thoracentesis was performed. The catheter was removed and a dressing applied. FINDINGS: A total of approximately 1.4 L of dark bloody fluid was removed. Samples were sent to the laboratory as requested by the clinical team. IMPRESSION: Successful ultrasound guided left thoracentesis yielding 1.4 L of pleural fluid. Read by: Ascencion Dike PA-C Electronically  Signed   By: Corrie Mckusick D.O.   On: 01/14/2020 14:20    Labs:  Basic Metabolic Panel: Recent Labs  Lab 01/25/20 1649 01/26/20 0532 01/27/20 0700 01/28/20 0614 01/29/20 0530  NA 138 141 141 141 139  K 4.3 4.2 4.2 4.9 4.6  CL 109 111 107 110 107  CO2 20* 22 25 22 25   GLUCOSE 124* 98 101* 91 107*  BUN 34* 29* 26* 29* 28*  CREATININE 1.44* 1.42* 1.39* 1.30* 1.55*  CALCIUM 8.6* 8.8* 9.1 8.7* 8.6*    CBC: CBC Latest Ref Rng & Units 01/25/2020 01/21/2020 01/20/2020  WBC 4.0 - 10.5 K/uL 9.1 13.0(H) 14.6(H)  Hemoglobin 13.0 - 17.0 g/dL 10.5(L) 10.3(L) 10.6(L)  Hematocrit 39.0 - 52.0 % 33.0(L) 31.5(L) 33.2(L)  Platelets 150 - 400 K/uL 314 281 280   Filed Weights   01/26/20 0453 01/27/20 0452 01/29/20 0500  Weight: 48.8 kg 48.7 kg 45.5 kg    CBG: No results for input(s): GLUCAP in the last 168 hours.  Brief HPI:   Xyion Virden is a 79 y.o. male in relatively good health who was admitted via [facility] on 12/31/2019 with acute onset of chest pain due to STEMI.  Cardiac cath revealed aortic stenosis with ostial LAD lesion.  He was transferred to [facility], evaluated by Dr. Orvan Seen  and taken to the OR emergently for CABG x3 with AVR and clipping of atrial appendage the same day.  Postop course significant for issues with hypotension, A. 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 Colchicine added and heart rate controlled on digoxin and amiodarone. Milrinone was tapered off by 2/3 and medications optimized for management of CHF.  Patient was noted to be debilitated and CIR was recommended due to functional decline.    Hospital Course: Al Stapp was admitted to rehab 01/08/2020 for inpatient therapies to consist of PT and OT at least three hours five days a week. Past admission physiatrist, therapy team and rehab RN have worked together to provide customized collaborative inpatient rehab. Eliquis was added at admission and serial H/H is stable. He continued to have progressive leucocytosis and was found to have worsening of bibasilar consolidation with left greater than right effusions. He was started on IV vancomycin and cefepime for presumed PNA. Dr. Julien Girt was consulted for input and recommended CT chest for evaluation due to rise in WBC to 20,000. CT chest showed right pleural effusion and he underwent thoracocentesis of 1.4 L of darklbloody fluid on 2/11 by IVR. Fluid culture was negative for infection therefore Vancomycin was discontinued and he was treated with 7 day course of Cefepime.   He completed 2 week course of colchicine and diarrhea has been controlled with addition of fiber.  He developed SOB due to acute on chronic renal failure on 2/15 and CXR revealed worsening of pulmonary edema with moderate left pleural effusion. Dr. Aundra Dubin recommended diuresis with IV lasix bid 24 hours then decreased to 20 mg daily.  Flow up CXR showed improvement in left pleural effusion. Digoxin level was 0.7 and WNL.   Amiodarone was decreased to once a day then discontinued on 2/22 but patient developed accelerated junctional rhythm with short runs of SVT therefore amiodarone was resumed and heart rate back in NSR.   He has had decline in activity tolerance and developed issues with  orthostatic symptoms. Losartan and spironolactone was changed to bedtime schedule and he has had improvement in symptoms. He  has been educated on energy conservation measures as well as importance of pulmonary hygiene. His sternal incision is intact and is healing well without s/s of infection. Hypernatremia has been resolved and SCr back up to 1.5. No signs of fluid overload noted. His activity tolerance continues to fluctuate and supervision is recommended at discharge. He will continue to receive follow up HHPT, [place] and [place] by [place] after discharge.    Rehab course: During patient's stay in rehab weekly team conferences were held to monitor patient's progress, set goals and discuss barriers to discharge. At admission, patient required min to mod assist with ADL tasks and min assist with mobility. He has had improvement in activity tolerance, balance, postural control as well as ability to compensate for deficits.  He is able to complete ADL tasks with supervision. He requires supervision for transfers and is able to ambulate 51' with RW.  Family education was completed with daughter regarding all aspects of safety and care  Disposition: Home.  Diet: Heart Healthy.   Special Instructions: 1. Continue Sternal precautions.  2. No driving or strenuous activity till cleared by MD.  Allergies as of 01/29/2020   No Known Allergies     Medication List    STOP taking these medications   colchicine 0.6 MG tablet   feeding supplement (ENSURE ENLIVE) Liqd   levalbuterol 0.63 MG/3ML nebulizer solution Commonly known as: XOPENEX   oxyCODONE 5 MG immediate release tablet Commonly known as: Oxy IR/ROXICODONE     TAKE these medications   acetaminophen 325 MG tablet Commonly known as: TYLENOL Take 1-2 tablets (325-650 mg total) by mouth every 4 (four) hours as needed for mild pain.   amiodarone 200 MG tablet Commonly known as: PACERONE Take 1 tablet (200 mg total) by mouth  daily. What changed: when to take this   apixaban 2.5 MG Tabs tablet Commonly known as: ELIQUIS Take 1 tablet (2.5 mg total) by mouth every 12 (twelve) hours.   aspirin 81 MG chewable tablet Chew 1 tablet (81 mg total) by mouth daily.   atorvastatin 80 MG tablet Commonly known as: LIPITOR Take 1 tablet (80 mg total) by mouth daily at 6 PM.   carvedilol 3.125 MG tablet Commonly known as: COREG Take 1 tablet (3.125 mg total) by mouth 2 (two) times daily with a meal.   Digoxin 62.5 MCG Tabs Take 0.0625 mg by mouth daily.   furosemide 20 MG tablet Commonly known as: Lasix Take 1 tablet (20 mg total) by mouth daily.   losartan 25 MG tablet Commonly known as: COZAAR Take 0.5 tablets (12.5 mg total) by mouth at bedtime. What changed: when to take this   Melatonin 3 MG Tabs Take 2 tablets (6 mg total) by mouth at bedtime. What changed: how much to take   polycarbophil 625 MG tablet Commonly known as: FIBERCON Take 2 tablets (1,250 mg total) by mouth daily. For diarrhea   potassium chloride 10 MEQ tablet Commonly known as: KLOR-CON Take 1 tablet (10 mEq total) by mouth daily.   spironolactone 25 MG tablet Commonly known as: ALDACTONE Take 0.5 tablets (12.5 mg total) by mouth at bedtime. What changed:   how much to take  how to take this  when to take this   traZODone 50 MG tablet Commonly known as: DESYREL Take 0.5-1 tablets (25-50 mg total) by mouth at bedtime as needed for sleep.      Follow-up Information    Lovorn, Jinny Blossom, MD Follow up.  Specialty: Physical Medicine and Rehabilitation Contact information: [id number] [place] [address] [phone]        Wonda Olds, MD. Call on 02/01/2020.   Specialty: Cardiothoracic Surgery Why: for post op appointment Contact information: [address] [place] [address] [place] [address] [phone]        [facility] Follow up on 02/02/2020.    Specialty: Cardiology Why: at 0900. Code 6008 Contact information: [id number] [place]       Marval Regal, NP Follow up on 02/08/2020.   Specialty: Nurse Practitioner Why: Be there at 10 am to set up for primary care/post hospital check.  Contact information: [id number] [place] [address] [phone]           Signed: Bary Leriche 01/30/2020, 9:05 PM

## 2020-01-29 NOTE — Progress Notes (Signed)
Social Work Patient ID: David Perez, male   DOB: 1941-04-20, 79 y.o.   MRN: [id number]   SW met with pt and pt dtr [name] in the room to discuss pt d/c today. Pt dtr confirms there will be 24/7 care between her and her sister to arrange his care. Reports she needs to leave by 1pm today. SW updated medical team.   SW spoke with [facility] [phone]) who confirms pt will be accepted for services HHPT/OT/SN. SW informed pt d/c date today.   Pt already has DME.  Loralee Pacas, MSW, [place] Office: [phone] Cell: [phone] Fax: [phone]

## 2020-01-29 NOTE — Plan of Care (Signed)
, °

## 2020-01-29 NOTE — Progress Notes (Signed)
Patient ID: David Perez, male   DOB: 03-Nov-1941, 79 y.o.   MRN: [id number]     Advanced Heart Failure Rounding Note  PCP-Cardiologist: No primary care provider on file.   Subjective:    1/28: CABG (LIMA-LAD, SVG-LPDA, SVG-OM1), Maze + LA appendage clip, bioprosthetic AVR.   Echo: EF 30%, small posterior pericardial effusion, decreased RV systolic function.   S/P Left Thoracentesis 01/14/20 with 1.4 liters removed.    Anxious about discharge home. Denies SOB.    Objective:   Weight Range: 45.5 kg Body mass index is 15.25 kg/m.   Vital Signs:   Temp:  [97.5 F (36.4 C)-98.3 F (36.8 C)] 98.3 F (36.8 C) (02/25 1936) Pulse Rate:  [50-62] 60 (02/26 0841) Resp:  [16-19] 18 (02/26 0521) BP: (91-113)/(49-62) 107/62 (02/26 0841) SpO2:  [96 %-99 %] 99 % (02/26 0521) FiO2 (%):  [21 %] 21 % (02/25 2043) Weight:  [45.5 kg] 45.5 kg (02/26 0500) Last BM Date: 01/29/20  Weight change: Filed Weights   01/26/20 0453 01/27/20 0452 01/29/20 0500  Weight: 48.8 kg 48.7 kg 45.5 kg    Intake/Output:   Intake/Output Summary (Last 24 hours) at 01/29/2020 1216 Last data filed at 01/29/2020 0751 Gross per 24 hour  Intake 840 ml  Output 500 ml  Net 340 ml      Physical Exam  General:  Thin. Sitting in the chair.  No resp difficulty HEENT: normal Neck: supple. no JVD. Carotids 2+ bilat; no bruits. No lymphadenopathy or thryomegaly appreciated. Cor: PMI nondisplaced. Regular rate & rhythm. No rubs, gallops or murmurs. Lungs: clear Abdomen: soft, nontender, nondistended. No hepatosplenomegaly. No bruits or masses. Good bowel sounds. Extremities: no cyanosis, clubbing, rash, edema Neuro: alert & orientedx3, cranial nerves grossly intact. moves all 4 extremities w/o difficulty. Affect pleasant]  Labs    CBC No results for input(s): WBC, NEUTROABS, HGB, HCT, MCV, PLT in the last 72 hours. Basic Metabolic Panel Recent Labs    01/28/20 0614 01/29/20 0530  NA 141 139  K 4.9 4.6  CL  110 107  CO2 22 25  GLUCOSE 91 107*  BUN 29* 28*  CREATININE 1.30* 1.55*  CALCIUM 8.7* 8.6*   Liver Function Tests No results for input(s): AST, ALT, ALKPHOS, BILITOT, PROT, ALBUMIN in the last 72 hours. No results for input(s): LIPASE, AMYLASE in the last 72 hours. Cardiac Enzymes No results for input(s): CKTOTAL, CKMB, CKMBINDEX, TROPONINI in the last 72 hours.  BNP: BNP (last 3 results) Recent Labs    01/25/20 1649  BNP 428.1*    ProBNP (last 3 results) No results for input(s): PROBNP in the last 8760 hours.   D-Dimer No results for input(s): DDIMER in the last 72 hours. Hemoglobin A1C No results for input(s): HGBA1C in the last 72 hours. Fasting Lipid Panel No results for input(s): CHOL, HDL, LDLCALC, TRIG, CHOLHDL, LDLDIRECT in the last 72 hours. Thyroid Function Tests No results for input(s): TSH, T4TOTAL, T3FREE, THYROIDAB in the last 72 hours.  Invalid input(s): FREET3  Other results:   Imaging    No results found.   Medications:     Scheduled Medications: . amiodarone  200 mg Oral Daily  . apixaban  2.5 mg Oral Q12H  . aspirin  81 mg Oral Daily  . atorvastatin  80 mg Oral q1800  . carvedilol  3.125 mg Oral BID WC  . chlorhexidine  15 mL Mouth/Throat BID  . digoxin  0.0625 mg Oral Daily  . feeding supplement (ENSURE ENLIVE)  237 mL Oral TID BM  . furosemide  20 mg Oral Daily  . losartan  12.5 mg Oral QHS  . polycarbophil  1,250 mg Oral Daily  . potassium chloride  10 mEq Oral Daily  . spironolactone  12.5 mg Oral QHS  . traZODone  50 mg Oral QHS    Infusions:   PRN Medications: acetaminophen, alum & mag hydroxide-simeth, bisacodyl, diphenhydrAMINE, guaiFENesin-dextromethorphan, levalbuterol, Melatonin, oxyCODONE, polyethylene glycol, prochlorperazine **OR** prochlorperazine **OR** prochlorperazine, sodium phosphate   Assessment/Plan   1. CAD: STEMI with very short LM and 99% ostial LAD. PCI thought to risk compromise of ostial large  LCx, so decision made to defer PCI and transfer to [facility] for CABG as patient was CP-free at that point. Now s/p CABG with LIMA-LAD, SVG-OM2, SVG-LPDA.  No chest pain.  - Continue ASA.  - Continue atorvastatin 80 mg daily 2. Chronic systolic CHF: Ischemic cardiomyopathy.  Pre-op echo with EF 35%.  Now s/p CABG-AVR.  Echo post-op with EF 30%, RV dysfunction. Milrinone discontinued.  He continues to report dyspnea, but he does not appear volume overloaded on exam.  CXR was not impressive.  - With orthostasis, would keep Lasix at 20 mg daily.    - Continue spironolactone 12.5 daily continue at bed time.  - Continue Coreg 3.125 mg bid.  - Continue digoxin 0.0625 daily, dig level ok 0.7 (2/23) - Continue losartan 12.5 mg daily, change dosing time to qhs.   3. Atrial fibrillation: S/p Maze, LA appendage clip.  Amiodarone was stopped 2/22, but now with SVT runs (?atrial fibrillation) and accelerated junctional rhythm on ECG 2/24   - continue amiodarone at 200 mg daily - Continue eliquis 2.5 mg twice a day. Lower dose with age and weight.  4. Hyperlipidemia: LDL 187. Goal <70.  - Continue atorvastatin 80 qhs.  - Repeat FLP + HFTs in 6 weeks. If not at goal will need addition of Zetia +/- PCSK9i  5. Thrombocytopenia: Post-op, trending up. Platelets stable.  6. Left pleural effusion: 01/14/20 S/P Left thoracentesis 1.4 liters.  Repeat CXR 2/22 showed improving left pleural effusion and stable small rt pleural effusion.  7. AKI: Creatinine stable 1.55 today . 8. Delirium: Resolved.  9. ID: He completed course of cefepime in CIR.  10. Elevated LFTs: ?Shock liver from hypotensive episode immediately post-op. LFTs trended down.  RUQ US unremarkable.  11. Deconditioning.  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  CRP up but not ESR.   - Completed 2 wk course of colchicine.   [place] for d/c home. HF follow up set up.    Length of Stay: [place], NP  01/29/2020, 12:16 PM  Advanced Heart Failure Team Pager [phone] (M-F; [place])  Please contact [facility] for night-coverage after hours (4p -7a ) and weekends on [email or web address]

## 2020-01-29 NOTE — Progress Notes (Signed)
Physical Therapy Session Note  Patient Details  Name: David Perez MRN: [id number] Date of Birth: 1940-12-24  Today's Date: 01/29/2020 PT Individual Time:  - 0     Short Term Goals: Week 3:  PT Short Term Goal 1 (Week 3): STG=LTG due to ELOS.  Skilled Therapeutic Interventions/Progress Updates: Pt being D/C'd as PT enters room.  No treatment given.     Therapy Documentation Precautions:  Precautions Precautions: Sternal, Fall Precaution Comments: Reviewed sternal precautions Restrictions Weight Bearing Restrictions: No Other Position/Activity Restrictions: sternal precautions General: PT Amount of Missed Time (min): 45 Minutes PT Missed Treatment Reason: Other (Comment)(Pt being D/cid w/ family as PT enters room.  No treatment given.)    Therapy/Group: Individual Therapy  David Perez 01/29/2020, 2:29 PM

## 2020-01-29 NOTE — Progress Notes (Signed)
Occupational Therapy Session Note  Patient Details  Name: David Perez MRN: [id number] Date of Birth: Jan 24, 1941  Today's Date: 01/29/2020 OT Individual Time: [id number] OT Individual Time Calculation (min): 45 min    Short Term Goals: Week 2:  OT Short Term Goal 1 (Week 2): STG = LTG  Skilled Therapeutic Interventions/Progress Updates:    Treatment session with focus on activity tolerance, endurance, and strengthening while preparing for d/c home vs SNF.  Pt received seated EOB with MD leaving room.  Pt stating "this is all redundant" and declined bathing/dressing as he feels he can do that on his own but expressed desire to focus on BLE strengthening/therex.  Pt completed sit > stand and stand pivot transfer to w/c with RW with supervision.  Focus on independent carryover of Home HEP with pt able to recall setup and technique without cues.  Engaged in discussion regarding quality of movement over quantity as pt reporting need for frequent rest breaks.  Discussed vitals as pt with inconsistent HR, ?Afib, during activity when pt feeling symptomatically "off".  Discussed pt desire to d/c home with recommendation for 24/7 supervision as well as places to sit throughout house if pt were to have these episodes where he feels "off" of lightheaded.  Educated on energy conservation strategies and delegating tasks to allow him to focus on what is important to him.  Pt pleased with education and time with therapist, agreeable to remain sitting up.  Left with all needs in reach and seat belt alarm on.  Therapy Documentation Precautions:  Precautions Precautions: Sternal, Fall Precaution Comments: Reviewed sternal precautions Restrictions Weight Bearing Restrictions: No Other Position/Activity Restrictions: sternal precautions Pain:  Pt with no c/o pain   Therapy/Group: Individual Therapy  Simonne Come 01/29/2020, 12:44 PM

## 2020-01-29 NOTE — Progress Notes (Signed)
Pt discharged to home with family, d/c instructions provided to pt and family by P.Love, sutures removed.

## 2020-01-30 DIAGNOSIS — J90 Pleural effusion, not elsewhere classified: Secondary | ICD-10-CM

## 2020-02-02 ENCOUNTER — Encounter (HOSPITAL_COMMUNITY): Payer: Self-pay

## 2020-02-02 ENCOUNTER — Other Ambulatory Visit: Payer: Self-pay

## 2020-02-02 ENCOUNTER — Ambulatory Visit (HOSPITAL_COMMUNITY)
Admit: 2020-02-02 | Discharge: 2020-02-02 | Disposition: A | Discharge status: Home / Self Care | Payer: Medicare Other | Source: Ambulatory Visit | Attending: Cardiology | Admitting: Cardiology

## 2020-02-02 VITALS — BP 104/52 | HR 47 | Wt 107.5 lb

## 2020-02-02 DIAGNOSIS — I480 Paroxysmal atrial fibrillation: Secondary | ICD-10-CM

## 2020-02-02 DIAGNOSIS — N289 Disorder of kidney and ureter, unspecified: Secondary | ICD-10-CM | POA: Diagnosis not present

## 2020-02-02 DIAGNOSIS — R0609 Other forms of dyspnea: Secondary | ICD-10-CM | POA: Insufficient documentation

## 2020-02-02 DIAGNOSIS — Z953 Presence of xenogenic heart valve: Secondary | ICD-10-CM | POA: Insufficient documentation

## 2020-02-02 DIAGNOSIS — Z7901 Long term (current) use of anticoagulants: Secondary | ICD-10-CM | POA: Insufficient documentation

## 2020-02-02 DIAGNOSIS — I2510 Atherosclerotic heart disease of native coronary artery without angina pectoris: Secondary | ICD-10-CM | POA: Insufficient documentation

## 2020-02-02 DIAGNOSIS — I252 Old myocardial infarction: Secondary | ICD-10-CM | POA: Diagnosis not present

## 2020-02-02 DIAGNOSIS — Z951 Presence of aortocoronary bypass graft: Secondary | ICD-10-CM | POA: Diagnosis not present

## 2020-02-02 DIAGNOSIS — Z7982 Long term (current) use of aspirin: Secondary | ICD-10-CM | POA: Diagnosis not present

## 2020-02-02 DIAGNOSIS — Z79899 Other long term (current) drug therapy: Secondary | ICD-10-CM | POA: Insufficient documentation

## 2020-02-02 DIAGNOSIS — Z952 Presence of prosthetic heart valve: Secondary | ICD-10-CM

## 2020-02-02 DIAGNOSIS — I5022 Chronic systolic (congestive) heart failure: Secondary | ICD-10-CM | POA: Insufficient documentation

## 2020-02-02 DIAGNOSIS — I4891 Unspecified atrial fibrillation: Secondary | ICD-10-CM | POA: Diagnosis not present

## 2020-02-02 DIAGNOSIS — R001 Bradycardia, unspecified: Secondary | ICD-10-CM | POA: Insufficient documentation

## 2020-02-02 DIAGNOSIS — Z8619 Personal history of other infectious and parasitic diseases: Secondary | ICD-10-CM | POA: Insufficient documentation

## 2020-02-02 DIAGNOSIS — J90 Pleural effusion, not elsewhere classified: Secondary | ICD-10-CM | POA: Insufficient documentation

## 2020-02-02 DIAGNOSIS — I255 Ischemic cardiomyopathy: Secondary | ICD-10-CM | POA: Insufficient documentation

## 2020-02-02 DIAGNOSIS — E785 Hyperlipidemia, unspecified: Secondary | ICD-10-CM | POA: Insufficient documentation

## 2020-02-02 DIAGNOSIS — Z87891 Personal history of nicotine dependence: Secondary | ICD-10-CM | POA: Diagnosis not present

## 2020-02-02 LAB — BASIC METABOLIC PANEL
Anion gap: 7 (ref 5–15)
BUN: 26 mg/dL — ABNORMAL HIGH (ref 8–23)
CO2: 24 mmol/L (ref 22–32)
Calcium: 8.6 mg/dL — ABNORMAL LOW (ref 8.9–10.3)
Chloride: 106 mmol/L (ref 98–111)
Creatinine, Ser: 1.48 mg/dL — ABNORMAL HIGH (ref 0.61–1.24)
GFR calc Af Amer: 51 mL/min — ABNORMAL LOW (ref >60)
GFR calc non Af Amer: 44 mL/min — ABNORMAL LOW (ref >60)
Glucose, Bld: 100 mg/dL — ABNORMAL HIGH (ref 70–99)
Potassium: 4 mmol/L (ref 3.5–5.1)
Sodium: 137 mmol/L (ref 135–145)

## 2020-02-02 LAB — DIGOXIN LEVEL: Digoxin Level: 0.8 ng/mL (ref 0.8–2.0)

## 2020-02-02 LAB — MAGNESIUM: Magnesium: 2.2 mg/dL (ref 1.7–2.4)

## 2020-02-02 NOTE — Patient Instructions (Signed)
Lab work done today. We will notify you of any abnormal lab work. No news is good news!  STOP Carvedilol  Please follow up with the [facility] in 3-4 weeks.  At the [facility], you and your health needs are our priority. As part of our continuing mission to provide you with exceptional heart care, we have created designated Provider Care Teams. These Care Teams include your primary Cardiologist (physician) and Advanced Practice Providers (APPs- Physician Assistants and Nurse Practitioners) who all work together to provide you with the care you need, when you need it.   You may see any of the following providers on your designated Care Team at your next follow up: Marland Kitchen Dr Glori Bickers . Dr Loralie Champagne . Darrick Grinder, NP . Lyda Jester, PA . Audry Riles, PharmD   Please be sure to bring in all your medications bottles to every appointment.

## 2020-02-02 NOTE — Progress Notes (Signed)
Advanced Heart Failure Clinic Note   Referring Physician: PCP: Patient, No Pcp Per PCP-Cardiologist: Dr. Aundra Dubin CT Surgeon: Dr. Orvan Seen  Reason for Visit: [facility] F/u for Systolic Heart Failure/Ischemic Cardiomyopathy   HPI:  79 y/o male recently admitted Jan 2021 for acute STEMI c/b cardiogenic shock, requiring IABP, and found to have multivessel disease w/ LM involvement, ischemic CM w/ EF 30%, severe aortic stenosis and atrial fibrillatoin. He underwent CABG w/ LIMA-LAD, SVG-OM2, SVG-LPDA + bioprosthetic AVR and Maze procedure + LAA clip. Post-op required milrinone for RV support. Post-op course, developed left pleural effusion requiring thoracentesis. Once medically stable, he was transferred to [facility] for therapy and ultimately discharged home on 2/26.   He now presents to clinic for post hospital f/u. Here w/ his daughter.    Getting home health PT 2-3 days a week. To continue for 6-8 weeks. Feels that he is getting better/ stronger. Exercise tolerance improving and less SOB. Denies ischemic CP. No palpitations. His BP has been running "low" w/ PT. Yesterday, SBP was in the 80s briefly but no syncope/ near syncope. BP today in the low 270J systolic after AM meds. EKG shows sinus bradycardia 46 bpm, but currently asymptomatic. He has recently had some diarrhea but denies melena/hematochzia. No nausea/vomitting. Fully compliant w/ meds. No LEE, wt gain, orthopnea or PND.    Review of Systems: [y] = yes, '[ ]'  = no   General: Weight gain '[ ]' ; Weight loss '[ ]' ; Anorexia '[ ]' ; Fatigue '[ ]' ; Fever '[ ]' ; Chills '[ ]' ; Weakness '[ ]'   Cardiac: Chest pain/pressure '[ ]' ; Resting SOB '[ ]' ; Exertional SOB '[ ]' ; Orthopnea '[ ]' ; Pedal Edema '[ ]' ; Palpitations '[ ]' ; Syncope '[ ]' ; Presyncope '[ ]' ; Paroxysmal nocturnal dyspnea'[ ]'   Pulmonary: Cough '[ ]' ; Wheezing'[ ]' ; Hemoptysis'[ ]' ; Sputum '[ ]' ; Snoring '[ ]'   GI: Vomiting'[ ]' ; Dysphagia'[ ]' ; Melena'[ ]' ; Hematochezia '[ ]' ; Heartburn'[ ]' ; Abdominal pain '[ ]' ; Constipation '[ ]' ;  Diarrhea '[ ]' ; BRBPR '[ ]'   GU: Hematuria'[ ]' ; Dysuria '[ ]' ; Nocturia'[ ]'   Vascular: Pain in legs with walking '[ ]' ; Pain in feet with lying flat '[ ]' ; Non-healing sores '[ ]' ; Stroke '[ ]' ; TIA '[ ]' ; Slurred speech '[ ]' ;  Neuro: Headaches'[ ]' ; Vertigo'[ ]' ; Seizures'[ ]' ; Paresthesias'[ ]' ;Blurred vision '[ ]' ; Diplopia '[ ]' ; Vision changes '[ ]'   Ortho/Skin: Arthritis '[ ]' ; Joint pain '[ ]' ; Muscle pain '[ ]' ; Joint swelling '[ ]' ; Back Pain '[ ]' ; Rash '[ ]'   Psych: Depression'[ ]' ; Anxiety'[ ]'   Heme: Bleeding problems '[ ]' ; Clotting disorders '[ ]' ; Anemia '[ ]'   Endocrine: Diabetes '[ ]' ; Thyroid dysfunction'[ ]'    Past Medical History:  Diagnosis Date  . 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 Hepatitis B     Current Outpatient Medications  Medication Sig Dispense Refill  . acetaminophen (TYLENOL) 325 MG tablet Take 1-2 tablets (325-650 mg total) by mouth every 4 (four) hours as needed for mild pain.    Marland Kitchen amiodarone (PACERONE) 200 MG tablet Take 1 tablet (200 mg total) by mouth daily. 30 tablet 1  . apixaban (ELIQUIS) 2.5 MG TABS tablet Take 1 tablet (2.5 mg total) by mouth every 12 (twelve) hours. 60 tablet 1  . aspirin 81 MG chewable tablet Chew 1 tablet (81 mg total) by mouth daily.    Marland Kitchen atorvastatin (LIPITOR) 80 MG tablet Take 1 tablet (80 mg total) by mouth daily at 6 PM. 30 tablet 1  . carvedilol (  COREG) 3.125 MG tablet Take 1 tablet (3.125 mg total) by mouth 2 (two) times daily with a meal. 60 tablet 1  . digoxin (LANOXIN) 0.125 MG tablet Take 0.0625 mg by mouth daily.    . furosemide (LASIX) 20 MG tablet Take 1 tablet (20 mg total) by mouth daily. 30 tablet 1  . losartan (COZAAR) 25 MG tablet Take 0.5 tablets (12.5 mg total) by mouth at bedtime. 30 tablet 0  . Melatonin 3 MG TABS Take 2 tablets (6 mg total) by mouth at bedtime.  0  . potassium chloride (KLOR-CON) 10 MEQ tablet Take 1 tablet (10 mEq total) by mouth daily. 30 tablet 1  . spironolactone (ALDACTONE) 25 MG tablet Take 0.5 tablets (12.5 mg  total) by mouth at bedtime. 30 tablet 0  . traZODone (DESYREL) 50 MG tablet Take 0.5-1 tablets (25-50 mg total) by mouth at bedtime as needed for sleep. 15 tablet 0  . polycarbophil (FIBERCON) 625 MG tablet Take 2 tablets (1,250 mg total) by mouth daily. For diarrhea (Patient not taking: Reported on 02/02/2020) 60 tablet 0   No current facility-administered medications for this encounter.    No Known Allergies    Social History   Socioeconomic History  . Marital status: Widowed    Spouse name: Not on file  . Number of children: Not on file  . Years of education: Not on file  . Highest education level: Not on file  Occupational History  . Not on file  Tobacco Use  . Smoking status: Former Smoker    Packs/day: 0.50    Years: 40.00    Pack years: 20.00    Quit date: 1996    Years since quitting: 25.1  . Smokeless tobacco: Never Used  Substance and Sexual Activity  . Alcohol use: Not Currently  . Drug use: Not Currently  . Sexual activity: Not Currently  Other Topics Concern  . Not on file  Social History Narrative  . Not on file   Social Determinants of Health   Financial Resource Strain:   . Difficulty of Paying Living Expenses: Not on file  Food Insecurity:   . Worried About Charity fundraiser in the Last Year: Not on file  . Ran Out of Food in the Last Year: Not on file  Transportation Needs:   . Lack of Transportation (Medical): Not on file  . Lack of Transportation (Non-Medical): Not on file  Physical Activity:   . Days of Exercise per Week: Not on file  . Minutes of Exercise per Session: Not on file  Stress:   . Feeling of Stress : Not on file  Social Connections:   . Frequency of Communication with Friends and Family: Not on file  . Frequency of Social Gatherings with Friends and Family: Not on file  . Attends Religious Services: Not on file  . Active Member of Clubs or Organizations: Not on file  . Attends Archivist Meetings: Not on file  .  Marital Status: Not on file  Intimate Partner Violence:   . Fear of Current or Ex-Partner: Not on file  . Emotionally Abused: Not on file  . Physically Abused: Not on file  . Sexually Abused: Not on file     No family history on file.  Vitals:   02/02/20 0811  BP: (!) 104/52  Pulse: (!) 47  SpO2: 99%  Weight: 48.8 kg (107 lb 8 oz)     PHYSICAL EXAM: General:  Well appearing, thin elderly  WM. No respiratory difficulty HEENT: normal Neck: supple. no JVD. Carotids 2+ bilat; no bruits. No lymphadenopathy or thyromegaly appreciated.  Cor: PMI nondisplaced. Regular rhythm, slow rate. 2/6 murmur at LLSB Lungs: clear Abdomen: soft, nontender, nondistended. No hepatosplenomegaly. No bruits or masses. Good bowel sounds. Extremities: no cyanosis, clubbing, rash, edema, kyphosis of thoracic spine Neuro: alert & oriented x 3, cranial nerves grossly intact. moves all 4 extremities w/o difficulty. Affect pleasant.  ECG: sinus bradycardia 46 bpm, LBBB   ASSESSMENT & PLAN:  1. CAD: S/p STEMI 12/2019 c/b cardiogenic shock requiring IABP. LHC with very short LM and 99% ostial LAD. PCI thought to risk compromise of ostial large LCx, so decision made to defer PCI and treat surgically. Now s/p CABG with LIMA-LAD, SVG-OM2, SVG-LPDA.  - stable. No s/s of ischemia    - Continue ASA.  -Continue atorvastatin 65m daily 2. Chronic systolic CHF: Ischemic cardiomyopathy.  Pre-op echo with EF 35%.  Now s/p CABG-AVR.  Echo post-op with EF 30%, RV dysfunction. - Evolemic on exam. He continues w/ mild DOE but improving, suspect primarily 2/2 deconditioning and not CHF  - Continue spironolactone 12.5 daily   - Continue digoxin 0.0625 daily, dig level ok 0.7 (2/23). Will repeat dig level today  - Continue losartan 12.5 mg daily - Stop Coreg given bradycardia w/ HR in the 40s and soft BP   3. Atrial fibrillation: S/p Maze, LA appendage clip.  Amiodarone was stopped 2/22 but restarted given SVT runs  (?atrial fibrillation) and accelerated junctional rhythm on ECG 2/24   - continue amiodarone at 200 mg daily. Will need to monitor HR. May need reduction to 100 mg daily if bradycardia persist (will d/c Coreg) - Continue eliquis 2.5 mg twice a day. Lower dose with age and weight. He denies abnormal bleeding  4. Hyperlipidemia: LDL 187. Goal <70.  - Continue atorvastatin 80 qhs.  - Repeat FLP + HFTs in 6 weeks. If not at goal will need addition of Zetia +/- [id number]  5. Left pleural effusion: S/P Left thoracentesis 2/11 w/ 1.4 liters removed.  Repeat CXR 2/22 showed improving left pleural effusion and stable small rt pleural effusion. Breathing improved. Lung exam unremarkable  7. Renal Insuffiencey: developed AKI during recent hospitalization.  - Check BMP today  12. 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  CRP up but not ESR.   - He was treated w/ 2 wk course of colchicine. He denies any pleuritic CP.  13.  Bradycardia:  - sinus brady on EKG. HR 46 bpm. Asymptomatic. BP soft. Will stop coreg.  - Check BMP, Mg and digoxin level  - PT can monitor HR at home. Will call if he becomes symptomatic   F/u in 3-4 weeks    BLyda Jester PA-C 02/02/20

## 2020-02-04 ENCOUNTER — Other Ambulatory Visit: Payer: Self-pay | Admitting: Cardiothoracic Surgery

## 2020-02-04 DIAGNOSIS — Z951 Presence of aortocoronary bypass graft: Secondary | ICD-10-CM

## 2020-02-08 ENCOUNTER — Ambulatory Visit: Payer: Medicare Other | Admitting: Nurse Practitioner

## 2020-02-08 ENCOUNTER — Other Ambulatory Visit: Payer: Self-pay

## 2020-02-08 ENCOUNTER — Ambulatory Visit (INDEPENDENT_AMBULATORY_CARE_PROVIDER_SITE_OTHER): Payer: Self-pay | Admitting: Cardiothoracic Surgery

## 2020-02-08 ENCOUNTER — Encounter: Payer: Self-pay | Admitting: Nurse Practitioner

## 2020-02-08 ENCOUNTER — Ambulatory Visit
Admission: RE | Admit: 2020-02-08 | Discharge: 2020-02-08 | Disposition: A | Discharge status: Home / Self Care | Payer: Medicare Other | Source: Ambulatory Visit | Attending: Cardiothoracic Surgery | Admitting: Cardiothoracic Surgery

## 2020-02-08 VITALS — BP 119/72 | HR 63 | Temp 97.7°F | Ht 68.0 in | Wt 108.0 lb

## 2020-02-08 VITALS — BP 128/72 | HR 64 | Temp 98.2°F | Resp 18 | Ht 65.5 in | Wt 109.2 lb

## 2020-02-08 DIAGNOSIS — Z951 Presence of aortocoronary bypass graft: Secondary | ICD-10-CM

## 2020-02-08 DIAGNOSIS — R197 Diarrhea, unspecified: Secondary | ICD-10-CM

## 2020-02-08 DIAGNOSIS — F5102 Adjustment insomnia: Secondary | ICD-10-CM

## 2020-02-08 DIAGNOSIS — Z952 Presence of prosthetic heart valve: Secondary | ICD-10-CM

## 2020-02-08 MED ORDER — MEGESTROL ACETATE 20 MG PO TABS: 20.0000 mg | ORAL_TABLET | Freq: Every day | ORAL | 2 refills | Status: DC

## 2020-02-08 NOTE — Patient Instructions (Addendum)
  It was nice to meet you and David Perez today.  We have ordered a stool study to look for a cause for your diarrhea. Turn that in as soon as you can.  We will call you when the results are in to make further recommendations.   Turn in your stool cups to [name]- where the big AN[facility] STATUE is located. Go into the MEDICAL MALL to the REGISTRATION DESK. Tell them you have a LAB SAMPLE and you will be directed as to what to do.   For insomnia: We discussed the different options to include: good sleep hygiene practices. Keep a  regular bedtime, avoidance of all TV electronics for 90 minutes before bed.  Avoid all caffeine products after noon.Try warm bath before bed and read before bed. Try the melatonin again.   Follow-up with your heart specialists. Get the Covid vaccine as you have already planned.   Follow-up with [name] in 3 months. We can discuss other routine vaccinations in the future.   Call with any questions or concerns time.

## 2020-02-08 NOTE — Progress Notes (Addendum)
Subjective:    Patient ID: David Perez, male    DOB: [birth year], 79 y.o.   MRN: [id number]  HPI  This 79 year old patient comes in with his daughter [name] to establish care with primary care provider.  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 He did have congestive heart failure and arrhythmia and had post -op thoracentesis from each chest. He has had 2 subsequent admissions for treatment. The most recent admission 01/08/2020 with Dc CXR showing improvement in left pleural effusion and stable small right pleural effusion.  He is living at home but his DTR moved in to help him. He is exercising. Trying to gain strength. He is still followed closely by his cardiac surgeon as well as cardiologist.  He presents on new medications and list was reviewed for hospital reconciliation. He has never had a PCP.   His primary concerns today are:  1. Diarrhea and he cannot get his strength back to normal. He blames it on  diarrhea that started in rehab in Feb. He has daytime stools, mushy to liquid up to 3-5 times a day. No nocturnal stool, and no cramps, pain, bloating or any abdominal discomfort. No n/v and good appetite He eats light and healthy. He stopped Fiber Con 02/02/20 thinking that was causing his problem-no improvement. He had antibiotics in the hospital.   2. Insomnia does not like to take trazodone even half dose as it makes him too drowsy during the day. No falls. Melatonin 3 mg worked initially and then it quit.  He watches TV and can't sleep well.  3. Weight loss. Still working with PT gaining strength.  He weighed 140-150 lbs for most of middle age and increased his exercise to get his wt lower into the 120's. Always worked out wts or cardiovascular since age 35.Baseline wt is 122 lbs before surgery and now 107 lb at home. He eats: cereal, egg toast, OJ, water, meat, veggies, bread, rare Boost or Ensure.DTR is very attentive. Wt  Readings from Last 3 Encounters:  02/08/20 108 lb (49 kg)  02/08/20 109 lb 3.2 oz (49.5 kg)  02/02/20 107 lb 8 oz (48.8 kg)   4. Covid vaccine was recommended and he is to talk with  Dr. Orvan Seen today about this. He is due for CXR today. He says he has never had the flu or pneumonia vaccines.   5. Anemia: Post op Lab Results  Component Value Date   WBC 9.1 01/25/2020   HGB 10.5 (L) 01/25/2020   HCT 33.0 (L) 01/25/2020   MCV 104.1 (H) 01/25/2020   PLT 314 01/25/2020   6. AKI: Epic labs and notes reviewed.  Improving slowly from post op.    7. Elevated LFTS- improving from post op shock liver. RUQ abd US showed grossly normal liver, portal vein patent with normal direction of blood flow towards the liver on doppler. Trace ascites and small right pleural effusion.     Chemistry      Component Value Date/Time   NA 137 02/02/2020 0849   K 4.0 02/02/2020 0849   CL 106 02/02/2020 0849   CO2 24 02/02/2020 0849   BUN 26 (H) 02/02/2020 0849   CREATININE 1.48 (H) 02/02/2020 0849      Component Value Date/Time   CALCIUM 8.6 (L) 02/02/2020 0849   ALKPHOS 87 01/14/2020 0550   AST 50 (H) 01/14/2020 0550   ALT 118 (H) 01/14/2020  0550   BILITOT 2.5 (H) 01/14/2020 0550      Past Medical History:  Diagnosis Date  . 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 Hepatitis B      Social History   Socioeconomic History  . Marital status: Widowed    Spouse name: Not on file  . Number of children: Not on file  . Years of education: Not on file  . Highest education level: Not on file  Occupational History  . Not on file  Tobacco Use  . Smoking status: Former Smoker    Packs/day: 0.50    Years: 40.00    Pack years: 20.00    Quit date: 1996    Years since quitting: 25.2  . Smokeless tobacco: Never Used  Substance and Sexual Activity  . Alcohol use: Not Currently  . Drug use: Not Currently  . Sexual activity: Not Currently  Other Topics Concern  . Not on file  Social  History Narrative  . Not on file   Social Determinants of Health   Financial Resource Strain:   . Difficulty of Paying Living Expenses: Not on file  Food Insecurity:   . Worried About Charity fundraiser in the Last Year: Not on file  . Ran Out of Food in the Last Year: Not on file  Transportation Needs:   . Lack of Transportation (Medical): Not on file  . Lack of Transportation (Non-Medical): Not on file  Physical Activity:   . Days of Exercise per Week: Not on file  . Minutes of Exercise per Session: Not on file  Stress:   . Feeling of Stress : Not on file  Social Connections:   . Frequency of Communication with Friends and Family: Not on file  . Frequency of Social Gatherings with Friends and Family: Not on file  . Attends Religious Services: Not on file  . Active Member of Clubs or Organizations: Not on file  . Attends Archivist Meetings: Not on file  . Marital Status: Not on file  Intimate Partner Violence:   . Fear of Current or Ex-Partner: Not on file  . Emotionally Abused: Not on file  . Physically Abused: Not on file  . Sexually Abused: Not on file    Past Surgical History:  Procedure Laterality Date  . AORTIC VALVE REPLACEMENT N/A 12/31/2019   Procedure: AORTIC VALVE REPLACEMENT (AVR);  Surgeon: Wonda Olds, MD;  Location: [facility];  Service: Open Heart Surgery;  Laterality: N/A;  . CLIPPING OF ATRIAL APPENDAGE Left 12/31/2019   Procedure: Clipping Of Atrial Appendage;  Surgeon: Wonda Olds, MD;  Location: [facility];  Service: Open Heart Surgery;  Laterality: Left;  . CORONARY ARTERY BYPASS GRAFT N/A 12/31/2019   Procedure: CORONARY ARTERY BYPASS GRAFTING (CABG) TIMES THREE USING LEFT INTERNAL MAMMARY ARTERY AND LEFT GREATER SAPHENOUS LEG VEIN HARVESTED ENDOSCOPICALLY;  Surgeon: Wonda Olds, MD;  Location: [facility];  Service: Open Heart Surgery;  Laterality: N/A;  POSSIBLE BILATERAL IMA  . CORONARY/GRAFT ACUTE MI REVASCULARIZATION N/A 12/31/2019    Procedure: Coronary/Graft Acute MI Revascularization;  Surgeon: Yolonda Kida, MD;  Location: [facility];  Service: Cardiovascular;  Laterality: N/A;  . IR THORACENTESIS ASP PLEURAL SPACE W/IMG GUIDE  01/14/2020  . LEFT HEART CATH AND CORONARY ANGIOGRAPHY N/A 12/31/2019   Procedure: LEFT HEART CATH AND CORONARY ANGIOGRAPHY;  Surgeon: Yolonda Kida, MD;  Location: [facility];  Service: Cardiovascular;  Laterality: N/A;  .  MAZE N/A 12/31/2019   Procedure: MAZE;  Surgeon: Wonda Olds, MD;  Location: [facility];  Service: Open Heart Surgery;  Laterality: N/A;  . TEE WITHOUT CARDIOVERSION N/A 12/31/2019   Procedure: TRANSESOPHAGEAL ECHOCARDIOGRAM (TEE);  Surgeon: Wonda Olds, MD;  Location: [facility];  Service: Open Heart Surgery;  Laterality: N/A;    Family History  Problem Relation Age of Onset  . Cancer Father     No Known Allergies  Current Outpatient Medications on File Prior to Visit  Medication Sig Dispense Refill  . acetaminophen (TYLENOL) 325 MG tablet Take 1-2 tablets (325-650 mg total) by mouth every 4 (four) hours as needed for mild pain.    Marland Kitchen amiodarone (PACERONE) 200 MG tablet Take 1 tablet (200 mg total) by mouth daily. 30 tablet 1  . apixaban (ELIQUIS) 2.5 MG TABS tablet Take 1 tablet (2.5 mg total) by mouth every 12 (twelve) hours. 60 tablet 1  . aspirin 81 MG chewable tablet Chew 1 tablet (81 mg total) by mouth daily.    Marland Kitchen atorvastatin (LIPITOR) 80 MG tablet Take 1 tablet (80 mg total) by mouth daily at 6 PM. 30 tablet 1  . digoxin (LANOXIN) 0.125 MG tablet Take 0.0625 mg by mouth daily.    . furosemide (LASIX) 20 MG tablet Take 1 tablet (20 mg total) by mouth daily. 30 tablet 1  . losartan (COZAAR) 25 MG tablet Take 0.5 tablets (12.5 mg total) by mouth at bedtime. 30 tablet 0  . Melatonin 3 MG TABS Take 2 tablets (6 mg total) by mouth at bedtime.  0  . potassium chloride (KLOR-CON) 10 MEQ tablet Take 1 tablet (10 mEq total) by mouth daily. 30  tablet 1  . spironolactone (ALDACTONE) 25 MG tablet Take 0.5 tablets (12.5 mg total) by mouth at bedtime. 30 tablet 0  . traZODone (DESYREL) 50 MG tablet Take 0.5-1 tablets (25-50 mg total) by mouth at bedtime as needed for sleep. 15 tablet 0   No current facility-administered medications on file prior to visit.    BP 128/72   Pulse 64   Temp 98.2 F (36.8 C) (Temporal)   Resp 18   Ht 5' 5.5" (1.664 m)   Wt 109 lb 3.2 oz (49.5 kg)   SpO2 98%   BMI 17.90 kg/m     Review of Systems  Constitutional: Positive for fatigue. Negative for chills and fever.  HENT: Negative for congestion and trouble swallowing.   Respiratory: Negative for cough, chest tightness and shortness of breath.   Cardiovascular: Negative for chest pain and leg swelling.  Gastrointestinal: Positive for diarrhea. Negative for abdominal pain, blood in stool, constipation, nausea and vomiting.  Genitourinary: Negative for difficulty urinating.  Musculoskeletal: Negative for arthralgias.  Neurological: Negative for dizziness, syncope, speech difficulty and light-headedness.  Hematological: Bruises/bleeds easily.  Psychiatric/Behavioral: Negative for agitation and behavioral problems.       Negative depression/anxiety concerns.       Objective:   Physical Exam Vitals reviewed.  Constitutional:      General: He is not in acute distress.    Comments: Cachectic in appearance.  HENT:     Head: Normocephalic.  Cardiovascular:     Rate and Rhythm: Normal rate and regular rhythm.     Heart sounds: Murmur present.  Pulmonary:     Effort: Pulmonary effort is normal.     Breath sounds: Normal breath sounds.  Abdominal:     General: Abdomen is flat. Bowel sounds are normal.  Palpations: Abdomen is soft.  Musculoskeletal:        General: No swelling.     Cervical back: Normal range of motion.  Skin:    General: Skin is warm and dry.     Comments: CABG and chest tube scabs healing without signs of infection.    Neurological:     General: No focal deficit present.     Mental Status: He is alert and oriented to person, place, and time.  Psychiatric:        Mood and Affect: Mood normal.        Behavior: Behavior normal.        Assessment & Plan:   79 yo male with no prior PMH was seen in Jan for MI and had extensive cardiac surgery with CABG x3 and aortic valve repair. He has needed thoracentesis. Multiple new medications and actively followed by Cardiology and Cardiac surgical team. He is here for first visit to establish care. He is a home now and having PT for rehab for strength training. His DTR moved in and is helping him. He was fully independent  and physically active before his MI event. He reports being overwhelmed by his new health issues.   1. He developed mild diarrhea in Feb while in rehab. He continues to have chief complaint of diarrhea. Will check stool for C diff and comprehensive culture with PCR at [facility] lab as the turn around time is very quick. If these are negative, then look at med list- many new medications were started. Check TSH next lab draw.    2. Insomnia :  We discussed the different options to include: good sleep hygiene practices. Keep a  regular bedtime, avoidance of all TV electronics for 90 minutes before bed.  Avoid all caffeine products after noon.Try warm bath before bed and read before bed. Try the melatonin again. He doesn't like trazodone-makes him groggy and sleepy the next day, keeps him awake at night.   3. Weight loss: He weighed only 120 lb before the surgery. Now, he weighs 107 lbs at home. He must push calories- we discussed. He does not eat high calorie food. Appetite is good. He thinks he will gain weight when his stools return to normal. Boost/Ensure if the dairy and sugar does not make diarrhea worse. Megace was added by his cardiac team.   4. Anemia: Post op - He had blood transfusion most recent 02/01/20. Consider baseline iron panel, ferritin, retic,  next lab draw. His MCV is elevated, but  B12 1,238 in Feb.  If low iron stores,  consider adding oral iron therapy. He says he wants to take this one step at a time.   5. AKI: Improving slowly from post op.  Consider renal consult if persists.  6. Elevated LFTS- improving from post op. RUQ abd US showed grossly normal liver, portal vein patent with normal direction of blood flow towards the liver on doppler. Trace ascites and small right pleural effusion.   7. Recommend the Covid vaccine if his cardiac team is in agreement.   8. Follow up visit suggested for 3 mos. Change that to 1 month for closer monitoring if he is in agreement.   This visit occurred during the SARS-CoV-2 public health emergency.  Safety protocols were in place, including screening questions prior to the visit, additional usage of staff PPE, and extensive cleaning of exam room while observing appropriate contact time as indicated for disinfecting solutions.    Denice Paradise, ANP

## 2020-02-08 NOTE — Progress Notes (Signed)
[address]       [place] [address]             [phone]     CARDIOTHORACIC SURGERY OFFICE NOTE  Referring Provider is Larey Dresser, MD Primary Cardiologist is No primary care provider on file. PCP is Marval Regal, NP   HPI:  79 yo man underwent AVR/CABG for NSTEMI in late Jan 2021. He was discharged from hosp to inpatient rehab and did reasonably well but did require thoracentesis from each chest at least once. Now presents for routine outpatient f/u. C/o frequent loose stools and continued weight loss. He has a stool culture pending. Denies chest pain or SOB.   Current Outpatient Medications  Medication Sig Dispense Refill  . acetaminophen (TYLENOL) 325 MG tablet Take 1-2 tablets (325-650 mg total) by mouth every 4 (four) hours as needed for mild pain.    Marland Kitchen amiodarone (PACERONE) 200 MG tablet Take 1 tablet (200 mg total) by mouth daily. 30 tablet 1  . apixaban (ELIQUIS) 2.5 MG TABS tablet Take 1 tablet (2.5 mg total) by mouth every 12 (twelve) hours. 60 tablet 1  . aspirin 81 MG chewable tablet Chew 1 tablet (81 mg total) by mouth daily.    Marland Kitchen atorvastatin (LIPITOR) 80 MG tablet Take 1 tablet (80 mg total) by mouth daily at 6 PM. 30 tablet 1  . digoxin (LANOXIN) 0.125 MG tablet Take 0.0625 mg by mouth daily.    . furosemide (LASIX) 20 MG tablet Take 1 tablet (20 mg total) by mouth daily. 30 tablet 1  . losartan (COZAAR) 25 MG tablet Take 0.5 tablets (12.5 mg total) by mouth at bedtime. 30 tablet 0  . Melatonin 3 MG TABS Take 2 tablets (6 mg total) by mouth at bedtime.  0  . potassium chloride (KLOR-CON) 10 MEQ tablet Take 1 tablet (10 mEq total) by mouth daily. 30 tablet 1  . spironolactone (ALDACTONE) 25 MG tablet Take 0.5 tablets (12.5 mg total) by mouth at bedtime. 30 tablet 0  . traZODone (DESYREL) 50 MG tablet Take 0.5-1 tablets (25-50 mg total) by mouth at bedtime as needed for sleep. 15 tablet 0   No current facility-administered  medications for this visit.      Physical Exam:   BP 119/72 (BP Location: Right Arm, Patient Position: Sitting, Cuff Size: Normal)   Pulse 63   Temp 97.7 F (36.5 C) (Temporal)   Ht 5\' 8"  (1.727 m)   Wt 49 kg   SpO2 97% Comment: RA  BMI 16.42 kg/m   General:  Elderly, NAD  Chest:   cta  CV:   rrr  Incisions:  C/d/i  Abdomen:  sntnd  Extremities:  No edema  Diagnostic Tests:  cxr w/clear lung fields   Impression:  Doing well hemodynamically but close to "failure to thrive" based on weight loss and poor appetite.   Plan:  F/u stool cultures Encourage po's Entered prescription for Megace.  F/u in 2-3 weeks.  I spent in excess of 20 minutes during the conduct of this office consultation and >50% of this time involved direct face-to-face encounter with the patient for counseling and/or coordination of their care.  Level 2                 10 minutes Level 3                 15 minutes Level 4  25 minutes Level 5                 40 minutes  B. Murvin Natal, MD 02/08/2020 2:02 PM

## 2020-02-10 ENCOUNTER — Other Ambulatory Visit
Admission: RE | Admit: 2020-02-10 | Discharge: 2020-02-10 | Disposition: A | Discharge status: Home / Self Care | Payer: Medicare Other | Source: Ambulatory Visit | Attending: Nurse Practitioner | Admitting: Nurse Practitioner

## 2020-02-10 DIAGNOSIS — R197 Diarrhea, unspecified: Secondary | ICD-10-CM | POA: Insufficient documentation

## 2020-02-10 LAB — C DIFFICILE QUICK SCREEN W PCR REFLEX
C Diff antigen: NEGATIVE
C Diff interpretation: NOT DETECTED
C Diff toxin: NEGATIVE

## 2020-02-14 LAB — GI PATHOGEN PANEL BY PCR, STOOL

## 2020-02-16 ENCOUNTER — Telehealth: Payer: Self-pay | Admitting: Nurse Practitioner

## 2020-02-16 DIAGNOSIS — R5381 Other malaise: Secondary | ICD-10-CM

## 2020-02-16 DIAGNOSIS — R197 Diarrhea, unspecified: Secondary | ICD-10-CM

## 2020-02-16 DIAGNOSIS — R634 Abnormal weight loss: Secondary | ICD-10-CM

## 2020-02-16 NOTE — Telephone Encounter (Signed)
Pt wanted a call back about a stool specimen that he dropped off

## 2020-02-16 NOTE — Telephone Encounter (Signed)
I spoke to him today. Has diarrhea x3 per day. No nocturnal . Poor appetite following heart surgery. Stool studies all neg for C diff, and bacterial. He has not picked up Megace, yet. And will do so. I placed a GI referral for continued symptoms and recommendations.

## 2020-02-16 NOTE — Telephone Encounter (Signed)
Looks as though the results are normal please advise.

## 2020-02-17 ENCOUNTER — Encounter: Payer: Self-pay | Admitting: Physician Assistant

## 2020-02-17 NOTE — Telephone Encounter (Signed)
See previous telephone encounter note.

## 2020-02-18 ENCOUNTER — Telehealth: Payer: Self-pay | Admitting: Nurse Practitioner

## 2020-02-18 DIAGNOSIS — N179 Acute kidney failure, unspecified: Secondary | ICD-10-CM

## 2020-02-18 DIAGNOSIS — R748 Abnormal levels of other serum enzymes: Secondary | ICD-10-CM

## 2020-02-18 DIAGNOSIS — D649 Anemia, unspecified: Secondary | ICD-10-CM

## 2020-02-18 DIAGNOSIS — D72829 Elevated white blood cell count, unspecified: Secondary | ICD-10-CM

## 2020-02-18 NOTE — Telephone Encounter (Signed)
Dr. Nicki Reaper and I discussed this case. He had weight loss- he said was intentional- before the heart surgery 12/2019.   1. She agrees with GI referral as planned and would have a low threshold for imaging.  He had a limited RUQ [name] and left lobe of liver not well seen, trace ascites and small right pleural effusion post op CABG. Elevated LFT, GFR post surgery-trending down.  2. We need to follow his lab trends and please ask him to come in Fri or early next week  for liver panel, Bmet, CBC with diff .

## 2020-02-19 NOTE — Telephone Encounter (Signed)
FYI spoke with patient's daughter per DPR patient has appointment next week with GI & he is scheduled for labs Monday @ 10:45.

## 2020-02-22 ENCOUNTER — Other Ambulatory Visit: Payer: Self-pay

## 2020-02-22 ENCOUNTER — Telehealth: Payer: Self-pay | Admitting: Nurse Practitioner

## 2020-02-22 ENCOUNTER — Other Ambulatory Visit: Payer: Medicare Other

## 2020-02-22 DIAGNOSIS — D649 Anemia, unspecified: Secondary | ICD-10-CM

## 2020-02-22 DIAGNOSIS — D7289 Other specified disorders of white blood cells: Secondary | ICD-10-CM

## 2020-02-22 DIAGNOSIS — D72829 Elevated white blood cell count, unspecified: Secondary | ICD-10-CM

## 2020-02-22 DIAGNOSIS — N179 Acute kidney failure, unspecified: Secondary | ICD-10-CM | POA: Diagnosis not present

## 2020-02-22 DIAGNOSIS — R748 Abnormal levels of other serum enzymes: Secondary | ICD-10-CM

## 2020-02-22 LAB — HEPATIC FUNCTION PANEL
ALT: 30 U/L (ref 0–53)
AST: 27 U/L (ref 0–37)
Albumin: 3.5 g/dL (ref 3.5–5.2)
Alkaline Phosphatase: 92 U/L (ref 39–117)
Bilirubin, Direct: 0.3 mg/dL (ref 0.0–0.3)
Total Bilirubin: 1.4 mg/dL — ABNORMAL HIGH (ref 0.2–1.2)
Total Protein: 7.2 g/dL (ref 6.0–8.3)

## 2020-02-22 LAB — CBC WITH DIFFERENTIAL/PLATELET
Basophils Absolute: 0.2 10*3/uL — ABNORMAL HIGH (ref 0.0–0.1)
Basophils Relative: 2.3 % (ref 0.0–3.0)
Eosinophils Absolute: 0.2 10*3/uL (ref 0.0–0.7)
Eosinophils Relative: 2.8 % (ref 0.0–5.0)
HCT: 33.8 % — ABNORMAL LOW (ref 39.0–52.0)
Hemoglobin: 11.3 g/dL — ABNORMAL LOW (ref 13.0–17.0)
Lymphocytes Relative: 19.5 % (ref 12.0–46.0)
Lymphs Abs: 1.7 10*3/uL (ref 0.7–4.0)
MCHC: 33.3 g/dL (ref 30.0–36.0)
MCV: 104.3 fl — ABNORMAL HIGH (ref 78.0–100.0)
Monocytes Absolute: 1.2 10*3/uL — ABNORMAL HIGH (ref 0.1–1.0)
Monocytes Relative: 14.2 % — ABNORMAL HIGH (ref 3.0–12.0)
Neutro Abs: 5.4 10*3/uL (ref 1.4–7.7)
Neutrophils Relative %: 61.2 % (ref 43.0–77.0)
Platelets: 296 10*3/uL (ref 150.0–400.0)
RBC: 3.25 Mil/uL — ABNORMAL LOW (ref 4.22–5.81)
RDW: 23.2 % — ABNORMAL HIGH (ref 11.5–15.5)
WBC: 8.7 10*3/uL (ref 4.0–10.5)

## 2020-02-22 LAB — BASIC METABOLIC PANEL
BUN: 21 mg/dL (ref 6–23)
CO2: 26 mEq/L (ref 19–32)
Calcium: 9.3 mg/dL (ref 8.4–10.5)
Chloride: 105 mEq/L (ref 96–112)
Creatinine, Ser: 1.3 mg/dL (ref 0.40–1.50)
GFR: 52.97 mL/min — ABNORMAL LOW (ref >60.00)
Glucose, Bld: 112 mg/dL — ABNORMAL HIGH (ref 70–99)
Potassium: 3.7 mEq/L (ref 3.5–5.1)
Sodium: 137 mEq/L (ref 135–145)

## 2020-02-22 NOTE — Telephone Encounter (Signed)
Patients daughter informed and verbalized understanding

## 2020-02-22 NOTE — Telephone Encounter (Signed)
Yes, he can take his statin tomorrow and his Lasix today. Reassure him- no harm.

## 2020-02-22 NOTE — Telephone Encounter (Signed)
Patient came in to office this morning as he got his medications mixed up. Pt is not having any symptoms or feeling bad. States he took his Atorvastatin this morning instead of night time at 6 pm.   Pt normally takes Lasix in the morning but has not taken this as he was worried about it being with his Atorvastatin.   Is it okay for patient to take his Lasix this morning and switch back to Atorvastatin at night tomorrow 02/23/20?   Please advise

## 2020-02-23 ENCOUNTER — Telehealth: Payer: Self-pay | Admitting: Nurse Practitioner

## 2020-02-23 NOTE — Telephone Encounter (Signed)
Did you call patient?

## 2020-02-23 NOTE — Telephone Encounter (Signed)
I spoke to his DTR and I think David Perez was to call him back and tell him I had my question answered. You could call  him to make sure.

## 2020-02-23 NOTE — Telephone Encounter (Signed)
Pt called and said that he missed a phone call from [place]

## 2020-02-24 NOTE — Progress Notes (Signed)
Advanced Heart Failure Clinic Note   Referring Physician: PCP: Marval Regal, NP PCP-Cardiologist: Dr. Aundra Dubin CT Surgeon: Dr. Orvan Seen  HPI: 79 y/o male recently admitted Jan 2021 for acute STEMI c/b cardiogenic shock, requiring IABP, and found to have multivessel disease w/ LM involvement, ischemic CM w/ EF 30%, severe aortic stenosis and atrial fibrillatoin. He underwent CABG w/ LIMA-LAD, SVG-OM2, SVG-LPDA + bioprosthetic AVR and Maze procedure + LAA clip. Post-op required milrinone for RV support. Post-op course, developed left pleural effusion requiring thoracentesis. Once medically stable, he was transferred to [facility] for therapy and ultimately discharged home on 2/26.   Today he returns for HF follow up with his daughter. Last visit coreg was stopped due to bradycardia. Overall feeling fair. Recently started on megace which he says has helped but still not much of an appetite. SOB with minimal exertion. Denies PND/Orthopnea. No bleeding issues. Poor appetite ok. No fever or chills. He has been weighing intermittently. Most recent weight 107-108 pounds. Taking all medications.     Past Medical History:  Diagnosis Date  . 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 Hepatitis B     Current Outpatient Medications  Medication Sig Dispense Refill  . acetaminophen (TYLENOL) 325 MG tablet Take 1-2 tablets (325-650 mg total) by mouth every 4 (four) hours as needed for mild pain.    Marland Kitchen amiodarone (PACERONE) 200 MG tablet Take 1 tablet (200 mg total) by mouth daily. 30 tablet 1  . apixaban (ELIQUIS) 2.5 MG TABS tablet Take 1 tablet (2.5 mg total) by mouth every 12 (twelve) hours. 60 tablet 1  . aspirin 81 MG chewable tablet Chew 1 tablet (81 mg total) by mouth daily.    Marland Kitchen atorvastatin (LIPITOR) 80 MG tablet Take 1 tablet (80 mg total) by mouth daily at 6 PM. 30 tablet 1  . digoxin (LANOXIN) 0.125 MG tablet Take 0.0625 mg by mouth daily.    . furosemide (LASIX) 20 MG tablet Take 1  tablet (20 mg total) by mouth daily. 30 tablet 1  . losartan (COZAAR) 25 MG tablet Take 0.5 tablets (12.5 mg total) by mouth at bedtime. 30 tablet 0  . megestrol (MEGACE) 20 MG tablet Take 1 tablet (20 mg total) by mouth daily. 30 tablet 2  . Melatonin 3 MG TABS Take 2 tablets (6 mg total) by mouth at bedtime.  0  . potassium chloride (KLOR-CON) 10 MEQ tablet Take 1 tablet (10 mEq total) by mouth daily. 30 tablet 1  . spironolactone (ALDACTONE) 25 MG tablet Take 0.5 tablets (12.5 mg total) by mouth at bedtime. 30 tablet 0  . traZODone (DESYREL) 50 MG tablet Take 0.5-1 tablets (25-50 mg total) by mouth at bedtime as needed for sleep. 15 tablet 0   No current facility-administered medications for this encounter.    No Known Allergies    Social History   Socioeconomic History  . Marital status: Widowed    Spouse name: Not on file  . Number of children: Not on file  . Years of education: Not on file  . Highest education level: Not on file  Occupational History  . Not on file  Tobacco Use  . Smoking status: Former Smoker    Packs/day: 0.50    Years: 40.00    Pack years: 20.00    Quit date: 1996    Years since quitting: 25.2  . Smokeless tobacco: Never Used  Substance and Sexual Activity  . Alcohol use: Not Currently  . Drug  use: Not Currently  . Sexual activity: Not Currently  Other Topics Concern  . Not on file  Social History Narrative  . Not on file   Social Determinants of Health   Financial Resource Strain:   . Difficulty of Paying Living Expenses:   Food Insecurity:   . Worried About Charity fundraiser in the Last Year:   . Arboriculturist in the Last Year:   Transportation Needs:   . Film/video editor (Medical):   Marland Kitchen Lack of Transportation (Non-Medical):   Physical Activity:   . Days of Exercise per Week:   . Minutes of Exercise per Session:   Stress:   . Feeling of Stress :   Social Connections:   . Frequency of Communication with Friends and Family:     . Frequency of Social Gatherings with Friends and Family:   . Attends Religious Services:   . Active Member of Clubs or Organizations:   . Attends Archivist Meetings:   Marland Kitchen Marital Status:   Intimate Partner Violence:   . Fear of Current or Ex-Partner:   . Emotionally Abused:   Marland Kitchen Physically Abused:   . Sexually Abused:       Family History  Problem Relation Age of Onset  . Cancer Father     Vitals:   02/25/20 0914  BP: 114/72  Pulse: 72  SpO2: 90%  Weight: 48.4 kg (106 lb 12.8 oz)   Wt Readings from Last 3 Encounters:  02/25/20 48.4 kg (106 lb 12.8 oz)  02/08/20 49 kg (108 lb)  02/08/20 49.5 kg (109 lb 3.2 oz)     PHYSICAL EXAM: General:  Appears chronically ill. Arrived in a wheel chair. Thin. No resp difficulty HEENT: normal Neck: supple. no JVD. Carotids 2+ bilat; no bruits. No lymphadenopathy or thryomegaly appreciated. Cor: PMI nondisplaced. Regular rate & rhythm. No rubs, gallops or murmurs. Lungs: clear Abdomen: soft, nontender, nondistended. No hepatosplenomegaly. No bruits or masses. Good bowel sounds. Extremities: no cyanosis, clubbing, rash, edema Neuro: alert & orientedx3, cranial nerves grossly intact. moves all 4 extremities w/o difficulty. Affect pleasant  ECG: NSR 72 bpm    ASSESSMENT & PLAN:  1. CAD: S/p STEMI 12/2019 c/b cardiogenic shock requiring IABP. LHC with very short LM and 99% ostial LAD. PCI thought to risk compromise of ostial large LCx, so decision made to defer PCI and treat surgically. Now s/p CABG with LIMA-LAD, SVG-OM2, SVG-LPDA.  - stable. No chest pain.  - Continue ASA.  -Continue atorvastatin 16m daily 2. Chronic systolic CHF: Ischemic cardiomyopathy.  Pre-op echo with EF 35%.  Now s/p CABG-AVR.  Echo post-op with EF 30%, RV dysfunction. - NYHA  III. Volume status stable. Does not need lasix.  - Continue spironolactone 12.5 daily   - Continue digoxin 0.0625 daily, dig level ok 0.7 (2/23). Dig level 02/02/20 0.8  -  Continue losartan 12.5 mg daily - Off coreg due to bradycardia w/ HR in the 40s and soft BP   - Plan to repeat ECHO in 3 months.  3. Atrial fibrillation: S/p Maze, LA appendage clip.  Amiodarone was stopped 2/22 but restarted given SVT runs (?atrial fibrillation) and accelerated junctional rhythm on ECG 2/24   - Stop amiodarone today. This may help with his appetite. Maintaining NSR. - Continue eliquis 2.5 mg twice a day. Lower dose with age and weight. No bleeding issues.   4. Hyperlipidemia: LDL 187. Goal <70.  - Continue atorvastatin 80 qhs.  -Check lipids  next visit.  If not at goal will need addition of Zetia +/- PCSK9i  5. Left pleural effusion: S/P Left thoracentesis 2/11 w/ 1.4 liters removed.  Repeat CXR 2/22 showed improving left pleural effusion and stable small rt pleural effusion.  Resolved.  7. Renal Insuffiencey: developed AKI during recent hospitalization.  12. 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  CRP up but not ESR.   - He was treated w/ 2 wk course of colchicine.  13.  Bradycardia:  - Last visit bb stopped.  -Resolved.  14. Deconditioning Continue HHPT.    Started process for patient assistance with eliquis. Provided samples today.  Follow up in 6 weeks with APP and 12 weeks with Dr Aundra Dubin + ECHO.   Follow up 6 weeks  With APP and 3 months with Dr Aundra Dubin 3 months + ECHO   Darrick Grinder, NP 02/25/20

## 2020-02-24 NOTE — Telephone Encounter (Signed)
Patient aware.

## 2020-02-25 ENCOUNTER — Ambulatory Visit (HOSPITAL_COMMUNITY)
Admission: RE | Admit: 2020-02-25 | Discharge: 2020-02-25 | Disposition: A | Discharge status: Home / Self Care | Payer: Medicare Other | Source: Ambulatory Visit | Attending: Adult Health | Admitting: Adult Health

## 2020-02-25 ENCOUNTER — Encounter (HOSPITAL_COMMUNITY): Payer: Self-pay

## 2020-02-25 ENCOUNTER — Other Ambulatory Visit: Payer: Self-pay

## 2020-02-25 VITALS — BP 114/72 | HR 72 | Wt 106.8 lb

## 2020-02-25 DIAGNOSIS — I255 Ischemic cardiomyopathy: Secondary | ICD-10-CM | POA: Diagnosis not present

## 2020-02-25 DIAGNOSIS — I480 Paroxysmal atrial fibrillation: Secondary | ICD-10-CM

## 2020-02-25 DIAGNOSIS — Z7982 Long term (current) use of aspirin: Secondary | ICD-10-CM | POA: Insufficient documentation

## 2020-02-25 DIAGNOSIS — Z79899 Other long term (current) drug therapy: Secondary | ICD-10-CM | POA: Diagnosis not present

## 2020-02-25 DIAGNOSIS — I4891 Unspecified atrial fibrillation: Secondary | ICD-10-CM | POA: Insufficient documentation

## 2020-02-25 DIAGNOSIS — Z87891 Personal history of nicotine dependence: Secondary | ICD-10-CM | POA: Insufficient documentation

## 2020-02-25 DIAGNOSIS — N289 Disorder of kidney and ureter, unspecified: Secondary | ICD-10-CM | POA: Diagnosis not present

## 2020-02-25 DIAGNOSIS — I2510 Atherosclerotic heart disease of native coronary artery without angina pectoris: Secondary | ICD-10-CM | POA: Insufficient documentation

## 2020-02-25 DIAGNOSIS — R5381 Other malaise: Secondary | ICD-10-CM

## 2020-02-25 DIAGNOSIS — Z951 Presence of aortocoronary bypass graft: Secondary | ICD-10-CM | POA: Insufficient documentation

## 2020-02-25 DIAGNOSIS — I5022 Chronic systolic (congestive) heart failure: Secondary | ICD-10-CM | POA: Diagnosis present

## 2020-02-25 DIAGNOSIS — I252 Old myocardial infarction: Secondary | ICD-10-CM | POA: Diagnosis not present

## 2020-02-25 DIAGNOSIS — Z953 Presence of xenogenic heart valve: Secondary | ICD-10-CM | POA: Insufficient documentation

## 2020-02-25 DIAGNOSIS — E785 Hyperlipidemia, unspecified: Secondary | ICD-10-CM | POA: Insufficient documentation

## 2020-02-25 DIAGNOSIS — Z7901 Long term (current) use of anticoagulants: Secondary | ICD-10-CM | POA: Diagnosis not present

## 2020-02-25 DIAGNOSIS — Z8619 Personal history of other infectious and parasitic diseases: Secondary | ICD-10-CM | POA: Diagnosis not present

## 2020-02-25 MED ORDER — FUROSEMIDE 20 MG PO TABS: 20.0000 mg | ORAL_TABLET | Freq: Every day | ORAL | 3 refills | Status: DC

## 2020-02-25 MED ORDER — DIGOXIN 125 MCG PO TABS: 0.0625 mg | ORAL_TABLET | Freq: Every day | ORAL | 3 refills | Status: DC

## 2020-02-25 MED ORDER — POTASSIUM CHLORIDE CRYS ER 10 MEQ PO TBCR: 10.0000 meq | EXTENDED_RELEASE_TABLET | Freq: Every day | ORAL | 3 refills | Status: DC

## 2020-02-25 MED ORDER — ATORVASTATIN CALCIUM 80 MG PO TABS: 80.0000 mg | ORAL_TABLET | Freq: Every day | ORAL | 3 refills | Status: AC

## 2020-02-25 MED ORDER — APIXABAN 2.5 MG PO TABS: 2.5000 mg | ORAL_TABLET | Freq: Two times a day (BID) | ORAL | 3 refills | Status: AC

## 2020-02-25 MED ORDER — SPIRONOLACTONE 25 MG PO TABS: 12.5000 mg | ORAL_TABLET | Freq: Every day | ORAL | 6 refills | Status: DC

## 2020-02-25 MED ORDER — ASPIRIN 81 MG PO CHEW: 81.0000 mg | CHEWABLE_TABLET | Freq: Every day | ORAL | 3 refills | Status: DC

## 2020-02-25 MED ORDER — LOSARTAN POTASSIUM 25 MG PO TABS: 12.5000 mg | ORAL_TABLET | Freq: Every day | ORAL | 3 refills | Status: DC

## 2020-02-25 MED ORDER — SPIRONOLACTONE 25 MG PO TABS: 12.5000 mg | ORAL_TABLET | Freq: Every day | ORAL | 3 refills | Status: DC

## 2020-02-25 NOTE — Progress Notes (Signed)
Medication Samples have been provided to the patient.  Drug name: Eliquis       Strength: 5mg         Qty: 4  LOT: [id number]  Exp.Date: 3/23  Dosing instructions: Take 1/2 tab Twice daily   The patient has been instructed regarding the correct time, dose, and frequency of taking this medication, including desired effects and most common side effects.   Zamarah Ullmer 9:52 AM 02/25/2020

## 2020-02-25 NOTE — Addendum Note (Signed)
Encounter addended by: Kerry Dory, CMA on: 02/25/2020 2:33 PM  Actions taken: Order list changed

## 2020-02-25 NOTE — Patient Instructions (Signed)
Stop Amiodarone   Your physician recommends that you schedule a follow-up appointment in: 6 weeks with APP  Please call our office in July to schedule your 3 month follow up and echocardiogram  If you have any questions or concerns before your next appointment please send [name] a message through [place] or call our office at [phone].  At the [facility], you and your health needs are our priority. As part of our continuing mission to provide you with exceptional heart care, we have created designated Provider Care Teams. These Care Teams include your primary Cardiologist (physician) and Advanced Practice Providers (APPs- Physician Assistants and Nurse Practitioners) who all work together to provide you with the care you need, when you need it.   You may see any of the following providers on your designated Care Team at your next follow up: Marland Kitchen Dr Glori Bickers . Dr Loralie Champagne . Darrick Grinder, NP . Lyda Jester, PA . Audry Riles, PharmD   Please be sure to bring in all your medications bottles to every appointment.

## 2020-02-26 ENCOUNTER — Ambulatory Visit: Payer: Medicare Other | Admitting: Physician Assistant

## 2020-02-26 ENCOUNTER — Other Ambulatory Visit: Payer: Self-pay | Admitting: Cardiothoracic Surgery

## 2020-02-26 DIAGNOSIS — Z951 Presence of aortocoronary bypass graft: Secondary | ICD-10-CM

## 2020-02-29 ENCOUNTER — Ambulatory Visit (INDEPENDENT_AMBULATORY_CARE_PROVIDER_SITE_OTHER): Payer: Self-pay | Admitting: Cardiothoracic Surgery

## 2020-02-29 ENCOUNTER — Ambulatory Visit
Admission: RE | Admit: 2020-02-29 | Discharge: 2020-02-29 | Disposition: A | Discharge status: Home / Self Care | Payer: Medicare Other | Source: Ambulatory Visit | Attending: Cardiothoracic Surgery | Admitting: Cardiothoracic Surgery

## 2020-02-29 ENCOUNTER — Encounter: Payer: Self-pay | Admitting: Cardiothoracic Surgery

## 2020-02-29 ENCOUNTER — Other Ambulatory Visit: Payer: Self-pay

## 2020-02-29 VITALS — BP 104/66 | HR 75 | Temp 98.2°F | Resp 16 | Ht 69.0 in | Wt 106.8 lb

## 2020-02-29 DIAGNOSIS — Z9889 Other specified postprocedural states: Secondary | ICD-10-CM

## 2020-02-29 DIAGNOSIS — Z952 Presence of prosthetic heart valve: Secondary | ICD-10-CM

## 2020-02-29 DIAGNOSIS — Z951 Presence of aortocoronary bypass graft: Secondary | ICD-10-CM

## 2020-02-29 DIAGNOSIS — J90 Pleural effusion, not elsewhere classified: Secondary | ICD-10-CM

## 2020-02-29 NOTE — Progress Notes (Signed)
[address]       [place] [address]             [phone]     CARDIOTHORACIC SURGERY OFFICE NOTE  Referring Provider is Larey Dresser, MD Primary Cardiologist is No primary care provider on file. PCP is Marval Regal, NP   HPI:  79 year old gentleman presented with STEMI and underwent urgent aortic valve replacement and coronary bypass grafting.  He did well although he did require an inpatient rehab stint.  He has been followed as an outpatient now for a few weeks.  Today he states his appetite is improved with Megace.  He denies chest pain or shortness of breath.   Current Outpatient Medications  Medication Sig Dispense Refill  . acetaminophen (TYLENOL) 325 MG tablet Take 1-2 tablets (325-650 mg total) by mouth every 4 (four) hours as needed for mild pain.    Marland Kitchen apixaban (ELIQUIS) 2.5 MG TABS tablet Take 1 tablet (2.5 mg total) by mouth every 12 (twelve) hours. 180 tablet 3  . aspirin 81 MG chewable tablet Chew 1 tablet (81 mg total) by mouth daily. 90 tablet 3  . atorvastatin (LIPITOR) 80 MG tablet Take 1 tablet (80 mg total) by mouth daily at 6 PM. 90 tablet 3  . digoxin (LANOXIN) 0.125 MG tablet Take 0.5 tablets (0.0625 mg total) by mouth daily. 45 tablet 3  . furosemide (LASIX) 20 MG tablet Take 1 tablet (20 mg total) by mouth daily. 90 tablet 3  . losartan (COZAAR) 25 MG tablet Take 0.5 tablets (12.5 mg total) by mouth at bedtime. 45 tablet 3  . megestrol (MEGACE) 20 MG tablet Take 1 tablet (20 mg total) by mouth daily. 30 tablet 2  . Melatonin 3 MG TABS Take 2 tablets (6 mg total) by mouth at bedtime.  0  . potassium chloride (KLOR-CON) 10 MEQ tablet Take 1 tablet (10 mEq total) by mouth daily. 90 tablet 3  . spironolactone (ALDACTONE) 25 MG tablet Take 0.5 tablets (12.5 mg total) by mouth at bedtime. 45 tablet 3  . traZODone (DESYREL) 50 MG tablet Take 0.5-1 tablets (25-50 mg total) by mouth at bedtime as needed for sleep. 15 tablet 0   No  current facility-administered medications for this visit.      Physical Exam:   BP 104/66 (BP Location: Left Arm, Patient Position: Sitting, Cuff Size: Normal)   Pulse 75   Temp 98.2 F (36.8 C)   Resp 16   Ht 5\' 9"  (1.753 m)   Wt 48.4 kg   SpO2 98% Comment: RA  BMI 15.77 kg/m   General:  Well appearing no acute distress  Chest:   CTA  CV:   RRR  Incisions:  Well-healed  Abdomen:  SNTND  Extremities:  No edema  Diagnostic Tests:  Chest x-ray with clear lung fields   Impression:  79 year old man well in recovery from coronary bypass grafting and aortic valve replacement.  Plan:  Follow-up as needed.  Referral to cardiac rehab over the next few weeks.  I spent in excess of 20 minutes during the conduct of this office consultation and >50% of this time involved direct face-to-face encounter with the patient for counseling and/or coordination of their care.  Level 2                 10 minutes Level 3                 15  minutes Level 4                 25 minutes Level 5                 40 minutes  B. Murvin Natal, MD 02/29/2020 4:01 PM

## 2020-03-01 ENCOUNTER — Other Ambulatory Visit: Payer: Self-pay | Admitting: *Deleted

## 2020-03-01 ENCOUNTER — Other Ambulatory Visit: Payer: Self-pay

## 2020-03-01 DIAGNOSIS — Z951 Presence of aortocoronary bypass graft: Secondary | ICD-10-CM

## 2020-03-01 DIAGNOSIS — I213 ST elevation (STEMI) myocardial infarction of unspecified site: Secondary | ICD-10-CM

## 2020-03-01 DIAGNOSIS — Z952 Presence of prosthetic heart valve: Secondary | ICD-10-CM

## 2020-03-01 NOTE — Progress Notes (Signed)
ca

## 2020-03-11 ENCOUNTER — Ambulatory Visit: Payer: Medicare Other | Attending: Internal Medicine

## 2020-03-11 DIAGNOSIS — Z23 Encounter for immunization: Secondary | ICD-10-CM

## 2020-03-11 NOTE — Progress Notes (Signed)
   Covid-19 Vaccination Clinic  Name:  [name]    MRN: [id number] DOB: 15-May-1941  03/11/2020  [name] was observed post Covid-19 immunization for 15 minutes without incident. He was provided with Vaccine Information Sheet and instruction to access the V-Safe system.   [name] was instructed to call 911 with any severe reactions post vaccine: Marland Kitchen Difficulty breathing  . Swelling of face and throat  . A fast heartbeat  . A bad rash all over body  . Dizziness and weakness   Immunizations Administered    Name Date Dose VIS Date Route   Pfizer COVID-19 Vaccine 03/11/2020  4:12 PM 0.3 mL 11/13/2019 Intramuscular   Manufacturer: [place]   Lot: [id number]   [place]: [id number]

## 2020-03-16 ENCOUNTER — Other Ambulatory Visit: Payer: Self-pay | Admitting: Physical Medicine and Rehabilitation

## 2020-03-20 ENCOUNTER — Other Ambulatory Visit: Payer: Self-pay | Admitting: Physical Medicine and Rehabilitation

## 2020-04-05 ENCOUNTER — Ambulatory Visit: Payer: Medicare Other | Attending: Internal Medicine

## 2020-04-05 DIAGNOSIS — Z23 Encounter for immunization: Secondary | ICD-10-CM

## 2020-04-05 NOTE — Progress Notes (Signed)
   Covid-19 Vaccination Clinic  Name:  David Perez    MRN: [id number] DOB: 19-Oct-1941  04/05/2020  [name] was observed post Covid-19 immunization for 15 minutes without incident. He was provided with Vaccine Information Sheet and instruction to access the V-Safe system.   [name] was instructed to call 911 with any severe reactions post vaccine: Marland Kitchen Difficulty breathing  . Swelling of face and throat  . A fast heartbeat  . A bad rash all over body  . Dizziness and weakness   Immunizations Administered    Name Date Dose VIS Date Route   Pfizer COVID-19 Vaccine 04/05/2020 11:05 AM 0.3 mL 01/27/2019 Intramuscular   Manufacturer: [place]   Lot: [id number]   [place]: [id number]

## 2020-04-07 ENCOUNTER — Encounter (HOSPITAL_COMMUNITY): Payer: Self-pay

## 2020-04-07 ENCOUNTER — Other Ambulatory Visit: Payer: Self-pay

## 2020-04-07 ENCOUNTER — Ambulatory Visit (HOSPITAL_COMMUNITY)
Admission: RE | Admit: 2020-04-07 | Discharge: 2020-04-07 | Disposition: A | Discharge status: Home / Self Care | Payer: Medicare Other | Source: Ambulatory Visit | Attending: Internal Medicine | Admitting: Internal Medicine

## 2020-04-07 VITALS — BP 130/78 | HR 86 | Wt 110.2 lb

## 2020-04-07 DIAGNOSIS — Z951 Presence of aortocoronary bypass graft: Secondary | ICD-10-CM

## 2020-04-07 DIAGNOSIS — J90 Pleural effusion, not elsewhere classified: Secondary | ICD-10-CM | POA: Insufficient documentation

## 2020-04-07 DIAGNOSIS — I480 Paroxysmal atrial fibrillation: Secondary | ICD-10-CM

## 2020-04-07 DIAGNOSIS — Z79899 Other long term (current) drug therapy: Secondary | ICD-10-CM | POA: Diagnosis not present

## 2020-04-07 DIAGNOSIS — I252 Old myocardial infarction: Secondary | ICD-10-CM | POA: Insufficient documentation

## 2020-04-07 DIAGNOSIS — E785 Hyperlipidemia, unspecified: Secondary | ICD-10-CM | POA: Insufficient documentation

## 2020-04-07 DIAGNOSIS — I2510 Atherosclerotic heart disease of native coronary artery without angina pectoris: Secondary | ICD-10-CM | POA: Diagnosis not present

## 2020-04-07 DIAGNOSIS — I4891 Unspecified atrial fibrillation: Secondary | ICD-10-CM | POA: Insufficient documentation

## 2020-04-07 DIAGNOSIS — I5022 Chronic systolic (congestive) heart failure: Secondary | ICD-10-CM

## 2020-04-07 DIAGNOSIS — R5381 Other malaise: Secondary | ICD-10-CM

## 2020-04-07 DIAGNOSIS — Z87891 Personal history of nicotine dependence: Secondary | ICD-10-CM | POA: Insufficient documentation

## 2020-04-07 DIAGNOSIS — Z7901 Long term (current) use of anticoagulants: Secondary | ICD-10-CM | POA: Insufficient documentation

## 2020-04-07 DIAGNOSIS — E7849 Other hyperlipidemia: Secondary | ICD-10-CM

## 2020-04-07 DIAGNOSIS — Z7982 Long term (current) use of aspirin: Secondary | ICD-10-CM | POA: Diagnosis not present

## 2020-04-07 DIAGNOSIS — Z952 Presence of prosthetic heart valve: Secondary | ICD-10-CM | POA: Diagnosis not present

## 2020-04-07 DIAGNOSIS — I350 Nonrheumatic aortic (valve) stenosis: Secondary | ICD-10-CM | POA: Insufficient documentation

## 2020-04-07 DIAGNOSIS — N179 Acute kidney failure, unspecified: Secondary | ICD-10-CM | POA: Diagnosis not present

## 2020-04-07 DIAGNOSIS — I255 Ischemic cardiomyopathy: Secondary | ICD-10-CM | POA: Diagnosis not present

## 2020-04-07 MED ORDER — SPIRONOLACTONE 25 MG PO TABS: 25.0000 mg | ORAL_TABLET | Freq: Every day | ORAL | 3 refills | Status: DC

## 2020-04-07 NOTE — Progress Notes (Signed)
Advanced Heart Failure Clinic Note   Referring Physician: PCP: Marval Regal, NP PCP-Cardiologist: Dr. Aundra Dubin CT Surgeon: Dr. Orvan Seen  HPI: 79 y/o male recently admitted Jan 2021 for acute STEMI c/b cardiogenic shock, requiring IABP, and found to have multivessel disease w/ LM involvement, ischemic CM w/ EF 30%, severe aortic stenosis and atrial fibrillatoin. He underwent CABG w/ LIMA-LAD, SVG-OM2, SVG-LPDA + bioprosthetic AVR and Maze procedure + LAA clip. Post-op required milrinone for RV support. Post-op course, developed left pleural effusion requiring thoracentesis. Once medically stable, he was transferred to [facility] for therapy and ultimately discharged home on 01/29/20   Today he returns for HF follow up. Last visit amiodarone was stopped. Says he feels much better. Overall feeling fine. Denies SOB/PND/Orthopnea. No bleeding issues.  Appetite improved.  No fever or chills. Weight at home has been stable. Taking all medications. His daughter continues to help him with medications. All medications have been placed in a bubble pack.    ECHO  01/06/20 EF 30% RV severely hypokinetic.   LHC 12/31/19   Mid LM to Dist LM lesion is 90% stenosed.  Dist LM to Ost LAD lesion is 99% stenosed.  Mid LAD lesion is 50% stenosed.  Left ventriculogram not performed  Referred to CT Surgery  Past Medical History:  Diagnosis Date  . 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 Hepatitis B     Current Outpatient Medications  Medication Sig Dispense Refill  . acetaminophen (TYLENOL) 325 MG tablet Take 1-2 tablets (325-650 mg total) by mouth every 4 (four) hours as needed for mild pain.    Marland Kitchen apixaban (ELIQUIS) 2.5 MG TABS tablet Take 1 tablet (2.5 mg total) by mouth every 12 (twelve) hours. 180 tablet 3  . aspirin 81 MG chewable tablet Chew 1 tablet (81 mg total) by mouth daily. 90 tablet 3  . atorvastatin (LIPITOR) 80 MG tablet Take 1 tablet (80 mg total) by mouth daily at 6 PM. 90  tablet 3  . digoxin (LANOXIN) 0.125 MG tablet Take 0.5 tablets (0.0625 mg total) by mouth daily. 45 tablet 3  . furosemide (LASIX) 20 MG tablet Take 1 tablet (20 mg total) by mouth daily. 90 tablet 3  . losartan (COZAAR) 25 MG tablet Take 0.5 tablets (12.5 mg total) by mouth at bedtime. 45 tablet 3  . megestrol (MEGACE) 20 MG tablet Take 1 tablet (20 mg total) by mouth daily. 30 tablet 2  . Melatonin 3 MG TABS Take 2 tablets (6 mg total) by mouth at bedtime.  0  . potassium chloride (KLOR-CON) 10 MEQ tablet Take 1 tablet (10 mEq total) by mouth daily. 90 tablet 3  . spironolactone (ALDACTONE) 25 MG tablet Take 0.5 tablets (12.5 mg total) by mouth at bedtime. 45 tablet 3  . traZODone (DESYREL) 50 MG tablet Take 0.5-1 tablets (25-50 mg total) by mouth at bedtime as needed for sleep. 15 tablet 0   No current facility-administered medications for this encounter.    No Known Allergies    Social History   Socioeconomic History  . Marital status: Widowed    Spouse name: Not on file  . Number of children: Not on file  . Years of education: Not on file  . Highest education level: Not on file  Occupational History  . Not on file  Tobacco Use  . Smoking status: Former Smoker    Packs/day: 0.50    Years: 40.00    Pack years: 20.00    Quit  date: 62    Years since quitting: 25.3  . Smokeless tobacco: Never Used  Substance and Sexual Activity  . Alcohol use: Not Currently  . Drug use: Not Currently  . Sexual activity: Not Currently  Other Topics Concern  . Not on file  Social History Narrative  . Not on file   Social Determinants of Health   Financial Resource Strain:   . Difficulty of Paying Living Expenses:   Food Insecurity:   . Worried About Charity fundraiser in the Last Year:   . Arboriculturist in the Last Year:   Transportation Needs:   . Film/video editor (Medical):   Marland Kitchen Lack of Transportation (Non-Medical):   Physical Activity:   . Days of Exercise per Week:     . Minutes of Exercise per Session:   Stress:   . Feeling of Stress :   Social Connections:   . Frequency of Communication with Friends and Family:   . Frequency of Social Gatherings with Friends and Family:   . Attends Religious Services:   . Active Member of Clubs or Organizations:   . Attends Archivist Meetings:   Marland Kitchen Marital Status:   Intimate Partner Violence:   . Fear of Current or Ex-Partner:   . Emotionally Abused:   Marland Kitchen Physically Abused:   . Sexually Abused:       Family History  Problem Relation Age of Onset  . Cancer Father     Vitals:   04/07/20 1023  BP: 130/78  Pulse: 86  SpO2: 98%  Weight: 50 kg (110 lb 3.2 oz)   Wt Readings from Last 3 Encounters:  04/07/20 50 kg (110 lb 3.2 oz)  02/29/20 48.4 kg (106 lb 12.8 oz)  02/25/20 48.4 kg (106 lb 12.8 oz)     PHYSICAL EXAM: General:  Thin. Ambulated in the clinic without difficulty. No resp difficulty HEENT: normal Neck: supple. no JVD. Carotids 2+ bilat; no bruits. No lymphadenopathy or thryomegaly appreciated. Cor: PMI nondisplaced. Regular rate & rhythm. No rubs, gallops or murmurs. Lungs: clear Abdomen: soft, nontender, nondistended. No hepatosplenomegaly. No bruits or masses. Good bowel sounds. Extremities: no cyanosis, clubbing, rash, edema Neuro: alert & orientedx3, cranial nerves grossly intact. moves all 4 extremities w/o difficulty. Affect pleasant   ASSESSMENT & PLAN:  1. CAD: S/p STEMI 12/2019 c/b cardiogenic shock requiring IABP. LHC with very short LM and 99% ostial LAD. PCI thought to risk compromise of ostial large LCx, so decision made to defer PCI and treat surgically. Now s/p CABG with LIMA-LAD, SVG-OM2, SVG-LPDA.  - No chest pain.  - Stop aspirin as he is on eliquis. -Continue atorvastatin 7m daily - [facility]  2. Chronic systolic CHF: Ischemic cardiomyopathy.  Pre-op echo with EF 35%.  Now s/p CABG-AVR.  Echo post-op with EF 30%, RV  dysfunction. - NYHA II. Volume status stable. Does not need lasix.  - Increase spironolactone to 25 mg daily.  He will increase spiro in after he completes his current bubble pack of medications. He will then get BMET 7 days after with his PCP.   - Continue digoxin 0.0625 daily,  Dig level 02/02/20 0.8  - Continue losartan 12.5 mg daily - Off coreg due to bradycardia w/ HR in the 40s and soft BP   - Plan to repeat ECHO in June with Dr MAundra Dubin  3. Atrial fibrillation: S/p Maze, LA appendage clip.  Amiodarone was stopped 2/22  but restarted given SVT runs (?atrial fibrillation) and accelerated junctional rhythm on ECG 2/24   - Off amio and feels much better. Nausea resolved.  - Regular on exam.  - Continue eliquis 2.5 mg twice a day. Lower dose with age and weight. No bleeding issues.   4. Hyperlipidemia: LDL 187. Goal <70.  - Continue atorvastatin 80 qhs.  -Check lipids next month with his PCP.  If not at goal will need addition of Zetia +/- PCSK9i  5. Left pleural effusion: S/P Left thoracentesis 2/11 w/ 1.4 liters removed.  Repeat CXR 2/22 showed improving left pleural effusion and stable small rt pleural effusion.  Resolved.  7. Renal Insuffiencey: developed AKI during recent hospitalization.  12. 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  CRP up but not ESR.   - He was treated w/ 2 wk course of colchicine.  13.  Bradycardia:  - Last visit bb stopped.  -Resolved.   14. Deconditioning Completed HH. Now ready for cardiac rehab.   Follow up next month with Dr Aundra Dubin and an ECHO. Discussed med changes.     Darrick Grinder, NP 04/07/20

## 2020-04-07 NOTE — Patient Instructions (Addendum)
STOP Aspirin  INCREASE Spironolactone to 25mg  (1 tab) daily  Labs in 1 month at PCP's office. Script in system We will only contact you if something comes back abnormal or we need to make some changes. Otherwise no news is good news!  Keep your next appointment with Heart and Vascular clinic on Monday, June 28th, 2021 for an ECHO at [place] and office visit with Dr Aundra Dubin to follow  June Garage Code 5007  Please call office at [phone] option 2 if you have any questions or concerns.   At the [facility], you and your health needs are our priority. As part of our continuing mission to provide you with exceptional heart care, we have created designated Provider Care Teams. These Care Teams include your primary Cardiologist (physician) and Advanced Practice Providers (APPs- Physician Assistants and Nurse Practitioners) who all work together to provide you with the care you need, when you need it.   You may see any of the following providers on your designated Care Team at your next follow up: Marland Kitchen Dr Glori Bickers . Dr Loralie Champagne . Darrick Grinder, NP . Lyda Jester, PA . Audry Riles, PharmD   Please be sure to bring in all your medications bottles to every appointment.

## 2020-04-11 ENCOUNTER — Telehealth (HOSPITAL_COMMUNITY): Payer: Self-pay | Admitting: Pharmacist

## 2020-04-11 NOTE — Telephone Encounter (Signed)
Sent in Manufacturer's Assistance application to [employer] for Eliquis.    Application pending, will continue to follow.  Aivy Akter, PharmD, BCPS, BCCP, CPP Heart Failure Clinic Pharmacist [phone]   

## 2020-04-14 NOTE — Telephone Encounter (Signed)
Advanced Heart Failure Patient Advocate Encounter   Patient was approved to receive Eliquis from [employer].  Patient ID: [id number] Effective dates: 04/14/20 through 12/02/20  Audry Riles, PharmD, BCPS, BCCP, CPP Heart Failure Clinic Pharmacist [phone]

## 2020-04-18 ENCOUNTER — Other Ambulatory Visit (HOSPITAL_COMMUNITY): Payer: Self-pay | Admitting: *Deleted

## 2020-04-18 ENCOUNTER — Telehealth (HOSPITAL_COMMUNITY): Payer: Self-pay | Admitting: *Deleted

## 2020-04-18 MED ORDER — SPIRONOLACTONE 25 MG PO TABS: 25.0000 mg | ORAL_TABLET | Freq: Every day | ORAL | 3 refills | Status: AC

## 2020-04-18 NOTE — Telephone Encounter (Signed)
Patients daughter called stating pts updated prescription of spironolactone was not at his pharmacy. Pt uses [facility]. I resent the correct dose to [facility].

## 2020-04-19 ENCOUNTER — Telehealth (HOSPITAL_COMMUNITY): Payer: Self-pay | Admitting: *Deleted

## 2020-04-19 NOTE — Telephone Encounter (Signed)
pts daughter left VM making sure we sent the correct dose to [facility]. I explained to her that he is now taking a whole 25mg  tablet not a half but the medication has been sent. She thanked me for the call.

## 2020-04-29 ENCOUNTER — Telehealth (HOSPITAL_COMMUNITY): Payer: Self-pay | Admitting: Vascular Surgery

## 2020-04-29 NOTE — Telephone Encounter (Signed)
Left pt's daughter message to reschedule appt /w echo w/ [facility] 6/28,

## 2020-05-06 ENCOUNTER — Other Ambulatory Visit: Payer: Self-pay

## 2020-05-10 ENCOUNTER — Other Ambulatory Visit: Payer: Self-pay

## 2020-05-10 ENCOUNTER — Ambulatory Visit (INDEPENDENT_AMBULATORY_CARE_PROVIDER_SITE_OTHER): Payer: Medicare Other | Admitting: Nurse Practitioner

## 2020-05-10 ENCOUNTER — Encounter: Payer: Self-pay | Admitting: Nurse Practitioner

## 2020-05-10 VITALS — BP 130/68 | HR 70 | Temp 97.3°F | Ht 69.0 in | Wt 113.0 lb

## 2020-05-10 DIAGNOSIS — N179 Acute kidney failure, unspecified: Secondary | ICD-10-CM | POA: Diagnosis not present

## 2020-05-10 DIAGNOSIS — I2101 ST elevation (STEMI) myocardial infarction involving left main coronary artery: Secondary | ICD-10-CM | POA: Diagnosis not present

## 2020-05-10 DIAGNOSIS — D649 Anemia, unspecified: Secondary | ICD-10-CM

## 2020-05-10 DIAGNOSIS — I5021 Acute systolic (congestive) heart failure: Secondary | ICD-10-CM

## 2020-05-10 DIAGNOSIS — Z515 Encounter for palliative care: Secondary | ICD-10-CM | POA: Insufficient documentation

## 2020-05-10 DIAGNOSIS — R5381 Other malaise: Secondary | ICD-10-CM

## 2020-05-10 DIAGNOSIS — Z0000 Encounter for general adult medical examination without abnormal findings: Secondary | ICD-10-CM | POA: Insufficient documentation

## 2020-05-10 DIAGNOSIS — Z7901 Long term (current) use of anticoagulants: Secondary | ICD-10-CM

## 2020-05-10 DIAGNOSIS — Z7189 Other specified counseling: Secondary | ICD-10-CM | POA: Insufficient documentation

## 2020-05-10 LAB — LIPID PANEL
Cholesterol: 137 mg/dL (ref 0–200)
HDL: 48.6 mg/dL (ref >39.00)
LDL Cholesterol: 76 mg/dL (ref 0–99)
NonHDL: 88
Total CHOL/HDL Ratio: 3
Triglycerides: 58 mg/dL (ref 0.0–149.0)
VLDL: 11.6 mg/dL (ref 0.0–40.0)

## 2020-05-10 LAB — COMPREHENSIVE METABOLIC PANEL
ALT: 20 U/L (ref 0–53)
AST: 22 U/L (ref 0–37)
Albumin: 3.9 g/dL (ref 3.5–5.2)
Alkaline Phosphatase: 111 U/L (ref 39–117)
BUN: 22 mg/dL (ref 6–23)
CO2: 29 mEq/L (ref 19–32)
Calcium: 9.2 mg/dL (ref 8.4–10.5)
Chloride: 104 mEq/L (ref 96–112)
Creatinine, Ser: 1.15 mg/dL (ref 0.40–1.50)
GFR: 60.99 mL/min (ref >60.00)
Glucose, Bld: 104 mg/dL — ABNORMAL HIGH (ref 70–99)
Potassium: 3.7 mEq/L (ref 3.5–5.1)
Sodium: 139 mEq/L (ref 135–145)
Total Bilirubin: 1.1 mg/dL (ref 0.2–1.2)
Total Protein: 7.1 g/dL (ref 6.0–8.3)

## 2020-05-10 LAB — CBC WITH DIFFERENTIAL/PLATELET
Basophils Absolute: 0.1 10*3/uL (ref 0.0–0.1)
Basophils Relative: 0.7 % (ref 0.0–3.0)
Eosinophils Absolute: 0.2 10*3/uL (ref 0.0–0.7)
Eosinophils Relative: 2.3 % (ref 0.0–5.0)
HCT: 32.3 % — ABNORMAL LOW (ref 39.0–52.0)
Hemoglobin: 10.9 g/dL — ABNORMAL LOW (ref 13.0–17.0)
Lymphocytes Relative: 24.8 % (ref 12.0–46.0)
Lymphs Abs: 2.3 10*3/uL (ref 0.7–4.0)
MCHC: 33.9 g/dL (ref 30.0–36.0)
MCV: 107.3 fl — ABNORMAL HIGH (ref 78.0–100.0)
Monocytes Absolute: 1.1 10*3/uL — ABNORMAL HIGH (ref 0.1–1.0)
Monocytes Relative: 12.2 % — ABNORMAL HIGH (ref 3.0–12.0)
Neutro Abs: 5.6 10*3/uL (ref 1.4–7.7)
Neutrophils Relative %: 60 % (ref 43.0–77.0)
Platelets: 245 10*3/uL (ref 150.0–400.0)
RBC: 3.01 Mil/uL — ABNORMAL LOW (ref 4.22–5.81)
RDW: 20.5 % — ABNORMAL HIGH (ref 11.5–15.5)
WBC: 9.2 10*3/uL (ref 4.0–10.5)

## 2020-05-10 NOTE — Progress Notes (Signed)
Established Patient Office Visit  Subjective:  Patient ID: David Perez, male    DOB: 1941/08/24  Age: 79 y.o. MRN: [id number]  CC:  Chief Complaint  Patient presents with  . Follow-up   HPI David Perez is a 79y/o male  presents for follow-up visit.  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 He had  AVR/CABG in January 2021.  He had CHF, arrhythmia, postop thoracentesis.  2 subsequent hospital admissions for treatment with most recent admission 01/08/2020.  His daughter [name] moved in to help take care of him.  He was weak, having diarrhea (neg C diff), lost weight from baseline 122  Lbs to 107 pounds, recovering from AKI, shock liver, anemia requiring blood transfusion 02/01/2020.  He was placed on Megace appetite and weight improved.  He is followed actively by Cardiology, his amiodarone was discontinued.  His medications have been adjusted.  He reports feeling stronger.  Appetite is improved.  Weight has been stable.  We have placed his medications in a bubble pack and that is working well for him.  He is now cleared for cardiac rehab.  Echocardiogram 01/06/2020 shows EF 30%, RV severely hypokinetic.  He is anticoagulated with Eliquis.  His ASA was recently stopped.  No bleeding problems.  He comes in today with no specific complaints. He never saw a physician before he had his cardiac event in January.  He says he is trying very hard to get himself back to where he was before.  He has plans to get off some of the medication that he is currently taking.  He has questions about how spironolactone works for him. He reports his diet and appetite are good.  He has no trouble with diarrhea.  He is sleeping well without melatonin or trazodone.  He has had no chest pain, shortness of  breath, DOE, edema.  He has had no dizziness, lightheadedness or falls.  His memory is a little better now than it was a few months ago.  Patient presents today for complete physical and follow-up labs Immunizations: He is due for tetanus, pneumonia, he did have his Covid 504 9/21 and 04/05/2020.  We are holding vaccines for 3 months after the Covid vaccine.  We can catch these up in August. Diet: Reg Exercise:Beginning cardiac rehab- he is a walker Colonoscopy: NA Vision: Not UTD- need glasses Dentist: not needed dentures upper but never wore the lowers- no native teeth.  Social: . His DTR  [name] is with him and they continue to live together. Former smoker-quit 1996. Neg alcohol.   Debility: Improving. Doing well.  We will add vitamin D lab check.  No falls. Wt Readings from Last 3 Encounters:  05/10/20 113 lb (51.3 kg)  04/07/20 110 lb 3.2 oz (50 kg)  02/29/20 106 lb 12.8 oz (48.4 kg)   Anemia: Hgb 11.3, MCV 104.3. Needs  B12/folate and iron panel   HLD: He has no myalgias with Lipitor 80 mg daily. Needs lipid panel.LDL goal <70. Per Cardiology:   If not at goal will need addition of Zetia +/- [id number]   Past Medical History:  Diagnosis Date  . 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 Hepatitis B     Past Surgical History:  Procedure Laterality Date  . AORTIC VALVE REPLACEMENT N/A 12/31/2019   Procedure:  AORTIC VALVE REPLACEMENT (AVR);  Surgeon: Wonda Olds, MD;  Location: [place];  Service: Open Heart Surgery;  Laterality: N/A;  . CLIPPING OF ATRIAL APPENDAGE Left 12/31/2019   Procedure: Clipping Of Atrial Appendage;  Surgeon: Wonda Olds, MD;  Location: [place];  Service: Open Heart Surgery;  Laterality: Left;  . CORONARY ARTERY BYPASS GRAFT N/A 12/31/2019   Procedure: CORONARY ARTERY BYPASS GRAFTING (CABG) TIMES THREE USING LEFT INTERNAL MAMMARY ARTERY AND LEFT GREATER SAPHENOUS LEG VEIN HARVESTED ENDOSCOPICALLY;  Surgeon: Wonda Olds, MD;  Location: [place];  Service: Open Heart Surgery;  Laterality: N/A;  POSSIBLE BILATERAL IMA  . CORONARY/GRAFT ACUTE MI REVASCULARIZATION N/A 12/31/2019   Procedure: Coronary/Graft Acute MI Revascularization;  Surgeon: Yolonda Kida, MD;  Location: [place] [facility];  Service: Cardiovascular;  Laterality: N/A;  . IR THORACENTESIS ASP PLEURAL SPACE W/IMG GUIDE  01/14/2020  . LEFT HEART CATH AND CORONARY ANGIOGRAPHY N/A 12/31/2019   Procedure: LEFT HEART CATH AND CORONARY ANGIOGRAPHY;  Surgeon: Yolonda Kida, MD;  Location: [place] [facility];  Service: Cardiovascular;  Laterality: N/A;  . MAZE N/A 12/31/2019   Procedure: MAZE;  Surgeon: Wonda Olds, MD;  Location: [place];  Service: Open Heart Surgery;  Laterality: N/A;  . TEE WITHOUT CARDIOVERSION N/A 12/31/2019   Procedure: TRANSESOPHAGEAL ECHOCARDIOGRAM (TEE);  Surgeon: Wonda Olds, MD;  Location: [place];  Service: Open Heart Surgery;  Laterality: N/A;    Family History  Problem Relation Age of Onset  . Cancer Father     Social History   Socioeconomic History  . Marital status: Widowed    Spouse name: Not on file  . Number of children: Not on file  . Years of education: Not on file  . Highest education level: Not on file  Occupational History  . Not on file  Tobacco Use  . Smoking status: Former Smoker    Packs/day: 0.50    Years: 40.00    Pack years: 20.00    Quit date: 1996    Years since quitting: 25.4  . Smokeless tobacco: Never Used  Substance and Sexual Activity  . Alcohol use: Not Currently  . Drug use: Not Currently  . Sexual activity: Not Currently  Other Topics Concern  . Not on file  Social History Narrative  . Not on file   Social Determinants of Health   Financial Resource Strain:   . Difficulty of Paying Living Expenses:   Food Insecurity:   . Worried About Charity fundraiser in the Last Year:   . Arboriculturist in the Last Year:   Transportation Needs:   . Film/video editor (Medical):   Marland Kitchen  Lack of Transportation (Non-Medical):   Physical Activity:   . Days of Exercise per Week:   . Minutes of Exercise per Session:   Stress:   . Feeling of Stress :   Social Connections:   . Frequency of Communication with Friends and Family:   . Frequency of Social Gatherings with Friends and Family:   . Attends Religious Services:   . Active Member of Clubs or Organizations:   . Attends Archivist Meetings:   Marland Kitchen Marital Status:   Intimate Partner Violence:   . Fear of Current or Ex-Partner:   . Emotionally Abused:   Marland Kitchen Physically Abused:   . Sexually Abused:     Outpatient Medications Prior to Visit  Medication Sig Dispense Refill  . acetaminophen (TYLENOL) 325 MG  tablet Take 1-2 tablets (325-650 mg total) by mouth every 4 (four) hours as needed for mild pain.    Marland Kitchen apixaban (ELIQUIS) 2.5 MG TABS tablet Take 1 tablet (2.5 mg total) by mouth every 12 (twelve) hours. 180 tablet 3  . atorvastatin (LIPITOR) 80 MG tablet Take 1 tablet (80 mg total) by mouth daily at 6 PM. 90 tablet 3  . digoxin (LANOXIN) 0.125 MG tablet Take 0.5 tablets (0.0625 mg total) by mouth daily. 45 tablet 3  . furosemide (LASIX) 20 MG tablet Take 1 tablet (20 mg total) by mouth daily. 90 tablet 3  . losartan (COZAAR) 25 MG tablet Take 0.5 tablets (12.5 mg total) by mouth at bedtime. 45 tablet 3  . megestrol (MEGACE) 20 MG tablet Take 1 tablet (20 mg total) by mouth daily. 30 tablet 2  . potassium chloride (KLOR-CON) 10 MEQ tablet Take 1 tablet (10 mEq total) by mouth daily. 90 tablet 3  . spironolactone (ALDACTONE) 25 MG tablet Take 1 tablet (25 mg total) by mouth at bedtime. 90 tablet 3  . Melatonin 3 MG TABS Take 2 tablets (6 mg total) by mouth at bedtime. (Patient not taking: Reported on 05/10/2020)  0  . traZODone (DESYREL) 50 MG tablet Take 0.5-1 tablets (25-50 mg total) by mouth at bedtime as needed for sleep. (Patient not taking: Reported on 05/10/2020) 15 tablet 0   No facility-administered medications  prior to visit.    No Known Allergies  ROS Review of Systems  Constitutional: Negative for chills, fever and unexpected weight change.  HENT: Negative for congestion and sinus pressure.   Eyes: Negative.   Respiratory: Negative for cough, shortness of breath and wheezing.   Cardiovascular: Negative for chest pain, palpitations and leg swelling.  Gastrointestinal: Negative for abdominal pain and blood in stool.  Endocrine: Negative.   Genitourinary: Negative for difficulty urinating.  Musculoskeletal: Negative for arthralgias.  Skin: Negative for rash.  Neurological: Negative for dizziness, syncope, weakness and light-headedness.  Hematological: Bruises/bleeds easily.  Psychiatric/Behavioral:       No concerns with depression. PHQ-9: 0, GAD-7: 0      Objective:    Physical Exam  Constitutional: He is oriented to person, place, and time. He appears well-developed.  Small frame male and thin. Looks improved and stronger than in March.   HENT:  Head: Normocephalic and atraumatic.  Cardiovascular: Normal rate and regular rhythm.  Sternum incision scabbed in places  and closed.   Pulmonary/Chest: Effort normal and breath sounds normal.  Abdominal: Soft. There is no abdominal tenderness.  Musculoskeletal:        General: No edema. Normal range of motion.     Cervical back: Normal range of motion and neck supple.  Neurological: He is alert and oriented to person, place, and time.  Skin: Skin is warm and dry. No erythema.  Psychiatric: He has a normal mood and affect. His behavior is normal.  Vitals reviewed.   BP 130/68 (BP Location: Left Arm, Patient Position: Sitting, Cuff Size: Normal)   Pulse 70   Temp (!) 97.3 F (36.3 C) (Skin)   Ht '5\' 9"'  (1.753 m)   Wt 113 lb (51.3 kg)   SpO2 98%   BMI 16.69 kg/m  Wt Readings from Last 3 Encounters:  05/10/20 113 lb (51.3 kg)  04/07/20 110 lb 3.2 oz (50 kg)  02/29/20 106 lb 12.8 oz (48.4 kg)     Health Maintenance Due    Topic Date Due  . TETANUS/TDAP  Never  done  . PNA vac Low Risk Adult (1 of 2 - PCV13) Never done    There are no preventive care reminders to display for this patient.  Lab Results  Component Value Date   TSH 2.539 12/31/2019   Lab Results  Component Value Date   WBC 9.2 05/10/2020   HGB 10.9 (L) 05/10/2020   HCT 32.3 (L) 05/10/2020   MCV 107.3 (H) 05/10/2020   PLT 245.0 05/10/2020   Lab Results  Component Value Date   NA 139 05/10/2020   K 3.7 05/10/2020   CO2 29 05/10/2020   GLUCOSE 104 (H) 05/10/2020   BUN 22 05/10/2020   CREATININE 1.15 05/10/2020   BILITOT 1.1 05/10/2020   ALKPHOS 111 05/10/2020   AST 22 05/10/2020   ALT 20 05/10/2020   PROT 7.1 05/10/2020   ALBUMIN 3.9 05/10/2020   CALCIUM 9.2 05/10/2020   ANIONGAP 7 02/02/2020   GFR 60.99 05/10/2020   Lab Results  Component Value Date   CHOL 137 05/10/2020   Lab Results  Component Value Date   HDL 48.60 05/10/2020   Lab Results  Component Value Date   LDLCALC 76 05/10/2020   Lab Results  Component Value Date   TRIG 58.0 05/10/2020   Lab Results  Component Value Date   CHOLHDL 3 05/10/2020   Lab Results  Component Value Date   HGBA1C 5.2 12/31/2019      Assessment & Plan:   Problem List Items Addressed This Visit      Cardiovascular and Mediastinum   STEMI involving left main coronary artery ([place])   Relevant Orders   Lipid Profile (Completed)   Acute systolic congestive heart failure ([facility])   Relevant Orders   Lipid Profile (Completed)   Comp Met (CMET) (Completed)     Genitourinary   AKI (acute kidney injury) ([place])     Other   Debility   Anemia   Relevant Orders   CBC with Differential/Platelet (Completed)   Iron, TIBC and Ferritin Panel   Preventative health care - Primary   Chronic anticoagulation     Laboratory studies are ordered to follow-up as noted.  We discussed the use of spironolactone.  We reviewed some of his other medications in the function as well. He was  given information on the DASH diet.  Continue with Megace.  He is excited about starting cardiac rehab.  Catch-up immunizations in August.  Patient is not too excited about vaccines we will have a discussion about which ones are the most important.  He needs tetanus, pneumonia , Shingrix.  He has had the Covid vaccines the most recent 04/05/2020.  Further recommendations pending laboratory studies.  No orders of the defined types were placed in this encounter.  Addendum:   Lipid panel returns markedly improved from 4 months ago.  LDL dropped from 187 to 76.  Patient does not want to start another medication at this time.  He wants to give this another 3 months to see if he can get to goal below 70.  Cmet: Returns looking great with sodium 139, K3.7, chloride 104, BUN 22, creatinine 1.15, liver labs normal, albumin 3.9, ALT 20, GFR 60.99, calcium 9.2  CBC: Returns essentially unchanged with WBC 9.2,  hemoglobin 10.9, MCV is climbing 107.3, RDW is still 20.5 range, platelets 245.  I have added iron panel B12 and folate.  Follow-up: Return in about 3 months (around 08/10/2020).   This visit occurred during the SARS-CoV-2 public health emergency.  Safety protocols  were in place, including screening questions prior to the visit, additional usage of staff PPE, and extensive cleaning of exam room while observing appropriate contact time as indicated for disinfecting solutions.   Denice Paradise, NP

## 2020-05-10 NOTE — Patient Instructions (Addendum)
DASH Eating Plan DASH stands for "Dietary Approaches to Stop Hypertension." The DASH eating plan is a healthy eating plan that has been shown to reduce high blood pressure (hypertension). It may also reduce your risk for type 2 diabetes, heart disease, and stroke. The DASH eating plan may also help with weight loss. What are tips for following this plan?  General guidelines  Avoid eating more than 2,300 mg (milligrams) of salt (sodium) a day. If you have hypertension, you may need to reduce your sodium intake to 1,500 mg a day.  Limit alcohol intake to no more than 1 drink a day for nonpregnant women and 2 drinks a day for men. One drink equals 12 oz of beer, 5 oz of wine, or 1 oz of hard liquor.  Work with your health care provider to maintain a healthy body weight or to lose weight. Ask what an ideal weight is for you.  Get at least 30 minutes of exercise that causes your heart to beat faster (aerobic exercise) most days of the week. Activities may include walking, swimming, or biking.  Work with your health care provider or diet and nutrition specialist (dietitian) to adjust your eating plan to your individual calorie needs. Reading food labels   Check food labels for the amount of sodium per serving. Choose foods with less than 5 percent of the Daily Value of sodium. Generally, foods with less than 300 mg of sodium per serving fit into this eating plan.  To find whole grains, look for the word "whole" as the first word in the ingredient list. Shopping  Buy products labeled as "low-sodium" or "no salt added."  Buy fresh foods. Avoid canned foods and premade or frozen meals. Cooking  Avoid adding salt when cooking. Use salt-free seasonings or herbs instead of table salt or sea salt. Check with your health care provider or pharmacist before using salt substitutes.  Do not fry foods. Cook foods using healthy methods such as baking, boiling, grilling, and broiling instead.  Cook with  heart-healthy oils, such as olive, canola, soybean, or sunflower oil. Meal planning  Eat a balanced diet that includes: ? 5 or more servings of fruits and vegetables each day. At each meal, try to fill half of your plate with fruits and vegetables. ? Up to 6-8 servings of whole grains each day. ? Less than 6 oz of lean meat, poultry, or fish each day. A 3-oz serving of meat is about the same size as a deck of cards. One egg equals 1 oz. ? 2 servings of low-fat dairy each day. ? A serving of nuts, seeds, or beans 5 times each week. ? Heart-healthy fats. Healthy fats called Omega-3 fatty acids are found in foods such as flaxseeds and coldwater fish, like sardines, salmon, and mackerel.  Limit how much you eat of the following: ? Canned or prepackaged foods. ? Food that is high in trans fat, such as fried foods. ? Food that is high in saturated fat, such as fatty meat. ? Sweets, desserts, sugary drinks, and other foods with added sugar. ? Full-fat dairy products.  Do not salt foods before eating.  Try to eat at least 2 vegetarian meals each week.  Eat more home-cooked food and less restaurant, buffet, and fast food.  When eating at a restaurant, ask that your food be prepared with less salt or no salt, if possible. What foods are recommended? The items listed may not be a complete list. Talk with your dietitian about   what dietary choices are best for you. Grains Whole-grain or whole-wheat bread. Whole-grain or whole-wheat pasta. Brown rice. Oatmeal. Quinoa. Bulgur. Whole-grain and low-sodium cereals. Pita bread. Low-fat, low-sodium crackers. Whole-wheat flour tortillas. Vegetables Fresh or frozen vegetables (raw, steamed, roasted, or grilled). Low-sodium or reduced-sodium tomato and vegetable juice. Low-sodium or reduced-sodium tomato sauce and tomato paste. Low-sodium or reduced-sodium canned vegetables. Fruits All fresh, dried, or frozen fruit. Canned fruit in natural juice (without  added sugar). Meat and other protein foods Skinless chicken or turkey. Ground chicken or turkey. Pork with fat trimmed off. Fish and seafood. Egg whites. Dried beans, peas, or lentils. Unsalted nuts, nut butters, and seeds. Unsalted canned beans. Lean cuts of beef with fat trimmed off. Low-sodium, lean deli meat. Dairy Low-fat (1%) or fat-free (skim) milk. Fat-free, low-fat, or reduced-fat cheeses. Nonfat, low-sodium ricotta or cottage cheese. Low-fat or nonfat yogurt. Low-fat, low-sodium cheese. Fats and oils Soft margarine without trans fats. Vegetable oil. Low-fat, reduced-fat, or light mayonnaise and salad dressings (reduced-sodium). Canola, safflower, olive, soybean, and sunflower oils. Avocado. Seasoning and other foods Herbs. Spices. Seasoning mixes without salt. Unsalted popcorn and pretzels. Fat-free sweets. What foods are not recommended? The items listed may not be a complete list. Talk with your dietitian about what dietary choices are best for you. Grains Baked goods made with fat, such as croissants, muffins, or some breads. Dry pasta or rice meal packs. Vegetables Creamed or fried vegetables. Vegetables in a cheese sauce. Regular canned vegetables (not low-sodium or reduced-sodium). Regular canned tomato sauce and paste (not low-sodium or reduced-sodium). Regular tomato and vegetable juice (not low-sodium or reduced-sodium). Pickles. Olives. Fruits Canned fruit in a light or heavy syrup. Fried fruit. Fruit in cream or butter sauce. Meat and other protein foods Fatty cuts of meat. Ribs. Fried meat. Bacon. Sausage. Bologna and other processed lunch meats. Salami. Fatback. Hotdogs. Bratwurst. Salted nuts and seeds. Canned beans with added salt. Canned or smoked fish. Whole eggs or egg yolks. Chicken or turkey with skin. Dairy Whole or 2% milk, cream, and half-and-half. Whole or full-fat cream cheese. Whole-fat or sweetened yogurt. Full-fat cheese. Nondairy creamers. Whipped toppings.  Processed cheese and cheese spreads. Fats and oils Butter. Stick margarine. Lard. Shortening. Ghee. Bacon fat. Tropical oils, such as coconut, palm kernel, or palm oil. Seasoning and other foods Salted popcorn and pretzels. Onion salt, garlic salt, seasoned salt, table salt, and sea salt. Worcestershire sauce. Tartar sauce. Barbecue sauce. Teriyaki sauce. Soy sauce, including reduced-sodium. Steak sauce. Canned and packaged gravies. Fish sauce. Oyster sauce. Cocktail sauce. Horseradish that you find on the shelf. Ketchup. Mustard. Meat flavorings and tenderizers. Bouillon cubes. Hot sauce and Tabasco sauce. Premade or packaged marinades. Premade or packaged taco seasonings. Relishes. Regular salad dressings. Where to find more information:  National Heart, Lung, and Blood Institute: www.nhlbi.nih.gov  American Heart Association: www.heart.org Summary  The DASH eating plan is a healthy eating plan that has been shown to reduce high blood pressure (hypertension). It may also reduce your risk for type 2 diabetes, heart disease, and stroke.  With the DASH eating plan, you should limit salt (sodium) intake to 2,300 mg a day. If you have hypertension, you may need to reduce your sodium intake to 1,500 mg a day.  When on the DASH eating plan, aim to eat more fresh fruits and vegetables, whole grains, lean proteins, low-fat dairy, and heart-healthy fats.  Work with your health care provider or diet and nutrition specialist (dietitian) to adjust your eating plan to your   individual calorie needs. This information is not intended to replace advice given to you by your health care provider. Make sure you discuss any questions you have with your health care provider. Document Revised: 11/01/2017 Document Reviewed: 11/12/2016 Elsevier Patient Education  2020 Elsevier Inc. Preventive Care 65 Years and Older, Male Preventive care refers to lifestyle choices and visits with your health care provider that can  promote health and wellness. This includes:  A yearly physical exam. This is also called an annual well check.  Regular dental and eye exams.  Immunizations.  Screening for certain conditions.  Healthy lifestyle choices, such as diet and exercise. What can I expect for my preventive care visit? Physical exam Your health care provider will check:  Height and weight. These may be used to calculate body mass index (BMI), which is a measurement that tells if you are at a healthy weight.  Heart rate and blood pressure.  Your skin for abnormal spots. Counseling Your health care provider may ask you questions about:  Alcohol, tobacco, and drug use.  Emotional well-being.  Home and relationship well-being.  Sexual activity.  Eating habits.  History of falls.  Memory and ability to understand (cognition).  Work and work environment. What immunizations do I need?  Influenza (flu) vaccine  This is recommended every year. Tetanus, diphtheria, and pertussis (Tdap) vaccine  You may need a Td booster every 10 years. Varicella (chickenpox) vaccine  You may need this vaccine if you have not already been vaccinated. Zoster (shingles) vaccine  You may need this after age 60. Pneumococcal conjugate (PCV13) vaccine  One dose is recommended after age 65. Pneumococcal polysaccharide (PPSV23) vaccine  One dose is recommended after age 65. Measles, mumps, and rubella (MMR) vaccine  You may need at least one dose of MMR if you were born in 1957 or later. You may also need a second dose. Meningococcal conjugate (MenACWY) vaccine  You may need this if you have certain conditions. Hepatitis A vaccine  You may need this if you have certain conditions or if you travel or work in places where you may be exposed to hepatitis A. Hepatitis B vaccine  You may need this if you have certain conditions or if you travel or work in places where you may be exposed to hepatitis  B. Haemophilus influenzae type b (Hib) vaccine  You may need this if you have certain conditions. You may receive vaccines as individual doses or as more than one vaccine together in one shot (combination vaccines). Talk with your health care provider about the risks and benefits of combination vaccines. What tests do I need? Blood tests  Lipid and cholesterol levels. These may be checked every 5 years, or more frequently depending on your overall health.  Hepatitis C test.  Hepatitis B test. Screening  Lung cancer screening. You may have this screening every year starting at age 55 if you have a 30-pack-year history of smoking and currently smoke or have quit within the past 15 years.  Colorectal cancer screening. All adults should have this screening starting at age 50 and continuing until age 75. Your health care provider may recommend screening at age 45 if you are at increased risk. You will have tests every 1-10 years, depending on your results and the type of screening test.  Prostate cancer screening. Recommendations will vary depending on your family history and other risks.  Diabetes screening. This is done by checking your blood sugar (glucose) after you have not eaten   for a while (fasting). You may have this done every 1-3 years.  Abdominal aortic aneurysm (AAA) screening. You may need this if you are a current or former smoker.  Sexually transmitted disease (STD) testing. Follow these instructions at home: Eating and drinking  Eat a diet that includes fresh fruits and vegetables, whole grains, lean protein, and low-fat dairy products. Limit your intake of foods with high amounts of sugar, saturated fats, and salt.  Take vitamin and mineral supplements as recommended by your health care provider.  Do not drink alcohol if your health care provider tells you not to drink.  If you drink alcohol: ? Limit how much you have to 0-2 drinks a day. ? Be aware of how much  alcohol is in your drink. In the [place] one drink equals one 12 oz bottle of beer (355 mL), one 5 oz glass of wine (148 mL), or one 1 oz glass of hard liquor (44 mL). Lifestyle  Take daily care of your teeth and gums.  Stay active. Exercise for at least 30 minutes on 5 or more days each week.  Do not use any products that contain nicotine or tobacco, such as cigarettes, e-cigarettes, and chewing tobacco. If you need help quitting, ask your health care provider.  If you are sexually active, practice safe sex. Use a condom or other form of protection to prevent STIs (sexually transmitted infections).  Talk with your health care provider about taking a low-dose aspirin or statin. What's next?  Visit your health care provider once a year for a well check visit.  Ask your health care provider how often you should have your eyes and teeth checked.  Stay up to date on all vaccines. This information is not intended to replace advice given to you by your health care provider. Make sure you discuss any questions you have with your health care provider. Document Revised: 11/13/2018 Document Reviewed: 11/13/2018 Elsevier Patient Education  2020 Elsevier Inc.  

## 2020-05-11 ENCOUNTER — Encounter: Payer: Self-pay | Admitting: Nurse Practitioner

## 2020-05-11 DIAGNOSIS — Z7901 Long term (current) use of anticoagulants: Secondary | ICD-10-CM | POA: Insufficient documentation

## 2020-05-16 ENCOUNTER — Other Ambulatory Visit: Payer: Self-pay | Admitting: Cardiothoracic Surgery

## 2020-05-30 ENCOUNTER — Other Ambulatory Visit: Payer: Self-pay

## 2020-05-30 ENCOUNTER — Encounter (HOSPITAL_COMMUNITY): Payer: Medicare Other | Admitting: Cardiology

## 2020-05-30 ENCOUNTER — Other Ambulatory Visit (HOSPITAL_COMMUNITY): Payer: Medicare Other

## 2020-05-30 ENCOUNTER — Other Ambulatory Visit (INDEPENDENT_AMBULATORY_CARE_PROVIDER_SITE_OTHER): Payer: Medicare Other

## 2020-05-30 DIAGNOSIS — D7589 Other specified diseases of blood and blood-forming organs: Secondary | ICD-10-CM

## 2020-05-30 DIAGNOSIS — D649 Anemia, unspecified: Secondary | ICD-10-CM

## 2020-05-31 LAB — IRON,TIBC AND FERRITIN PANEL
%SAT: 40 % (calc) (ref 20–48)
Ferritin: 413 ng/mL — ABNORMAL HIGH (ref 24–380)
Iron: 103 ug/dL (ref 50–180)
TIBC: 257 mcg/dL (calc) (ref 250–425)

## 2020-06-01 ENCOUNTER — Telehealth: Payer: Self-pay | Admitting: Nurse Practitioner

## 2020-06-01 ENCOUNTER — Telehealth: Payer: Self-pay

## 2020-06-01 DIAGNOSIS — D7589 Other specified diseases of blood and blood-forming organs: Secondary | ICD-10-CM

## 2020-06-01 DIAGNOSIS — D649 Anemia, unspecified: Secondary | ICD-10-CM

## 2020-06-01 DIAGNOSIS — D539 Nutritional anemia, unspecified: Secondary | ICD-10-CM

## 2020-06-01 NOTE — Addendum Note (Signed)
Addended by: Tor Netters I on: 06/01/2020 04:46 PM   Modules accepted: Orders

## 2020-06-01 NOTE — Telephone Encounter (Signed)
I added his lab order in but I was unsure what dx to use. Can you add Dx to order?

## 2020-06-01 NOTE — Telephone Encounter (Signed)
I recommend a B12 /folate level for the CBC showing rising MCV- macrocytosis. If he has a low B12- we should know about it. Lab said there  is a problem with his insurance.   PLAN: Can you find out if it is needing a waiver? If, yes, would the patient agree to do it?

## 2020-06-01 NOTE — Telephone Encounter (Signed)
Please ask Mrs. David Perez to place orders and an add on sheet will be sent to the lab. Thank you!

## 2020-06-01 NOTE — Addendum Note (Signed)
Addended by: Sherrilee Gilles B on: 06/01/2020 04:09 PM   Modules accepted: Orders

## 2020-06-01 NOTE — Telephone Encounter (Signed)
David Perez wants to see if a B12/folate be added to patients labs? Dx: macrocytosis

## 2020-06-01 NOTE — Telephone Encounter (Signed)
It is in the previous message David Perez sent. [id number]

## 2020-06-02 ENCOUNTER — Other Ambulatory Visit: Payer: Self-pay

## 2020-06-02 ENCOUNTER — Other Ambulatory Visit (INDEPENDENT_AMBULATORY_CARE_PROVIDER_SITE_OTHER): Payer: Medicare Other

## 2020-06-02 DIAGNOSIS — D539 Nutritional anemia, unspecified: Secondary | ICD-10-CM | POA: Diagnosis not present

## 2020-06-02 DIAGNOSIS — D619 Aplastic anemia, unspecified: Secondary | ICD-10-CM

## 2020-06-02 NOTE — Telephone Encounter (Signed)
He will need to be redrawn again he is outside of the 24 hr window. So please have him reschedule

## 2020-06-02 NOTE — Addendum Note (Signed)
Addended by: Denice Paradise A on: 06/02/2020 01:21 PM   Modules accepted: Orders

## 2020-06-02 NOTE — Telephone Encounter (Signed)
Ask him to come for a re draw at his convenience to check B12/folate.

## 2020-06-02 NOTE — Addendum Note (Signed)
Addended by: Sherrilee Gilles B on: 06/02/2020 09:25 AM   Modules accepted: Orders

## 2020-06-02 NOTE — Telephone Encounter (Signed)
Yes patient needs to sign waiver before having lab done. Not sure if lab can still run this one or not or if he will need to come back in for another draw when he signs the waiver if he agrees. Can this still be done if he agrees Aleyah? I want to be sure before I call him and not have to call him multiple times today.

## 2020-06-02 NOTE — Telephone Encounter (Signed)
Patient is coming in today at 3:30 pm for lab draw

## 2020-06-02 NOTE — Telephone Encounter (Signed)
Lab will not allow me to sign and complete the order. I have printed the waiver for him to sign. He will have to come into the office to sign. Please sign lab so it can be ordered.

## 2020-06-03 LAB — B12 AND FOLATE PANEL
Folate: >24.8 ng/mL (ref >5.9)
Vitamin B-12: 1263 pg/mL — ABNORMAL HIGH (ref 211–911)

## 2020-06-07 NOTE — Addendum Note (Signed)
Addended by: Denice Paradise A on: 06/07/2020 11:15 AM   Modules accepted: Orders

## 2020-06-08 NOTE — Addendum Note (Signed)
Addended by: Denice Paradise A on: 06/08/2020 06:36 AM   Modules accepted: Orders

## 2020-06-10 ENCOUNTER — Other Ambulatory Visit: Payer: Self-pay

## 2020-06-10 ENCOUNTER — Encounter (HOSPITAL_COMMUNITY): Payer: Self-pay | Admitting: Cardiology

## 2020-06-10 ENCOUNTER — Ambulatory Visit (HOSPITAL_BASED_OUTPATIENT_CLINIC_OR_DEPARTMENT_OTHER)
Admission: RE | Admit: 2020-06-10 | Discharge: 2020-06-10 | Disposition: A | Discharge status: Home / Self Care | Payer: Medicare Other | Source: Ambulatory Visit | Attending: Cardiology | Admitting: Cardiology

## 2020-06-10 ENCOUNTER — Ambulatory Visit (HOSPITAL_COMMUNITY)
Admission: RE | Admit: 2020-06-10 | Discharge: 2020-06-10 | Disposition: A | Discharge status: Home / Self Care | Payer: Medicare Other | Source: Ambulatory Visit | Attending: Adult Health | Admitting: Adult Health

## 2020-06-10 VITALS — BP 146/82 | HR 86 | Wt 115.0 lb

## 2020-06-10 DIAGNOSIS — I509 Heart failure, unspecified: Secondary | ICD-10-CM | POA: Diagnosis not present

## 2020-06-10 DIAGNOSIS — Z951 Presence of aortocoronary bypass graft: Secondary | ICD-10-CM | POA: Insufficient documentation

## 2020-06-10 DIAGNOSIS — I5022 Chronic systolic (congestive) heart failure: Secondary | ICD-10-CM | POA: Diagnosis not present

## 2020-06-10 DIAGNOSIS — I2510 Atherosclerotic heart disease of native coronary artery without angina pectoris: Secondary | ICD-10-CM | POA: Insufficient documentation

## 2020-06-10 DIAGNOSIS — Z953 Presence of xenogenic heart valve: Secondary | ICD-10-CM | POA: Diagnosis not present

## 2020-06-10 DIAGNOSIS — Z7901 Long term (current) use of anticoagulants: Secondary | ICD-10-CM | POA: Diagnosis not present

## 2020-06-10 DIAGNOSIS — I350 Nonrheumatic aortic (valve) stenosis: Secondary | ICD-10-CM | POA: Diagnosis not present

## 2020-06-10 DIAGNOSIS — Z87891 Personal history of nicotine dependence: Secondary | ICD-10-CM | POA: Diagnosis not present

## 2020-06-10 DIAGNOSIS — E785 Hyperlipidemia, unspecified: Secondary | ICD-10-CM | POA: Insufficient documentation

## 2020-06-10 DIAGNOSIS — Z79899 Other long term (current) drug therapy: Secondary | ICD-10-CM | POA: Diagnosis not present

## 2020-06-10 DIAGNOSIS — B1910 Unspecified viral hepatitis B without hepatic coma: Secondary | ICD-10-CM | POA: Insufficient documentation

## 2020-06-10 DIAGNOSIS — I480 Paroxysmal atrial fibrillation: Secondary | ICD-10-CM | POA: Insufficient documentation

## 2020-06-10 DIAGNOSIS — I255 Ischemic cardiomyopathy: Secondary | ICD-10-CM | POA: Diagnosis not present

## 2020-06-10 DIAGNOSIS — I252 Old myocardial infarction: Secondary | ICD-10-CM | POA: Diagnosis not present

## 2020-06-10 LAB — BASIC METABOLIC PANEL
Anion gap: 8 (ref 5–15)
BUN: 20 mg/dL (ref 8–23)
CO2: 24 mmol/L (ref 22–32)
Calcium: 9.5 mg/dL (ref 8.9–10.3)
Chloride: 109 mmol/L (ref 98–111)
Creatinine, Ser: 1.04 mg/dL (ref 0.61–1.24)
GFR calc Af Amer: >60 mL/min (ref >60)
GFR calc non Af Amer: >60 mL/min (ref >60)
Glucose, Bld: 100 mg/dL — ABNORMAL HIGH (ref 70–99)
Potassium: 4.5 mmol/L (ref 3.5–5.1)
Sodium: 141 mmol/L (ref 135–145)

## 2020-06-10 MED ORDER — ENTRESTO 24-26 MG PO TABS
1.0000 | ORAL_TABLET | Freq: Two times a day (BID) | ORAL | 11 refills | Status: DC
Start: 2020-06-10 — End: 2020-09-06

## 2020-06-10 NOTE — Progress Notes (Signed)
°  Echocardiogram 2D Echocardiogram has been performed.  David Perez 06/10/2020, 10:48 AM

## 2020-06-10 NOTE — Patient Instructions (Signed)
STOP Lasix STOP Losartan STOP Digoxin  START Entresto 24/26 mg, one tb twice daily  Labs today We will only contact you if something comes back abnormal or we need to make some changes. Otherwise no news is good news!  Labs needed in 10-14 days  You have been referred to Cardiac Rehab at [facility]  -they will be in contact with you to arrange orientation  Your physician recommends that you schedule a follow-up appointment in: 3 weeks with the HF Pharmacy Team   Your physician recommends that you schedule a follow-up appointment in: 3 months with Dr Aundra Dubin   Do the following things EVERYDAY: 1) Weigh yourself in the morning before breakfast. Write it down and keep it in a log. 2) Take your medicines as prescribed 3) Eat low salt foods--Limit salt (sodium) to 2000 mg per day.  4) Stay as active as you can everyday 5) Limit all fluids for the day to less than 2 liters

## 2020-06-12 NOTE — Progress Notes (Signed)
Advanced Heart Failure Clinic Note   PCP: Marval Regal, NP PCP-Cardiologist: Dr. Aundra Dubin CT Surgeon: Dr. Orvan Seen  79 y.o. 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 He underwent CABG w/ LIMA-LAD, SVG-OM2, SVG-LPDA + bioprosthetic AVR and Maze procedure + LAA clip. Post-op required milrinone for RV support. He developed post-op left pleural effusion requiring thoracentesis. Once medically stable, he was transferred to [facility] for therapy and ultimately discharged home on 01/29/20   Echo was done today and reviewed, EF 45-50%, mid AS and apical septal severe hypokinesis, normal RV, normal bioprosthetic aortic valve mean gradient 13 mmHg.   Patient returns for followup of CHF and CAD. He has been doing well recently, rare dyspnea only with heavy exertion.  No chest pain.  No palpitations.  He lives alone but family is nearby.  Weight is up 5 lbs, he says that he is eating better.   No palpitations.  He is off Coreg due to bradycardia.   ECG (personally reviewed): NSR, LVH with repolarization  Labs (6/21): K 3.7, creatinine 1.15, LDL 76  PMH: 1. Hepatitis B 2. CAD: Acute STEMI with cardiogenic shock 1/21.  - Cath with severe LM stenosis => LIMA-LAD, SVG-left PDA, SVG-OM2.   3. Severe aortic stenosis: Bioprosthetic aortic valve at time of CABG in 1/21.  4. Atrial fibrillation: Paroxysmal.  Maze at time of CABG, also LA appendage clipping.  5. Chronic systolic CHF: Ischemic cardiomyopathy.  - Echo (2/21): EF 30%, hypokinetic RV.  - Echo (7/21): EF 45-50%, mid to apical anteroseptal severe hypokinesis, normal RV, normal bioprosthetic aortic valve with mean gradient 13 mmHg.   Current Outpatient Medications  Medication Sig Dispense Refill  . apixaban (ELIQUIS) 2.5 MG TABS tablet Take 1 tablet (2.5 mg total) by mouth every 12 (twelve)  hours. 180 tablet 3  . atorvastatin (LIPITOR) 80 MG tablet Take 1 tablet (80 mg total) by mouth daily at 6 PM. 90 tablet 3  . megestrol (MEGACE) 20 MG tablet TAKE 1 TABLET BY MOUTH ONCE DAILY 30 tablet 2  . potassium chloride (KLOR-CON) 10 MEQ tablet Take 1 tablet (10 mEq total) by mouth daily. 90 tablet 3  . spironolactone (ALDACTONE) 25 MG tablet Take 1 tablet (25 mg total) by mouth at bedtime. 90 tablet 3  . Melatonin 3 MG TABS Take 2 tablets (6 mg total) by mouth at bedtime. (Patient not taking: Reported on 05/10/2020)  0  . sacubitril-valsartan (ENTRESTO) 24-26 MG Take 1 tablet by mouth 2 (two) times daily. 60 tablet 11  . traZODone (DESYREL) 50 MG tablet Take 0.5-1 tablets (25-50 mg total) by mouth at bedtime as needed for sleep. (Patient not taking: Reported on 05/10/2020) 15 tablet 0   No current facility-administered medications for this encounter.    No Known Allergies    Social History   Socioeconomic History  . Marital status: Widowed    Spouse name: Not on file  . Number of children: Not on file  . Years of education: Not on file  . Highest education level: Not on file  Occupational History  . Not on file  Tobacco Use  . Smoking status: Former Smoker    Packs/day: 0.50    Years: 40.00    Pack years: 20.00    Quit date: 1996    Years since quitting: 25.5  . Smokeless tobacco: Never Used  Vaping  Use  . Vaping Use: Never used  Substance and Sexual Activity  . Alcohol use: Not Currently  . Drug use: Not Currently  . Sexual activity: Not Currently  Other Topics Concern  . Not on file  Social History Narrative  . Not on file   Social Determinants of Health   Financial Resource Strain:   . Difficulty of Paying Living Expenses:   Food Insecurity:   . Worried About Charity fundraiser in the Last Year:   . Arboriculturist in the Last Year:   Transportation Needs:   . Film/video editor (Medical):   Marland Kitchen Lack of Transportation (Non-Medical):   Physical Activity:    . Days of Exercise per Week:   . Minutes of Exercise per Session:   Stress:   . Feeling of Stress :   Social Connections:   . Frequency of Communication with Friends and Family:   . Frequency of Social Gatherings with Friends and Family:   . Attends Religious Services:   . Active Member of Clubs or Organizations:   . Attends Archivist Meetings:   Marland Kitchen Marital Status:   Intimate Partner Violence:   . Fear of Current or Ex-Partner:   . Emotionally Abused:   Marland Kitchen Physically Abused:   . Sexually Abused:       Family History  Problem Relation Age of Onset  . Cancer Father     Vitals:   06/10/20 1107 06/10/20 1134  BP: (!) 164/72 (!) 146/82  Pulse: 86   SpO2: 100%   Weight: 52.2 kg (115 lb)    Wt Readings from Last 3 Encounters:  06/10/20 52.2 kg (115 lb)  05/10/20 51.3 kg (113 lb)  04/07/20 50 kg (110 lb 3.2 oz)   PHYSICAL EXAM: General: NAD Neck: No JVD, no thyromegaly or thyroid nodule.  Lungs: Clear to auscultation bilaterally with normal respiratory effort. CV: Nondisplaced PMI.  Heart regular S1/S2, no S3/S4, no murmur.  No peripheral edema.  No carotid bruit.  Normal pedal pulses.  Abdomen: Soft, nontender, no hepatosplenomegaly, no distention.  Skin: Intact without lesions or rashes.  Neurologic: Alert and oriented x 3.  Psych: Normal affect. Extremities: No clubbing or cyanosis.  HEENT: Normal.   ASSESSMENT & PLAN:  1. CAD: S/p STEMI 12/2019 c/b cardiogenic shock requiring IABP; 99% LM stenosis noted. Now s/p CABG with LIMA-LAD, SVG-OM2, SVG-LPDA. No chest pain.  - No ASA given Eliquis use.  -Continue atorvastatin 80mg  daily, good lipids in 6/21.  - Refer to cardiac rehab.  2. Chronic systolic CHF: Ischemic cardiomyopathy.  Echo in 2/21 with EF 30%.  Now s/p CABG-AVR.  Echo was done today and reviewed, EF improved to 45-50%. NYHA class II symptoms.  He is not volume overloaded on exam.  - He can stop Lasix.  - Continue spironolactone 25 mg daily.    - He can stop digoxin.  - Stop losartan and start Entresto 24/26 bid. BMET today and 10 days.  - Off coreg due to bradycardia w/ HR in the 40s.   3. Atrial fibrillation: S/p Maze, LA appendage clip in 1/21. NSR today.  - Continue eliquis 2.5 mg twice a day. Lower dose with age and weight. No bleeding issues.   4. Hyperlipidemia: Good lipids in 6/21.  - Continue atorvastatin 80 qhs.   Followup in 3 wks with HF pharmacist to review BP and toleration of med changes.  Followup in 3 months with me.   Loralie Champagne 06/12/2020

## 2020-06-14 ENCOUNTER — Other Ambulatory Visit: Payer: Self-pay

## 2020-06-14 ENCOUNTER — Encounter: Payer: Medicare Other | Attending: Cardiology

## 2020-06-14 DIAGNOSIS — Z952 Presence of prosthetic heart valve: Secondary | ICD-10-CM | POA: Insufficient documentation

## 2020-06-14 DIAGNOSIS — Z951 Presence of aortocoronary bypass graft: Secondary | ICD-10-CM | POA: Insufficient documentation

## 2020-06-14 DIAGNOSIS — I252 Old myocardial infarction: Secondary | ICD-10-CM | POA: Insufficient documentation

## 2020-06-14 DIAGNOSIS — Z87891 Personal history of nicotine dependence: Secondary | ICD-10-CM | POA: Insufficient documentation

## 2020-06-14 NOTE — Progress Notes (Signed)
Virtual Visit completed. Patient informed on EP and RD appointment and 6 Minute walk test. Patient also informed of patient health questionnaires on My Chart. Patient Verbalizes understanding. Visit diagnosis can be found in [facility] 12/31/2019.

## 2020-06-16 ENCOUNTER — Encounter: Payer: Self-pay | Admitting: Oncology

## 2020-06-16 ENCOUNTER — Other Ambulatory Visit: Payer: Self-pay

## 2020-06-16 ENCOUNTER — Inpatient Hospital Stay: Payer: Medicare Other | Attending: Oncology | Admitting: Oncology

## 2020-06-16 ENCOUNTER — Inpatient Hospital Stay: Payer: Medicare Other

## 2020-06-16 VITALS — BP 117/75 | HR 96 | Temp 96.1°F | Resp 18 | Wt 113.6 lb

## 2020-06-16 DIAGNOSIS — Z7901 Long term (current) use of anticoagulants: Secondary | ICD-10-CM | POA: Diagnosis not present

## 2020-06-16 DIAGNOSIS — D72821 Monocytosis (symptomatic): Secondary | ICD-10-CM | POA: Insufficient documentation

## 2020-06-16 DIAGNOSIS — Z87891 Personal history of nicotine dependence: Secondary | ICD-10-CM | POA: Diagnosis not present

## 2020-06-16 DIAGNOSIS — D539 Nutritional anemia, unspecified: Secondary | ICD-10-CM | POA: Diagnosis not present

## 2020-06-16 DIAGNOSIS — Z791 Long term (current) use of non-steroidal anti-inflammatories (NSAID): Secondary | ICD-10-CM

## 2020-06-16 DIAGNOSIS — Z79899 Other long term (current) drug therapy: Secondary | ICD-10-CM | POA: Diagnosis not present

## 2020-06-16 DIAGNOSIS — I2510 Atherosclerotic heart disease of native coronary artery without angina pectoris: Secondary | ICD-10-CM | POA: Insufficient documentation

## 2020-06-16 LAB — COMPREHENSIVE METABOLIC PANEL
ALT: 27 U/L (ref 0–44)
AST: 32 U/L (ref 15–41)
Albumin: 4.1 g/dL (ref 3.5–5.0)
Alkaline Phosphatase: 79 U/L (ref 38–126)
Anion gap: 7 (ref 5–15)
BUN: 27 mg/dL — ABNORMAL HIGH (ref 8–23)
CO2: 24 mmol/L (ref 22–32)
Calcium: 9.6 mg/dL (ref 8.9–10.3)
Chloride: 109 mmol/L (ref 98–111)
Creatinine, Ser: 1.26 mg/dL — ABNORMAL HIGH (ref 0.61–1.24)
GFR calc Af Amer: >60 mL/min (ref >60)
GFR calc non Af Amer: 53 mL/min — ABNORMAL LOW (ref >60)
Glucose, Bld: 117 mg/dL — ABNORMAL HIGH (ref 70–99)
Potassium: 4.5 mmol/L (ref 3.5–5.1)
Sodium: 140 mmol/L (ref 135–145)
Total Bilirubin: 1.5 mg/dL — ABNORMAL HIGH (ref 0.3–1.2)
Total Protein: 8.4 g/dL — ABNORMAL HIGH (ref 6.5–8.1)

## 2020-06-16 LAB — CBC WITH DIFFERENTIAL/PLATELET
Abs Immature Granulocytes: 0.03 10*3/uL (ref 0.00–0.07)
Basophils Absolute: 0.1 10*3/uL (ref 0.0–0.1)
Basophils Relative: 1 %
Eosinophils Absolute: 0.1 10*3/uL (ref 0.0–0.5)
Eosinophils Relative: 1 %
HCT: 31.4 % — ABNORMAL LOW (ref 39.0–52.0)
Hemoglobin: 11.1 g/dL — ABNORMAL LOW (ref 13.0–17.0)
Immature Granulocytes: 0 %
Lymphocytes Relative: 20 %
Lymphs Abs: 2.1 10*3/uL (ref 0.7–4.0)
MCH: 36.8 pg — ABNORMAL HIGH (ref 26.0–34.0)
MCHC: 35.4 g/dL (ref 30.0–36.0)
MCV: 104 fL — ABNORMAL HIGH (ref 80.0–100.0)
Monocytes Absolute: 1 10*3/uL (ref 0.1–1.0)
Monocytes Relative: 9 %
Neutro Abs: 7.3 10*3/uL (ref 1.7–7.7)
Neutrophils Relative %: 69 %
Platelets: 284 10*3/uL (ref 150–400)
RBC: 3.02 MIL/uL — ABNORMAL LOW (ref 4.22–5.81)
RDW: 18.4 % — ABNORMAL HIGH (ref 11.5–15.5)
WBC: 10.6 10*3/uL — ABNORMAL HIGH (ref 4.0–10.5)
nRBC: 0 % (ref 0.0–0.2)

## 2020-06-16 LAB — TECHNOLOGIST SMEAR REVIEW: Plt Morphology: ADEQUATE

## 2020-06-16 LAB — RETIC PANEL
Immature Retic Fract: 17.1 % — ABNORMAL HIGH (ref 2.3–15.9)
RBC.: 3.08 MIL/uL — ABNORMAL LOW (ref 4.22–5.81)
Retic Count, Absolute: 40.7 10*3/uL (ref 19.0–186.0)
Retic Ct Pct: 1.3 % (ref 0.4–3.1)
Reticulocyte Hemoglobin: 37.7 pg (ref >27.9)

## 2020-06-16 LAB — LACTATE DEHYDROGENASE: LDH: 255 U/L — ABNORMAL HIGH (ref 98–192)

## 2020-06-16 NOTE — Progress Notes (Signed)
Hematology/Oncology Consult note [facility] [phone] [phone]   Patient Care Team: Marval Regal, NP as PCP - General (Nurse Practitioner)  REFERRING PROVIDER: Marval Regal, NP  CHIEF COMPLAINTS/REASON FOR VISIT:  Evaluation of anemia  HISTORY OF PRESENTING ILLNESS:   David Perez is a  79 y.o.  male with PMH listed below was seen in consultation at the request of  Marval Regal, NP  for evaluation of anemia Reviewed patient's previous blood work.  05/10/2020, hemoglobin was 10.9, MCV 107.3, platelet count 245,000, WBC 9.2, monocyte 4.1.  Reviewed patient's previous blood work.  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  No earlier blood work is available. Patient had folate and vitamin B12 level checked which were all normal.  Adequate iron stores. Patient was referred to hematology for further evaluation  Patient reports that he had surgery in January 2 721.  Recently his heart medication has been adjusted by cardiologist.  Chronic anticoagulation with Eliquis 2.5 mg twice daily, he has recently been started on entresto,    Review of Systems  Constitutional: Positive for fatigue. Negative for appetite change, chills, fever and unexpected weight change.  HENT:   Negative for hearing loss and voice change.   Eyes: Negative for eye problems and icterus.  Respiratory: Positive for shortness of breath. Negative for chest tightness and cough.   Cardiovascular: Negative for chest pain and leg swelling.  Gastrointestinal: Negative for abdominal distention and abdominal pain.  Endocrine: Negative for hot flashes.  Genitourinary: Negative for difficulty urinating, dysuria and frequency.   Musculoskeletal: Negative for arthralgias.  Skin: Negative for itching and rash.  Neurological: Negative for light-headedness and numbness.  Hematological: Negative for adenopathy. Does not bruise/bleed easily.    Psychiatric/Behavioral: Negative for confusion.    MEDICAL HISTORY:  Past Medical History:  Diagnosis Date  . 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 Hepatitis B     SURGICAL HISTORY: Past Surgical History:  Procedure Laterality Date  . 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;  Service: Open Heart Surgery;  Laterality: N/A;  . CLIPPING OF ATRIAL APPENDAGE Left 12/31/2019   Procedure: Clipping Of Atrial Appendage;  Surgeon: Wonda Olds, MD;  Location: [place];  Service: Open Heart Surgery;  Laterality: Left;  . CORONARY ARTERY BYPASS GRAFT N/A 12/31/2019   Procedure: CORONARY ARTERY BYPASS GRAFTING (CABG) TIMES THREE USING LEFT INTERNAL MAMMARY ARTERY AND LEFT GREATER SAPHENOUS LEG VEIN HARVESTED ENDOSCOPICALLY;  Surgeon: Wonda Olds, MD;  Location: [place];  Service: Open Heart Surgery;  Laterality: N/A;  POSSIBLE BILATERAL IMA  . CORONARY/GRAFT ACUTE MI REVASCULARIZATION N/A 12/31/2019   Procedure: Coronary/Graft Acute MI Revascularization;  Surgeon: Yolonda Kida, MD;  Location: [place] [facility];  Service: Cardiovascular;  Laterality: N/A;  . IR THORACENTESIS ASP PLEURAL SPACE W/IMG GUIDE  01/14/2020  . LEFT HEART CATH AND CORONARY ANGIOGRAPHY N/A 12/31/2019   Procedure: LEFT HEART CATH AND CORONARY ANGIOGRAPHY;  Surgeon: Yolonda Kida, MD;  Location: [place] [facility];  Service: Cardiovascular;  Laterality: N/A;  . MAZE N/A 12/31/2019   Procedure: MAZE;  Surgeon: Wonda Olds, MD;  Location: [place];  Service: Open Heart Surgery;  Laterality: N/A;  . TEE WITHOUT CARDIOVERSION N/A 12/31/2019   Procedure: TRANSESOPHAGEAL ECHOCARDIOGRAM (TEE);  Surgeon: Wonda Olds, MD;  Location: [facility] OR;  Service: Open Heart  Surgery;  Laterality: N/A;    SOCIAL HISTORY: Social History   Socioeconomic History  . Marital status: Widowed    Spouse name: Not on file   . Number of children: Not on file  . Years of education: Not on file  . Highest education level: Not on file  Occupational History  . Not on file  Tobacco Use  . Smoking status: Former Smoker    Packs/day: 0.50    Years: 40.00    Pack years: 20.00    Quit date: 1996    Years since quitting: 25.5  . Smokeless tobacco: Never Used  Vaping Use  . Vaping Use: Never used  Substance and Sexual Activity  . Alcohol use: Not Currently  . Drug use: Not Currently  . Sexual activity: Not Currently  Other Topics Concern  . Not on file  Social History Narrative  . Not on file   Social Determinants of Health   Financial Resource Strain:   . Difficulty of Paying Living Expenses:   Food Insecurity:   . Worried About Charity fundraiser in the Last Year:   . Arboriculturist in the Last Year:   Transportation Needs:   . Film/video editor (Medical):   Marland Kitchen Lack of Transportation (Non-Medical):   Physical Activity:   . Days of Exercise per Week:   . Minutes of Exercise per Session:   Stress:   . Feeling of Stress :   Social Connections:   . Frequency of Communication with Friends and Family:   . Frequency of Social Gatherings with Friends and Family:   . Attends Religious Services:   . Active Member of Clubs or Organizations:   . Attends Archivist Meetings:   Marland Kitchen Marital Status:   Intimate Partner Violence:   . Fear of Current or Ex-Partner:   . Emotionally Abused:   Marland Kitchen Physically Abused:   . Sexually Abused:     FAMILY HISTORY: Family History  Problem Relation Age of Onset  . Cancer Father     ALLERGIES:  has No Known Allergies.  MEDICATIONS:  Current Outpatient Medications  Medication Sig Dispense Refill  . apixaban (ELIQUIS) 2.5 MG TABS tablet Take 1 tablet (2.5 mg total) by mouth every 12 (twelve) hours. 180 tablet 3  . atorvastatin (LIPITOR) 80 MG tablet Take 1 tablet (80 mg total) by mouth daily at 6 PM. 90 tablet 3  . megestrol (MEGACE) 20 MG tablet TAKE  1 TABLET BY MOUTH ONCE DAILY 30 tablet 2  . potassium chloride (KLOR-CON) 10 MEQ tablet Take 1 tablet (10 mEq total) by mouth daily. 90 tablet 3  . sacubitril-valsartan (ENTRESTO) 24-26 MG Take 1 tablet by mouth 2 (two) times daily. 60 tablet 11  . spironolactone (ALDACTONE) 25 MG tablet Take 1 tablet (25 mg total) by mouth at bedtime. 90 tablet 3  . Melatonin 3 MG TABS Take 2 tablets (6 mg total) by mouth at bedtime. (Patient not taking: Reported on 05/10/2020)  0  . traZODone (DESYREL) 50 MG tablet Take 0.5-1 tablets (25-50 mg total) by mouth at bedtime as needed for sleep. (Patient not taking: Reported on 05/10/2020) 15 tablet 0   No current facility-administered medications for this visit.     PHYSICAL EXAMINATION: ECOG PERFORMANCE STATUS: 1 - Symptomatic but completely ambulatory Vitals:   06/16/20 0936  BP: 117/75  Pulse: 96  Resp: 18  Temp: (!) 96.1 F (35.6 C)   Filed Weights   06/16/20 0936  Weight:  113 lb 9.6 oz (51.5 kg)    Physical Exam Constitutional:      General: He is not in acute distress. HENT:     Head: Normocephalic and atraumatic.  Eyes:     General: No scleral icterus. Cardiovascular:     Rate and Rhythm: Normal rate and regular rhythm.     Heart sounds: Normal heart sounds.  Pulmonary:     Effort: Pulmonary effort is normal. No respiratory distress.     Breath sounds: No wheezing.  Abdominal:     General: Bowel sounds are normal. There is no distension.     Palpations: Abdomen is soft.  Musculoskeletal:        General: No deformity. Normal range of motion.     Cervical back: Normal range of motion and neck supple.  Skin:    General: Skin is warm and dry.     Findings: No erythema or rash.  Neurological:     Mental Status: He is alert and oriented to person, place, and time. Mental status is at baseline.     Cranial Nerves: No cranial nerve deficit.     Coordination: Coordination normal.  Psychiatric:        Mood and Affect: Mood normal.      LABORATORY DATA:  I have reviewed the data as listed Lab Results  Component Value Date   WBC 10.6 (H) 06/16/2020   HGB 11.1 (L) 06/16/2020   HCT 31.4 (L) 06/16/2020   MCV 104.0 (H) 06/16/2020   PLT 284 06/16/2020   Recent Labs    01/14/20 0550 02/02/20 0849 02/22/20 1042 02/22/20 1042 05/10/20 1101 06/10/20 1158 06/16/20 1005  NA   < > 137 137   < > 139 141 140  K   < > 4.0 3.7   < > 3.7 4.5 4.5  CL   < > 106 105   < > 104 109 109  CO2   < > 24 26   < > _0 GLUCOSE   < > 100* 112*   < > 104* 100* 117*  BUN   < > 26* 21   < > 22 20 27*  CREATININE   < > 1.48* 1.30   < > 1.15 1.04 1.26*  CALCIUM   < > 8.6* 9.3   < > 9.2 9.5 9.6  GFRNONAA  --  44*  --   --   --  >60 53*  GFRAA  --  51*  --   --   --  >60 >60  PROT   < >  --  7.2  --  7.1  --  8.4*  ALBUMIN   < >  --  3.5  --  3.9  --  4.1  AST   < >  --  27  --  22  --  32  ALT   < >  --  30  --  20  --  27  ALKPHOS   < >  --  92  --  111  --  79  BILITOT   < >  --  1.4*  --  1.1  --  1.5*  BILIDIR  --   --  0.3  --   --   --   --    < > = values in this interval not displayed.   Iron/TIBC/Ferritin/ %Sat    Component Value Date/Time   IRON 103 05/30/2020 1037   TIBC 257 05/30/2020 1037  FERRITIN 413 (H) 05/30/2020 1037   IRONPCTSAT 40 05/30/2020 1037      RADIOGRAPHIC STUDIES: I have personally reviewed the radiological images as listed and agreed with the findings in the report. ECHOCARDIOGRAM COMPLETE  Result Date: 06/10/2020    ECHOCARDIOGRAM REPORT   Patient Name:   DERIAN DIMALANTA Date of Exam: 06/10/2020 Medical Rec #:  [id number]          Height:       69.0 in Accession #:    [id number]         Weight:       113.0 lb Date of Birth:  1941-07-18          BSA:          1.621 m Patient Age:    30 years           BP:           139/66 mmHg Patient Gender: M                  HR:           87 bpm. Exam Location:  Inpatient Procedure: Cardiac Doppler and Color Doppler Indications:    Congestive Heart  Failure  History:        Patient has prior history of Echocardiogram examinations, most                 recent 01/06/2020. CHF, Previous Myocardial Infarction and CAD,                 Prior CABG; Risk Factors:Former Smoker. AVR.  Sonographer:    Clayton Lefort RDCS (AE) Referring Phys: [id number] AMY D CLEGG IMPRESSIONS  1. Left ventricular ejection fraction, by estimation, is 45 to 50%. The left ventricle has mildly decreased function. The left ventricle demonstrates regional wall motion abnormalities, mid anteroseptal/apical septal severe hypokinesis. There is mild left ventricular hypertrophy. Left ventricular diastolic parameters are consistent with Grade II diastolic dysfunction (pseudonormalization).  2. Right ventricular systolic function is normal. The right ventricular size is normal. There is mildly elevated pulmonary artery systolic pressure. The estimated right ventricular systolic pressure is 69.4 mmHg.  3. Left atrial size was mildly dilated.  4. Right atrial size was mildly dilated.  5. The mitral valve is degenerative. Mild mitral valve regurgitation. No evidence of mitral stenosis.  6. Tricuspid valve regurgitation is mild to moderate.  7. Bioprosthetic aortic valve functions normally. Aortic valve regurgitation is not visualized. No aortic stenosis is present. Aortic valve mean gradient measures 13.0 mmHg.  8. The inferior vena cava is normal in size with greater than 50% respiratory variability, suggesting right atrial pressure of 3 mmHg. FINDINGS  Left Ventricle: Left ventricular ejection fraction, by estimation, is 45 to 50%. The left ventricle has mildly decreased function. The left ventricle demonstrates regional wall motion abnormalities. The left ventricular internal cavity size was normal in size. There is mild left ventricular hypertrophy. Left ventricular diastolic parameters are consistent with Grade II diastolic dysfunction (pseudonormalization). Right Ventricle: The right ventricular size is  normal. No increase in right ventricular wall thickness. Right ventricular systolic function is normal. There is mildly elevated pulmonary artery systolic pressure. The tricuspid regurgitant velocity is 3.21  m/s, and with an assumed right atrial pressure of 3 mmHg, the estimated right ventricular systolic pressure is 85.4 mmHg. Left Atrium: Left atrial size was mildly dilated. Right Atrium: Right atrial size was mildly dilated. Pericardium: There is no evidence of pericardial effusion. Mitral Valve:  The mitral valve is degenerative in appearance. There is mild calcification of the mitral valve leaflet(s). Moderate mitral annular calcification. Mild mitral valve regurgitation. No evidence of mitral valve stenosis. MV peak gradient, 8.5 mmHg. The mean mitral valve gradient is 4.0 mmHg. Tricuspid Valve: The tricuspid valve is normal in structure. Tricuspid valve regurgitation is mild to moderate. Aortic Valve: Bioprosthetic aortic valve functions normally. The aortic valve has been repaired/replaced. Aortic valve regurgitation is not visualized. No aortic stenosis is present. Aortic valve mean gradient measures 13.0 mmHg. Aortic valve peak gradient measures 20.3 mmHg. Aortic valve area, by VTI measures 1.87 cm. Pulmonic Valve: The pulmonic valve was normal in structure. Pulmonic valve regurgitation is not visualized. Aorta: The aortic root is normal in size and structure. Venous: The inferior vena cava is normal in size with greater than 50% respiratory variability, suggesting right atrial pressure of 3 mmHg. IAS/Shunts: No atrial level shunt detected by color flow Doppler.  LEFT VENTRICLE PLAX 2D LVIDd:         3.90 cm      Diastology LVIDs:         2.90 cm      LV e' lateral:   8.92 cm/s LV PW:         1.20 cm      LV E/e' lateral: 16.4 LV IVS:        1.30 cm      LV e' medial:    6.96 cm/s LVOT diam:     1.90 cm      LV E/e' medial:  21.0 LV SV:         83 LV SV Index:   51 LVOT Area:     2.84 cm                               3D Volume EF: LV Volumes (MOD)            3D EF:        48 % LV vol d, MOD A2C: 115.0 ml LV EDV:       151 ml LV vol d, MOD A4C: 113.0 ml LV ESV:       78 ml LV vol s, MOD A2C: 48.3 ml  LV SV:        73 ml LV vol s, MOD A4C: 60.8 ml LV SV MOD A2C:     66.7 ml LV SV MOD A4C:     113.0 ml LV SV MOD BP:      62.5 ml RIGHT VENTRICLE            IVC RV Basal diam:  3.60 cm    IVC diam: 1.80 cm RV Mid diam:    2.70 cm RV S prime:     9.25 cm/s TAPSE (M-mode): 1.6 cm LEFT ATRIUM             Index       RIGHT ATRIUM           Index LA diam:        3.40 cm 2.10 cm/m  RA Area:     19.60 cm LA Vol (A2C):   68.4 ml 42.20 ml/m RA Volume:   61.30 ml  37.82 ml/m LA Vol (A4C):   28.0 ml 17.27 ml/m LA Biplane Vol: 43.6 ml 26.90 ml/m  AORTIC VALVE AV Area (Vmax):    1.87 cm AV Area (Vmean):   1.74 cm  AV Area (VTI):     1.87 cm AV Vmax:           225.33 cm/s AV Vmean:          157.000 cm/s AV VTI:            0.444 m AV Peak Grad:      20.3 mmHg AV Mean Grad:      13.0 mmHg LVOT Vmax:         149.00 cm/s LVOT Vmean:        96.200 cm/s LVOT VTI:          0.292 m LVOT/AV VTI ratio: 0.66  AORTA Ao Root diam: 3.60 cm MITRAL VALVE                TRICUSPID VALVE MV Area (PHT): 2.96 cm     TR Peak grad:   41.2 mmHg MV Peak grad:  8.5 mmHg     TR Vmax:        321.00 cm/s MV Mean grad:  4.0 mmHg MV Vmax:       1.46 m/s     SHUNTS MV Vmean:      89.1 cm/s    Systemic VTI:  0.29 m MV Decel Time: 256 msec     Systemic Diam: 1.90 cm MV E velocity: 146.00 cm/s MV A velocity: 129.00 cm/s MV E/A ratio:  1.13 Loralie Champagne MD Electronically signed by Loralie Champagne MD Signature Date/Time: 06/10/2020/11:44:27 AM    Final       ASSESSMENT & PLAN:  1. Macrocytic anemia   2. Monocytosis   3. Hyperbilirubinemia    Labs are reviewed and discussed with patient. Chronic anemia  check cbc cmp, LDH, smear, retic panel, multiple myeloma panel, flowcytometry and EBV panel.   Chronic leukocytosis, monocytosis, check above work up and  EBV panel.  Hyperbilirubinemia, check direct bilirubin level. Check DAT.  Orders Placed This Encounter  Procedures  . Multiple Myeloma Panel (SPEP&IFE w/QIG)    Standing Status:   Future    Number of Occurrences:   1    Standing Expiration Date:   06/16/2021  . Kappa/lambda light chains    Standing Status:   Future    Number of Occurrences:   1    Standing Expiration Date:   06/16/2021  . Retic Panel    Standing Status:   Future    Number of Occurrences:   1    Standing Expiration Date:   06/16/2021  . CBC with Differential/Platelet    Standing Status:   Future    Number of Occurrences:   1    Standing Expiration Date:   06/16/2021  . Comprehensive metabolic panel    Standing Status:   Future    Number of Occurrences:   1    Standing Expiration Date:   06/16/2021  . Technologist smear review    Standing Status:   Future    Number of Occurrences:   1    Standing Expiration Date:   06/16/2021  . Flow cytometry panel-leukemia/lymphoma work-up    Standing Status:   Future    Number of Occurrences:   1    Standing Expiration Date:   06/16/2021  . Lactate dehydrogenase    Standing Status:   Future    Number of Occurrences:   1    Standing Expiration Date:   06/16/2021  . Epstein barr vrs(ebv dna by pcr)    Standing Status:   Future  Number of Occurrences:   1    Standing Expiration Date:   06/16/2021    All questions were answered. The patient knows to call the clinic with any problems questions or concerns.  cc Marval Regal, NP    Return of visit: 2 weeks.  Thank you for this kind referral and the opportunity to participate in the care of this patient. A copy of today's note is routed to referring provider    Earlie Server, MD, PhD Hematology Oncology [facility] at [facility] Pager- [phone] 06/16/2020 c

## 2020-06-16 NOTE — Progress Notes (Signed)
New hematology evaluation. 

## 2020-06-17 ENCOUNTER — Telehealth: Payer: Self-pay

## 2020-06-17 LAB — KAPPA/LAMBDA LIGHT CHAINS
Kappa free light chain: 63 mg/L — ABNORMAL HIGH (ref 3.3–19.4)
Kappa, lambda light chain ratio: 2.36 — ABNORMAL HIGH (ref 0.26–1.65)
Lambda free light chains: 26.7 mg/L — ABNORMAL HIGH (ref 5.7–26.3)

## 2020-06-17 NOTE — Telephone Encounter (Signed)
Lab can't be added at this time and would need to be re-drawn.  Dr. Tasia Catchings aware and will draw next time.

## 2020-06-17 NOTE — Telephone Encounter (Signed)
-----   Message from Earlie Server, MD sent at 06/16/2020  4:10 PM EDT ----- Please add direct bilirubin. Thanks. Ordered.

## 2020-06-20 ENCOUNTER — Other Ambulatory Visit: Payer: Self-pay

## 2020-06-20 DIAGNOSIS — Z951 Presence of aortocoronary bypass graft: Secondary | ICD-10-CM | POA: Diagnosis not present

## 2020-06-20 DIAGNOSIS — I252 Old myocardial infarction: Secondary | ICD-10-CM | POA: Diagnosis present

## 2020-06-20 DIAGNOSIS — I213 ST elevation (STEMI) myocardial infarction of unspecified site: Secondary | ICD-10-CM

## 2020-06-20 DIAGNOSIS — Z87891 Personal history of nicotine dependence: Secondary | ICD-10-CM | POA: Diagnosis not present

## 2020-06-20 DIAGNOSIS — Z952 Presence of prosthetic heart valve: Secondary | ICD-10-CM | POA: Diagnosis not present

## 2020-06-20 LAB — MULTIPLE MYELOMA PANEL, SERUM
Albumin SerPl Elph-Mcnc: 3.7 g/dL (ref 2.9–4.4)
Albumin/Glob SerPl: 1 (ref 0.7–1.7)
Alpha 1: 0.3 g/dL (ref 0.0–0.4)
Alpha2 Glob SerPl Elph-Mcnc: 0.7 g/dL (ref 0.4–1.0)
B-Globulin SerPl Elph-Mcnc: 1.1 g/dL (ref 0.7–1.3)
Gamma Glob SerPl Elph-Mcnc: 1.9 g/dL — ABNORMAL HIGH (ref 0.4–1.8)
Globulin, Total: 3.9 g/dL (ref 2.2–3.9)
IgA: 274 mg/dL (ref 61–437)
IgG (Immunoglobin G), Serum: 1910 mg/dL — ABNORMAL HIGH (ref 603–1613)
IgM (Immunoglobulin M), Srm: 49 mg/dL (ref 15–143)
Total Protein ELP: 7.6 g/dL (ref 6.0–8.5)

## 2020-06-20 LAB — COMP PANEL: LEUKEMIA/LYMPHOMA

## 2020-06-20 NOTE — Progress Notes (Signed)
Cardiac Individual Treatment Plan  Patient Details  Name: Xayvion Shirah MRN: [id number] Date of Birth: 11/09/1941 Referring Provider:     Cardiac Rehab from 06/20/2020 in [facility] Cardiac and Pulmonary Rehab  Referring Provider Aundra Dubin      Initial Encounter Date:    Cardiac Rehab from 06/20/2020 in [facility] Cardiac and Pulmonary Rehab  Date 06/20/20      Visit Diagnosis: S/P CABG x 3  ST elevation myocardial infarction (STEMI), unspecified artery (HCC)  S/P TAVR (transcatheter aortic valve replacement)  Patient's Home Medications on Admission:  Current Outpatient Medications:  .  apixaban (ELIQUIS) 2.5 MG TABS tablet, Take 1 tablet (2.5 mg total) by mouth every 12 (twelve) hours., Disp: 180 tablet, Rfl: 3 .  atorvastatin (LIPITOR) 80 MG tablet, Take 1 tablet (80 mg total) by mouth daily at 6 PM., Disp: 90 tablet, Rfl: 3 .  megestrol (MEGACE) 20 MG tablet, TAKE 1 TABLET BY MOUTH ONCE DAILY, Disp: 30 tablet, Rfl: 2 .  Melatonin 3 MG TABS, Take 2 tablets (6 mg total) by mouth at bedtime. (Patient not taking: Reported on 05/10/2020), Disp: , Rfl: 0 .  potassium chloride (KLOR-CON) 10 MEQ tablet, Take 1 tablet (10 mEq total) by mouth daily., Disp: 90 tablet, Rfl: 3 .  sacubitril-valsartan (ENTRESTO) 24-26 MG, Take 1 tablet by mouth 2 (two) times daily., Disp: 60 tablet, Rfl: 11 .  spironolactone (ALDACTONE) 25 MG tablet, Take 1 tablet (25 mg total) by mouth at bedtime., Disp: 90 tablet, Rfl: 3 .  traZODone (DESYREL) 50 MG tablet, Take 0.5-1 tablets (25-50 mg total) by mouth at bedtime as needed for sleep. (Patient not taking: Reported on 05/10/2020), Disp: 15 tablet, Rfl: 0  Past Medical History: Past Medical History:  Diagnosis Date  . 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 Hepatitis B     Tobacco Use: Social History   Tobacco Use  Smoking Status Former Smoker  . Packs/day: 0.50  . Years: 40.00  . Pack years: 20.00  . Quit date: 75  . Years since quitting: 25.5   Smokeless Tobacco Never Used    Labs: Recent Chemical engineer    Labs for ITP Cardiac and Pulmonary Rehab Latest Ref Rng & Units 01/05/2020 01/06/2020 01/06/2020 01/07/2020 05/10/2020   Cholestrol 0 - 200 mg/dL - - - - 137   LDLCALC 0 - 99 mg/dL - - - - 76   HDL >39.00 mg/dL - - - - 48.60   Trlycerides 0 - 149 mg/dL - - - - 58.0   Hemoglobin A1c 4.8 - 5.6 % - - - - -   PHART 7.35 - 7.45 - - - - -   PCO2ART 32 - 48 mmHg - - - - -   HCO3 20.0 - 28.0 mmol/L - - - - -   TCO2 22 - 32 mmol/L - - - - -   ACIDBASEDEF 0.0 - 2.0 mmol/L - - - - -   O2SAT % 58.0 52.7 58.4 57.3 -       Exercise Target Goals: Exercise Program Goal: Individual exercise prescription set using results from initial 6 min walk test and THRR while considering  patient's activity barriers and safety.   Exercise Prescription Goal: Initial exercise prescription builds to 30-45 minutes a day of aerobic activity, 2-3 days per week.  Home exercise guidelines will be given to patient during program as part of exercise prescription that the participant will acknowledge.   Education: Aerobic Exercise & Resistance Training: -  Gives group verbal and written instruction on the various components of exercise. Focuses on aerobic and resistive training programs and the benefits of this training and how to safely progress through these programs..   Education: Exercise & Equipment Safety: - Individual verbal instruction and demonstration of equipment use and safety with use of the equipment.   Cardiac Rehab from 06/20/2020 in [facility] Cardiac and Pulmonary Rehab  Date 06/20/20  Educator AS  Instruction Review Code 1- Verbalizes Understanding      Education: Exercise Physiology & General Exercise Guidelines: - Group verbal and written instruction with models to review the exercise physiology of the cardiovascular system and associated critical values. Provides general exercise guidelines with specific guidelines to those with heart or  lung disease.    Education: Flexibility, Balance, Mind/Body Relaxation: Provides group verbal/written instruction on the benefits of flexibility and balance training, including mind/body exercise modes such as yoga, pilates and tai chi.  Demonstration and skill practice provided.   Activity Barriers & Risk Stratification:   6 Minute Walk:  6 Minute Walk    Row Name 06/20/20 1039         6 Minute Walk   Phase Initial     Distance 700 feet     Walk Time 6 minutes     # of Rest Breaks 0     MPH 1.32     METS 1.6     RPE 11     Perceived Dyspnea  1     VO2 Peak 5.7     Symptoms No     Resting HR 92 bpm     Resting BP 98/56     Resting Oxygen Saturation  100 %     Exercise Oxygen Saturation  during 6 min walk 98 %     Max Ex. HR 109 bpm     Max Ex. BP 104/58     2 Minute Post BP 102/56            Oxygen Initial Assessment:   Oxygen Re-Evaluation:   Oxygen Discharge (Final Oxygen Re-Evaluation):   Initial Exercise Prescription:  Initial Exercise Prescription - 06/20/20 1000      Date of Initial Exercise RX and Referring Provider   Date 06/20/20    Referring Provider Aundra Dubin      Treadmill   MPH 1    Grade 0    Minutes 15    METs 1.77      Recumbant Bike   Level 1    RPM 60    Minutes 15    METs 1.6      NuStep   Level 1    SPM 80    Minutes 15    METs 1.6      REL-XR   Level 1    Speed 50    Minutes 15    METs 1.6      Prescription Details   Frequency (times per week) 3    Duration Progress to 30 minutes of continuous aerobic without signs/symptoms of physical distress      Intensity   THRR 40-80% of Max Heartrate 110-130    Ratings of Perceived Exertion 11-13    Perceived Dyspnea 0-4           Perform Capillary Blood Glucose checks as needed.  Exercise Prescription Changes:  Exercise Prescription Changes    Row Name 06/20/20 1000             Response to Exercise  Blood Pressure (Admit) 98/56       Blood Pressure  (Exercise) 104/58       Blood Pressure (Exit) 102/56       Heart Rate (Admit) 92 bpm       Heart Rate (Exercise) 109 bpm       Heart Rate (Exit) 99 bpm       Oxygen Saturation (Admit) 100 %       Oxygen Saturation (Exercise) 98 %       Rating of Perceived Exertion (Exercise) 11       Perceived Dyspnea (Exercise) 1       Symptoms none              Exercise Comments:   Exercise Goals and Review:  Exercise Goals    Row Name 06/20/20 1047             Exercise Goals   Increase Physical Activity Yes       Intervention Provide advice, education, support and counseling about physical activity/exercise needs.;Develop an individualized exercise prescription for aerobic and resistive training based on initial evaluation findings, risk stratification, comorbidities and participant's personal goals.       Expected Outcomes Short Term: Attend rehab on a regular basis to increase amount of physical activity.;Long Term: Add in home exercise to make exercise part of routine and to increase amount of physical activity.;Long Term: Exercising regularly at least 3-5 days a week.       Increase Strength and Stamina Yes       Intervention Provide advice, education, support and counseling about physical activity/exercise needs.;Develop an individualized exercise prescription for aerobic and resistive training based on initial evaluation findings, risk stratification, comorbidities and participant's personal goals.       Expected Outcomes Short Term: Increase workloads from initial exercise prescription for resistance, speed, and METs.;Short Term: Perform resistance training exercises routinely during rehab and add in resistance training at home;Long Term: Improve cardiorespiratory fitness, muscular endurance and strength as measured by increased METs and functional capacity (6MWT)       Able to understand and use rate of perceived exertion (RPE) scale Yes       Intervention Provide education and  explanation on how to use RPE scale       Expected Outcomes Short Term: Able to use RPE daily in rehab to express subjective intensity level;Long Term:  Able to use RPE to guide intensity level when exercising independently       Knowledge and understanding of Target Heart Rate Range (THRR) Yes       Intervention Provide education and explanation of THRR including how the numbers were predicted and where they are located for reference       Expected Outcomes Short Term: Able to state/look up THRR;Short Term: Able to use daily as guideline for intensity in rehab;Long Term: Able to use THRR to govern intensity when exercising independently       Able to check pulse independently Yes       Intervention Provide education and demonstration on how to check pulse in carotid and radial arteries.;Review the importance of being able to check your own pulse for safety during independent exercise       Expected Outcomes Short Term: Able to explain why pulse checking is important during independent exercise;Long Term: Able to check pulse independently and accurately       Understanding of Exercise Prescription Yes       Intervention Provide education, explanation, and written  materials on patient's individual exercise prescription       Expected Outcomes Short Term: Able to explain program exercise prescription;Long Term: Able to explain home exercise prescription to exercise independently              Exercise Goals Re-Evaluation :   Discharge Exercise Prescription (Final Exercise Prescription Changes):  Exercise Prescription Changes - 06/20/20 1000      Response to Exercise   Blood Pressure (Admit) 98/56    Blood Pressure (Exercise) 104/58    Blood Pressure (Exit) 102/56    Heart Rate (Admit) 92 bpm    Heart Rate (Exercise) 109 bpm    Heart Rate (Exit) 99 bpm    Oxygen Saturation (Admit) 100 %    Oxygen Saturation (Exercise) 98 %    Rating of Perceived Exertion (Exercise) 11    Perceived Dyspnea  (Exercise) 1    Symptoms none           Nutrition:  Target Goals: Understanding of nutrition guidelines, daily intake of sodium '1500mg'$ , cholesterol '200mg'$ , calories 30% from fat and 7% or less from saturated fats, daily to have 5 or more servings of fruits and vegetables.  Education: Controlling Sodium/Reading Food Labels -Group verbal and written material supporting the discussion of sodium use in heart healthy nutrition. Review and explanation with models, verbal and written materials for utilization of the food label.   Education: General Nutrition Guidelines/Fats and Fiber: -Group instruction provided by verbal, written material, models and posters to present the general guidelines for heart healthy nutrition. Gives an explanation and review of dietary fats and fiber.   Biometrics:    Nutrition Therapy Plan and Nutrition Goals:   Nutrition Assessments:  Nutrition Assessments - 06/20/20 1058      MEDFICTS Scores   Pre Score 54           MEDIFICTS Score Key:          ?70 Need to make dietary changes          40-70 Heart Healthy Diet         ? 40 Therapeutic Level Cholesterol Diet  Nutrition Goals Re-Evaluation:   Nutrition Goals Discharge (Final Nutrition Goals Re-Evaluation):   Psychosocial: Target Goals: Acknowledge presence or absence of significant depression and/or stress, maximize coping skills, provide positive support system. Participant is able to verbalize types and ability to use techniques and skills needed for reducing stress and depression.   Education: Depression - Provides group verbal and written instruction on the correlation between heart/lung disease and depressed mood, treatment options, and the stigmas associated with seeking treatment.   Education: Sleep Hygiene -Provides group verbal and written instruction about how sleep can affect your health.  Define sleep hygiene, discuss sleep cycles and impact of sleep habits. Review good sleep  hygiene tips.     Education: Stress and Anxiety: - Provides group verbal and written instruction about the health risks of elevated stress and causes of high stress.  Discuss the correlation between heart/lung disease and anxiety and treatment options. Review healthy ways to manage with stress and anxiety.    Initial Review & Psychosocial Screening:  Initial Psych Review & Screening - 06/14/20 0942      Initial Review   Current issues with None Identified    Source of Stress Concerns None Identified      Family Dynamics   Good Support System? Yes    Comments He look to his daughter for support.      Barriers  Psychosocial barriers to participate in program The patient should benefit from training in stress management and relaxation.;There are no identifiable barriers or psychosocial needs.      Screening Interventions   Interventions Provide feedback about the scores to participant;Encouraged to exercise;To provide support and resources with identified psychosocial needs    Expected Outcomes Short Term goal: Utilizing psychosocial counselor, staff and physician to assist with identification of specific Stressors or current issues interfering with healing process. Setting desired goal for each stressor or current issue identified.;Long Term Goal: Stressors or current issues are controlled or eliminated.;Short Term goal: Identification and review with participant of any Quality of Life or Depression concerns found by scoring the questionnaire.;Long Term goal: The participant improves quality of Life and PHQ9 Scores as seen by post scores and/or verbalization of changes           Quality of Life Scores:   Quality of Life - 06/20/20 1056      Quality of Life   Select Quality of Life      Quality of Life Scores   Health/Function Pre 23.5 %    Socioeconomic Pre 27.5 %    Psych/Spiritual Pre 26.79 %    Family Pre 29.38 %    GLOBAL Pre 25.81 %          Scores of 19 and below  usually indicate a poorer quality of life in these areas.  A difference of  2-3 points is a clinically meaningful difference.  A difference of 2-3 points in the total score of the Quality of Life Index has been associated with significant improvement in overall quality of life, self-image, physical symptoms, and general health in studies assessing change in quality of life.  PHQ-9: Recent Review Flowsheet Data    Depression screen [facility] 2/9 06/20/2020 05/10/2020   Decreased Interest 0 0   Down, Depressed, Hopeless 0 0   PHQ - 2 Score 0 0   Altered sleeping 0 0   Tired, decreased energy 0 0   Change in appetite 0 0   Feeling bad or failure about yourself  0 0   Trouble concentrating 0 0   Moving slowly or fidgety/restless 0 0   Suicidal thoughts 0 0   PHQ-9 Score 0 0   Difficult doing work/chores - Not difficult at all     Interpretation of Total Score  Total Score Depression Severity:  1-4 = Minimal depression, 5-9 = Mild depression, 10-14 = Moderate depression, 15-19 = Moderately severe depression, 20-27 = Severe depression   Psychosocial Evaluation and Intervention:  Psychosocial Evaluation - 06/14/20 0946      Psychosocial Evaluation & Interventions   Interventions Encouraged to exercise with the program and follow exercise prescription    Comments Patiet states that he has no mental issues at this time.    Expected Outcomes Short: Exercise regularly to support mental health and notify staff of any changes. Long: maintain mental health and well being through teaching of rehab or prescribed medications independently.    Continue Psychosocial Services  Follow up required by staff           Psychosocial Re-Evaluation:   Psychosocial Discharge (Final Psychosocial Re-Evaluation):   Vocational Rehabilitation: Provide vocational rehab assistance to qualifying candidates.   Vocational Rehab Evaluation & Intervention:   Education: Education Goals: Education classes will be  provided on a variety of topics geared toward better understanding of heart health and risk factor modification. Participant will state understanding/return demonstration of topics presented  as noted by education test scores.  Learning Barriers/Preferences:  Learning Barriers/Preferences - 06/14/20 0936      Learning Barriers/Preferences   Learning Barriers None    Learning Preferences None           General Cardiac Education Topics:  AED/CPR: - Group verbal and written instruction with the use of models to demonstrate the basic use of the AED with the basic ABC's of resuscitation.   Anatomy & Physiology of the Heart: - Group verbal and written instruction and models provide basic cardiac anatomy and physiology, with the coronary electrical and arterial systems. Review of Valvular disease and Heart Failure   Cardiac Procedures: - Group verbal and written instruction to review commonly prescribed medications for heart disease. Reviews the medication, class of the drug, and side effects. Includes the steps to properly store meds and maintain the prescription regimen. (beta blockers and nitrates)   Cardiac Medications I: - Group verbal and written instruction to review commonly prescribed medications for heart disease. Reviews the medication, class of the drug, and side effects. Includes the steps to properly store meds and maintain the prescription regimen.   Cardiac Medications II: -Group verbal and written instruction to review commonly prescribed medications for heart disease. Reviews the medication, class of the drug, and side effects. (all other drug classes)    Go Sex-Intimacy & Heart Disease, Get SMART - Goal Setting: - Group verbal and written instruction through game format to discuss heart disease and the return to sexual intimacy. Provides group verbal and written material to discuss and apply goal setting through the application of the S.M.A.R.T. Method.   Other  Matters of the Heart: - Provides group verbal, written materials and models to describe Stable Angina and Peripheral Artery. Includes description of the disease process and treatment options available to the cardiac patient.   Infection Prevention: - Provides verbal and written material to individual with discussion of infection control including proper hand washing and proper equipment cleaning during exercise session.   Cardiac Rehab from 06/20/2020 in [facility] Cardiac and Pulmonary Rehab  Date 06/20/20  Educator AS  Instruction Review Code 1- Verbalizes Understanding      Falls Prevention: - Provides verbal and written material to individual with discussion of falls prevention and safety.   Cardiac Rehab from 06/20/2020 in [facility] Cardiac and Pulmonary Rehab  Date 06/20/20  Educator AS  Instruction Review Code 1- Verbalizes Understanding      Other: -Provides group and verbal instruction on various topics (see comments)   Knowledge Questionnaire Score:   Core Components/Risk Factors/Patient Goals at Admission:  Personal Goals and Risk Factors at Admission - 06/20/20 1055      Core Components/Risk Factors/Patient Goals on Admission    Weight Management Yes;Weight Gain    Intervention Weight Management: Develop a combined nutrition and exercise program designed to reach desired caloric intake, while maintaining appropriate intake of nutrient and fiber, sodium and fats, and appropriate energy expenditure required for the weight goal.;Weight Management: Provide education and appropriate resources to help participant work on and attain dietary goals.;Weight Management/Obesity: Establish reasonable short term and long term weight goals.    Admit Weight 113 lb 6.4 oz (51.4 kg)    Goal Weight: Short Term 120 lb (54.4 kg)    Goal Weight: Long Term 125 lb (56.7 kg)    Expected Outcomes Short Term: Continue to assess and modify interventions until short term weight is achieved;Long Term:  Adherence to nutrition and physical activity/exercise program aimed toward  attainment of established weight goal;Weight Maintenance: Understanding of the daily nutrition guidelines, which includes 25-35% calories from fat, 7% or less cal from saturated fats, less than 250m cholesterol, less than 1.5gm of sodium, & 5 or more servings of fruits and vegetables daily;Understanding recommendations for meals to include 15-35% energy as protein, 25-35% energy from fat, 35-60% energy from carbohydrates, less than 2012mof dietary cholesterol, 20-35 gm of total fiber daily;Understanding of distribution of calorie intake throughout the day with the consumption of 4-5 meals/snacks;Weight Gain: Understanding of general recommendations for a high calorie, high protein meal plan that promotes weight gain by distributing calorie intake throughout the day with the consumption for 4-5 meals, snacks, and/or supplements    Intervention Provide a combined exercise and nutrition program that is supplemented with education, support and counseling about heart failure. Directed toward relieving symptoms such as shortness of breath, decreased exercise tolerance, and extremity edema.    Expected Outcomes Improve functional capacity of life;Short term: Daily weights obtained and reported for increase. Utilizing diuretic protocols set by physician.;Short term: Attendance in program 2-3 days a week with increased exercise capacity. Reported lower sodium intake. Reported increased fruit and vegetable intake. Reports medication compliance.;Long term: Adoption of self-care skills and reduction of barriers for early signs and symptoms recognition and intervention leading to self-care maintenance.    Intervention Provide education and support for participant on nutrition & aerobic/resistive exercise along with prescribed medications to achieve LDL <7079mHDL >37m56m  Expected Outcomes Short Term: Participant states understanding of desired  cholesterol values and is compliant with medications prescribed. Participant is following exercise prescription and nutrition guidelines.;Long Term: Cholesterol controlled with medications as prescribed, with individualized exercise RX and with personalized nutrition plan. Value goals: LDL < 70mg72mL > 40 mg.           Education:Diabetes - Individual verbal and written instruction to review signs/symptoms of diabetes, desired ranges of glucose level fasting, after meals and with exercise. Acknowledge that pre and post exercise glucose checks will be done for 3 sessions at entry of program.   Education: Know Your Numbers and Risk Factors: -Group verbal and written instruction about important numbers in your health.  Discussion of what are risk factors and how they play a role in the disease process.  Review of Cholesterol, Blood Pressure, Diabetes, and BMI and the role they play in your overall health.   Core Components/Risk Factors/Patient Goals Review:    Core Components/Risk Factors/Patient Goals at Discharge (Final Review):    ITP Comments:  ITP Comments    Row Name 06/14/20 0949           ITP Comments Virtual Visit completed. Patient informed on EP and RD appointment and 6 Minute walk test. Patient also informed of patient health questionnaires on My Chart. Patient Verbalizes understanding. Visit diagnosis can be found in [facility]/2021.              Comments: initial ITP

## 2020-06-20 NOTE — Patient Instructions (Signed)
Patient Instructions  Patient Details  Name: David Perez MRN: [id number] Date of Birth: [birth year] Referring Provider:  Larey Dresser, MD  Below are your personal goals for exercise, nutrition, and risk factors. Our goal is to help you stay on track towards obtaining and maintaining these goals. We will be discussing your progress on these goals with you throughout the program.  Initial Exercise Prescription:  Initial Exercise Prescription - 06/20/20 1000      Date of Initial Exercise RX and Referring Provider   Date 06/20/20    Referring Provider Aundra Dubin      Treadmill   MPH 1    Grade 0    Minutes 15    METs 1.77      Recumbant Bike   Level 1    RPM 60    Minutes 15    METs 1.6      NuStep   Level 1    SPM 80    Minutes 15    METs 1.6      REL-XR   Level 1    Speed 50    Minutes 15    METs 1.6      Prescription Details   Frequency (times per week) 3    Duration Progress to 30 minutes of continuous aerobic without signs/symptoms of physical distress      Intensity   THRR 40-80% of Max Heartrate 110-130    Ratings of Perceived Exertion 11-13    Perceived Dyspnea 0-4           Exercise Goals: Frequency: Be able to perform aerobic exercise two to three times per week in program working toward 2-5 days per week of home exercise.  Intensity: Work with a perceived exertion of 11 (fairly light) - 15 (hard) while following your exercise prescription.  We will make changes to your prescription with you as you progress through the program.   Duration: Be able to do 30 to 45 minutes of continuous aerobic exercise in addition to a 5 minute warm-up and a 5 minute cool-down routine.   Nutrition Goals: Your personal nutrition goals will be established when you do your nutrition analysis with the dietician.  The following are general nutrition guidelines to follow: Cholesterol < 200mg /day Sodium < 1500mg /day Fiber: Men over 50 yrs - 30 grams per  day  Personal Goals:  Personal Goals and Risk Factors at Admission - 06/20/20 1055      Core Components/Risk Factors/Patient Goals on Admission    Weight Management Yes;Weight Gain    Intervention Weight Management: Develop a combined nutrition and exercise program designed to reach desired caloric intake, while maintaining appropriate intake of nutrient and fiber, sodium and fats, and appropriate energy expenditure required for the weight goal.;Weight Management: Provide education and appropriate resources to help participant work on and attain dietary goals.;Weight Management/Obesity: Establish reasonable short term and long term weight goals.    Admit Weight 113 lb 6.4 oz (51.4 kg)    Goal Weight: Short Term 120 lb (54.4 kg)    Goal Weight: Long Term 125 lb (56.7 kg)    Expected Outcomes Short Term: Continue to assess and modify interventions until short term weight is achieved;Long Term: Adherence to nutrition and physical activity/exercise program aimed toward attainment of established weight goal;Weight Maintenance: Understanding of the daily nutrition guidelines, which includes 25-35% calories from fat, 7% or less cal from saturated fats, less than 200mg  cholesterol, less than 1.5gm of sodium, & 5 or more servings of fruits  and vegetables daily;Understanding recommendations for meals to include 15-35% energy as protein, 25-35% energy from fat, 35-60% energy from carbohydrates, less than 200mg  of dietary cholesterol, 20-35 gm of total fiber daily;Understanding of distribution of calorie intake throughout the day with the consumption of 4-5 meals/snacks;Weight Gain: Understanding of general recommendations for a high calorie, high protein meal plan that promotes weight gain by distributing calorie intake throughout the day with the consumption for 4-5 meals, snacks, and/or supplements    Intervention Provide a combined exercise and nutrition program that is supplemented with education, support and  counseling about heart failure. Directed toward relieving symptoms such as shortness of breath, decreased exercise tolerance, and extremity edema.    Expected Outcomes Improve functional capacity of life;Short term: Daily weights obtained and reported for increase. Utilizing diuretic protocols set by physician.;Short term: Attendance in program 2-3 days a week with increased exercise capacity. Reported lower sodium intake. Reported increased fruit and vegetable intake. Reports medication compliance.;Long term: Adoption of self-care skills and reduction of barriers for early signs and symptoms recognition and intervention leading to self-care maintenance.    Intervention Provide education and support for participant on nutrition & aerobic/resistive exercise along with prescribed medications to achieve LDL 70mg , HDL >40mg .    Expected Outcomes Short Term: Participant states understanding of desired cholesterol values and is compliant with medications prescribed. Participant is following exercise prescription and nutrition guidelines.;Long Term: Cholesterol controlled with medications as prescribed, with individualized exercise RX and with personalized nutrition plan. Value goals: LDL < 70mg , HDL > 40 mg.           Tobacco Use Initial Evaluation: Social History   Tobacco Use  Smoking Status Former Smoker  . Packs/day: 0.50  . Years: 40.00  . Pack years: 20.00  . Quit date: 20  . Years since quitting: 25.5  Smokeless Tobacco Never Used    Exercise Goals and Review:  Exercise Goals    Row Name 06/20/20 1047             Exercise Goals   Increase Physical Activity Yes       Intervention Provide advice, education, support and counseling about physical activity/exercise needs.;Develop an individualized exercise prescription for aerobic and resistive training based on initial evaluation findings, risk stratification, comorbidities and participant's personal goals.       Expected Outcomes  Short Term: Attend rehab on a regular basis to increase amount of physical activity.;Long Term: Add in home exercise to make exercise part of routine and to increase amount of physical activity.;Long Term: Exercising regularly at least 3-5 days a week.       Increase Strength and Stamina Yes       Intervention Provide advice, education, support and counseling about physical activity/exercise needs.;Develop an individualized exercise prescription for aerobic and resistive training based on initial evaluation findings, risk stratification, comorbidities and participant's personal goals.       Expected Outcomes Short Term: Increase workloads from initial exercise prescription for resistance, speed, and METs.;Short Term: Perform resistance training exercises routinely during rehab and add in resistance training at home;Long Term: Improve cardiorespiratory fitness, muscular endurance and strength as measured by increased METs and functional capacity (6MWT)       Able to understand and use rate of perceived exertion (RPE) scale Yes       Intervention Provide education and explanation on how to use RPE scale       Expected Outcomes Short Term: Able to use RPE daily in rehab to express  subjective intensity level;Long Term:  Able to use RPE to guide intensity level when exercising independently       Knowledge and understanding of Target Heart Rate Range (THRR) Yes       Intervention Provide education and explanation of THRR including how the numbers were predicted and where they are located for reference       Expected Outcomes Short Term: Able to state/look up THRR;Short Term: Able to use daily as guideline for intensity in rehab;Long Term: Able to use THRR to govern intensity when exercising independently       Able to check pulse independently Yes       Intervention Provide education and demonstration on how to check pulse in carotid and radial arteries.;Review the importance of being able to check your own  pulse for safety during independent exercise       Expected Outcomes Short Term: Able to explain why pulse checking is important during independent exercise;Long Term: Able to check pulse independently and accurately       Understanding of Exercise Prescription Yes       Intervention Provide education, explanation, and written materials on patient's individual exercise prescription       Expected Outcomes Short Term: Able to explain program exercise prescription;Long Term: Able to explain home exercise prescription to exercise independently              Copy of goals given to participant.

## 2020-06-21 LAB — EPSTEIN BARR VRS(EBV DNA BY PCR)
EBV DNA QN by PCR: POSITIVE copies/mL
log10 EBV DNA Qn PCR: UNDETERMINED log10 copy/mL

## 2020-06-22 ENCOUNTER — Other Ambulatory Visit: Payer: Self-pay

## 2020-06-22 ENCOUNTER — Encounter: Payer: Medicare Other | Admitting: *Deleted

## 2020-06-22 DIAGNOSIS — I252 Old myocardial infarction: Secondary | ICD-10-CM | POA: Diagnosis not present

## 2020-06-22 DIAGNOSIS — Z951 Presence of aortocoronary bypass graft: Secondary | ICD-10-CM

## 2020-06-22 DIAGNOSIS — I213 ST elevation (STEMI) myocardial infarction of unspecified site: Secondary | ICD-10-CM

## 2020-06-22 NOTE — Progress Notes (Signed)
Daily Session Note  Patient Details  Name: David Perez MRN: [id number] Date of Birth: Mar 06, 1941 Referring Provider:     Cardiac Rehab from 06/20/2020 in [facility] Cardiac and Pulmonary Rehab  Referring Provider Aundra Dubin      Encounter Date: 06/22/2020  Check In:  Session Check In - 06/22/20 [place]      Check-In   Supervising physician immediately available to respond to emergencies See telemetry face sheet for immediately available ER MD    Location [facility]-Cardiac & Pulmonary Rehab    Staff Present Renita Papa, RN Margurite Auerbach, MS Exercise Physiologist;Amanda Oletta Darter, [place], ACSM CEP, Exercise Physiologist    Virtual Visit No    Medication changes reported     No    Fall or balance concerns reported    No    Warm-up and Cool-down Performed on first and last piece of equipment    Resistance Training Performed Yes    VAD Patient? No    PAD/SET Patient? No      Pain Assessment   Currently in Pain? No/denies              Social History   Tobacco Use  Smoking Status Former Smoker  . Packs/day: 0.50  . Years: 40.00  . Pack years: 20.00  . Quit date: 72  . Years since quitting: 25.5  Smokeless Tobacco Never Used    Goals Met:  Independence with exercise equipment Exercise tolerated well No report of cardiac concerns or symptoms Strength training completed today  Goals Unmet:  Not Applicable  Comments: First full day of exercise!  Patient was oriented to gym and equipment including functions, settings, policies, and procedures.  Patient's individual exercise prescription and treatment plan were reviewed.  All starting workloads were established based on the results of the 6 minute walk test done at initial orientation visit.  The plan for exercise progression was also introduced and progression will be customized based on patient's performance and goals.    Dr. Emily Filbert is Medical Director for [place] and [name].

## 2020-06-24 ENCOUNTER — Other Ambulatory Visit: Payer: Self-pay

## 2020-06-24 ENCOUNTER — Encounter: Payer: Medicare Other | Admitting: *Deleted

## 2020-06-24 DIAGNOSIS — Z951 Presence of aortocoronary bypass graft: Secondary | ICD-10-CM

## 2020-06-24 DIAGNOSIS — I252 Old myocardial infarction: Secondary | ICD-10-CM | POA: Diagnosis not present

## 2020-06-24 NOTE — Progress Notes (Signed)
Daily Session Note  Patient Details  Name: David Perez MRN: [id number] Date of Birth: January 09, 1941 Referring Provider:     Cardiac Rehab from 06/20/2020 in [facility] Cardiac and Pulmonary Rehab  Referring Provider Aundra Dubin      Encounter Date: 06/24/2020  Check In:  Session Check In - 06/24/20 5366      Check-In   Supervising physician immediately available to respond to emergencies See telemetry face sheet for immediately available ER MD    Location [facility]-Cardiac & Pulmonary Rehab    Staff Present Renita Papa, RN BSN;Jessica Luan Pulling, MA, RCEP, CCRP, CCET;Melissa Telford RDN, LDN    Virtual Visit No    Medication changes reported     No    Fall or balance concerns reported    No    Warm-up and Cool-down Performed on first and last piece of equipment    Resistance Training Performed Yes    VAD Patient? No    PAD/SET Patient? No      Pain Assessment   Currently in Pain? No/denies              Social History   Tobacco Use  Smoking Status Former Smoker  . Packs/day: 0.50  . Years: 40.00  . Pack years: 20.00  . Quit date: 65  . Years since quitting: 25.5  Smokeless Tobacco Never Used    Goals Met:  Independence with exercise equipment Exercise tolerated well No report of cardiac concerns or symptoms Strength training completed today  Goals Unmet:  Not Applicable  Comments: Pt able to follow exercise prescription today without complaint.  Will continue to monitor for progression.    Dr. Emily Filbert is Medical Director for [place] and [name].

## 2020-06-27 ENCOUNTER — Encounter: Payer: Medicare Other | Admitting: *Deleted

## 2020-06-27 ENCOUNTER — Other Ambulatory Visit: Payer: Self-pay

## 2020-06-27 DIAGNOSIS — Z951 Presence of aortocoronary bypass graft: Secondary | ICD-10-CM

## 2020-06-27 DIAGNOSIS — I252 Old myocardial infarction: Secondary | ICD-10-CM | POA: Diagnosis not present

## 2020-06-27 NOTE — Progress Notes (Signed)
Daily Session Note  Patient Details  Name: David Perez MRN: [id number] Date of Birth: 12/24/1940 Referring Provider:     Cardiac Rehab from 06/20/2020 in [facility] Cardiac and Pulmonary Rehab  Referring Provider Aundra Dubin      Encounter Date: 06/27/2020  Check In:  Session Check In - 06/27/20 0936      Check-In   Supervising physician immediately available to respond to emergencies See telemetry face sheet for immediately available ER MD    Location [facility]-Cardiac & Pulmonary Rehab    Staff Present Heath Lark, RN, BSN, Laveda Norman, BS, ACSM CEP, Exercise Physiologist;Joseph Tessie Fass RCP,RRT,BSRT    Virtual Visit No    Medication changes reported     No    Fall or balance concerns reported    No    Warm-up and Cool-down Performed on first and last piece of equipment    Resistance Training Performed Yes    VAD Patient? No    PAD/SET Patient? No      Pain Assessment   Currently in Pain? No/denies              Social History   Tobacco Use  Smoking Status Former Smoker  . Packs/day: 0.50  . Years: 40.00  . Pack years: 20.00  . Quit date: 39  . Years since quitting: 25.5  Smokeless Tobacco Never Used    Goals Met:  Independence with exercise equipment Exercise tolerated well No report of cardiac concerns or symptoms  Goals Unmet:  Not Applicable  Comments: Pt able to follow exercise prescription today without complaint.  Will continue to monitor for progression.    Dr. Emily Filbert is Medical Director for [place] and [name].

## 2020-06-29 ENCOUNTER — Encounter: Payer: Medicare Other | Admitting: *Deleted

## 2020-06-29 ENCOUNTER — Ambulatory Visit (INDEPENDENT_AMBULATORY_CARE_PROVIDER_SITE_OTHER): Payer: Medicare Other

## 2020-06-29 ENCOUNTER — Other Ambulatory Visit: Payer: Self-pay

## 2020-06-29 VITALS — Ht 69.0 in | Wt 113.0 lb

## 2020-06-29 DIAGNOSIS — I252 Old myocardial infarction: Secondary | ICD-10-CM | POA: Diagnosis not present

## 2020-06-29 DIAGNOSIS — I213 ST elevation (STEMI) myocardial infarction of unspecified site: Secondary | ICD-10-CM

## 2020-06-29 DIAGNOSIS — Z951 Presence of aortocoronary bypass graft: Secondary | ICD-10-CM

## 2020-06-29 DIAGNOSIS — Z0000 Encounter for general adult medical examination without abnormal findings: Secondary | ICD-10-CM | POA: Diagnosis not present

## 2020-06-29 NOTE — Patient Instructions (Addendum)
[name] , Thank you for taking time to come for your Medicare Wellness Visit. I appreciate your ongoing commitment to your health goals. Please review the following plan we discussed and let me know if I can assist you in the future.   These are the goals we discussed: Goals      Patient Stated   .  Maintain Healthy Lifestyle (pt-stated)      Stay active Stay hydrated  Healthy diet       This is a list of the screening recommended for you and due dates:  Health Maintenance  Topic Date Due  . Tetanus Vaccine  Never done  . Pneumonia vaccines (1 of 2 - PCV13) Never done  . Flu Shot  07/03/2020  . COVID-19 Vaccine  Completed    Immunizations Immunization History  Administered Date(s) Administered  . PFIZER SARS-COV-2 Vaccination 03/11/2020, 04/05/2020   Keep all routine maintenance appointments.   Follow up 9/8//21 @ 11:00  Advanced directives: End of life planning; Advance aging; Advanced directives discussed.  Copy of current HCPOA/Living Will requested.    Conditions/risks identified: none new.   Follow up in one year for your annual wellness visit.   Preventive Care 81 Years and Older, Male Preventive care refers to lifestyle choices and visits with your health care provider that can promote health and wellness. What does preventive care include?  A yearly physical exam. This is also called an annual well check.  Dental exams once or twice a year.  Routine eye exams. Ask your health care provider how often you should have your eyes checked.  Personal lifestyle choices, including:  Daily care of your teeth and gums.  Regular physical activity.  Eating a healthy diet.  Avoiding tobacco and drug use.  Limiting alcohol use.  Practicing safe sex.  Taking low doses of aspirin every day.  Taking vitamin and mineral supplements as recommended by your health care provider. What happens during an annual well check? The services and screenings done by  your health care provider during your annual well check will depend on your age, overall health, lifestyle risk factors, and family history of disease. Counseling  Your health care provider may ask you questions about your:  Alcohol use.  Tobacco use.  Drug use.  Emotional well-being.  Home and relationship well-being.  Sexual activity.  Eating habits.  History of falls.  Memory and ability to understand (cognition).  Work and work Statistician. Screening  You may have the following tests or measurements:  Height, weight, and BMI.  Blood pressure.  Lipid and cholesterol levels. These may be checked every 5 years, or more frequently if you are over 81 years old.  Skin check.  Lung cancer screening. You may have this screening every year starting at age 81 if you have a 30-pack-year history of smoking and currently smoke or have quit within the past 15 years.  Fecal occult blood test (FOBT) of the stool. You may have this test every year starting at age 81.  Flexible sigmoidoscopy or colonoscopy. You may have a sigmoidoscopy every 5 years or a colonoscopy every 10 years starting at age 81.  Prostate cancer screening. Recommendations will vary depending on your family history and other risks.  Hepatitis C blood test.  Hepatitis B blood test.  Sexually transmitted disease (STD) testing.  Diabetes screening. This is done by checking your blood sugar (glucose) after you have not eaten for a while (fasting). You may have this done every 1-3  years.  Abdominal aortic aneurysm (AAA) screening. You may need this if you are a current or former smoker.  Osteoporosis. You may be screened starting at age 81 if you are at high risk. Talk with your health care provider about your test results, treatment options, and if necessary, the need for more tests. Vaccines  Your health care provider may recommend certain vaccines, such as:  Influenza vaccine. This is recommended every  year.  Tetanus, diphtheria, and acellular pertussis (Tdap, Td) vaccine. You may need a Td booster every 10 years.  Zoster vaccine. You may need this after age 81.  Pneumococcal 13-valent conjugate (PCV13) vaccine. One dose is recommended after age 81.  Pneumococcal polysaccharide (PPSV23) vaccine. One dose is recommended after age 81. Talk to your health care provider about which screenings and vaccines you need and how often you need them. This information is not intended to replace advice given to you by your health care provider. Make sure you discuss any questions you have with your health care provider. Document Released: 12/16/2015 Document Revised: 08/08/2016 Document Reviewed: 09/20/2015 Elsevier Interactive Patient Education  2017 [place] Prevention in the Home Falls can cause injuries. They can happen to people of all ages. There are many things you can do to make your home safe and to help prevent falls. What can I do on the outside of my home?  Regularly fix the edges of walkways and driveways and fix any cracks.  Remove anything that might make you trip as you walk through a door, such as a raised step or threshold.  Trim any bushes or trees on the path to your home.  Use bright outdoor lighting.  Clear any walking paths of anything that might make someone trip, such as rocks or tools.  Regularly check to see if handrails are loose or broken. Make sure that both sides of any steps have handrails.  Any raised decks and porches should have guardrails on the edges.  Have any leaves, snow, or ice cleared regularly.  Use sand or salt on walking paths during winter.  Clean up any spills in your garage right away. This includes oil or grease spills. What can I do in the bathroom?  Use night lights.  Install grab bars by the toilet and in the tub and shower. Do not use towel bars as grab bars.  Use non-skid mats or decals in the tub or shower.  If you  need to sit down in the shower, use a plastic, non-slip stool.  Keep the floor dry. Clean up any water that spills on the floor as soon as it happens.  Remove soap buildup in the tub or shower regularly.  Attach bath mats securely with double-sided non-slip rug tape.  Do not have throw rugs and other things on the floor that can make you trip. What can I do in the bedroom?  Use night lights.  Make sure that you have a light by your bed that is easy to reach.  Do not use any sheets or blankets that are too big for your bed. They should not hang down onto the floor.  Have a firm chair that has side arms. You can use this for support while you get dressed.  Do not have throw rugs and other things on the floor that can make you trip. What can I do in the kitchen?  Clean up any spills right away.  Avoid walking on wet floors.  Keep items that  you use a lot in easy-to-reach places.  If you need to reach something above you, use a strong step stool that has a grab bar.  Keep electrical cords out of the way.  Do not use floor polish or wax that makes floors slippery. If you must use wax, use non-skid floor wax.  Do not have throw rugs and other things on the floor that can make you trip. What can I do with my stairs?  Do not leave any items on the stairs.  Make sure that there are handrails on both sides of the stairs and use them. Fix handrails that are broken or loose. Make sure that handrails are as long as the stairways.  Check any carpeting to make sure that it is firmly attached to the stairs. Fix any carpet that is loose or worn.  Avoid having throw rugs at the top or bottom of the stairs. If you do have throw rugs, attach them to the floor with carpet tape.  Make sure that you have a light switch at the top of the stairs and the bottom of the stairs. If you do not have them, ask someone to add them for you. What else can I do to help prevent falls?  Wear shoes  that:  Do not have high heels.  Have rubber bottoms.  Are comfortable and fit you well.  Are closed at the toe. Do not wear sandals.  If you use a stepladder:  Make sure that it is fully opened. Do not climb a closed stepladder.  Make sure that both sides of the stepladder are locked into place.  Ask someone to hold it for you, if possible.  Clearly mark and make sure that you can see:  Any grab bars or handrails.  First and last steps.  Where the edge of each step is.  Use tools that help you move around (mobility aids) if they are needed. These include:  Canes.  Walkers.  Scooters.  Crutches.  Turn on the lights when you go into a dark area. Replace any light bulbs as soon as they burn out.  Set up your furniture so you have a clear path. Avoid moving your furniture around.  If any of your floors are uneven, fix them.  If there are any pets around you, be aware of where they are.  Review your medicines with your doctor. Some medicines can make you feel dizzy. This can increase your chance of falling. Ask your doctor what other things that you can do to help prevent falls. This information is not intended to replace advice given to you by your health care provider. Make sure you discuss any questions you have with your health care provider. Document Released: 09/15/2009 Document Revised: 04/26/2016 Document Reviewed: 12/24/2014 Elsevier Interactive Patient Education  2017 [employer].

## 2020-06-29 NOTE — Progress Notes (Signed)
Daily Session Note  Patient Details  Name: David Perez MRN: [id number] Date of Birth: 10/19/1941 Referring Provider:     Cardiac Rehab from 06/20/2020 in [facility] Cardiac and Pulmonary Rehab  Referring Provider Aundra Dubin      Encounter Date: 06/29/2020  Check In:  Session Check In - 06/29/20 7654      Check-In   Supervising physician immediately available to respond to emergencies See telemetry face sheet for immediately available ER MD    Location [facility]-Cardiac & Pulmonary Rehab    Staff Present Renita Papa, RN BSN;Melissa Caiola RDN, Rowe Pavy, BA, ACSM CEP, Exercise Physiologist;Jessica Luan Pulling, MA, RCEP, CCRP, CCET    Virtual Visit No    Medication changes reported     No    Fall or balance concerns reported    No    Warm-up and Cool-down Performed on first and last piece of equipment    Resistance Training Performed Yes    VAD Patient? No    PAD/SET Patient? No      Pain Assessment   Currently in Pain? No/denies              Social History   Tobacco Use  Smoking Status Former Smoker  . Packs/day: 0.50  . Years: 40.00  . Pack years: 20.00  . Quit date: 31  . Years since quitting: 25.5  Smokeless Tobacco Never Used    Goals Met:  Independence with exercise equipment Exercise tolerated well No report of cardiac concerns or symptoms Strength training completed today  Goals Unmet:  Not Applicable  Comments: Pt able to follow exercise prescription today without complaint.  Will continue to monitor for progression.    Dr. Emily Filbert is Medical Director for [place] and [name].

## 2020-06-29 NOTE — Progress Notes (Signed)
Subjective:   David Perez is a 79 y.o. male who presents for an Initial Medicare Annual Wellness Visit.  Review of Systems    No ROS.  Medicare Wellness Virtual Visit.   Cardiac Risk Factors include: advanced age (>3men, >76 women)     Objective:    Today's Vitals   06/29/20 1335  Weight: 113 lb (51.3 kg)  Height: 5\' 9"  (1.753 m)   Body mass index is 16.69 kg/m.  Advanced Directives 06/29/2020 06/14/2020 01/08/2020 12/31/2019 12/31/2019  Does Patient Have a Medical Advance Directive? Yes Yes Yes Yes No  Type of Paramedic of [place];Living will [place];Living will [place];Living will Living will -  Does patient want to make changes to medical advance directive? No - Patient declined No - Patient declined No - Patient declined No - Patient declined -  Copy of [place] in Chart? No - copy requested - No - copy requested - -  Would patient like information on creating a medical advance directive? - - No - Patient declined - -    Current Medications (verified) Outpatient Encounter Medications as of 06/29/2020  Medication Sig  . apixaban (ELIQUIS) 2.5 MG TABS tablet Take 1 tablet (2.5 mg total) by mouth every 12 (twelve) hours.  Marland Kitchen atorvastatin (LIPITOR) 80 MG tablet Take 1 tablet (80 mg total) by mouth daily at 6 PM.  . megestrol (MEGACE) 20 MG tablet TAKE 1 TABLET BY MOUTH ONCE DAILY  . Melatonin 3 MG TABS Take 2 tablets (6 mg total) by mouth at bedtime. (Patient not taking: Reported on 05/10/2020)  . potassium chloride (KLOR-CON) 10 MEQ tablet Take 1 tablet (10 mEq total) by mouth daily.  . sacubitril-valsartan (ENTRESTO) 24-26 MG Take 1 tablet by mouth 2 (two) times daily.  Marland Kitchen spironolactone (ALDACTONE) 25 MG tablet Take 1 tablet (25 mg total) by mouth at bedtime.  . traZODone (DESYREL) 50 MG tablet Take 0.5-1 tablets (25-50 mg total) by mouth at bedtime as needed for sleep. (Patient not  taking: Reported on 05/10/2020)   No facility-administered encounter medications on file as of 06/29/2020.    Allergies (verified) Patient has no known allergies.   History: Past Medical History:  Diagnosis Date  . 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 Hepatitis B    Past Surgical History:  Procedure Laterality Date  . AORTIC VALVE REPLACEMENT N/A 12/31/2019   Procedure: AORTIC VALVE REPLACEMENT (AVR);  Surgeon: Wonda Olds, MD;  Location: [place];  Service: Open Heart Surgery;  Laterality: N/A;  . CLIPPING OF ATRIAL APPENDAGE Left 12/31/2019   Procedure: Clipping Of Atrial Appendage;  Surgeon: Wonda Olds, MD;  Location: [place];  Service: Open Heart Surgery;  Laterality: Left;  . CORONARY ARTERY BYPASS GRAFT N/A 12/31/2019   Procedure: CORONARY ARTERY BYPASS GRAFTING (CABG) TIMES THREE USING LEFT INTERNAL MAMMARY ARTERY AND LEFT GREATER SAPHENOUS LEG VEIN HARVESTED ENDOSCOPICALLY;  Surgeon: Wonda Olds, MD;  Location: [place];  Service: Open Heart Surgery;  Laterality: N/A;  POSSIBLE BILATERAL IMA  . CORONARY/GRAFT ACUTE MI REVASCULARIZATION N/A 12/31/2019   Procedure: Coronary/Graft Acute MI Revascularization;  Surgeon: Yolonda Kida, MD;  Location: [place] CV LAB;  Service: Cardiovascular;  Laterality: N/A;  . IR THORACENTESIS ASP PLEURAL SPACE W/IMG GUIDE  01/14/2020  . LEFT HEART CATH AND CORONARY ANGIOGRAPHY N/A 12/31/2019   Procedure: LEFT HEART CATH AND CORONARY ANGIOGRAPHY;  Surgeon: Yolonda Kida, MD;  Location: [place] CV [facility];  Service: Cardiovascular;  Laterality: N/A;  . MAZE N/A 12/31/2019   Procedure: MAZE;  Surgeon: Wonda Olds, MD;  Location: [place];  Service: Open Heart Surgery;  Laterality: N/A;  . TEE WITHOUT CARDIOVERSION N/A 12/31/2019   Procedure: TRANSESOPHAGEAL ECHOCARDIOGRAM (TEE);  Surgeon: Wonda Olds, MD;  Location: [place];  Service: Open Heart Surgery;  Laterality: N/A;   Family History  Problem  Relation Age of Onset  . Cancer Father    Social History   Socioeconomic History  . Marital status: Widowed    Spouse name: Not on file  . Number of children: Not on file  . Years of education: Not on file  . Highest education level: Not on file  Occupational History  . Not on file  Tobacco Use  . Smoking status: Former Smoker    Packs/day: 0.50    Years: 40.00    Pack years: 20.00    Quit date: 1996    Years since quitting: 25.5  . Smokeless tobacco: Never Used  Vaping Use  . Vaping Use: Never used  Substance and Sexual Activity  . Alcohol use: Not Currently  . Drug use: Not Currently  . Sexual activity: Not Currently  Other Topics Concern  . Not on file  Social History Narrative  . Not on file   Social Determinants of Health   Financial Resource Strain: Low Risk   . Difficulty of Paying Living Expenses: Not hard at all  Food Insecurity: No Food Insecurity  . Worried About Charity fundraiser in the Last Year: Never true  . Ran Out of Food in the Last Year: Never true  Transportation Needs: No Transportation Needs  . Lack of Transportation (Medical): No  . Lack of Transportation (Non-Medical): No  Physical Activity: Sufficiently Active  . Days of Exercise per Week: 3 days  . Minutes of Exercise per Session: 60 min  Stress: No Stress Concern Present  . Feeling of Stress : Not at all  Social Connections: Unknown  . Frequency of Communication with Friends and Family: More than three times a week  . Frequency of Social Gatherings with Friends and Family: Three times a week  . Attends Religious Services: Not on file  . Active Member of Clubs or Organizations: Not on file  . Attends Archivist Meetings: Not on file  . Marital Status: Widowed    Tobacco Counseling Counseling given: Not Answered   Clinical Intake:  Pre-visit preparation completed: Yes        Diabetes: No  How often do you need to have someone help you when you read  instructions, pamphlets, or other written materials from your doctor or pharmacy?: 1 - Never Interpreter Needed?: No      Activities of Daily Living In your present state of health, do you have any difficulty performing the following activities: 06/29/2020 01/08/2020  Hearing? N N  Vision? N N  Difficulty concentrating or making decisions? N N  Walking or climbing stairs? N Y  Dressing or bathing? N Y  Doing errands, shopping? N N  Preparing Food and eating ? N -  Using the Toilet? N -  In the past six months, have you accidently leaked urine? N -  Do you have problems with loss of bowel control? N -  Managing your Medications? N -  Managing your Finances? N -  Housekeeping or managing your Housekeeping? N -  Some recent data might be hidden  Patient Care Team: Marval Regal, NP as PCP - General (Nurse Practitioner)  Indicate any recent Medical Services you may have received from other than [facility] providers in the past year (date may be approximate).     Assessment:   This is a routine wellness examination for David Perez.  I connected with Asah today by telephone and verified that I am speaking with the correct person using two identifiers. Location patient: home Location provider: work Persons participating in the virtual visit: patient, Marine scientist.    I discussed the limitations, risks, security and privacy concerns of performing an evaluation and management service by telephone and the availability of in person appointments. The patient expressed understanding and verbally consented to this telephonic visit.    Interactive audio and video telecommunications were attempted between this provider and patient, however failed, due to patient having technical difficulties OR patient did not have access to video capability.  We continued and completed visit with audio only.  Some vital signs may be absent or patient reported.   Hearing/Vision screen  Hearing Screening    125Hz  250Hz  500Hz  1000Hz  2000Hz  3000Hz  4000Hz  6000Hz  8000Hz   Right ear:           Left ear:           Comments: Patient is able to hear conversational tones without difficulty.  No issues reported.    Vision Screening Comments: Followed by Suzie Portela Wears corrective lenses Visual acuity not assessed, virtual visit.  They have seen their ophthalmologist in the last 12 months.    Dietary issues and exercise activities discussed: Current Exercise Habits: Home exercise routine, Time (Minutes): 60, Frequency (Times/Week): 3, Weekly Exercise (Minutes/Week): 180, Intensity: Mild  Goals      Patient Stated   .  Maintain Healthy Lifestyle (pt-stated)      Stay active Stay hydrated  Healthy diet      Depression Screen PHQ 2/9 Scores 06/29/2020 06/20/2020 05/10/2020  PHQ - 2 Score 0 0 0  PHQ- 9 Score 0 0 0    Fall Risk Fall Risk  06/29/2020 06/14/2020 05/10/2020  Falls in the past year? 0 0 0  Number falls in past yr: 0 0 -  Injury with Fall? - 0 -  Risk for fall due to : - No Fall Risks History of fall(s)  Follow up Falls evaluation completed Falls evaluation completed;Education provided;Falls prevention discussed Falls evaluation completed   Handrails in use when climbing stairs? Yes  Home free of loose throw rugs in walkways, pet beds, electrical cords, etc? Yes  Adequate lighting in your home to reduce risk of falls? Yes   ASSISTIVE DEVICES UTILIZED TO PREVENT FALLS: Life alert? No  Use of a cane, walker or w/c? No  Grab bars in the bathroom? Yes  Shower chair or bench in shower? Yes    TIMED UP AND GO:  Was the test performed? No . Virtual visit.   Cognitive Function:     6CIT Screen 06/29/2020  What Year? 0 points  What month? 0 points  What time? 0 points  Months in reverse 0 points    Immunizations Immunization History  Administered Date(s) Administered  . PFIZER SARS-COV-2 Vaccination 03/11/2020, 04/05/2020   TDAP status: Due, Education has been provided  regarding the importance of this vaccine. Advised may receive this vaccine at local pharmacy or Health Dept. Aware to provide a copy of the vaccination record if obtained from local pharmacy or Health Dept. Verbalized acceptance and understanding. Deferred per patient preference.  Pneumococcal vaccine- Advised may receive this vaccine at local pharmacy or Health Dept. Aware to provide a copy of the vaccination record if obtained from local pharmacy or Health Dept. Verbalized acceptance and understanding. Deferred per patient preference.   Health Maintenance Health Maintenance  Topic Date Due  . TETANUS/TDAP  Never done  . PNA vac Low Risk Adult (1 of 2 - PCV13) Never done  . INFLUENZA VACCINE  07/03/2020  . COVID-19 Vaccine  Completed   Dental Screening: Recommended annual dental exams for proper oral hygiene. Dentures.   Community Resource Referral / Chronic Care Management: CRR required this visit?  No   CCM required this visit?  No      Plan:   Keep all routine maintenance appointments.   Follow up 9/8//21 @ 11:00  I have personally reviewed and noted the following in the patient's chart:   . Medical and social history . Use of alcohol, tobacco or illicit drugs  . Current medications and supplements . Functional ability and status . Nutritional status . Physical activity . Advanced directives . List of other physicians . Hospitalizations, surgeries, and ER visits in previous 12 months . Vitals . Screenings to include cognitive, depression, and falls . Referrals and appointments  In addition, I have reviewed and discussed with patient certain preventive protocols, quality metrics, and best practice recommendations. A written personalized care plan for preventive services as well as general preventive health recommendations were provided to patient via mychart.     Varney Biles, LPN   4/91/[id number]

## 2020-06-30 ENCOUNTER — Ambulatory Visit (HOSPITAL_COMMUNITY)
Admission: RE | Admit: 2020-06-30 | Discharge: 2020-06-30 | Disposition: A | Discharge status: Home / Self Care | Payer: Medicare Other | Source: Ambulatory Visit | Attending: Internal Medicine | Admitting: Internal Medicine

## 2020-06-30 ENCOUNTER — Other Ambulatory Visit: Payer: Self-pay

## 2020-06-30 DIAGNOSIS — I509 Heart failure, unspecified: Secondary | ICD-10-CM | POA: Insufficient documentation

## 2020-06-30 LAB — BASIC METABOLIC PANEL
Anion gap: 9 (ref 5–15)
BUN: 23 mg/dL (ref 8–23)
CO2: 19 mmol/L — ABNORMAL LOW (ref 22–32)
Calcium: 9.3 mg/dL (ref 8.9–10.3)
Chloride: 112 mmol/L — ABNORMAL HIGH (ref 98–111)
Creatinine, Ser: 1.11 mg/dL (ref 0.61–1.24)
GFR calc Af Amer: >60 mL/min (ref >60)
GFR calc non Af Amer: >60 mL/min (ref >60)
Glucose, Bld: 103 mg/dL — ABNORMAL HIGH (ref 70–99)
Potassium: 4.6 mmol/L (ref 3.5–5.1)
Sodium: 140 mmol/L (ref 135–145)

## 2020-07-01 ENCOUNTER — Encounter: Payer: Medicare Other | Admitting: *Deleted

## 2020-07-01 DIAGNOSIS — Z951 Presence of aortocoronary bypass graft: Secondary | ICD-10-CM

## 2020-07-01 DIAGNOSIS — I252 Old myocardial infarction: Secondary | ICD-10-CM | POA: Diagnosis not present

## 2020-07-01 NOTE — Progress Notes (Signed)
Daily Session Note  Patient Details  Name: David Perez MRN: [id number] Date of Birth: 05-17-1941 Referring Provider:     Cardiac Rehab from 06/20/2020 in [facility] Cardiac and Pulmonary Rehab  Referring Provider Aundra Dubin      Encounter Date: 07/01/2020  Check In:  Session Check In - 07/01/20 0930      Check-In   Supervising physician immediately available to respond to emergencies See telemetry face sheet for immediately available ER MD    Location [facility]-Cardiac & Pulmonary Rehab    Staff Present Renita Papa, RN BSN;Joseph [address] [place], [place], [place], CCRP, CCET    Virtual Visit No    Medication changes reported     No    Fall or balance concerns reported    No    Warm-up and Cool-down Performed on first and last piece of equipment    Resistance Training Performed Yes    VAD Patient? No    PAD/SET Patient? No      Pain Assessment   Currently in Pain? No/denies              Social History   Tobacco Use  Smoking Status Former Smoker  . Packs/day: 0.50  . Years: 40.00  . Pack years: 20.00  . Quit date: 74  . Years since quitting: 25.5  Smokeless Tobacco Never Used    Goals Met:  Independence with exercise equipment Exercise tolerated well No report of cardiac concerns or symptoms Strength training completed today  Goals Unmet:  Not Applicable  Comments: Pt able to follow exercise prescription today without complaint.  Will continue to monitor for progression.    Dr. Emily Filbert is Medical Director for [place] and [name].

## 2020-07-04 ENCOUNTER — Encounter: Payer: Medicare Other | Attending: Cardiology | Admitting: *Deleted

## 2020-07-04 ENCOUNTER — Other Ambulatory Visit: Payer: Self-pay

## 2020-07-04 DIAGNOSIS — I252 Old myocardial infarction: Secondary | ICD-10-CM | POA: Insufficient documentation

## 2020-07-04 DIAGNOSIS — Z951 Presence of aortocoronary bypass graft: Secondary | ICD-10-CM | POA: Insufficient documentation

## 2020-07-04 DIAGNOSIS — Z952 Presence of prosthetic heart valve: Secondary | ICD-10-CM | POA: Insufficient documentation

## 2020-07-04 DIAGNOSIS — Z87891 Personal history of nicotine dependence: Secondary | ICD-10-CM | POA: Insufficient documentation

## 2020-07-04 NOTE — Progress Notes (Signed)
Daily Session Note  Patient Details  Name: David Perez MRN: [id number] Date of Birth: Jul 17, 1941 Referring Provider:     Cardiac Rehab from 06/20/2020 in [facility] Cardiac and Pulmonary Rehab  Referring Provider Aundra Dubin      Encounter Date: 07/04/2020  Check In:  Session Check In - 07/04/20 0949      Check-In   Supervising physician immediately available to respond to emergencies See telemetry face sheet for immediately available ER MD    Location [facility]-Cardiac & Pulmonary Rehab    Staff Present Heath Lark, RN, BSN, Laveda Norman, BS, ACSM CEP, Exercise Physiologist;Joseph Tessie Fass RCP,RRT,BSRT    Virtual Visit No    Medication changes reported     No    Fall or balance concerns reported    No    Warm-up and Cool-down Performed on first and last piece of equipment    Resistance Training Performed Yes    VAD Patient? No    PAD/SET Patient? No      Pain Assessment   Currently in Pain? No/denies              Social History   Tobacco Use  Smoking Status Former Smoker  . Packs/day: 0.50  . Years: 40.00  . Pack years: 20.00  . Quit date: 16  . Years since quitting: 25.6  Smokeless Tobacco Never Used    Goals Met:  Independence with exercise equipment Exercise tolerated well No report of cardiac concerns or symptoms  Goals Unmet:  Not Applicable  Comments: Pt able to follow exercise prescription today without complaint.  Will continue to monitor for progression.    Dr. Emily Filbert is Medical Director for [place] and [name].

## 2020-07-05 ENCOUNTER — Encounter: Payer: Self-pay | Admitting: Oncology

## 2020-07-05 ENCOUNTER — Inpatient Hospital Stay: Payer: Medicare Other | Attending: Oncology | Admitting: Oncology

## 2020-07-05 VITALS — BP 116/73 | HR 97 | Temp 96.2°F | Resp 18

## 2020-07-05 DIAGNOSIS — Z7901 Long term (current) use of anticoagulants: Secondary | ICD-10-CM | POA: Insufficient documentation

## 2020-07-05 DIAGNOSIS — Z87891 Personal history of nicotine dependence: Secondary | ICD-10-CM | POA: Diagnosis not present

## 2020-07-05 DIAGNOSIS — D72829 Elevated white blood cell count, unspecified: Secondary | ICD-10-CM

## 2020-07-05 DIAGNOSIS — D539 Nutritional anemia, unspecified: Secondary | ICD-10-CM

## 2020-07-05 DIAGNOSIS — I2510 Atherosclerotic heart disease of native coronary artery without angina pectoris: Secondary | ICD-10-CM | POA: Diagnosis not present

## 2020-07-05 NOTE — Progress Notes (Signed)
Hematology/Oncology Consult note [facility] [phone] [phone]   Patient Care Team: Marval Regal, NP as PCP - General (Nurse Practitioner)  REFERRING PROVIDER: Marval Regal, NP  CHIEF COMPLAINTS/REASON FOR VISIT:  Evaluation of anemia  HISTORY OF PRESENTING ILLNESS:   David Perez is a  79 y.o.  male with PMH listed below was seen in consultation at the request of  Marval Regal, NP  for evaluation of anemia Reviewed patient's previous blood work.  05/10/2020, hemoglobin was 10.9, MCV 107.3, platelet count 245,000, WBC 9.2, monocyte 4.1.  Reviewed patient's previous blood work.  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  No earlier blood work is available. Patient had folate and vitamin B12 level checked which were all normal.  Adequate iron stores. Patient was referred to hematology for further evaluation  Patient reports that he had surgery in January 2 721.  Recently his heart medication has been adjusted by cardiologist.  Chronic anticoagulation with Eliquis 2.5 mg twice daily, he has recently been started on entresto,   INTERVAL HISTORY David Perez is a 79 y.o. male who has above history reviewed by me today presents for follow up visit for management of anemia Problems and complaints are listed below: Patient had blood work done during the interval and presents to discuss results.  He has no new complaints.  Review of Systems  Constitutional: Positive for fatigue. Negative for appetite change, chills, fever and unexpected weight change.  HENT:   Negative for hearing loss and voice change.   Eyes: Negative for eye problems and icterus.  Respiratory: Positive for shortness of breath. Negative for chest tightness and cough.   Cardiovascular: Negative for chest pain and leg swelling.  Gastrointestinal: Negative for abdominal distention and abdominal pain.  Endocrine: Negative for hot flashes.    Genitourinary: Negative for difficulty urinating, dysuria and frequency.   Musculoskeletal: Negative for arthralgias.  Skin: Negative for itching and rash.  Neurological: Negative for light-headedness and numbness.  Hematological: Negative for adenopathy. Does not bruise/bleed easily.  Psychiatric/Behavioral: Negative for confusion.    MEDICAL HISTORY:  Past Medical History:  Diagnosis Date   CAD (coronary artery disease) 01/03/2020   s/p CABG   Hepatitis B     SURGICAL HISTORY: Past Surgical History:  Procedure Laterality Date   AORTIC VALVE REPLACEMENT N/A 12/31/2019   Procedure: AORTIC VALVE REPLACEMENT (AVR);  Surgeon: Wonda Olds, MD;  Location: [place];  Service: Open Heart Surgery;  Laterality: N/A;   CLIPPING OF ATRIAL APPENDAGE Left 12/31/2019   Procedure: Clipping Of Atrial Appendage;  Surgeon: Wonda Olds, MD;  Location: [facility] OR;  Service: Open Heart Surgery;  Laterality: Left;   CORONARY ARTERY BYPASS GRAFT N/A 12/31/2019   Procedure: CORONARY ARTERY BYPASS GRAFTING (CABG) TIMES THREE USING LEFT INTERNAL MAMMARY ARTERY AND LEFT GREATER SAPHENOUS LEG VEIN HARVESTED ENDOSCOPICALLY;  Surgeon: Wonda Olds, MD;  Location: [place];  Service: Open Heart Surgery;  Laterality: N/A;  POSSIBLE BILATERAL IMA   CORONARY/GRAFT ACUTE MI REVASCULARIZATION N/A 12/31/2019   Procedure: Coronary/Graft Acute MI Revascularization;  Surgeon: Yolonda Kida, MD;  Location: [place] CV [facility];  Service: Cardiovascular;  Laterality: N/A;   IR THORACENTESIS ASP PLEURAL SPACE W/IMG GUIDE  01/14/2020   LEFT HEART CATH AND CORONARY ANGIOGRAPHY N/A 12/31/2019   Procedure: LEFT HEART CATH AND CORONARY ANGIOGRAPHY;  Surgeon: Yolonda Kida, MD;  Location: [place] [facility];  Service: Cardiovascular;  Laterality: N/A;  MAZE N/A 12/31/2019   Procedure: MAZE;  Surgeon: Wonda Olds, MD;  Location: [place];  Service: Open Heart Surgery;  Laterality: N/A;   TEE WITHOUT  CARDIOVERSION N/A 12/31/2019   Procedure: TRANSESOPHAGEAL ECHOCARDIOGRAM (TEE);  Surgeon: Wonda Olds, MD;  Location: [place];  Service: Open Heart Surgery;  Laterality: N/A;    SOCIAL HISTORY: Social History   Socioeconomic History   Marital status: Widowed    Spouse name: Not on file   Number of children: Not on file   Years of education: Not on file   Highest education level: Not on file  Occupational History   Not on file  Tobacco Use   Smoking status: Former Smoker    Packs/day: 0.50    Years: 40.00    Pack years: 20.00    Quit date: 41    Years since quitting: 25.6   Smokeless tobacco: Never Used  Scientific laboratory technician Use: Never used  Substance and Sexual Activity   Alcohol use: Not Currently   Drug use: Not Currently   Sexual activity: Not Currently  Other Topics Concern   Not on file  Social History Narrative   Not on file   Social Determinants of Health   Financial Resource Strain: Low Risk    Difficulty of Paying Living Expenses: Not hard at all  Food Insecurity: No Food Insecurity   Worried About Charity fundraiser in the Last Year: Never true   Arboriculturist in the Last Year: Never true  Transportation Needs: No Transportation Needs   Lack of Transportation (Medical): No   Lack of Transportation (Non-Medical): No  Physical Activity: Sufficiently Active   Days of Exercise per Week: 3 days   Minutes of Exercise per Session: 60 min  Stress: No Stress Concern Present   Feeling of Stress : Not at all  Social Connections: Unknown   Frequency of Communication with Friends and Family: More than three times a week   Frequency of Social Gatherings with Friends and Family: Three times a week   Attends Religious Services: Not on file   Active Member of Clubs or Organizations: Not on file   Attends Club or Organization Meetings: Not on file   Marital Status: Widowed  Human resources officer Violence: Not At Risk   Fear of Current or  Ex-Partner: No   Emotionally Abused: No   Physically Abused: No   Sexually Abused: No    FAMILY HISTORY: Family History  Problem Relation Age of Onset   Cancer Father     ALLERGIES:  has No Known Allergies.  MEDICATIONS:  Current Outpatient Medications  Medication Sig Dispense Refill   apixaban (ELIQUIS) 2.5 MG TABS tablet Take 1 tablet (2.5 mg total) by mouth every 12 (twelve) hours. 180 tablet 3   atorvastatin (LIPITOR) 80 MG tablet Take 1 tablet (80 mg total) by mouth daily at 6 PM. 90 tablet 3   megestrol (MEGACE) 20 MG tablet TAKE 1 TABLET BY MOUTH ONCE DAILY 30 tablet 2   potassium chloride (KLOR-CON) 10 MEQ tablet Take 1 tablet (10 mEq total) by mouth daily. 90 tablet 3   sacubitril-valsartan (ENTRESTO) 24-26 MG Take 1 tablet by mouth 2 (two) times daily. 60 tablet 11   spironolactone (ALDACTONE) 25 MG tablet Take 1 tablet (25 mg total) by mouth at bedtime. 90 tablet 3   Melatonin 3 MG TABS Take 2 tablets (6 mg total) by mouth at bedtime. (Patient not taking: Reported on 05/10/2020)  0   traZODone (DESYREL) 50 MG tablet Take 0.5-1 tablets (25-50 mg total) by mouth at bedtime as needed for sleep. (Patient not taking: Reported on 05/10/2020) 15 tablet 0   No current facility-administered medications for this visit.     PHYSICAL EXAMINATION: ECOG PERFORMANCE STATUS: 1 - Symptomatic but completely ambulatory Vitals:   07/05/20 1403  BP: 116/73  Pulse: 97  Resp: 18  Temp: (!) 96.2 F (35.7 C)   There were no vitals filed for this visit.  Physical Exam Constitutional:      General: He is not in acute distress. HENT:     Head: Normocephalic and atraumatic.  Eyes:     General: No scleral icterus. Cardiovascular:     Rate and Rhythm: Normal rate and regular rhythm.     Heart sounds: Normal heart sounds.  Pulmonary:     Effort: Pulmonary effort is normal. No respiratory distress.     Breath sounds: No wheezing.  Abdominal:     General: Bowel sounds are  normal. There is no distension.     Palpations: Abdomen is soft.  Musculoskeletal:        General: No deformity. Normal range of motion.     Cervical back: Normal range of motion and neck supple.  Skin:    General: Skin is warm and dry.     Findings: No erythema or rash.  Neurological:     Mental Status: He is alert and oriented to person, place, and time. Mental status is at baseline.     Cranial Nerves: No cranial nerve deficit.     Coordination: Coordination normal.  Psychiatric:        Mood and Affect: Mood normal.     LABORATORY DATA:  I have reviewed the data as listed Lab Results  Component Value Date   WBC 10.6 (H) 06/16/2020   HGB 11.1 (L) 06/16/2020   HCT 31.4 (L) 06/16/2020   MCV 104.0 (H) 06/16/2020   PLT 284 06/16/2020   Recent Labs    02/22/20 1042 02/22/20 1042 05/10/20 1101 05/10/20 1101 06/10/20 1158 06/16/20 1005 06/30/20 1259  NA 137   < > 139   < > 141 140 140  K 3.7   < > 3.7   < > 4.5 4.5 4.6  CL 105   < > 104   < > 109 109 112*  CO2 26   < > 29   < > 24 24 19*  GLUCOSE 112*   < > 104*   < > 100* 117* 103*  BUN 21   < > 22   < > 20 27* 23  CREATININE 1.30   < > 1.15   < > 1.04 1.26* 1.11  CALCIUM 9.3   < > 9.2   < > 9.5 9.6 9.3  GFRNONAA  --   --   --   --  >60 53* >60  GFRAA  --   --   --   --  >60 >60 >60  PROT 7.2  --  7.1  --   --  8.4*  --   ALBUMIN 3.5  --  3.9  --   --  4.1  --   AST 27  --  22  --   --  32  --   ALT 30  --  20  --   --  27  --   ALKPHOS 92  --  111  --   --  79  --  BILITOT 1.4*  --  1.1  --   --  1.5*  --   BILIDIR 0.3  --   --   --   --   --   --    < > = values in this interval not displayed.   Iron/TIBC/Ferritin/ %Sat    Component Value Date/Time   IRON 103 05/30/2020 1037   TIBC 257 05/30/2020 1037   FERRITIN 413 (H) 05/30/2020 1037   IRONPCTSAT 40 05/30/2020 1037      RADIOGRAPHIC STUDIES: I have personally reviewed the radiological images as listed and agreed with the findings in the  report. ECHOCARDIOGRAM COMPLETE  Result Date: 06/10/2020    ECHOCARDIOGRAM REPORT   Patient Name:   PRANIT OWENSBY Date of Exam: 06/10/2020 Medical Rec #:  [id number]          Height:       69.0 in Accession #:    [id number]         Weight:       113.0 lb Date of Birth:  11/28/1941          BSA:          1.621 m Patient Age:    20 years           BP:           139/66 mmHg Patient Gender: M                  HR:           87 bpm. Exam Location:  Inpatient Procedure: Cardiac Doppler and Color Doppler Indications:    Congestive Heart Failure  History:        Patient has prior history of Echocardiogram examinations, most                 recent 01/06/2020. CHF, Previous Myocardial Infarction and CAD,                 Prior CABG; Risk Factors:Former Smoker. AVR.  Sonographer:    Clayton Lefort RDCS (AE) Referring Phys: [phone] AMY D CLEGG IMPRESSIONS  1. Left ventricular ejection fraction, by estimation, is 45 to 50%. The left ventricle has mildly decreased function. The left ventricle demonstrates regional wall motion abnormalities, mid anteroseptal/apical septal severe hypokinesis. There is mild left ventricular hypertrophy. Left ventricular diastolic parameters are consistent with Grade II diastolic dysfunction (pseudonormalization).  2. Right ventricular systolic function is normal. The right ventricular size is normal. There is mildly elevated pulmonary artery systolic pressure. The estimated right ventricular systolic pressure is 82.6 mmHg.  3. Left atrial size was mildly dilated.  4. Right atrial size was mildly dilated.  5. The mitral valve is degenerative. Mild mitral valve regurgitation. No evidence of mitral stenosis.  6. Tricuspid valve regurgitation is mild to moderate.  7. Bioprosthetic aortic valve functions normally. Aortic valve regurgitation is not visualized. No aortic stenosis is present. Aortic valve mean gradient measures 13.0 mmHg.  8. The inferior vena cava is normal in size with greater than 50%  respiratory variability, suggesting right atrial pressure of 3 mmHg. FINDINGS  Left Ventricle: Left ventricular ejection fraction, by estimation, is 45 to 50%. The left ventricle has mildly decreased function. The left ventricle demonstrates regional wall motion abnormalities. The left ventricular internal cavity size was normal in size. There is mild left ventricular hypertrophy. Left ventricular diastolic parameters are consistent with Grade II diastolic dysfunction (pseudonormalization). Right Ventricle: The right ventricular size is normal. No increase  in right ventricular wall thickness. Right ventricular systolic function is normal. There is mildly elevated pulmonary artery systolic pressure. The tricuspid regurgitant velocity is 3.21  m/s, and with an assumed right atrial pressure of 3 mmHg, the estimated right ventricular systolic pressure is 41.9 mmHg. Left Atrium: Left atrial size was mildly dilated. Right Atrium: Right atrial size was mildly dilated. Pericardium: There is no evidence of pericardial effusion. Mitral Valve: The mitral valve is degenerative in appearance. There is mild calcification of the mitral valve leaflet(s). Moderate mitral annular calcification. Mild mitral valve regurgitation. No evidence of mitral valve stenosis. MV peak gradient, 8.5 mmHg. The mean mitral valve gradient is 4.0 mmHg. Tricuspid Valve: The tricuspid valve is normal in structure. Tricuspid valve regurgitation is mild to moderate. Aortic Valve: Bioprosthetic aortic valve functions normally. The aortic valve has been repaired/replaced. Aortic valve regurgitation is not visualized. No aortic stenosis is present. Aortic valve mean gradient measures 13.0 mmHg. Aortic valve peak gradient measures 20.3 mmHg. Aortic valve area, by VTI measures 1.87 cm. Pulmonic Valve: The pulmonic valve was normal in structure. Pulmonic valve regurgitation is not visualized. Aorta: The aortic root is normal in size and structure. Venous: The  inferior vena cava is normal in size with greater than 50% respiratory variability, suggesting right atrial pressure of 3 mmHg. IAS/Shunts: No atrial level shunt detected by color flow Doppler.  LEFT VENTRICLE PLAX 2D LVIDd:         3.90 cm      Diastology LVIDs:         2.90 cm      LV e' lateral:   8.92 cm/s LV PW:         1.20 cm      LV E/e' lateral: 16.4 LV IVS:        1.30 cm      LV e' medial:    6.96 cm/s LVOT diam:     1.90 cm      LV E/e' medial:  21.0 LV SV:         83 LV SV Index:   51 LVOT Area:     2.84 cm                              3D Volume EF: LV Volumes (MOD)            3D EF:        48 % LV vol d, MOD A2C: 115.0 ml LV EDV:       151 ml LV vol d, MOD A4C: 113.0 ml LV ESV:       78 ml LV vol s, MOD A2C: 48.3 ml  LV SV:        73 ml LV vol s, MOD A4C: 60.8 ml LV SV MOD A2C:     66.7 ml LV SV MOD A4C:     113.0 ml LV SV MOD BP:      62.5 ml RIGHT VENTRICLE            IVC RV Basal diam:  3.60 cm    IVC diam: 1.80 cm RV Mid diam:    2.70 cm RV S prime:     9.25 cm/s TAPSE (M-mode): 1.6 cm LEFT ATRIUM             Index       RIGHT ATRIUM           Index LA diam:  3.40 cm 2.10 cm/m  RA Area:     19.60 cm LA Vol (A2C):   68.4 ml 42.20 ml/m RA Volume:   61.30 ml  37.82 ml/m LA Vol (A4C):   28.0 ml 17.27 ml/m LA Biplane Vol: 43.6 ml 26.90 ml/m  AORTIC VALVE AV Area (Vmax):    1.87 cm AV Area (Vmean):   1.74 cm AV Area (VTI):     1.87 cm AV Vmax:           225.33 cm/s AV Vmean:          157.000 cm/s AV VTI:            0.444 m AV Peak Grad:      20.3 mmHg AV Mean Grad:      13.0 mmHg LVOT Vmax:         149.00 cm/s LVOT Vmean:        96.200 cm/s LVOT VTI:          0.292 m LVOT/AV VTI ratio: 0.66  AORTA Ao Root diam: 3.60 cm MITRAL VALVE                TRICUSPID VALVE MV Area (PHT): 2.96 cm     TR Peak grad:   41.2 mmHg MV Peak grad:  8.5 mmHg     TR Vmax:        321.00 cm/s MV Mean grad:  4.0 mmHg MV Vmax:       1.46 m/s     SHUNTS MV Vmean:      89.1 cm/s    Systemic VTI:  0.29 m MV Decel  Time: 256 msec     Systemic Diam: 1.90 cm MV E velocity: 146.00 cm/s MV A velocity: 129.00 cm/s MV E/A ratio:  1.13 Loralie Champagne MD Electronically signed by Loralie Champagne MD Signature Date/Time: 06/10/2020/11:44:27 AM    Final       ASSESSMENT & PLAN:  1. Macrocytic anemia   2. Leukocytosis, unspecified type    #Macrocytic anemia, hemoglobin stable at 11.1, MCV 104. Smear showed mixed RBC population with polychromasia, teardrop, target cells and rare fragmented RBCs. Slightly increased LDH to 255, Flow cytometry showed no significant immunophenotypic abnormalities Multiple myeloma panel showed polyclonal increase detected in 1 or more immunoglobulins.  No M protein.  Elevated kappa lambda ratio at 2.36.  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 Patient has normal vitamin B12 and folate level. I discussed that I suspect that patient may have underlying bone marrow disorders, i.e. myelodysplastic syndromes. I discussed with him about recommendation of bone marrow biopsy. Patient would like to be observed for now which I think is reasonable given that his counts are stable at this point. Repeat blood work in 6 months.  Discussed with patient about red flag symptoms including unintentional weight loss, fever with no explanation, night sweating etc.  Patient was noted by [name] if he experience these symptoms. Leukocytosis, likely reactive.  Flow cytometry was negative.  Orders Placed This Encounter  Procedures   CBC with Differential/Platelet    Standing Status:   Future    Standing Expiration Date:   07/05/2021   Comprehensive metabolic panel    Standing Status:   Future    Standing Expiration Date:   07/05/2021   Vitamin B12    Standing Status:   Future    Standing Expiration Date:   07/05/2021   Folate    Standing Status:   Future    Standing Expiration Date:  07/05/2021   Lactate dehydrogenase    Standing Status:   Future    Standing Expiration Date:   07/05/2021    Pathologist smear review    Standing Status:   Future    Standing Expiration Date:   07/05/2021    All questions were answered. The patient knows to call the clinic with any problems questions or concerns.  cc Marval Regal, NP    Return of visit: 4 months thank you for this kind referral and the opportunity to participate in the care of this patient. A copy of today's note is routed to referring provider    Earlie Server, MD, PhD Hematology Oncology [facility] Pager- [phone] 07/05/2020 c

## 2020-07-05 NOTE — Progress Notes (Signed)
Patient denies new problems/concerns today.   °

## 2020-07-06 ENCOUNTER — Encounter (HOSPITAL_COMMUNITY): Payer: Self-pay

## 2020-07-06 ENCOUNTER — Ambulatory Visit (HOSPITAL_COMMUNITY)
Admission: RE | Admit: 2020-07-06 | Discharge: 2020-07-06 | Disposition: A | Discharge status: Home / Self Care | Payer: Medicare Other | Source: Ambulatory Visit | Attending: Cardiology | Admitting: Cardiology

## 2020-07-06 ENCOUNTER — Other Ambulatory Visit: Payer: Self-pay

## 2020-07-06 ENCOUNTER — Encounter: Payer: Medicare Other | Admitting: *Deleted

## 2020-07-06 VITALS — BP 116/68 | HR 98 | Wt 112.4 lb

## 2020-07-06 DIAGNOSIS — Z7901 Long term (current) use of anticoagulants: Secondary | ICD-10-CM | POA: Diagnosis not present

## 2020-07-06 DIAGNOSIS — I252 Old myocardial infarction: Secondary | ICD-10-CM | POA: Insufficient documentation

## 2020-07-06 DIAGNOSIS — I5022 Chronic systolic (congestive) heart failure: Secondary | ICD-10-CM | POA: Insufficient documentation

## 2020-07-06 DIAGNOSIS — I255 Ischemic cardiomyopathy: Secondary | ICD-10-CM | POA: Diagnosis not present

## 2020-07-06 DIAGNOSIS — I4891 Unspecified atrial fibrillation: Secondary | ICD-10-CM | POA: Insufficient documentation

## 2020-07-06 DIAGNOSIS — Z951 Presence of aortocoronary bypass graft: Secondary | ICD-10-CM

## 2020-07-06 DIAGNOSIS — E785 Hyperlipidemia, unspecified: Secondary | ICD-10-CM | POA: Diagnosis not present

## 2020-07-06 DIAGNOSIS — I2510 Atherosclerotic heart disease of native coronary artery without angina pectoris: Secondary | ICD-10-CM | POA: Insufficient documentation

## 2020-07-06 DIAGNOSIS — Z79899 Other long term (current) drug therapy: Secondary | ICD-10-CM | POA: Insufficient documentation

## 2020-07-06 MED ORDER — CARVEDILOL 3.125 MG PO TABS
3.1250 mg | ORAL_TABLET | Freq: Two times a day (BID) | ORAL | 11 refills | Status: DC
Start: 2020-07-06 — End: 2020-08-22

## 2020-07-06 NOTE — Progress Notes (Signed)
Daily Session Note  Patient Details  Name: David Perez MRN: [id number] Date of Birth: January 10, 1941 Referring Provider:     Cardiac Rehab from 06/20/2020 in [facility]  Referring Provider Aundra Dubin      Encounter Date: 07/06/2020  Check In:  Session Check In - 07/06/20 0939      Check-In   Supervising physician immediately available to respond to emergencies See telemetry face sheet for immediately available ER MD    Location [facility]-Cardiac & Pulmonary Rehab    Staff Present Heath Lark, RN, BSN, CCRP;Joseph Foy [place], [place], ACSM CEP, Exercise Physiologist    Virtual Visit No    Medication changes reported     No    Fall or balance concerns reported    No    Warm-up and Cool-down Performed on first and last piece of equipment    Resistance Training Performed Yes    VAD Patient? No    PAD/SET Patient? No      Pain Assessment   Currently in Pain? No/denies              Social History   Tobacco Use  Smoking Status Former Smoker  . Packs/day: 0.50  . Years: 40.00  . Pack years: 20.00  . Quit date: 27  . Years since quitting: 25.6  Smokeless Tobacco Never Used    Goals Met:  Exercise tolerated well No report of cardiac concerns or symptoms  Goals Unmet:  Not Applicable  Comments: Pt able to follow exercise prescription today without complaint.  Will continue to monitor for progression.    Dr. Emily Filbert is Medical Director for [facility] [name].

## 2020-07-06 NOTE — Progress Notes (Signed)
PCP: Marval Regal, NP PCP-Cardiologist: Dr. Aundra Dubin  HPI:  79 y.o. 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 He underwent CABG w/ LIMA-LAD, SVG-OM2, SVG-LPDA + bioprosthetic AVR and Maze procedure + LAA clip. Post-op required milrinone for RV support. He developed post-op left pleural effusion requiring thoracentesis. Once medically stable, he was transferred to [facility] for therapy and ultimately discharged home on 01/29/20.  Recently presented to HF clinic on 06/10/20 with Dr. Aundra Dubin. Echo was obtained and reviewed, revealing an EF of 45-50%, mid AS and apical septal severe hypokinesis, normal RV, normal bioprosthetic aortic valve mean gradient 13 mmHg. Reported doing well; rare dyspnea only with heavy exertion.  No chest pain.  No palpitations.  Lives alone but family is nearby.  Weight was up 5 lbs, he stated that he was eating better. No palpitations. He was off carvedilol due to bradycardia.  Today he returns to HF clinic for pharmacist medication titration. At last visit with MD, losartan was discontinued and Entresto 24/26 mg BID was initiated. Additionally, furosemide and digoxin were discontinued. Reports feeling good overall. Denies dizziness, lightheadedness, fatigue, CP, palpitations. No holistic breathing problems, but says that he occasionally has a "double breath" when he breathes deeply. Home weights are stable at 112 lbs, down from 115 lbs since going to cardiac rehab. Not taking a diuretic. No PND, orthopnea. No LEE on exam. Adequate appetite, breaks up meals per dietician. Patient was very distressed today about the onset of incontinence symptoms.  He attributes frequent urinary urgency to starting spironolactone.  HF Medications: Entresto 24-26 mg BID Spironolactone 25 mg qHS  Has the patient been experiencing any side  effects to the medications prescribed?  no  Does the patient have any problems obtaining medications due to transportation or finances?   Yes - has Medicare part D but Entresto cost is too high. Will apply for [employer] patient assistance today. He has [employer] patient assistance for Eliquis.   Understanding of regimen: good Understanding of indications: good Potential of compliance: good Patient understands to avoid NSAIDs. Patient understands to avoid decongestants.    Pertinent Lab Values (06/30/20): Marland Kitchen Serum creatinine 1.11, BUN 23, Potassium 4.6, Sodium 140, BNP 428.1 pg/mL (01/25/20)  Vital Signs: . Weight: 112.4 lbs (last clinic weight: 113 lbs) . Blood pressure: 116/68  . Heart rate: 98   Assessment: 1. CAD: S/p STEMI 12/2019 c/b cardiogenic shock requiring IABP; 99% LM stenosis noted. Now s/p CABG with LIMA-LAD, SVG-OM2, SVG-LPDA. No chest pain.  - No ASA given Eliquis use.  -Continue atorvastatin 80mg  daily, good lipids in 05/2020.  - Refer to cardiac rehab.   2. Chronic systolic CHF: Ischemic cardiomyopathy.  Echo in 2/21 with EF 30%. Now s/p CABG-AVR. Echo was done 06/10/20 and reviewed, EF improved to 45-50%.  - NYHA class II symptoms.  He is not volume overloaded on exam.  - Not taking any diuretic.  - Start carvedilol 3.125 mg BID. - Continue Entresto 24/26 mg BID - Continue spironolactone 25 mg daily.  - Digoxin discontinued 06/10/20 due to improvement in HF symptoms.    3. Atrial fibrillation: S/p Maze, LA appendage clip in 1/21.NSR 06/10/20.  - Continue apixaban 2.5 mg twice a day. Lower dose with age and weight. No bleeding issues.    4. Hyperlipidemia: Good lipids in 6/21.  -Continueatorvastatin 80 qhs.    Plan: 1) Medication  changes: Based on clinical presentation, vital signs and recent labs will start carvedilol 3.125 mg BID. 2) Follow-up with pharmacy in 4 weeks.   Audry Riles, PharmD, BCPS, BCCP, CPP Heart Failure Clinic Pharmacist [phone]

## 2020-07-06 NOTE — Patient Instructions (Addendum)
It was a pleasure seeing you today!  MEDICATIONS: -We are changing your medications today -Start taking carvedilol 3.125 mg (1 tablet) twice daily -Call if you have questions about your medications.   NEXT APPOINTMENT: Return to clinic in 4 weeks with Pharmacy [facility].  In general, to take care of your heart failure: -Limit your fluid intake to 2 Liters (half-gallon) per day.   -Limit your salt intake to ideally 2-3 grams (2000-3000 mg) per day. -Weigh yourself daily and record, and bring that "weight diary" to your next appointment.  (Weight gain of 2-3 pounds in 1 day typically means fluid weight.) -The medications for your heart are to help your heart and help you live longer.   -Please contact us before stopping any of your heart medications.  Call the clinic at [phone] with questions or to reschedule future appointments.

## 2020-07-07 ENCOUNTER — Telehealth (HOSPITAL_COMMUNITY): Payer: Self-pay | Admitting: Pharmacist

## 2020-07-07 NOTE — Telephone Encounter (Signed)
Sent in Manufacturer's Assistance application to [employer] for Entresto.    Application pending, will continue to follow.  Sheccid Lahmann, PharmD, BCPS, BCCP, CPP Heart Failure Clinic Pharmacist [phone]  

## 2020-07-08 ENCOUNTER — Other Ambulatory Visit: Payer: Self-pay

## 2020-07-08 ENCOUNTER — Encounter: Payer: Medicare Other | Admitting: *Deleted

## 2020-07-08 DIAGNOSIS — I252 Old myocardial infarction: Secondary | ICD-10-CM | POA: Diagnosis not present

## 2020-07-08 DIAGNOSIS — Z952 Presence of prosthetic heart valve: Secondary | ICD-10-CM

## 2020-07-08 DIAGNOSIS — Z951 Presence of aortocoronary bypass graft: Secondary | ICD-10-CM

## 2020-07-08 DIAGNOSIS — I213 ST elevation (STEMI) myocardial infarction of unspecified site: Secondary | ICD-10-CM

## 2020-07-08 NOTE — Progress Notes (Signed)
Daily Session Note  Patient Details  Name: David Perez MRN: [id number] Date of Birth: 1941/04/16 Referring Provider:     Cardiac Rehab from 06/20/2020 in [facility]  Referring Provider Aundra Dubin      Encounter Date: 07/08/2020  Check In:  Session Check In - 07/08/20 7867      Check-In   Supervising physician immediately available to respond to emergencies See telemetry face sheet for immediately available ER MD    Location [facility]-Cardiac & Pulmonary Rehab    Staff Present Renita Papa, RN BSN;Joseph Lou Miner, [place] Exercise Physiologist    Virtual Visit No    Medication changes reported     Yes    Comments starting Carvedilol, Entresto and Spironolactone    Fall or balance concerns reported    No    Warm-up and Cool-down Performed on first and last piece of equipment    Resistance Training Performed Yes    VAD Patient? No    PAD/SET Patient? No      Pain Assessment   Currently in Pain? No/denies              Social History   Tobacco Use  Smoking Status Former Smoker  . Packs/day: 0.50  . Years: 40.00  . Pack years: 20.00  . Quit date: 34  . Years since quitting: 25.6  Smokeless Tobacco Never Used    Goals Met:  Independence with exercise equipment Exercise tolerated well No report of cardiac concerns or symptoms Strength training completed today  Goals Unmet:  Not Applicable  Comments: Pt able to follow exercise prescription today without complaint.  Will continue to monitor for progression.    Dr. Emily Filbert is Medical Director for [place] and [name].

## 2020-07-11 ENCOUNTER — Other Ambulatory Visit: Payer: Self-pay

## 2020-07-11 ENCOUNTER — Telehealth (HOSPITAL_COMMUNITY): Payer: Self-pay | Admitting: Pharmacy Technician

## 2020-07-11 ENCOUNTER — Encounter: Payer: Medicare Other | Admitting: *Deleted

## 2020-07-11 DIAGNOSIS — I252 Old myocardial infarction: Secondary | ICD-10-CM | POA: Diagnosis not present

## 2020-07-11 DIAGNOSIS — Z951 Presence of aortocoronary bypass graft: Secondary | ICD-10-CM

## 2020-07-11 NOTE — Telephone Encounter (Signed)
Advanced Heart Failure Patient Advocate Encounter   Patient was approved to receive Entresto assistance from [employer].  Patient ID: [id number] Effective dates: 07/08/20 through 12/02/20  I have spoken with the patient's daughter and provided approval and phone number to [employer].  Advised her to call me with any issues.  Charlann Boxer, CPhT

## 2020-07-11 NOTE — Progress Notes (Signed)
Daily Session Note  Patient Details  Name: David Perez MRN: [id number] Date of Birth: 04/24/1941 Referring Provider:     Cardiac Rehab from 06/20/2020 in [facility] Card[facility] Re[facility]  Referring Provider Aundra Dubin      Encounter Date: 07/11/2020  Check In:  Session Check In - 07/11/20 9499      Check-In   Supervising physician immediately available to respond to emergencies See telemetry face sheet for immediately available ER MD    Location [facility]-Cardiac & Pulmonary Rehab    Staff Present Heath Lark, RN, BSN, Laveda Norman, BS, ACSM CEP, Exercise Physiologist;Joseph Tessie Fass RCP,RRT,BSRT    Virtual Visit No    Medication changes reported     No    Fall or balance concerns reported    No    Warm-up and Cool-down Performed on first and last piece of equipment    Resistance Training Performed Yes    VAD Patient? No    PAD/SET Patient? No      Pain Assessment   Currently in Pain? No/denies              Social History   Tobacco Use  Smoking Status Former Smoker  . Packs/day: 0.50  . Years: 40.00  . Pack years: 20.00  . Quit date: 45  . Years since quitting: 25.6  Smokeless Tobacco Never Used    Goals Met:  Independence with exercise equipment Exercise tolerated well No report of cardiac concerns or symptoms  Goals Unmet:  Not Applicable  Comments: Pt able to follow exercise prescription today without complaint.  Will continue to monitor for progression.    Dr. Emily Filbert is Medical Director for [place] and [name].

## 2020-07-13 ENCOUNTER — Encounter: Payer: Medicare Other | Admitting: *Deleted

## 2020-07-13 ENCOUNTER — Other Ambulatory Visit: Payer: Self-pay

## 2020-07-13 ENCOUNTER — Encounter: Payer: Self-pay | Admitting: *Deleted

## 2020-07-13 DIAGNOSIS — Z951 Presence of aortocoronary bypass graft: Secondary | ICD-10-CM

## 2020-07-13 DIAGNOSIS — I213 ST elevation (STEMI) myocardial infarction of unspecified site: Secondary | ICD-10-CM

## 2020-07-13 DIAGNOSIS — I252 Old myocardial infarction: Secondary | ICD-10-CM | POA: Diagnosis not present

## 2020-07-13 NOTE — Progress Notes (Signed)
Cardiac Individual Treatment Plan  Patient Details  Name: David Perez MRN: [id number] Date of Birth: 1941/04/30 Referring Provider:     Cardiac Rehab from 06/20/2020 in [facility] Cardiac and Pulmonary Rehab  Referring Provider Aundra Dubin      Initial Encounter Date:    Cardiac Rehab from 06/20/2020 in [facility] Cardiac and Pulmonary Rehab  Date 06/20/20      Visit Diagnosis: S/P CABG x 3  Patient's Home Medications on Admission:  Current Outpatient Medications:  .  apixaban (ELIQUIS) 2.5 MG TABS tablet, Take 1 tablet (2.5 mg total) by mouth every 12 (twelve) hours., Disp: 180 tablet, Rfl: 3 .  atorvastatin (LIPITOR) 80 MG tablet, Take 1 tablet (80 mg total) by mouth daily at 6 PM., Disp: 90 tablet, Rfl: 3 .  carvedilol (COREG) 3.125 MG tablet, Take 1 tablet (3.125 mg total) by mouth 2 (two) times daily., Disp: 60 tablet, Rfl: 11 .  megestrol (MEGACE) 20 MG tablet, TAKE 1 TABLET BY MOUTH ONCE DAILY, Disp: 30 tablet, Rfl: 2 .  potassium chloride (KLOR-CON) 10 MEQ tablet, Take 1 tablet (10 mEq total) by mouth daily., Disp: 90 tablet, Rfl: 3 .  sacubitril-valsartan (ENTRESTO) 24-26 MG, Take 1 tablet by mouth 2 (two) times daily., Disp: 60 tablet, Rfl: 11 .  spironolactone (ALDACTONE) 25 MG tablet, Take 1 tablet (25 mg total) by mouth at bedtime., Disp: 90 tablet, Rfl: 3  Past Medical History: Past Medical History:  Diagnosis Date  . 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 Hepatitis B     Tobacco Use: Social History   Tobacco Use  Smoking Status Former Smoker  . Packs/day: 0.50  . Years: 40.00  . Pack years: 20.00  . Quit date: 66  . Years since quitting: 25.6  Smokeless Tobacco Never Used    Labs: Recent Chemical engineer    Labs for ITP Cardiac and Pulmonary Rehab Latest Ref Rng & Units 01/05/2020 01/06/2020 01/06/2020 01/07/2020 05/10/2020   Cholestrol 0 - 200 mg/dL - - - - 137   LDLCALC 0 - 99 mg/dL - - - - 76   HDL >39.00 mg/dL - - - - 48.60   Trlycerides 0 -  149 mg/dL - - - - 58.0   Hemoglobin A1c 4.8 - 5.6 % - - - - -   PHART 7.35 - 7.45 - - - - -   PCO2ART 32 - 48 mmHg - - - - -   HCO3 20.0 - 28.0 mmol/L - - - - -   TCO2 22 - 32 mmol/L - - - - -   ACIDBASEDEF 0.0 - 2.0 mmol/L - - - - -   O2SAT % 58.0 52.7 58.4 57.3 -       Exercise Target Goals: Exercise Program Goal: Individual exercise prescription set using results from initial 6 min walk test and THRR while considering  patient's activity barriers and safety.   Exercise Prescription Goal: Initial exercise prescription builds to 30-45 minutes a day of aerobic activity, 2-3 days per week.  Home exercise guidelines will be given to patient during program as part of exercise prescription that the participant will acknowledge.   Education: Aerobic Exercise & Resistance Training: - Gives group verbal and written instruction on the various components of exercise. Focuses on aerobic and resistive training programs and the benefits of this training and how to safely progress through these programs..   Education: Exercise & Equipment Safety: - Individual verbal instruction and demonstration of equipment use  and safety with use of the equipment.   Cardiac Rehab from 07/06/2020 in [facility] Cardiac and Pulmonary Rehab  Date 06/20/20  Educator AS  Instruction Review Code 1- Verbalizes Understanding      Education: Exercise Physiology & General Exercise Guidelines: - Group verbal and written instruction with models to review the exercise physiology of the cardiovascular system and associated critical values. Provides general exercise guidelines with specific guidelines to those with heart or lung disease.    Education: Flexibility, Balance, Mind/Body Relaxation: Provides group verbal/written instruction on the benefits of flexibility and balance training, including mind/body exercise modes such as yoga, pilates and tai chi.  Demonstration and skill practice provided.   Activity Barriers & Risk  Stratification:   6 Minute Walk:  6 Minute Walk    Row Name 06/20/20 1039         6 Minute Walk   Phase Initial     Distance 700 feet     Walk Time 6 minutes     # of Rest Breaks 0     MPH 1.32     METS 1.6     RPE 11     Perceived Dyspnea  1     VO2 Peak 5.7     Symptoms No     Resting HR 92 bpm     Resting BP 98/56     Resting Oxygen Saturation  100 %     Exercise Oxygen Saturation  during 6 min walk 98 %     Max Ex. HR 109 bpm     Max Ex. BP 104/58     2 Minute Post BP 102/56            Oxygen Initial Assessment:   Oxygen Re-Evaluation:   Oxygen Discharge (Final Oxygen Re-Evaluation):   Initial Exercise Prescription:  Initial Exercise Prescription - 06/20/20 1000      Date of Initial Exercise RX and Referring Provider   Date 06/20/20    Referring Provider Aundra Dubin      Treadmill   MPH 1    Grade 0    Minutes 15    METs 1.77      Recumbant Bike   Level 1    RPM 60    Minutes 15    METs 1.6      NuStep   Level 1    SPM 80    Minutes 15    METs 1.6      REL-XR   Level 1    Speed 50    Minutes 15    METs 1.6      Prescription Details   Frequency (times per week) 3    Duration Progress to 30 minutes of continuous aerobic without signs/symptoms of physical distress      Intensity   THRR 40-80% of Max Heartrate 110-130    Ratings of Perceived Exertion 11-13    Perceived Dyspnea 0-4           Perform Capillary Blood Glucose checks as needed.  Exercise Prescription Changes:  Exercise Prescription Changes    Row Name 06/20/20 1000 07/06/20 1300           Response to Exercise   Blood Pressure (Admit) 98/56 122/60      Blood Pressure (Exercise) 104/58 118/66      Blood Pressure (Exit) 102/56 116/58      Heart Rate (Admit) 92 bpm 92 bpm      Heart Rate (Exercise) 109 bpm 103 bpm  Heart Rate (Exit) 99 bpm 90 bpm      Oxygen Saturation (Admit) 100 % --      Oxygen Saturation (Exercise) 98 % --      Rating of Perceived  Exertion (Exercise) 11 12      Perceived Dyspnea (Exercise) 1 --      Symptoms none none      Duration -- Progress to 30 minutes of  aerobic without signs/symptoms of physical distress      Intensity -- THRR unchanged        Progression   Progression -- Continue to progress workloads to maintain intensity without signs/symptoms of physical distress.      Average METs -- 1.5        Resistance Training   Weight -- 5 lb      Reps -- 10-15        Interval Training   Interval Training -- No        Recumbant Bike   Level -- 3      Minutes -- 15      METs -- 2.5        NuStep   Level -- 1      SPM -- 80      Minutes -- 15      METs -- 1.1             Exercise Comments:   Exercise Goals and Review:  Exercise Goals    Row Name 06/20/20 1047             Exercise Goals   Increase Physical Activity Yes       Intervention Provide advice, education, support and counseling about physical activity/exercise needs.;Develop an individualized exercise prescription for aerobic and resistive training based on initial evaluation findings, risk stratification, comorbidities and participant's personal goals.       Expected Outcomes Short Term: Attend rehab on a regular basis to increase amount of physical activity.;Long Term: Add in home exercise to make exercise part of routine and to increase amount of physical activity.;Long Term: Exercising regularly at least 3-5 days a week.       Increase Strength and Stamina Yes       Intervention Provide advice, education, support and counseling about physical activity/exercise needs.;Develop an individualized exercise prescription for aerobic and resistive training based on initial evaluation findings, risk stratification, comorbidities and participant's personal goals.       Expected Outcomes Short Term: Increase workloads from initial exercise prescription for resistance, speed, and METs.;Short Term: Perform resistance training exercises routinely  during rehab and add in resistance training at home;Long Term: Improve cardiorespiratory fitness, muscular endurance and strength as measured by increased METs and functional capacity (6MWT)       Able to understand and use rate of perceived exertion (RPE) scale Yes       Intervention Provide education and explanation on how to use RPE scale       Expected Outcomes Short Term: Able to use RPE daily in rehab to express subjective intensity level;Long Term:  Able to use RPE to guide intensity level when exercising independently       Knowledge and understanding of Target Heart Rate Range (THRR) Yes       Intervention Provide education and explanation of THRR including how the numbers were predicted and where they are located for reference       Expected Outcomes Short Term: Able to state/look up THRR;Short Term: Able to use daily as guideline  for intensity in rehab;Long Term: Able to use THRR to govern intensity when exercising independently       Able to check pulse independently Yes       Intervention Provide education and demonstration on how to check pulse in carotid and radial arteries.;Review the importance of being able to check your own pulse for safety during independent exercise       Expected Outcomes Short Term: Able to explain why pulse checking is important during independent exercise;Long Term: Able to check pulse independently and accurately       Understanding of Exercise Prescription Yes       Intervention Provide education, explanation, and written materials on patient's individual exercise prescription       Expected Outcomes Short Term: Able to explain program exercise prescription;Long Term: Able to explain home exercise prescription to exercise independently              Exercise Goals Re-Evaluation :  Exercise Goals Re-Evaluation    Row Name 06/22/20 3545 07/06/20 1357           Exercise Goal Re-Evaluation   Exercise Goals Review Increase Physical Activity;Able to  understand and use rate of perceived exertion (RPE) scale;Knowledge and understanding of Target Heart Rate Range (THRR);Understanding of Exercise Prescription;Increase Strength and Stamina;Able to check pulse independently Increase Physical Activity;Able to understand and use rate of perceived exertion (RPE) scale;Knowledge and understanding of Target Heart Rate Range (THRR);Understanding of Exercise Prescription;Increase Strength and Stamina;Able to check pulse independently      Comments Reviewed RPE and dyspnea scales, THR and program prescription with pt today.  Pt voiced understanding and was given a copy of goals to take home. Al is tolerating exercise well.  Staff is working with him on being comfortable on the recumbent bike.      Expected Outcomes Short: Use RPE daily to regulate intensity. Long: Follow program prescription in THR. Short:  continue to attend consistently Long:  build overall strength and stamina             Discharge Exercise Prescription (Final Exercise Prescription Changes):  Exercise Prescription Changes - 07/06/20 1300      Response to Exercise   Blood Pressure (Admit) 122/60    Blood Pressure (Exercise) 118/66    Blood Pressure (Exit) 116/58    Heart Rate (Admit) 92 bpm    Heart Rate (Exercise) 103 bpm    Heart Rate (Exit) 90 bpm    Rating of Perceived Exertion (Exercise) 12    Symptoms none    Duration Progress to 30 minutes of  aerobic without signs/symptoms of physical distress    Intensity THRR unchanged      Progression   Progression Continue to progress workloads to maintain intensity without signs/symptoms of physical distress.    Average METs 1.5      Resistance Training   Weight 5 lb    Reps 10-15      Interval Training   Interval Training No      Recumbant Bike   Level 3    Minutes 15    METs 2.5      NuStep   Level 1    SPM 80    Minutes 15    METs 1.1           Nutrition:  Target Goals: Understanding of nutrition guidelines,  daily intake of sodium <1576m, cholesterol <2039m calories 30% from fat and 7% or less from saturated fats, daily to have 5 or more  servings of fruits and vegetables.  Education: Controlling Sodium/Reading Food Labels -Group verbal and written material supporting the discussion of sodium use in heart healthy nutrition. Review and explanation with models, verbal and written materials for utilization of the food label.   Education: General Nutrition Guidelines/Fats and Fiber: -Group instruction provided by verbal, written material, models and posters to present the general guidelines for heart healthy nutrition. Gives an explanation and review of dietary fats and fiber.   Biometrics:    Nutrition Therapy Plan and Nutrition Goals:  Nutrition Therapy & Goals - 07/04/20 1019      Nutrition Therapy   Diet Heart healthy, low sodium    Protein (specify units) 50g    Fiber 30 grams    Whole Grain Foods 3 servings    Saturated Fats 12 max. grams    Fruits and Vegetables 5 servings/day    Sodium 1.5 grams      Personal Nutrition Goals   Nutrition Goal ST: try easy to prep meals disucssed, add peanut butter to snack LT: Gain muscle    Comments Lifts 8 lb handweights at home. eats more often and says it helps more with digestion and helps hime eat more. B: soft boiled eggs (2) with whole wheat toast or special K protein cereal with skim milk - will have coffee (sweet and low) or juice. Puts protein powder in skim milk or juice. He doesn't drink whole milk because it makes him congested. He eats soft foods because he has a hard time chewing. S: fruit (blueberries or banana) L: chicken wings and macaroni salad. D:hamburger from [employer]. He doesn't cook and his wife has passed. Discussed heart healthy eating and easy low prep meals such as rotisserie chicken (with vegetables and grains/potato, with pasta, in soup with low sodium brtoh and frozen veggies, chicken salad), pasta or bean salad using canned  or pre-cut vegetables, or pasta with frozen broccoli, jarred sauce, and beans or chicken.      Intervention Plan   Intervention Prescribe, educate and counsel regarding individualized specific dietary modifications aiming towards targeted core components such as weight, hypertension, lipid management, diabetes, heart failure and other comorbidities.;Nutrition handout(s) given to patient.    Expected Outcomes Short Term Goal: Understand basic principles of dietary content, such as calories, fat, sodium, cholesterol and nutrients.;Short Term Goal: A plan has been developed with personal nutrition goals set during dietitian appointment.;Long Term Goal: Adherence to prescribed nutrition plan.           Nutrition Assessments:  Nutrition Assessments - 06/20/20 1058      MEDFICTS Scores   Pre Score 54           MEDIFICTS Score Key:          ?70 Need to make dietary changes          40-70 Heart Healthy Diet         ? 40 Therapeutic Level Cholesterol Diet  Nutrition Goals Re-Evaluation:   Nutrition Goals Discharge (Final Nutrition Goals Re-Evaluation):   Psychosocial: Target Goals: Acknowledge presence or absence of significant depression and/or stress, maximize coping skills, provide positive support system. Participant is able to verbalize types and ability to use techniques and skills needed for reducing stress and depression.   Education: Depression - Provides group verbal and written instruction on the correlation between heart/lung disease and depressed mood, treatment options, and the stigmas associated with seeking treatment.   Cardiac Rehab from 07/06/2020 in [facility] Cardiac and Pulmonary Rehab  Date  07/06/20  Educator SB  Instruction Review Code 1- [employer] Understanding      Education: Sleep Hygiene -Provides group verbal and written instruction about how sleep can affect your health.  Define sleep hygiene, discuss sleep cycles and impact of sleep habits. Review good sleep  hygiene tips.     Education: Stress and Anxiety: - Provides group verbal and written instruction about the health risks of elevated stress and causes of high stress.  Discuss the correlation between heart/lung disease and anxiety and treatment options. Review healthy ways to manage with stress and anxiety.    Initial Review & Psychosocial Screening:  Initial Psych Review & Screening - 06/14/20 0942      Initial Review   Current issues with None Identified    Source of Stress Concerns None Identified      Family Dynamics   Good Support System? Yes    Comments He look to his daughter for support.      Barriers   Psychosocial barriers to participate in program The patient should benefit from training in stress management and relaxation.;There are no identifiable barriers or psychosocial needs.      Screening Interventions   Interventions Provide feedback about the scores to participant;Encouraged to exercise;To provide support and resources with identified psychosocial needs    Expected Outcomes Short Term goal: Utilizing psychosocial counselor, staff and physician to assist with identification of specific Stressors or current issues interfering with healing process. Setting desired goal for each stressor or current issue identified.;Long Term Goal: Stressors or current issues are controlled or eliminated.;Short Term goal: Identification and review with participant of any Quality of Life or Depression concerns found by scoring the questionnaire.;Long Term goal: The participant improves quality of Life and PHQ9 Scores as seen by post scores and/or verbalization of changes           Quality of Life Scores:   Quality of Life - 06/20/20 1056      Quality of Life   Select Quality of Life      Quality of Life Scores   Health/Function Pre 23.5 %    Socioeconomic Pre 27.5 %    Psych/Spiritual Pre 26.79 %    Family Pre 29.38 %    GLOBAL Pre 25.81 %          Scores of 19 and below  usually indicate a poorer quality of life in these areas.  A difference of  2-3 points is a clinically meaningful difference.  A difference of 2-3 points in the total score of the Quality of Life Index has been associated with significant improvement in overall quality of life, self-image, physical symptoms, and general health in studies assessing change in quality of life.  PHQ-9: Recent Review Flowsheet Data    Depression screen [facility] 2/9 06/29/2020 06/20/2020 05/10/2020   Decreased Interest 0 0 0   Down, Depressed, Hopeless 0 0 0   PHQ - 2 Score 0 0 0   Altered sleeping 0 0 0   Tired, decreased energy 0 0 0   Change in appetite 0 0 0   Feeling bad or failure about yourself  0 0 0   Trouble concentrating 0 0 0   Moving slowly or fidgety/restless 0 0 0   Suicidal thoughts 0 0 0   PHQ-9 Score 0 0 0   Difficult doing work/chores - - Not difficult at all     Interpretation of Total Score  Total Score Depression Severity:  1-4 = Minimal depression,  5-9 = Mild depression, 10-14 = Moderate depression, 15-19 = Moderately severe depression, 20-27 = Severe depression   Psychosocial Evaluation and Intervention:  Psychosocial Evaluation - 06/14/20 0946      Psychosocial Evaluation & Interventions   Interventions Encouraged to exercise with the program and follow exercise prescription    Comments Patiet states that he has no mental issues at this time.    Expected Outcomes Short: Exercise regularly to support mental health and notify staff of any changes. Long: maintain mental health and well being through teaching of rehab or prescribed medications independently.    Continue Psychosocial Services  Follow up required by staff           Psychosocial Re-Evaluation:   Psychosocial Discharge (Final Psychosocial Re-Evaluation):   Vocational Rehabilitation: Provide vocational rehab assistance to qualifying candidates.   Vocational Rehab Evaluation & Intervention:   Education: Education  Goals: Education classes will be provided on a variety of topics geared toward better understanding of heart health and risk factor modification. Participant will state understanding/return demonstration of topics presented as noted by education test scores.  Learning Barriers/Preferences:  Learning Barriers/Preferences - 06/14/20 0936      Learning Barriers/Preferences   Learning Barriers None    Learning Preferences None           General Cardiac Education Topics:  AED/CPR: - Group verbal and written instruction with the use of models to demonstrate the basic use of the AED with the basic ABC's of resuscitation.   Anatomy & Physiology of the Heart: - Group verbal and written instruction and models provide basic cardiac anatomy and physiology, with the coronary electrical and arterial systems. Review of Valvular disease and Heart Failure   Cardiac Procedures: - Group verbal and written instruction to review commonly prescribed medications for heart disease. Reviews the medication, class of the drug, and side effects. Includes the steps to properly store meds and maintain the prescription regimen. (beta blockers and nitrates)   Cardiac Medications I: - Group verbal and written instruction to review commonly prescribed medications for heart disease. Reviews the medication, class of the drug, and side effects. Includes the steps to properly store meds and maintain the prescription regimen.   Cardiac Medications II: -Group verbal and written instruction to review commonly prescribed medications for heart disease. Reviews the medication, class of the drug, and side effects. (all other drug classes)    Go Sex-Intimacy & Heart Disease, Get SMART - Goal Setting: - Group verbal and written instruction through game format to discuss heart disease and the return to sexual intimacy. Provides group verbal and written material to discuss and apply goal setting through the application of the  S.M.A.R.T. Method.   Other Matters of the Heart: - Provides group verbal, written materials and models to describe Stable Angina and Peripheral Artery. Includes description of the disease process and treatment options available to the cardiac patient.   Infection Prevention: - Provides verbal and written material to individual with discussion of infection control including proper hand washing and proper equipment cleaning during exercise session.   Cardiac Rehab from 07/06/2020 in [facility] Cardiac and Pulmonary Rehab  Date 06/20/20  Educator AS  Instruction Review Code 1- Verbalizes Understanding      Falls Prevention: - Provides verbal and written material to individual with discussion of falls prevention and safety.   Cardiac Rehab from 07/06/2020 in [facility] Cardiac and Pulmonary Rehab  Date 06/20/20  Educator AS  Instruction Review Code 1- Verbalizes Understanding  Other: -Provides group and verbal instruction on various topics (see comments)   Knowledge Questionnaire Score:   Core Components/Risk Factors/Patient Goals at Admission:  Personal Goals and Risk Factors at Admission - 06/20/20 1055      Core Components/Risk Factors/Patient Goals on Admission    Weight Management Yes;Weight Gain    Intervention Weight Management: Develop a combined nutrition and exercise program designed to reach desired caloric intake, while maintaining appropriate intake of nutrient and fiber, sodium and fats, and appropriate energy expenditure required for the weight goal.;Weight Management: Provide education and appropriate resources to help participant work on and attain dietary goals.;Weight Management/Obesity: Establish reasonable short term and long term weight goals.    Admit Weight 113 lb 6.4 oz (51.4 kg)    Goal Weight: Short Term 120 lb (54.4 kg)    Goal Weight: Long Term 125 lb (56.7 kg)    Expected Outcomes Short Term: Continue to assess and modify interventions until short term weight is  achieved;Long Term: Adherence to nutrition and physical activity/exercise program aimed toward attainment of established weight goal;Weight Maintenance: Understanding of the daily nutrition guidelines, which includes 25-35% calories from fat, 7% or less cal from saturated fats, less than 216m cholesterol, less than 1.5gm of sodium, & 5 or more servings of fruits and vegetables daily;Understanding recommendations for meals to include 15-35% energy as protein, 25-35% energy from fat, 35-60% energy from carbohydrates, less than 2042mof dietary cholesterol, 20-35 gm of total fiber daily;Understanding of distribution of calorie intake throughout the day with the consumption of 4-5 meals/snacks;Weight Gain: Understanding of general recommendations for a high calorie, high protein meal plan that promotes weight gain by distributing calorie intake throughout the day with the consumption for 4-5 meals, snacks, and/or supplements    Intervention Provide a combined exercise and nutrition program that is supplemented with education, support and counseling about heart failure. Directed toward relieving symptoms such as shortness of breath, decreased exercise tolerance, and extremity edema.    Expected Outcomes Improve functional capacity of life;Short term: Daily weights obtained and reported for increase. Utilizing diuretic protocols set by physician.;Short term: Attendance in program 2-3 days a week with increased exercise capacity. Reported lower sodium intake. Reported increased fruit and vegetable intake. Reports medication compliance.;Long term: Adoption of self-care skills and reduction of barriers for early signs and symptoms recognition and intervention leading to self-care maintenance.    Intervention Provide education and support for participant on nutrition & aerobic/resistive exercise along with prescribed medications to achieve LDL <7067mHDL >48m20m  Expected Outcomes Short Term: Participant states  understanding of desired cholesterol values and is compliant with medications prescribed. Participant is following exercise prescription and nutrition guidelines.;Long Term: Cholesterol controlled with medications as prescribed, with individualized exercise RX and with personalized nutrition plan. Value goals: LDL < 70mg29mL > 40 mg.           Education:Diabetes - Individual verbal and written instruction to review signs/symptoms of diabetes, desired ranges of glucose level fasting, after meals and with exercise. Acknowledge that pre and post exercise glucose checks will be done for 3 sessions at entry of program.   Education: Know Your Numbers and Risk Factors: -Group verbal and written instruction about important numbers in your health.  Discussion of what are risk factors and how they play a role in the disease process.  Review of Cholesterol, Blood Pressure, Diabetes, and BMI and the role they play in your overall health.   Core Components/Risk Factors/Patient Goals Review:  Core Components/Risk Factors/Patient Goals at Discharge (Final Review):    ITP Comments:  ITP Comments    Row Name 06/14/20 0949 06/20/20 1102 06/22/20 0938 07/13/20 0622     ITP Comments Virtual Visit completed. Patient informed on EP and RD appointment and 6 Minute walk test. Patient also informed of patient health questionnaires on My Chart. Patient Verbalizes understanding. Visit diagnosis can be found in [facility] 12/31/2019. Completed 6MWT and gym orientation. Initial ITP created and sent for review to Dr. Emily Filbert, Medical Director. First full day of exercise!  Patient was oriented to gym and equipment including functions, settings, policies, and procedures.  Patient's individual exercise prescription and treatment plan were reviewed.  All starting workloads were established based on the results of the 6 minute walk test done at initial orientation visit.  The plan for exercise progression was also introduced  and progression will be customized based on patient's performance and goals 30 Day review completed. Medical Director ITP review done, changes made as directed, and signed approval by Medical Director.           Comments:

## 2020-07-13 NOTE — Progress Notes (Signed)
Daily Session Note  Patient Details  Name: David Perez MRN: [id number] Date of Birth: 1941/03/24 Referring Provider:     Cardiac Rehab from 06/20/2020 in [facility] Cardiac and Pulmonary Rehab  Referring Provider Aundra Dubin      Encounter Date: 07/13/2020  Check In:  Session Check In - 07/13/20 0930      Check-In   Supervising physician immediately available to respond to emergencies See telemetry face sheet for immediately available ER MD    Location [facility]-Cardiac & Pulmonary Rehab    Staff Present Renita Papa, RN BSN;Joseph [address] [place], [place], [place], CCRP, [place], [place], ACSM CEP, Exercise Physiologist    Virtual Visit No    Medication changes reported     No    Fall or balance concerns reported    No    Warm-up and Cool-down Performed on first and last piece of equipment    Resistance Training Performed Yes    VAD Patient? No    PAD/SET Patient? No      Pain Assessment   Currently in Pain? No/denies              Social History   Tobacco Use  Smoking Status Former Smoker  . Packs/day: 0.50  . Years: 40.00  . Pack years: 20.00  . Quit date: 8  . Years since quitting: 25.6  Smokeless Tobacco Never Used    Goals Met:  Independence with exercise equipment Exercise tolerated well No report of cardiac concerns or symptoms Strength training completed today  Goals Unmet:  Not Applicable  Comments: Pt able to follow exercise prescription today without complaint.  Will continue to monitor for progression.    Dr. Emily Filbert is Medical Director for [place] and [name].

## 2020-07-15 ENCOUNTER — Other Ambulatory Visit: Payer: Self-pay

## 2020-07-15 ENCOUNTER — Encounter: Payer: Medicare Other | Admitting: *Deleted

## 2020-07-15 DIAGNOSIS — I252 Old myocardial infarction: Secondary | ICD-10-CM | POA: Diagnosis not present

## 2020-07-15 DIAGNOSIS — I213 ST elevation (STEMI) myocardial infarction of unspecified site: Secondary | ICD-10-CM

## 2020-07-15 DIAGNOSIS — Z951 Presence of aortocoronary bypass graft: Secondary | ICD-10-CM

## 2020-07-15 NOTE — Progress Notes (Signed)
Daily Session Note  Patient Details  Name: Josejulian Tarango MRN: [id number] Date of Birth: 1941/08/23 Referring Provider:     Cardiac Rehab from 06/20/2020 in [facility] Cardiac and Pulmonary Rehab  Referring Provider Aundra Dubin      Encounter Date: 07/15/2020  Check In:  Session Check In - 07/15/20 0928      Check-In   Supervising physician immediately available to respond to emergencies See telemetry face sheet for immediately available ER MD    Location [facility]-Cardiac & Pulmonary Rehab    Staff Present Renita Papa, RN BSN;Joseph [address] [place], [place], [place], CCRP, CCET    Virtual Visit No    Medication changes reported     No    Fall or balance concerns reported    No    Warm-up and Cool-down Performed on first and last piece of equipment    Resistance Training Performed Yes    VAD Patient? No    PAD/SET Patient? No      Pain Assessment   Currently in Pain? No/denies              Social History   Tobacco Use  Smoking Status Former Smoker  . Packs/day: 0.50  . Years: 40.00  . Pack years: 20.00  . Quit date: 28  . Years since quitting: 25.6  Smokeless Tobacco Never Used    Goals Met:  Independence with exercise equipment Exercise tolerated well No report of cardiac concerns or symptoms Strength training completed today  Goals Unmet:  Not Applicable  Comments: Pt able to follow exercise prescription today without complaint.  Will continue to monitor for progression.    Dr. Emily Filbert is Medical Director for [place] and [name].

## 2020-07-18 ENCOUNTER — Other Ambulatory Visit: Payer: Self-pay

## 2020-07-18 ENCOUNTER — Encounter: Payer: Medicare Other | Admitting: *Deleted

## 2020-07-18 DIAGNOSIS — I252 Old myocardial infarction: Secondary | ICD-10-CM | POA: Diagnosis not present

## 2020-07-18 DIAGNOSIS — Z951 Presence of aortocoronary bypass graft: Secondary | ICD-10-CM

## 2020-07-18 NOTE — Progress Notes (Signed)
Daily Session Note  Patient Details  Name: David Perez MRN: [id number] Date of Birth: March 14, 1941 Referring Provider:     Cardiac Rehab from 06/20/2020 in [facility] Cardiac and Pulmonary Rehab  Referring Provider Aundra Dubin      Encounter Date: 07/18/2020  Check In:  Session Check In - 07/18/20 1036      Check-In   Supervising physician immediately available to respond to emergencies See telemetry face sheet for immediately available ER MD    Location [facility]-Cardiac & Pulmonary Rehab    Staff Present Heath Lark, RN, BSN, Laveda Norman, BS, ACSM CEP, Exercise Physiologist;Joseph Tessie Fass RCP,RRT,BSRT    Virtual Visit No    Medication changes reported     No    Fall or balance concerns reported    No    Warm-up and Cool-down Performed on first and last piece of equipment    Resistance Training Performed Yes    VAD Patient? No    PAD/SET Patient? No      Pain Assessment   Currently in Pain? No/denies              Social History   Tobacco Use  Smoking Status Former Smoker  . Packs/day: 0.50  . Years: 40.00  . Pack years: 20.00  . Quit date: 14  . Years since quitting: 25.6  Smokeless Tobacco Never Used    Goals Met:  Independence with exercise equipment Personal goals reviewed No report of cardiac concerns or symptoms  Goals Unmet:  Not Applicable  Comments: Pt able to follow exercise prescription today without complaint.  Will continue to monitor for progression.    Dr. Emily Filbert is Medical Director for [place] and [name].

## 2020-07-18 NOTE — Progress Notes (Signed)
Daily Session Note  Patient Details  Name: David Perez MRN: [id number] Date of Birth: [birth year] Referring Provider:     Cardiac Rehab from 06/20/2020 in [facility] Cardiac and Pulmonary Rehab  Referring Provider Aundra Dubin      Encounter Date: 07/18/2020  Check In:      Social History   Tobacco Use  Smoking Status Former Smoker  . Packs/day: 0.50  . Years: 40.00  . Pack years: 20.00  . Quit date: 65  . Years since quitting: 25.6  Smokeless Tobacco Never Used    Goals Met:  Independence with exercise equipment Exercise tolerated well Personal goals reviewed No report of cardiac concerns or symptoms Strength training completed today  Goals Unmet:  Not Applicable  Comments: Reviewed home exercise with pt today.  Pt plans to walk and use weights for exercise.  Reviewed THR, pulse, RPE, sign and symptoms, pulse oximetery and when to call 911 or MD.  Also discussed weather considerations and indoor options.  Pt voiced understanding.    Dr. Emily Filbert is Medical Director for [place] and [name].

## 2020-07-20 ENCOUNTER — Encounter: Payer: Medicare Other | Admitting: *Deleted

## 2020-07-20 ENCOUNTER — Other Ambulatory Visit: Payer: Self-pay

## 2020-07-20 DIAGNOSIS — Z951 Presence of aortocoronary bypass graft: Secondary | ICD-10-CM

## 2020-07-20 DIAGNOSIS — I252 Old myocardial infarction: Secondary | ICD-10-CM | POA: Diagnosis not present

## 2020-07-20 NOTE — Progress Notes (Signed)
Daily Session Note  Patient Details  Name: Jayvin Hurrell MRN: [id number] Date of Birth: 1941/03/24 Referring Provider:     Cardiac Rehab from 06/20/2020 in [facility] Cardiac and Pulmonary Rehab  Referring Provider Aundra Dubin      Encounter Date: 07/20/2020  Check In:  Session Check In - 07/20/20 1007      Check-In   Supervising physician immediately available to respond to emergencies See telemetry face sheet for immediately available ER MD    Location [facility]-Cardiac & Pulmonary Rehab    Staff Present Heath Lark, RN, BSN, CCRP;Joseph Hood RCP,RRT,BSRT;Jessica [place], [place], [place], CCRP, [place], [place], ACSM CEP, Exercise Physiologist    Virtual Visit No    Medication changes reported     No    Fall or balance concerns reported    No    Warm-up and Cool-down Performed on first and last piece of equipment    Resistance Training Performed Yes    VAD Patient? No    PAD/SET Patient? No      Pain Assessment   Currently in Pain? No/denies              Social History   Tobacco Use  Smoking Status Former Smoker  . Packs/day: 0.50  . Years: 40.00  . Pack years: 20.00  . Quit date: 27  . Years since quitting: 25.6  Smokeless Tobacco Never Used    Goals Met:  Independence with exercise equipment Exercise tolerated well No report of cardiac concerns or symptoms  Goals Unmet:  Not Applicable  Comments: Pt able to follow exercise prescription today without complaint.  Will continue to monitor for progression.    Dr. Emily Filbert is Medical Director for [place] and [name].

## 2020-07-22 ENCOUNTER — Other Ambulatory Visit: Payer: Self-pay

## 2020-07-22 ENCOUNTER — Encounter: Payer: Medicare Other | Admitting: *Deleted

## 2020-07-22 DIAGNOSIS — I252 Old myocardial infarction: Secondary | ICD-10-CM | POA: Diagnosis not present

## 2020-07-22 DIAGNOSIS — Z951 Presence of aortocoronary bypass graft: Secondary | ICD-10-CM

## 2020-07-22 NOTE — Progress Notes (Signed)
Daily Session Note  Patient Details  Name: Dariush Mcnellis MRN: [id number] Date of Birth: 10-10-1941 Referring Provider:     Cardiac Rehab from 06/20/2020 in [facility] Cardiac and Pulmonary Rehab  Referring Provider Aundra Dubin      Encounter Date: 07/22/2020  Check In:  Session Check In - 07/22/20 1051      Check-In   Supervising physician immediately available to respond to emergencies See telemetry face sheet for immediately available ER MD    Location [facility]-Cardiac & Pulmonary Rehab    Staff Present Heath Lark, RN, BSN, CCRP;Jessica Williams, MA, RCEP, CCRP, CCET;Joseph [employer], [place], ACSM CEP, Exercise Physiologist    Virtual Visit No    Medication changes reported     No    Fall or balance concerns reported    No    Warm-up and Cool-down Performed on first and last piece of equipment    Resistance Training Performed Yes    VAD Patient? No    PAD/SET Patient? No      Pain Assessment   Currently in Pain? No/denies              Social History   Tobacco Use  Smoking Status Former Smoker  . Packs/day: 0.50  . Years: 40.00  . Pack years: 20.00  . Quit date: 80  . Years since quitting: 25.6  Smokeless Tobacco Never Used    Goals Met:  Independence with exercise equipment Exercise tolerated well No report of cardiac concerns or symptoms  Goals Unmet:  Not Applicable  Comments: Pt able to follow exercise prescription today without complaint.  Will continue to monitor for progression.    Dr. Emily Filbert is Medical Director for [place] and [name].

## 2020-07-25 ENCOUNTER — Other Ambulatory Visit: Payer: Self-pay

## 2020-07-25 ENCOUNTER — Encounter: Payer: Medicare Other | Admitting: *Deleted

## 2020-07-25 DIAGNOSIS — I252 Old myocardial infarction: Secondary | ICD-10-CM | POA: Diagnosis not present

## 2020-07-25 DIAGNOSIS — Z951 Presence of aortocoronary bypass graft: Secondary | ICD-10-CM

## 2020-07-25 NOTE — Progress Notes (Signed)
Daily Session Note  Patient Details  Name: David Perez MRN: [id number] Date of Birth: 02-18-1941 Referring Provider:     Cardiac Rehab from 06/20/2020 in [facility] Cardiac and Pulmonary Rehab  Referring Provider David Perez      Encounter Date: 07/25/2020  Check In:  Session Check In - 07/25/20 1004      Check-In   Supervising physician immediately available to respond to emergencies See telemetry face sheet for immediately available ER MD    Location [facility]-Cardiac & Pulmonary Rehab    Staff Present David Lark, RN, BSN, David Perez, BS, ACSM CEP, Exercise Physiologist;David Perez, [place], RCEP, CCRP, CCET    Virtual Visit No    Medication changes reported     No    Fall or balance concerns reported    No    Warm-up and Cool-down Performed on first and last piece of equipment    Resistance Training Performed Yes    VAD Patient? No    PAD/SET Patient? No      Pain Assessment   Currently in Pain? No/denies              Social History   Tobacco Use  Smoking Status Former Smoker  . Packs/day: 0.50  . Years: 40.00  . Pack years: 20.00  . Quit date: 3  . Years since quitting: 25.6  Smokeless Tobacco Never Used    Goals Met:  Independence with exercise equipment Personal goals reviewed No report of cardiac concerns or symptoms  Goals Unmet:  Not Applicable  Comments: Pt able to follow exercise prescription today without complaint.  Will continue to monitor for progression.  [name] is concerned that today his breathing is harder than ususal. He has no weight gain noted. Med changes occured early this month He was advised totalk to his physician about the concern. He verbalized understanding that he should call his physician.  Dr. Emily Perez is Medical Director for [place] and [name].

## 2020-07-27 ENCOUNTER — Encounter: Payer: Medicare Other | Admitting: *Deleted

## 2020-07-27 ENCOUNTER — Other Ambulatory Visit: Payer: Self-pay

## 2020-07-27 DIAGNOSIS — I252 Old myocardial infarction: Secondary | ICD-10-CM | POA: Diagnosis not present

## 2020-07-27 DIAGNOSIS — Z951 Presence of aortocoronary bypass graft: Secondary | ICD-10-CM

## 2020-07-27 NOTE — Progress Notes (Signed)
Daily Session Note  Patient Details  Name: David Perez MRN: [id number] Date of Birth: November 15, 1941 Referring Provider:     Cardiac Rehab from 06/20/2020 in [facility] Cardiac and Pulmonary Rehab  Referring Provider Aundra Dubin      Encounter Date: 07/27/2020  Check In:  Session Check In - 07/27/20 0915      Check-In   Supervising physician immediately available to respond to emergencies See telemetry face sheet for immediately available ER MD    Location [facility]-Cardiac & Pulmonary Rehab    Staff Present Alberteen Sam, MA, RCEP, CCRP, Marylynn Pearson, MS Exercise Physiologist;Amanda Oletta Darter, BA, ACSM CEP, Exercise Physiologist;Susanne Bice, RN, BSN, CCRP    Virtual Visit No    Medication changes reported     No    Fall or balance concerns reported    No    Warm-up and Cool-down Performed on first and last piece of equipment    Resistance Training Performed Yes    VAD Patient? No    PAD/SET Patient? No      Pain Assessment   Currently in Pain? No/denies              Social History   Tobacco Use  Smoking Status Former Smoker  . Packs/day: 0.50  . Years: 40.00  . Pack years: 20.00  . Quit date: 64  . Years since quitting: 25.6  Smokeless Tobacco Never Used    Goals Met:  Independence with exercise equipment Exercise tolerated well No report of cardiac concerns or symptoms Strength training completed today  Goals Unmet:  Not Applicable  Comments: Pt able to follow exercise prescription today without complaint.  Will continue to monitor for progression.    Dr. Emily Filbert is Medical Director for [place] and [name].

## 2020-07-29 ENCOUNTER — Other Ambulatory Visit: Payer: Self-pay

## 2020-07-29 DIAGNOSIS — Z951 Presence of aortocoronary bypass graft: Secondary | ICD-10-CM

## 2020-07-29 DIAGNOSIS — I252 Old myocardial infarction: Secondary | ICD-10-CM | POA: Diagnosis not present

## 2020-07-29 NOTE — Progress Notes (Signed)
Daily Session Note  Patient Details  Name: Thomes Burak MRN: [id number] Date of Birth: 1941/07/03 Referring Provider:     Cardiac Rehab from 06/20/2020 in [facility] Cardiac and Pulmonary Rehab  Referring Provider Aundra Dubin      Encounter Date: 07/29/2020  Check In:  Session Check In - 07/29/20 0931      Check-In   Supervising physician immediately available to respond to emergencies See telemetry face sheet for immediately available ER MD    Location [facility]-Cardiac & Pulmonary Rehab    Staff Present Carson Myrtle, BS, RRT, CPFT;Vida Rigger RN, BSN;Jessica Luan Pulling, MA, RCEP, CCRP, CCET    Virtual Visit No    Medication changes reported     No    Fall or balance concerns reported    No    Warm-up and Cool-down Performed on first and last piece of equipment    Resistance Training Performed Yes    VAD Patient? No    PAD/SET Patient? No      Pain Assessment   Currently in Pain? No/denies              Social History   Tobacco Use  Smoking Status Former Smoker  . Packs/day: 0.50  . Years: 40.00  . Pack years: 20.00  . Quit date: 53  . Years since quitting: 25.6  Smokeless Tobacco Never Used    Goals Met:  Proper associated with RPD/PD & O2 Sat Independence with exercise equipment Exercise tolerated well No report of cardiac concerns or symptoms Strength training completed today  Goals Unmet:  Not Applicable  Comments: Pt able to follow exercise prescription today without complaint.  Will continue to monitor for progression.   Dr. Emily Filbert is Medical Director for [place] and [name].

## 2020-08-01 ENCOUNTER — Other Ambulatory Visit: Payer: Self-pay

## 2020-08-01 ENCOUNTER — Encounter: Payer: Medicare Other | Admitting: *Deleted

## 2020-08-01 DIAGNOSIS — Z951 Presence of aortocoronary bypass graft: Secondary | ICD-10-CM

## 2020-08-01 DIAGNOSIS — I252 Old myocardial infarction: Secondary | ICD-10-CM | POA: Diagnosis not present

## 2020-08-01 NOTE — Progress Notes (Signed)
Daily Session Note  Patient Details  Name: Jourdyn Ferrin MRN: [id number] Date of Birth: Jul 15, 1941 Referring Provider:     Cardiac Rehab from 06/20/2020 in [facility] Cardiac and Pulmonary Rehab  Referring Provider Aundra Dubin      Encounter Date: 08/01/2020  Check In:  Session Check In - 08/01/20 0940      Check-In   Supervising physician immediately available to respond to emergencies See telemetry face sheet for immediately available ER MD    Location [facility]-Cardiac & Pulmonary Rehab    Staff Present Heath Lark, RN, BSN, CCRP;Joseph Hood RCP,RRT,BSRT;Kelly [place], [place], ACSM CEP, Exercise Physiologist    Virtual Visit No    Medication changes reported     No    Fall or balance concerns reported    No    Warm-up and Cool-down Performed on first and last piece of equipment    Resistance Training Performed Yes    VAD Patient? No    PAD/SET Patient? No      Pain Assessment   Currently in Pain? No/denies              Social History   Tobacco Use  Smoking Status Former Smoker  . Packs/day: 0.50  . Years: 40.00  . Pack years: 20.00  . Quit date: 14  . Years since quitting: 25.6  Smokeless Tobacco Never Used    Goals Met:  Independence with exercise equipment Exercise tolerated well No report of cardiac concerns or symptoms  Goals Unmet:  Not Applicable  Comments: Pt able to follow exercise prescription today without complaint.  Will continue to monitor for progression.    Dr. Emily Filbert is Medical Director for [place] and [name].

## 2020-08-03 ENCOUNTER — Other Ambulatory Visit: Payer: Self-pay

## 2020-08-03 ENCOUNTER — Encounter: Payer: Medicare Other | Attending: Cardiology | Admitting: *Deleted

## 2020-08-03 DIAGNOSIS — I252 Old myocardial infarction: Secondary | ICD-10-CM | POA: Diagnosis not present

## 2020-08-03 DIAGNOSIS — Z951 Presence of aortocoronary bypass graft: Secondary | ICD-10-CM | POA: Diagnosis not present

## 2020-08-03 DIAGNOSIS — Z87891 Personal history of nicotine dependence: Secondary | ICD-10-CM | POA: Insufficient documentation

## 2020-08-03 DIAGNOSIS — Z952 Presence of prosthetic heart valve: Secondary | ICD-10-CM | POA: Diagnosis not present

## 2020-08-03 NOTE — Progress Notes (Signed)
Daily Session Note  Patient Details  Name: Theran Vandergrift MRN: [id number] Date of Birth: September 03, 1941 Referring Provider:     Cardiac Rehab from 06/20/2020 in [facility] Cardiac and Pulmonary Rehab  Referring Provider Aundra Dubin      Encounter Date: 08/03/2020  Check In:  Session Check In - 08/03/20 1036      Check-In   Supervising physician immediately available to respond to emergencies See telemetry face sheet for immediately available ER MD    Location [facility]Cardiac & Pulmonary Rehab    Staff Present Heath Lark, RN, BSN, CCRP;Jessica Lyons, MA, RCEP, CCRP, CCET;Joseph [employer], [place], ACSM CEP, Exercise Physiologist    Virtual Visit No    Medication changes reported     No    Fall or balance concerns reported    No    Warm-up and Cool-down Performed on first and last piece of equipment    Resistance Training Performed Yes    VAD Patient? No    PAD/SET Patient? No      Pain Assessment   Currently in Pain? No/denies              Social History   Tobacco Use  Smoking Status Former Smoker  . Packs/day: 0.50  . Years: 40.00  . Pack years: 20.00  . Quit date: 74  . Years since quitting: 25.6  Smokeless Tobacco Never Used    Goals Met:  Independence with exercise equipment Exercise tolerated well No report of cardiac concerns or symptoms  Goals Unmet:  Not Applicable  Comments: Pt able to follow exercise prescription today without complaint.  Will continue to monitor for progression.    Dr. Emily Filbert is Medical Director for [place] and [name].

## 2020-08-05 ENCOUNTER — Other Ambulatory Visit: Payer: Self-pay

## 2020-08-05 DIAGNOSIS — I213 ST elevation (STEMI) myocardial infarction of unspecified site: Secondary | ICD-10-CM

## 2020-08-05 DIAGNOSIS — I252 Old myocardial infarction: Secondary | ICD-10-CM | POA: Diagnosis not present

## 2020-08-05 DIAGNOSIS — Z951 Presence of aortocoronary bypass graft: Secondary | ICD-10-CM

## 2020-08-05 NOTE — Progress Notes (Signed)
Daily Session Note  Patient Details  Name: David Perez MRN: [id number] Date of Birth: May 11, 1941 Referring Provider:     Cardiac Rehab from 06/20/2020 in [facility] Cardiac and Pulmonary Rehab  Referring Provider Aundra Dubin      Encounter Date: 08/05/2020  Check In:  Session Check In - 08/05/20 0958      Check-In   Supervising physician immediately available to respond to emergencies See telemetry face sheet for immediately available ER MD    Location [facility]-Cardiac & Pulmonary Rehab    Staff Present Vida Rigger RN, BSN;Jessica Luan Pulling, MA, RCEP, CCRP, CCET;Melissa Caiola RDN, LDN    Virtual Visit No    Medication changes reported     No    Fall or balance concerns reported    No    Warm-up and Cool-down Performed on first and last piece of equipment    Resistance Training Performed Yes    VAD Patient? No    PAD/SET Patient? No      Pain Assessment   Currently in Pain? No/denies              Social History   Tobacco Use  Smoking Status Former Smoker  . Packs/day: 0.50  . Years: 40.00  . Pack years: 20.00  . Quit date: 20  . Years since quitting: 25.6  Smokeless Tobacco Never Used    Goals Met:  Proper associated with RPD/PD & O2 Sat Independence with exercise equipment Exercise tolerated well No report of cardiac concerns or symptoms Strength training completed today  Goals Unmet:  Not Applicable  Comments: Pt able to follow exercise prescription today without complaint.  Will continue to monitor for progression.   Dr. Emily Filbert is Medical Director for [place] and [name].

## 2020-08-10 ENCOUNTER — Ambulatory Visit: Payer: Medicare Other | Admitting: Nurse Practitioner

## 2020-08-10 ENCOUNTER — Other Ambulatory Visit: Payer: Self-pay

## 2020-08-10 ENCOUNTER — Encounter: Payer: Medicare Other | Admitting: *Deleted

## 2020-08-10 ENCOUNTER — Encounter: Payer: Self-pay | Admitting: *Deleted

## 2020-08-10 DIAGNOSIS — I252 Old myocardial infarction: Secondary | ICD-10-CM | POA: Diagnosis not present

## 2020-08-10 DIAGNOSIS — Z951 Presence of aortocoronary bypass graft: Secondary | ICD-10-CM

## 2020-08-10 NOTE — Progress Notes (Signed)
Daily Session Note  Patient Details  Name: David Perez MRN: [id number] Date of Birth: 03-08-1941 Referring Provider:     Cardiac Rehab from 06/20/2020 in [facility] Cardiac and Pulmonary Rehab  Referring Provider Aundra Dubin      Encounter Date: 08/10/2020  Check In:  Session Check In - 08/10/20 0940      Check-In   Supervising physician immediately available to respond to emergencies See telemetry face sheet for immediately available ER MD    Location [facility]-Cardiac & Pulmonary Rehab    Staff Present Renita Papa, RN BSN;Joseph Foy [place], [place], ACSM CEP, Exercise Physiologist;Melissa Caiola RDN, LDN    Virtual Visit No    Medication changes reported     No    Fall or balance concerns reported    No    Warm-up and Cool-down Performed on first and last piece of equipment    Resistance Training Performed Yes    VAD Patient? No    PAD/SET Patient? No      Pain Assessment   Currently in Pain? No/denies              Social History   Tobacco Use  Smoking Status Former Smoker  . Packs/day: 0.50  . Years: 40.00  . Pack years: 20.00  . Quit date: 58  . Years since quitting: 25.7  Smokeless Tobacco Never Used    Goals Met:  Independence with exercise equipment Exercise tolerated well No report of cardiac concerns or symptoms Strength training completed today  Goals Unmet:  Not Applicable  Comments: Pt able to follow exercise prescription today without complaint.  Will continue to monitor for progression.    Dr. Emily Filbert is Medical Director for [place] and [name].

## 2020-08-10 NOTE — Progress Notes (Signed)
Cardiac Individual Treatment Plan  Patient Details  Name: David Perez MRN: [id number] Date of Birth: 1940-12-26 Referring Provider:     Cardiac Rehab from 06/20/2020 in [facility] Cardiac and Pulmonary Rehab  Referring Provider David Perez      Initial Encounter Date:    Cardiac Rehab from 06/20/2020 in [facility] Cardiac and Pulmonary Rehab  Date 06/20/20      Visit Diagnosis: S/P CABG x 3  Patient's Home Medications on Admission:  Current Outpatient Medications:  .  apixaban (ELIQUIS) 2.5 MG TABS tablet, Take 1 tablet (2.5 mg total) by mouth every 12 (twelve) hours., Disp: 180 tablet, Rfl: 3 .  atorvastatin (LIPITOR) 80 MG tablet, Take 1 tablet (80 mg total) by mouth daily at 6 PM., Disp: 90 tablet, Rfl: 3 .  carvedilol (COREG) 3.125 MG tablet, Take 1 tablet (3.125 mg total) by mouth 2 (two) times daily., Disp: 60 tablet, Rfl: 11 .  megestrol (MEGACE) 20 MG tablet, TAKE 1 TABLET BY MOUTH ONCE DAILY, Disp: 30 tablet, Rfl: 2 .  potassium chloride (KLOR-CON) 10 MEQ tablet, Take 1 tablet (10 mEq total) by mouth daily., Disp: 90 tablet, Rfl: 3 .  sacubitril-valsartan (ENTRESTO) 24-26 MG, Take 1 tablet by mouth 2 (two) times daily., Disp: 60 tablet, Rfl: 11 .  spironolactone (ALDACTONE) 25 MG tablet, Take 1 tablet (25 mg total) by mouth at bedtime., Disp: 90 tablet, Rfl: 3  Past Medical History: Past Medical History:  Diagnosis Date  . 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 Hepatitis B     Tobacco Use: Social History   Tobacco Use  Smoking Status Former Smoker  . Packs/day: 0.50  . Years: 40.00  . Pack years: 20.00  . Quit date: 30  . Years since quitting: 25.7  Smokeless Tobacco Never Used    Labs: Recent Chemical engineer    Labs for ITP Cardiac and Pulmonary Rehab Latest Ref Rng & Units 01/05/2020 01/06/2020 01/06/2020 01/07/2020 05/10/2020   Cholestrol 0 - 200 mg/dL - - - - 137   LDLCALC 0 - 99 mg/dL - - - - 76   HDL >39.00 mg/dL - - - - 48.60   Trlycerides 0 -  149 mg/dL - - - - 58.0   Hemoglobin A1c 4.8 - 5.6 % - - - - -   PHART 7.35 - 7.45 - - - - -   PCO2ART 32 - 48 mmHg - - - - -   HCO3 20.0 - 28.0 mmol/L - - - - -   TCO2 22 - 32 mmol/L - - - - -   ACIDBASEDEF 0.0 - 2.0 mmol/L - - - - -   O2SAT % 58.0 52.7 58.4 57.3 -       Exercise Target Goals: Exercise Program Goal: Individual exercise prescription set using results from initial 6 min walk test and THRR while considering  patient's activity barriers and safety.   Exercise Prescription Goal: Initial exercise prescription builds to 30-45 minutes a day of aerobic activity, 2-3 days per week.  Home exercise guidelines will be given to patient during program as part of exercise prescription that the participant will acknowledge.   Education: Aerobic Exercise & Resistance Training: - Gives group verbal and written instruction on the various components of exercise. Focuses on aerobic and resistive training programs and the benefits of this training and how to safely progress through these programs..   Cardiac Rehab from 08/03/2020 in [facility] Cardiac and Pulmonary Rehab  Date 07/27/20  Educator AS  Instruction Review Code 1- Verbalizes Understanding  [resistance training]      Education: Exercise & Equipment Safety: - Individual verbal instruction and demonstration of equipment use and safety with use of the equipment.   Cardiac Rehab from 08/03/2020 in [facility] Cardiac and Pulmonary Rehab  Date 06/20/20  Educator AS  Instruction Review Code 1- Verbalizes Understanding      Education: Exercise Physiology & General Exercise Guidelines: - Group verbal and written instruction with models to review the exercise physiology of the cardiovascular system and associated critical values. Provides general exercise guidelines with specific guidelines to those with heart or lung disease.    Education: Flexibility, Balance, Mind/Body Relaxation: Provides group verbal/written instruction on the benefits of  flexibility and balance training, including mind/body exercise modes such as yoga, pilates and tai chi.  Demonstration and skill practice provided.   Cardiac Rehab from 08/03/2020 in [facility] Cardiac and Pulmonary Rehab  Date 08/03/20  Educator AS  Instruction Review Code 1- Verbalizes Understanding      Activity Barriers & Risk Stratification:   6 Minute Walk:  6 Minute Walk    Row Name 06/20/20 1039         6 Minute Walk   Phase Initial     Distance 700 feet     Walk Time 6 minutes     # of Rest Breaks 0     MPH 1.32     METS 1.6     RPE 11     Perceived Dyspnea  1     VO2 Peak 5.7     Symptoms No     Resting HR 92 bpm     Resting BP 98/56     Resting Oxygen Saturation  100 %     Exercise Oxygen Saturation  during 6 min walk 98 %     Max Ex. HR 109 bpm     Max Ex. BP 104/58     2 Minute Post BP 102/56            Oxygen Initial Assessment:   Oxygen Re-Evaluation:   Oxygen Discharge (Final Oxygen Re-Evaluation):   Initial Exercise Prescription:  Initial Exercise Prescription - 06/20/20 1000      Date of Initial Exercise RX and Referring Provider   Date 06/20/20    Referring Provider David Perez      Treadmill   MPH 1    Grade 0    Minutes 15    METs 1.77      Recumbant Bike   Level 1    RPM 60    Minutes 15    METs 1.6      NuStep   Level 1    SPM 80    Minutes 15    METs 1.6      REL-XR   Level 1    Speed 50    Minutes 15    METs 1.6      Prescription Details   Frequency (times per week) 3    Duration Progress to 30 minutes of continuous aerobic without signs/symptoms of physical distress      Intensity   THRR 40-80% of Max Heartrate 110-130    Ratings of Perceived Exertion 11-13    Perceived Dyspnea 0-4           Perform Capillary Blood Glucose checks as needed.  Exercise Prescription Changes:  Exercise Prescription Changes    Row Name 06/20/20 1000 07/06/20 1300 07/20/20 1400 08/02/20 0800  Response to Exercise   Blood  Pressure (Admit) 98/56 122/60 104/56 118/58    Blood Pressure (Exercise) 104/58 118/66 112/58 116/58    Blood Pressure (Exit) 102/56 116/58 98/52 130/80    Heart Rate (Admit) 92 bpm 92 bpm 86 bpm 102 bpm    Heart Rate (Exercise) 109 bpm 103 bpm 103 bpm 119 bpm    Heart Rate (Exit) 99 bpm 90 bpm 82 bpm 78 bpm    Oxygen Saturation (Admit) 100 % -- -- --    Oxygen Saturation (Exercise) 98 % -- -- --    Rating of Perceived Exertion (Exercise) '11 12 13 13    ' Perceived Dyspnea (Exercise) 1 -- -- --    Symptoms none none none none    Duration -- Progress to 30 minutes of  aerobic without signs/symptoms of physical distress Continue with 30 min of aerobic exercise without signs/symptoms of physical distress. Continue with 30 min of aerobic exercise without signs/symptoms of physical distress.    Intensity -- THRR unchanged THRR unchanged THRR unchanged      Progression   Progression -- Continue to progress workloads to maintain intensity without signs/symptoms of physical distress. Continue to progress workloads to maintain intensity without signs/symptoms of physical distress. Continue to progress workloads to maintain intensity without signs/symptoms of physical distress.    Average METs -- 1.5 2.03 1.73      Resistance Training   Weight -- 5 lb 5 lb 5 lb    Reps -- 10-15 10-15 10-15      Interval Training   Interval Training -- No No No      Treadmill   MPH -- -- 1 0.9    Grade -- -- 0.5 0.5    Minutes -- -- 15 15    METs -- -- 1.8 1.8      Recumbant Bike   Level -- 3 -- --    Minutes -- 15 -- --    METs -- 2.5 -- --      NuStep   Level -- 1 -- --    SPM -- 80 -- --    Minutes -- 15 -- --    METs -- 1.1 -- --      REL-XR   Level -- -- 2 1    Speed -- -- -- 50    Minutes -- -- 15 15    METs -- -- 2.2 1.1      Home Exercise Plan   Plans to continue exercise at -- -- Home (comment)  walking Home (comment)  walking    Frequency -- -- Add 2 additional days to program exercise  sessions. Add 2 additional days to program exercise sessions.    Initial Home Exercises Provided -- -- 07/18/20 07/18/20           Exercise Comments:   Exercise Goals and Review:  Exercise Goals    Row Name 06/20/20 1047             Exercise Goals   Increase Physical Activity Yes       Intervention Provide advice, education, support and counseling about physical activity/exercise needs.;Develop an individualized exercise prescription for aerobic and resistive training based on initial evaluation findings, risk stratification, comorbidities and participant's personal goals.       Expected Outcomes Short Term: Attend rehab on a regular basis to increase amount of physical activity.;Long Term: Add in home exercise to make exercise part of routine and to increase amount of physical activity.;Long  Term: Exercising regularly at least 3-5 days a week.       Increase Strength and Stamina Yes       Intervention Provide advice, education, support and counseling about physical activity/exercise needs.;Develop an individualized exercise prescription for aerobic and resistive training based on initial evaluation findings, risk stratification, comorbidities and participant's personal goals.       Expected Outcomes Short Term: Increase workloads from initial exercise prescription for resistance, speed, and METs.;Short Term: Perform resistance training exercises routinely during rehab and add in resistance training at home;Long Term: Improve cardiorespiratory fitness, muscular endurance and strength as measured by increased METs and functional capacity (6MWT)       Able to understand and use rate of perceived exertion (RPE) scale Yes       Intervention Provide education and explanation on how to use RPE scale       Expected Outcomes Short Term: Able to use RPE daily in rehab to express subjective intensity level;Long Term:  Able to use RPE to guide intensity level when exercising independently        Knowledge and understanding of Target Heart Rate Range (THRR) Yes       Intervention Provide education and explanation of THRR including how the numbers were predicted and where they are located for reference       Expected Outcomes Short Term: Able to state/look up THRR;Short Term: Able to use daily as guideline for intensity in rehab;Long Term: Able to use THRR to govern intensity when exercising independently       Able to check pulse independently Yes       Intervention Provide education and demonstration on how to check pulse in carotid and radial arteries.;Review the importance of being able to check your own pulse for safety during independent exercise       Expected Outcomes Short Term: Able to explain why pulse checking is important during independent exercise;Long Term: Able to check pulse independently and accurately       Understanding of Exercise Prescription Yes       Intervention Provide education, explanation, and written materials on patient's individual exercise prescription       Expected Outcomes Short Term: Able to explain program exercise prescription;Long Term: Able to explain home exercise prescription to exercise independently              Exercise Goals Re-Evaluation :  Exercise Goals Re-Evaluation    Row Name 06/22/20 5885 07/06/20 1357 07/18/20 0947 07/20/20 1453 08/02/20 0901     Exercise Goal Re-Evaluation   Exercise Goals Review Increase Physical Activity;Able to understand and use rate of perceived exertion (RPE) scale;Knowledge and understanding of Target Heart Rate Range (THRR);Understanding of Exercise Prescription;Increase Strength and Stamina;Able to check pulse independently Increase Physical Activity;Able to understand and use rate of perceived exertion (RPE) scale;Knowledge and understanding of Target Heart Rate Range (THRR);Understanding of Exercise Prescription;Increase Strength and Stamina;Able to check pulse independently Increase Physical  Activity;Increase Strength and Stamina;Able to understand and use rate of perceived exertion (RPE) scale;Knowledge and understanding of Target Heart Rate Range (THRR);Able to check pulse independently;Understanding of Exercise Prescription Increase Physical Activity;Increase Strength and Stamina;Understanding of Exercise Prescription Increase Physical Activity;Increase Strength and Stamina;Understanding of Exercise Prescription   Comments Reviewed RPE and dyspnea scales, THR and program prescription with pt today.  Pt voiced understanding and was given a copy of goals to take home. David Perez is tolerating exercise well.  Staff is working with him on being comfortable on the recumbent bike.  Reviewed home exercise with pt today.  Pt plans to walk and use weights for exercise.  Reviewed THR, pulse, RPE, sign and symptoms, pulse oximetery and when to call 911 or MD.  Also discussed weather considerations and indoor options.  Pt voiced understanding. David Perez is doing well in rehab.  He is now on level 2 for the XR.  We will continue to monitor his progress. David Perez attends consistently and has tried different levels on machines.  He felt incline on TM can make him very tired.  Staff will encourage him to increase level on XR and continue to slowly increase TM.   Expected Outcomes Short: Use RPE daily to regulate intensity. Long: Follow program prescription in THR. Short:  continue to attend consistently Long:  build overall strength and stamina Short:  monitor vitals while exercising at home Long:  build overall strength and stamina Short: Continue to increase workloads Long: Continue to build stamina. Short: increase workload on XR Long: build strength and stamina          Discharge Exercise Prescription (Final Exercise Prescription Changes):  Exercise Prescription Changes - 08/02/20 0800      Response to Exercise   Blood Pressure (Admit) 118/58    Blood Pressure (Exercise) 116/58    Blood Pressure (Exit) 130/80    Heart  Rate (Admit) 102 bpm    Heart Rate (Exercise) 119 bpm    Heart Rate (Exit) 78 bpm    Rating of Perceived Exertion (Exercise) 13    Symptoms none    Duration Continue with 30 min of aerobic exercise without signs/symptoms of physical distress.    Intensity THRR unchanged      Progression   Progression Continue to progress workloads to maintain intensity without signs/symptoms of physical distress.    Average METs 1.73      Resistance Training   Weight 5 lb    Reps 10-15      Interval Training   Interval Training No      Treadmill   MPH 0.9    Grade 0.5    Minutes 15    METs 1.8      REL-XR   Level 1    Speed 50    Minutes 15    METs 1.1      Home Exercise Plan   Plans to continue exercise at Home (comment)   walking   Frequency Add 2 additional days to program exercise sessions.    Initial Home Exercises Provided 07/18/20           Nutrition:  Target Goals: Understanding of nutrition guidelines, daily intake of sodium <1525m, cholesterol <2023m calories 30% from fat and 7% or less from saturated fats, daily to have 5 or more servings of fruits and vegetables.  Education: Controlling Sodium/Reading Food Labels -Group verbal and written material supporting the discussion of sodium use in heart healthy nutrition. Review and explanation with models, verbal and written materials for utilization of the food label.   Education: General Nutrition Guidelines/Fats and Fiber: -Group instruction provided by verbal, written material, models and posters to present the general guidelines for heart healthy nutrition. Gives an explanation and review of dietary fats and fiber.   Biometrics:    Nutrition Therapy Plan and Nutrition Goals:  Nutrition Therapy & Goals - 07/04/20 1019      Nutrition Therapy   Diet Heart healthy, low sodium    Protein (specify units) 50g    Fiber 30 grams    Whole Grain  Foods 3 servings    Saturated Fats 12 max. grams    Fruits and Vegetables  5 servings/day    Sodium 1.5 grams      Personal Nutrition Goals   Nutrition Goal ST: try easy to prep meals disucssed, add peanut butter to snack LT: Gain muscle    Comments Lifts 8 lb handweights at home. eats more often and says it helps more with digestion and helps hime eat more. B: soft boiled eggs (2) with whole wheat toast or special K protein cereal with skim milk - will have coffee (sweet and low) or juice. Puts protein powder in skim milk or juice. He doesn't drink whole milk because it makes him congested. He eats soft foods because he has a hard time chewing. S: fruit (blueberries or banana) L: chicken wings and macaroni salad. D:hamburger from [employer]. He doesn't cook and his wife has passed. Discussed heart healthy eating and easy low prep meals such as rotisserie chicken (with vegetables and grains/potato, with pasta, in soup with low sodium brtoh and frozen veggies, chicken salad), pasta or bean salad using canned or pre-cut vegetables, or pasta with frozen broccoli, jarred sauce, and beans or chicken.      Intervention Plan   Intervention Prescribe, educate and counsel regarding individualized specific dietary modifications aiming towards targeted core components such as weight, hypertension, lipid management, diabetes, heart failure and other comorbidities.;Nutrition handout(s) given to patient.    Expected Outcomes Short Term Goal: Understand basic principles of dietary content, such as calories, fat, sodium, cholesterol and nutrients.;Short Term Goal: A plan has been developed with personal nutrition goals set during dietitian appointment.;Long Term Goal: Adherence to prescribed nutrition plan.           Nutrition Assessments:  Nutrition Assessments - 06/20/20 1058      MEDFICTS Scores   Pre Score 54           MEDIFICTS Score Key:          ?70 Need to make dietary changes          40-70 Heart Healthy Diet         ? 40 Therapeutic Level Cholesterol Diet  Nutrition  Goals Re-Evaluation:  Nutrition Goals Re-Evaluation    [place] Name 07/18/20 0948             Goals   Nutrition Goal ST: try easy to prep meals disucssed, add peanut butter to snack LT: Gain muscle       Comment Pt reports not having yet tried ideas, will do so within next goal period.       Expected Outcome ST: try easy to prep meals disucssed, add peanut butter to snack LT: Gain muscle              Nutrition Goals Discharge (Final Nutrition Goals Re-Evaluation):  Nutrition Goals Re-Evaluation - 07/18/20 0948      Goals   Nutrition Goal ST: try easy to prep meals disucssed, add peanut butter to snack LT: Gain muscle    Comment Pt reports not having yet tried ideas, will do so within next goal period.    Expected Outcome ST: try easy to prep meals disucssed, add peanut butter to snack LT: Gain muscle           Psychosocial: Target Goals: Acknowledge presence or absence of significant depression and/or stress, maximize coping skills, provide positive support system. Participant is able to verbalize types and ability to use techniques and skills needed for  reducing stress and depression.   Education: Depression - Provides group verbal and written instruction on the correlation between heart/lung disease and depressed mood, treatment options, and the stigmas associated with seeking treatment.   Cardiac Rehab from 08/03/2020 in [facility] Cardiac and Pulmonary Rehab  Date 07/06/20  Educator SB  Instruction Review Code 1- [employer] Understanding      Education: Sleep Hygiene -Provides group verbal and written instruction about how sleep can affect your health.  Define sleep hygiene, discuss sleep cycles and impact of sleep habits. Review good sleep hygiene tips.     Education: Stress and Anxiety: - Provides group verbal and written instruction about the health risks of elevated stress and causes of high stress.  Discuss the correlation between heart/lung disease and anxiety and  treatment options. Review healthy ways to manage with stress and anxiety.    Initial Review & Psychosocial Screening:  Initial Psych Review & Screening - 06/14/20 0942      Initial Review   Current issues with None Identified    Source of Stress Concerns None Identified      Family Dynamics   Good Support System? Yes    Comments He look to his daughter for support.      Barriers   Psychosocial barriers to participate in program The patient should benefit from training in stress management and relaxation.;There are no identifiable barriers or psychosocial needs.      Screening Interventions   Interventions Provide feedback about the scores to participant;Encouraged to exercise;To provide support and resources with identified psychosocial needs    Expected Outcomes Short Term goal: Utilizing psychosocial counselor, staff and physician to assist with identification of specific Stressors or current issues interfering with healing process. Setting desired goal for each stressor or current issue identified.;Long Term Goal: Stressors or current issues are controlled or eliminated.;Short Term goal: Identification and review with participant of any Quality of Life or Depression concerns found by scoring the questionnaire.;Long Term goal: The participant improves quality of Life and PHQ9 Scores as seen by post scores and/or verbalization of changes           Quality of Life Scores:   Quality of Life - 06/20/20 1056      Quality of Life   Select Quality of Life      Quality of Life Scores   Health/Function Pre 23.5 %    Socioeconomic Pre 27.5 %    Psych/Spiritual Pre 26.79 %    Family Pre 29.38 %    GLOBAL Pre 25.81 %          Scores of 19 and below usually indicate a poorer quality of life in these areas.  A difference of  2-3 points is a clinically meaningful difference.  A difference of 2-3 points in the total score of the Quality of Life Index has been associated with significant  improvement in overall quality of life, self-image, physical symptoms, and general health in studies assessing change in quality of life.  PHQ-9: Recent Review Flowsheet Data    Depression screen [facility] 2/9 06/29/2020 06/20/2020 05/10/2020   Decreased Interest 0 0 0   Down, Depressed, Hopeless 0 0 0   PHQ - 2 Score 0 0 0   Altered sleeping 0 0 0   Tired, decreased energy 0 0 0   Change in appetite 0 0 0   Feeling bad or failure about yourself  0 0 0   Trouble concentrating 0 0 0   Moving slowly or  fidgety/restless 0 0 0   Suicidal thoughts 0 0 0   PHQ-9 Score 0 0 0   Difficult doing work/chores - - Not difficult at all     Interpretation of Total Score  Total Score Depression Severity:  1-4 = Minimal depression, 5-9 = Mild depression, 10-14 = Moderate depression, 15-19 = Moderately severe depression, 20-27 = Severe depression   Psychosocial Evaluation and Intervention:  Psychosocial Evaluation - 06/14/20 0946      Psychosocial Evaluation & Interventions   Interventions Encouraged to exercise with the program and follow exercise prescription    Comments Patiet states that he has no mental issues at this time.    Expected Outcomes Short: Exercise regularly to support mental health and notify staff of any changes. Long: maintain mental health and well being through teaching of rehab or prescribed medications independently.    Continue Psychosocial Services  Follow up required by staff           Psychosocial Re-Evaluation:  Psychosocial Re-Evaluation    [place] Name 07/18/20 0930             Psychosocial Re-Evaluation   Current issues with Current Stress Concerns       Comments David Perez has no stress or sleep concerns at this time.       Expected Outcomes Short:  attend cardiac rehab for the exercise Long: maintain positive outlook              Psychosocial Discharge (Final Psychosocial Re-Evaluation):  Psychosocial Re-Evaluation - 07/18/20 0930      Psychosocial Re-Evaluation    Current issues with Current Stress Concerns    Comments David Perez has no stress or sleep concerns at this time.    Expected Outcomes Short:  attend cardiac rehab for the exercise Long: maintain positive outlook           Vocational Rehabilitation: Provide vocational rehab assistance to qualifying candidates.   Vocational Rehab Evaluation & Intervention:   Education: Education Goals: Education classes will be provided on a variety of topics geared toward better understanding of heart health and risk factor modification. Participant will state understanding/return demonstration of topics presented as noted by education test scores.  Learning Barriers/Preferences:  Learning Barriers/Preferences - 06/14/20 0936      Learning Barriers/Preferences   Learning Barriers None    Learning Preferences None           General Cardiac Education Topics:  AED/CPR: - Group verbal and written instruction with the use of models to demonstrate the basic use of the AED with the basic ABC's of resuscitation.   Anatomy & Physiology of the Heart: - Group verbal and written instruction and models provide basic cardiac anatomy and physiology, with the coronary electrical and arterial systems. Review of Valvular disease and Heart Failure   Cardiac Procedures: - Group verbal and written instruction to review commonly prescribed medications for heart disease. Reviews the medication, class of the drug, and side effects. Includes the steps to properly store meds and maintain the prescription regimen. (beta blockers and nitrates)   Cardiac Rehab from 08/03/2020 in [facility] Cardiac and Pulmonary Rehab  Date 07/27/20  Educator SB  Instruction Review Code 1- Verbalizes Understanding      Cardiac Medications I: - Group verbal and written instruction to review commonly prescribed medications for heart disease. Reviews the medication, class of the drug, and side effects. Includes the steps to properly store meds and  maintain the prescription regimen.   Cardiac Medications II: -Group  verbal and written instruction to review commonly prescribed medications for heart disease. Reviews the medication, class of the drug, and side effects. (all other drug classes)   Cardiac Rehab from 08/03/2020 in [facility] Cardiac and Pulmonary Rehab  Date 07/13/20  Educator [facility]  Instruction Review Code 1- Verbalizes Understanding       Go Sex-Intimacy & Heart Disease, Get SMART - Goal Setting: - Group verbal and written instruction through game format to discuss heart disease and the return to sexual intimacy. Provides group verbal and written material to discuss and apply goal setting through the application of the S.M.A.R.T. Method.   Cardiac Rehab from 08/03/2020 in [facility] Cardiac and Pulmonary Rehab  Date 07/27/20  Educator SB  Instruction Review Code 1- Verbalizes Understanding      Other Matters of the Heart: - Provides group verbal, written materials and models to describe Stable Angina and Peripheral Artery. Includes description of the disease process and treatment options available to the cardiac patient.   Infection Prevention: - Provides verbal and written material to individual with discussion of infection control including proper hand washing and proper equipment cleaning during exercise session.   Cardiac Rehab from 08/03/2020 in [facility] Cardiac and Pulmonary Rehab  Date 06/20/20  Educator AS  Instruction Review Code 1- Verbalizes Understanding      Falls Prevention: - Provides verbal and written material to individual with discussion of falls prevention and safety.   Cardiac Rehab from 08/03/2020 in [facility] Cardiac and Pulmonary Rehab  Date 06/20/20  Educator AS  Instruction Review Code 1- Verbalizes Understanding      Other: -Provides group and verbal instruction on various topics (see comments)   Knowledge Questionnaire Score:   Core Components/Risk Factors/Patient Goals at Admission:  Personal Goals  and Risk Factors at Admission - 06/20/20 1055      Core Components/Risk Factors/Patient Goals on Admission    Weight Management Yes;Weight Gain    Intervention Weight Management: Develop a combined nutrition and exercise program designed to reach desired caloric intake, while maintaining appropriate intake of nutrient and fiber, sodium and fats, and appropriate energy expenditure required for the weight goal.;Weight Management: Provide education and appropriate resources to help participant work on and attain dietary goals.;Weight Management/Obesity: Establish reasonable short term and long term weight goals.    Admit Weight 113 lb 6.4 oz (51.4 kg)    Goal Weight: Short Term 120 lb (54.4 kg)    Goal Weight: Long Term 125 lb (56.7 kg)    Expected Outcomes Short Term: Continue to assess and modify interventions until short term weight is achieved;Long Term: Adherence to nutrition and physical activity/exercise program aimed toward attainment of established weight goal;Weight Maintenance: Understanding of the daily nutrition guidelines, which includes 25-35% calories from fat, 7% or less cal from saturated fats, less than 258m cholesterol, less than 1.5gm of sodium, & 5 or more servings of fruits and vegetables daily;Understanding recommendations for meals to include 15-35% energy as protein, 25-35% energy from fat, 35-60% energy from carbohydrates, less than 2096mof dietary cholesterol, 20-35 gm of total fiber daily;Understanding of distribution of calorie intake throughout the day with the consumption of 4-5 meals/snacks;Weight Gain: Understanding of general recommendations for a high calorie, high protein meal plan that promotes weight gain by distributing calorie intake throughout the day with the consumption for 4-5 meals, snacks, and/or supplements    Intervention Provide a combined exercise and nutrition program that is supplemented with education, support and counseling about heart failure. Directed  toward relieving  symptoms such as shortness of breath, decreased exercise tolerance, and extremity edema.    Expected Outcomes Improve functional capacity of life;Short term: Daily weights obtained and reported for increase. Utilizing diuretic protocols set by physician.;Short term: Attendance in program 2-3 days a week with increased exercise capacity. Reported lower sodium intake. Reported increased fruit and vegetable intake. Reports medication compliance.;Long term: Adoption of self-care skills and reduction of barriers for early signs and symptoms recognition and intervention leading to self-care maintenance.    Intervention Provide education and support for participant on nutrition & aerobic/resistive exercise along with prescribed medications to achieve LDL <84m, HDL >439m    Expected Outcomes Short Term: Participant states understanding of desired cholesterol values and is compliant with medications prescribed. Participant is following exercise prescription and nutrition guidelines.;Long Term: Cholesterol controlled with medications as prescribed, with individualized exercise RX and with personalized nutrition plan. Value goals: LDL < 7016mHDL > 40 mg.           Education:Diabetes - Individual verbal and written instruction to review signs/symptoms of diabetes, desired ranges of glucose level fasting, after meals and with exercise. Acknowledge that pre and post exercise glucose checks will be done for 3 sessions at entry of program.   Education: Know Your Numbers and Risk Factors: -Group verbal and written instruction about important numbers in your health.  Discussion of what are risk factors and how they play a role in the disease process.  Review of Cholesterol, Blood Pressure, Diabetes, and BMI and the role they play in your overall health.   [facility]  Date 07/13/20  [facility] Code 1- Verbalizes  Understanding      Core Components/Risk Factors/Patient Goals Review:   Goals and Risk Factor Review    Row Name 07/18/20 0927             Core Components/Risk Factors/Patient Goals Review   Personal Goals Review Weight Management/Obesity;Hypertension;Heart Failure;Lipids       Review David Perez says he can come in feeling sluggish and leave "feeling like a million bucks". He reports taking all medications as directed.   He has no had any heart failure symptoms.       Expected Outcomes Short: continue to take meds and attend consistently Long:  manage risk factors              Core Components/Risk Factors/Patient Goals at Discharge (Final Review):   Goals and Risk Factor Review - 07/18/20 0927      Core Components/Risk Factors/Patient Goals Review   Personal Goals Review Weight Management/Obesity;Hypertension;Heart Failure;Lipids    Review David Perez says he can come in feeling sluggish and leave "feeling like a million bucks". He reports taking all medications as directed.   He has no had any heart failure symptoms.    Expected Outcomes Short: continue to take meds and attend consistently Long:  manage risk factors           ITP Comments:  ITP Comments    Row Name 06/14/20 0949 06/20/20 1102 06/22/20 0938 07/13/20 0622 07/25/20 1005   ITP Comments Virtual Visit completed. Patient informed on EP and RD appointment and 6 Minute walk test. Patient also informed of patient health questionnaires on My Chart. Patient Verbalizes understanding. [facility] Completed 6MWT and gym orientation. Initial ITP created and sent for review to Dr. MarEmily Filbertedical Director. First full day of exercise!  Patient was  oriented to gym and equipment including functions, settings, policies, and procedures.  Patient's individual exercise prescription and treatment plan were reviewed.  All starting workloads were established based on the results of the 6 minute walk test done at  initial orientation visit.  The plan for exercise progression was also introduced and progression will be customized based on patient's performance and goals 30 Day review completed. Medical Director ITP review done, changes made as directed, and signed approval by Medical Director. David Perez is concerned that today his breathing is harder than ususal. He has no weight gain noted. Med changes occured early this month He was advised totalk to his physician about the concern. He verbalized understanding that he should call his physician.   [place] Name 08/10/20 1601           ITP Comments 30 day review completed. ITP sent to Dr. Emily Filbert, Medical Director of Cardiac and Pulmonary Rehab. Continue with ITP unless changes are made by physician.              Comments: 30 day review

## 2020-08-11 ENCOUNTER — Other Ambulatory Visit: Payer: Self-pay

## 2020-08-11 ENCOUNTER — Inpatient Hospital Stay (HOSPITAL_COMMUNITY)
Admission: RE | Admit: 2020-08-11 | Discharge: 2020-08-11 | Disposition: A | Discharge status: Home / Self Care | Payer: Medicare Other | Source: Ambulatory Visit

## 2020-08-11 NOTE — Progress Notes (Signed)
PCP: Marval Regal, NP PCP-Cardiologist: Dr. Aundra Dubin  HPI:  79 y.o. 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 He underwent CABG w/ LIMA-LAD, SVG-OM2, SVG-LPDA + bioprosthetic AVR and Maze procedure + LAA clip. Post-op required milrinone for RV support. He developed post-op left pleural effusion requiring thoracentesis. Once medically stable, he was transferred to [facility] for therapy and ultimately discharged home on 01/29/20.  Recently presented to HF clinic on 06/10/20 with Dr. Aundra Dubin. Echo was obtained and reviewed, revealing an EF of 45-50%, mid AS and apical septal severe hypokinesis, normal RV, normal bioprosthetic aortic valve mean gradient 13 mmHg. Reported doing well; rare dyspnea only with heavy exertion.  No chest pain.  No palpitations.  Lives alone but family is nearby.  Weight was up 5 lbs, he stated that he was eating better. No palpitations. He was off carvedilol due to bradycardia.  Today he returns to HF clinic for pharmacist medication titration. At recent visits to clinic, Entresto 24/26 mg BID and carvedilol 3.125 mg BID were initiated. Additionally, furosemide and digoxin were discontinued. Overall he is feeling well today. No dizziness, lightheadedness, chest pain or palpitations. No SOB/DOE. Has been working out more and lifting weights. Does not take any diuretic. Weights at home have been stable at 113-115 lbs. No LEE, PND or orthopnea. Taking all medications as prescribed and tolerating all medications.    HF Medications: Carvedilol 3.125 mg BID Entresto 24-26 mg BID Spironolactone 25 mg qHS  Has the patient been experiencing any side effects to the medications prescribed?  no  Does the patient have any problems obtaining medications due to transportation or finances?   Yes - has Medicare part D. Has Entresto and  Eliquis patient assistance.   Understanding of regimen: good Understanding of indications: good Potential of compliance: good Patient understands to avoid NSAIDs. Patient understands to avoid decongestants.    Pertinent Lab Values (08/15/20): Marland Kitchen Serum creatinine 1.12, BUN 23, Potassium 4.4, Sodium 141  Vital Signs: . Weight: 113.2 lbs (last clinic weight: 112.4 lbs) . Blood pressure: 116/58  . Heart rate: 71   Assessment: 1. CAD: S/p STEMI 12/2019 c/b cardiogenic shock requiring IABP; 99% LM stenosis noted. Now s/p CABG with LIMA-LAD, SVG-OM2, SVG-LPDA. No chest pain.  - No ASA given Eliquis use.  -Continue atorvastatin 80mg  daily, good lipids in 05/2020.  - Refer to cardiac rehab.   2. Chronic systolic CHF: Ischemic cardiomyopathy.  Echo in 2/21 with EF 30%. Now s/p CABG-AVR. Echo was done 06/10/20 and reviewed, EF improved to 45-50%.  - NYHA class II symptoms.  He is not volume overloaded on exam.  - Not taking any diuretic. Will discontinue potassium chloride 10 mEq daily.  - Increase carvedilol to 6.25 mg BID. - Continue Entresto 24/26 mg BID. Has [employer] patient assistance for [employer].  - Continue spironolactone 25 mg daily.  - Digoxin discontinued 06/10/20 due to improvement in HF symptoms.    3. Atrial fibrillation: S/p Maze, LA appendage clip in 1/21.NSR 06/10/20.  - Continue apixaban 2.5 mg twice a day. Lower dose with age and weight.    4. Hyperlipidemia: Good lipids in 6/21.  -Continueatorvastatin 80 qhs.    Plan: 1) Medication changes: Based on clinical presentation, vital signs and recent labs will increase carvedilol to 6.25 mg BID and discontinue potassium supplements.  2) Follow-up with 2 weeks with Dr.  McLean.   Audry Riles, PharmD, BCPS, BCCP, CPP Heart Failure Clinic Pharmacist [phone]

## 2020-08-12 ENCOUNTER — Encounter: Payer: Medicare Other | Admitting: *Deleted

## 2020-08-12 ENCOUNTER — Other Ambulatory Visit: Payer: Self-pay | Admitting: Cardiothoracic Surgery

## 2020-08-12 DIAGNOSIS — I252 Old myocardial infarction: Secondary | ICD-10-CM | POA: Diagnosis not present

## 2020-08-12 DIAGNOSIS — Z951 Presence of aortocoronary bypass graft: Secondary | ICD-10-CM

## 2020-08-12 NOTE — Progress Notes (Signed)
Daily Session Note  Patient Details  Name: Demontre Padin MRN: [id number] Date of Birth: 09/28/1941 Referring Provider:     Cardiac Rehab from 06/20/2020 in [facility] Cardiac and Pulmonary Rehab  Referring Provider Aundra Dubin      Encounter Date: 08/12/2020  Check In:  Session Check In - 08/12/20 0945      Check-In   Supervising physician immediately available to respond to emergencies See telemetry face sheet for immediately available ER MD    Location [facility]-Cardiac & Pulmonary Rehab    Staff Present Heath Lark, RN, BSN, CCRP;Jessica [place], MA, RCEP, CCRP, CCET;Joseph Dunnell RCP,RRT,BSRT    Virtual Visit No    Medication changes reported     No    Fall or balance concerns reported    No    Warm-up and Cool-down Performed on first and last piece of equipment    Resistance Training Performed Yes    VAD Patient? No    PAD/SET Patient? No      Pain Assessment   Currently in Pain? No/denies              Social History   Tobacco Use  Smoking Status Former Smoker  . Packs/day: 0.50  . Years: 40.00  . Pack years: 20.00  . Quit date: 49  . Years since quitting: 25.7  Smokeless Tobacco Never Used    Goals Met:  Independence with exercise equipment Exercise tolerated well No report of cardiac concerns or symptoms  Goals Unmet:  Not Applicable  Comments: Pt able to follow exercise prescription today without complaint.  Will continue to monitor for progression.    Dr. Emily Filbert is Medical Director for [place] and [name].

## 2020-08-15 ENCOUNTER — Ambulatory Visit (INDEPENDENT_AMBULATORY_CARE_PROVIDER_SITE_OTHER): Payer: Medicare Other | Admitting: Nurse Practitioner

## 2020-08-15 ENCOUNTER — Encounter: Payer: Medicare Other | Admitting: *Deleted

## 2020-08-15 ENCOUNTER — Other Ambulatory Visit: Payer: Self-pay

## 2020-08-15 ENCOUNTER — Encounter: Payer: Self-pay | Admitting: Nurse Practitioner

## 2020-08-15 VITALS — BP 118/64 | HR 84 | Temp 98.6°F | Ht 69.0 in | Wt 113.8 lb

## 2020-08-15 DIAGNOSIS — M899 Disorder of bone, unspecified: Secondary | ICD-10-CM

## 2020-08-15 DIAGNOSIS — D649 Anemia, unspecified: Secondary | ICD-10-CM

## 2020-08-15 DIAGNOSIS — Z951 Presence of aortocoronary bypass graft: Secondary | ICD-10-CM

## 2020-08-15 DIAGNOSIS — R748 Abnormal levels of other serum enzymes: Secondary | ICD-10-CM | POA: Diagnosis not present

## 2020-08-15 DIAGNOSIS — M40204 Unspecified kyphosis, thoracic region: Secondary | ICD-10-CM

## 2020-08-15 DIAGNOSIS — R634 Abnormal weight loss: Secondary | ICD-10-CM | POA: Diagnosis not present

## 2020-08-15 DIAGNOSIS — I5021 Acute systolic (congestive) heart failure: Secondary | ICD-10-CM | POA: Diagnosis not present

## 2020-08-15 DIAGNOSIS — I252 Old myocardial infarction: Secondary | ICD-10-CM | POA: Diagnosis not present

## 2020-08-15 DIAGNOSIS — R5381 Other malaise: Secondary | ICD-10-CM

## 2020-08-15 MED ORDER — MEGESTROL ACETATE 20 MG PO TABS: 20.0000 mg | ORAL_TABLET | Freq: Every day | ORAL | 4 refills | Status: AC

## 2020-08-15 NOTE — Progress Notes (Addendum)
Established Patient Office Visit  Subjective:  Patient ID: David Perez, male    DOB: 1941-04-19  Age: 79 y.o. MRN: [id number]  CC:  Chief Complaint  Patient presents with  . 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  He had postop left pleural effusion requiring thoracentesis.  Discharged on 01/29/2020.  He established care  on 02/08/2020. He comes  in with his DTR [name].  He has no specific complaints today.  He had a recent echocardiogram on 06/10/2020 that showed improvement with EF up to 45 to 50%, mid AS and apical septal severe hypokinesis, normal  LV, normal bioprosthetic aortic valve.   He has experienced no chest pain or shortness of breath.  No edema.  He was taken off Coreg due to bradycardia.  Recent EKG shows normal sinus rhythm, LVH with repolarization.  Remains on Eliquis 2.5 mg daily, atorvastatin 80 mg daily  Weight loss: He has been on Megace 20 mg daily  trying to improve appetite and weight from 122 pounds down to 107 pounds.  He has been feeling stronger, and engaged with cardiac rehab. Wt goal is 125 lb.: The food is bad bacon x2 per week.   Wt Readings from Last 3 Encounters:  08/15/20 113 lb 12.8 oz (51.6 kg)  07/06/20 112 lb 6.4 oz (51 kg)  06/29/20 113 lb (51.3 kg)   HTN: Maintained on K Chlor 10 mEq daily, spironolactone 25 mg daily, Entresto 24-26 mg 1 tablet twice daily he sees his cardiologist in October.  His blood pressure is good.  His weight is up to 125 pounds.  He has been eating foods such as bacon twice a week, and knows this may not be the best for him but is putting calories on.  He also does liquid supplements, Premier protein.  He feels like he burns calories at cardiac rehab as he is very active.  Anemia: Macrocytic anemia with MCV 107.3, platelet 245, hemoglobin 10.9-11.1 stable, B12 and  folate were normal.  Sent for hematology evaluation. The work-up is suggesting underlying bone marrow disorder such as myelodysplastic syndromes.  Dr. Tasia Catchings discussed bone marrow biopsy and the they decided on observation for now which is reasonable given that his blood counts are stable.  Repeat CBC in 6 months.  He was told to report un intentional weight loss, fever without explanation, night sweats.  His leukocytosis was thought to be reactive.  Flow cytometry was negative.  Insomnia: No longer taking trazodone and melatonin he takes trazodone 1.5 to 1 tablet at bedtime as needed along with melatonin 6 mg follow-up bedtime as needed.  Immunizations:I do recommend the tetanus vaccine, flu shot, and pneumonia vaccines which you declined. He had Covid Pfizer 03/11/20 and 04/05/20. Due booster in [place].  Diet: Higher in fat than he would like Exercise: yes Vision: needs to pick up new glasses  Past Medical History:  Diagnosis Date  . 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 Hepatitis B     Past Surgical History:  Procedure Laterality Date  . AORTIC VALVE REPLACEMENT N/A 12/31/2019   Procedure: AORTIC VALVE REPLACEMENT (AVR);  Surgeon: Wonda Olds, MD;  Location: [facility];  Service: Open Heart Surgery;  Laterality: N/A;  . CLIPPING OF ATRIAL APPENDAGE Left 12/31/2019   Procedure: Clipping Of Atrial Appendage;  Surgeon: Wonda Olds, MD;  Location: [facility];  Service: Open Heart Surgery;  Laterality: Left;  . CORONARY ARTERY BYPASS GRAFT N/A 12/31/2019   Procedure: CORONARY ARTERY BYPASS GRAFTING (CABG) TIMES THREE USING LEFT INTERNAL MAMMARY ARTERY AND LEFT GREATER SAPHENOUS LEG VEIN HARVESTED ENDOSCOPICALLY;  Surgeon: Wonda Olds, MD;  Location: [facility];  Service: Open Heart Surgery;  Laterality: N/A;  POSSIBLE BILATERAL IMA  . CORONARY/GRAFT ACUTE MI REVASCULARIZATION N/A 12/31/2019   Procedure: Coronary/Graft Acute MI Revascularization;  Surgeon: Yolonda Kida, MD;   Location: [place];  Service: Cardiovascular;  Laterality: N/A;  . IR THORACENTESIS ASP PLEURAL SPACE W/IMG GUIDE  01/14/2020  . LEFT HEART CATH AND CORONARY ANGIOGRAPHY N/A 12/31/2019   Procedure: LEFT HEART CATH AND CORONARY ANGIOGRAPHY;  Surgeon: Yolonda Kida, MD;  Location: [facility];  Service: Cardiovascular;  Laterality: N/A;  . MAZE N/A 12/31/2019   Procedure: MAZE;  Surgeon: Wonda Olds, MD;  Location: [place];  Service: Open Heart Surgery;  Laterality: N/A;  . TEE WITHOUT CARDIOVERSION N/A 12/31/2019   Procedure: TRANSESOPHAGEAL ECHOCARDIOGRAM (TEE);  Surgeon: Wonda Olds, MD;  Location: [place];  Service: Open Heart Surgery;  Laterality: N/A;    Family History  Problem Relation Age of Onset  . Cancer Father     Social History   Socioeconomic History  . Marital status: Widowed    Spouse name: Not on file  . Number of children: Not on file  . Years of education: Not on file  . Highest education level: Not on file  Occupational History  . Not on file  Tobacco Use  . Smoking status: Former Smoker    Packs/day: 0.50    Years: 40.00    Pack years: 20.00    Quit date: 1996    Years since quitting: 25.7  . Smokeless tobacco: Never Used  Vaping Use  . Vaping Use: Never used  Substance and Sexual Activity  . Alcohol use: Not Currently  . Drug use: Not Currently  . Sexual activity: Not Currently  Other Topics Concern  . Not on file  Social History Narrative  . Not on file   Social Determinants of Health   Financial Resource Strain: Low Risk   . Difficulty of Paying Living Expenses: Not hard at all  Food Insecurity: No Food Insecurity  . Worried About Charity fundraiser in the Last Year: Never true  . Ran Out of Food in the Last Year: Never true  Transportation Needs: No Transportation Needs  . Lack of Transportation (Medical): No  . Lack of Transportation (Non-Medical): No  Physical Activity: Sufficiently Active  . Days of  Exercise per Week: 3 days  . Minutes of Exercise per Session: 60 min  Stress: No Stress Concern Present  . Feeling of Stress : Not at all  Social Connections: Unknown  . Frequency of Communication with Friends and Family: More than three times a week  . Frequency of Social Gatherings with Friends and Family: Three times a week  . Attends Religious Services: Not on file  . Active Member of Clubs or Organizations: Not on file  . Attends Archivist Meetings: Not on file  . Marital Status: Widowed  Intimate Partner Violence: Not At Risk  . Fear of Current or Ex-Partner: No  . Emotionally Abused: No  . Physically Abused: No  . Sexually Abused: No    Outpatient Medications Prior to Visit  Medication Sig Dispense Refill  . apixaban (ELIQUIS) 2.5 MG  TABS tablet Take 1 tablet (2.5 mg total) by mouth every 12 (twelve) hours. 180 tablet 3  . atorvastatin (LIPITOR) 80 MG tablet Take 1 tablet (80 mg total) by mouth daily at 6 PM. 90 tablet 3  . carvedilol (COREG) 3.125 MG tablet Take 1 tablet (3.125 mg total) by mouth 2 (two) times daily. 60 tablet 11  . potassium chloride (KLOR-CON) 10 MEQ tablet Take 1 tablet (10 mEq total) by mouth daily. 90 tablet 3  . sacubitril-valsartan (ENTRESTO) 24-26 MG Take 1 tablet by mouth 2 (two) times daily. 60 tablet 11  . spironolactone (ALDACTONE) 25 MG tablet Take 1 tablet (25 mg total) by mouth at bedtime. 90 tablet 3  . megestrol (MEGACE) 20 MG tablet TAKE 1 TABLET BY MOUTH ONCE DAILY 30 tablet 2   No facility-administered medications prior to visit.    No Known Allergies  Review of Systems  Constitutional: Negative for chills, fatigue, fever and unexpected weight change.  HENT: Negative.   Respiratory: Negative.   Cardiovascular: Negative.   Gastrointestinal: Negative.   Genitourinary: Negative.   Neurological: Negative.   Hematological: Negative for adenopathy. Does not bruise/bleed easily.  Psychiatric/Behavioral: Negative.        No  depression or anxiety concerns.       Objective:    Physical Exam Vitals reviewed.  Constitutional:      Appearance: Normal appearance.     Comments: Underweight- after CABG in Jan- but gaining slowly  Cardiovascular:     Rate and Rhythm: Normal rate and regular rhythm.     Pulses: Normal pulses.  Pulmonary:     Effort: Pulmonary effort is normal.     Breath sounds: Normal breath sounds.  Abdominal:     Palpations: Abdomen is soft.     Tenderness: There is no abdominal tenderness.  Musculoskeletal:        General: Normal range of motion.     Cervical back: Normal range of motion and neck supple.  Skin:    General: Skin is warm and dry.  Neurological:     General: No focal deficit present.     Mental Status: He is alert and oriented to person, place, and time.  Psychiatric:        Mood and Affect: Mood normal.        Behavior: Behavior normal.     BP 118/64   Pulse 84   Temp 98.6 F (37 C) (Oral)   Ht '5\' 9"'  (1.753 m)   Wt 113 lb 12.8 oz (51.6 kg)   SpO2 97%   BMI 16.81 kg/m  Wt Readings from Last 3 Encounters:  08/15/20 113 lb 12.8 oz (51.6 kg)  07/06/20 112 lb 6.4 oz (51 kg)  06/29/20 113 lb (51.3 kg)   Pulse Readings from Last 3 Encounters:  08/17/20 70  08/15/20 84  07/06/20 98    BP Readings from Last 3 Encounters:  08/17/20 109/66  08/15/20 118/64  07/06/20 116/68    Lab Results  Component Value Date   CHOL 122 08/15/2020   HDL 47.90 08/15/2020   LDLCALC 65 08/15/2020   TRIG 47.0 08/15/2020   CHOLHDL 3 08/15/2020      There are no preventive care reminders to display for this patient.  There are no preventive care reminders to display for this patient.  Lab Results  Component Value Date   TSH 2.60 08/15/2020   Lab Results  Component Value Date   WBC 10.6 (H) 06/16/2020   HGB 11.1 (  L) 06/16/2020   HCT 31.4 (L) 06/16/2020   MCV 104.0 (H) 06/16/2020   PLT 284 06/16/2020   Lab Results  Component Value Date   NA 141 08/15/2020   K  4.4 08/15/2020   CO2 25 08/15/2020   GLUCOSE 98 08/15/2020   BUN 23 08/15/2020   CREATININE 1.12 08/15/2020   BILITOT 1.1 08/15/2020   ALKPHOS 70 08/15/2020   AST 21 08/15/2020   ALT 20 08/15/2020   PROT 6.9 08/15/2020   ALBUMIN 4.0 08/15/2020   CALCIUM 9.3 08/15/2020   ANIONGAP 9 06/30/2020   GFR 62.83 08/15/2020   Lab Results  Component Value Date   CHOL 122 08/15/2020   Lab Results  Component Value Date   HDL 47.90 08/15/2020   Lab Results  Component Value Date   LDLCALC 65 08/15/2020   Lab Results  Component Value Date   TRIG 47.0 08/15/2020   Lab Results  Component Value Date   CHOLHDL 3 08/15/2020   Lab Results  Component Value Date   HGBA1C 5.2 12/31/2019      Assessment & Plan:   Problem List Items Addressed This Visit      Cardiovascular and Mediastinum   Acute systolic congestive heart failure (HCC)   Relevant Orders   Lipid panel (Completed)     Musculoskeletal and Integument   Kyphosis of thoracic region     Other   Debility   Anemia - Primary   Elevated liver enzymes   Relevant Orders   Comprehensive metabolic panel (Completed)   Weight loss   Relevant Orders   TSH (Completed)   VITAMIN D 25 Hydroxy (Vit-D Deficiency, Fractures) (Completed)    Other Visit Diagnoses    Disorder of bone, unspecified        Relevant Orders   VITAMIN D 25 Hydroxy (Vit-D Deficiency, Fractures) (Completed)      Meds ordered this encounter  Medications  . megestrol (MEGACE) 20 MG tablet    Sig: Take 1 tablet (20 mg total) by mouth daily.    Dispense:  30 tablet    Refill:  4    Order Specific Question:   Supervising Provider    Answer:   Einar Pheasant [[id number]]   Patient looks very good today.  His vital signs are stable.  Blood pressure is good.  Weight is up a pound.  He has much more energy, and looks a lot stronger.  Cardiac rehab is helping.  His lungs are clear and there is no edema.  He has no chest pain or shortness of breath.  Routine  laboratory studies obtained today are unremarkable with exception of slight low vitamin D. His labs look great. Vit D a little low- Begin Vit D3 800 IU daily.  He was advised:   Continue to follow with healthy diet only discussed getting.  I refilled your Megace for you.  You are looking really good.  Follow-up with hematology and cardiology as planned.  Continue with cardiac rehab as planned.  I do recommend the tetanus vaccine, flu shot, and pneumonia vaccines which you declined.  You have had recent eye exam, new eyeglasses are ready for pickup.  Follow-up office visit in 3 months.  Follow-up: Return in about 3 months (around 11/14/2020).   This visit occurred during the SARS-CoV-2 public health emergency.  Safety protocols were in place, including screening questions prior to the visit, additional usage of staff PPE, and extensive cleaning of exam room while observing appropriate contact time as indicated  for disinfecting solutions.     Denice Paradise, NP

## 2020-08-15 NOTE — Progress Notes (Signed)
Daily Session Note  Patient Details  Name: Jermane Brayboy MRN: [id number] Date of Birth: 07-23-1941 Referring Provider:     Cardiac Rehab from 06/20/2020 in [facility]  Referring Provider Aundra Dubin      Encounter Date: 08/15/2020  Check In:  Session Check In - 08/15/20 0958      Check-In   Supervising physician immediately available to respond to emergencies See telemetry face sheet for immediately available ER MD    Location [facility]-Cardiac & Pulmonary Rehab    Staff Present Heath Lark, RN, BSN, Laveda Norman, BS, ACSM CEP, Exercise Physiologist;Joseph Tessie Fass RCP,RRT,BSRT    Virtual Visit No    Medication changes reported     No    Fall or balance concerns reported    No    Warm-up and Cool-down Performed on first and last piece of equipment    Resistance Training Performed Yes    VAD Patient? No    PAD/SET Patient? No      Pain Assessment   Currently in Pain? No/denies              Social History   Tobacco Use  Smoking Status Former Smoker  . Packs/day: 0.50  . Years: 40.00  . Pack years: 20.00  . Quit date: 62  . Years since quitting: 25.7  Smokeless Tobacco Never Used    Goals Met:  Independence with exercise equipment Exercise tolerated well No report of cardiac concerns or symptoms  Goals Unmet:  Not Applicable  Comments: Pt able to follow exercise prescription today without complaint.  Will continue to monitor for progression.    Dr. Emily Filbert is Medical Director for [name].

## 2020-08-15 NOTE — Patient Instructions (Addendum)
Please go to the lab today.  Continue with your current medication.  Continue to follow with healthy diet only discussed getting.  I refilled your Megace for you.  You are looking really good.  Follow-up with hematology and cardiology as planned.  Continue with cardiac rehab as planned.  I do recommend the tetanus vaccine, flu shot, and pneumonia vaccines which you declined.  You have had recent eye exam, new eyeglasses are ready for pickup.  Follow-up office visit in 3 months.

## 2020-08-16 LAB — COMPREHENSIVE METABOLIC PANEL
ALT: 20 U/L (ref 0–53)
AST: 21 U/L (ref 0–37)
Albumin: 4 g/dL (ref 3.5–5.2)
Alkaline Phosphatase: 70 U/L (ref 39–117)
BUN: 23 mg/dL (ref 6–23)
CO2: 25 mEq/L (ref 19–32)
Calcium: 9.3 mg/dL (ref 8.4–10.5)
Chloride: 108 mEq/L (ref 96–112)
Creatinine, Ser: 1.12 mg/dL (ref 0.40–1.50)
GFR: 62.83 mL/min (ref >60.00)
Glucose, Bld: 98 mg/dL (ref 70–99)
Potassium: 4.4 mEq/L (ref 3.5–5.1)
Sodium: 141 mEq/L (ref 135–145)
Total Bilirubin: 1.1 mg/dL (ref 0.2–1.2)
Total Protein: 6.9 g/dL (ref 6.0–8.3)

## 2020-08-16 LAB — LIPID PANEL
Cholesterol: 122 mg/dL (ref 0–200)
HDL: 47.9 mg/dL (ref >39.00)
LDL Cholesterol: 65 mg/dL (ref 0–99)
NonHDL: 73.92
Total CHOL/HDL Ratio: 3
Triglycerides: 47 mg/dL (ref 0.0–149.0)
VLDL: 9.4 mg/dL (ref 0.0–40.0)

## 2020-08-16 LAB — VITAMIN D 25 HYDROXY (VIT D DEFICIENCY, FRACTURES): VITD: 27.59 ng/mL — ABNORMAL LOW (ref 30.00–100.00)

## 2020-08-16 LAB — TSH: TSH: 2.6 u[IU]/mL (ref 0.35–4.50)

## 2020-08-17 ENCOUNTER — Ambulatory Visit
Admission: EM | Admit: 2020-08-17 | Discharge: 2020-08-17 | Disposition: A | Discharge status: Home / Self Care | Payer: Medicare Other

## 2020-08-17 ENCOUNTER — Encounter: Payer: Medicare Other | Admitting: *Deleted

## 2020-08-17 ENCOUNTER — Other Ambulatory Visit: Payer: Self-pay

## 2020-08-17 DIAGNOSIS — Z952 Presence of prosthetic heart valve: Secondary | ICD-10-CM

## 2020-08-17 DIAGNOSIS — H1131 Conjunctival hemorrhage, right eye: Secondary | ICD-10-CM

## 2020-08-17 DIAGNOSIS — Z951 Presence of aortocoronary bypass graft: Secondary | ICD-10-CM

## 2020-08-17 DIAGNOSIS — I252 Old myocardial infarction: Secondary | ICD-10-CM | POA: Diagnosis not present

## 2020-08-17 DIAGNOSIS — I213 ST elevation (STEMI) myocardial infarction of unspecified site: Secondary | ICD-10-CM

## 2020-08-17 NOTE — Progress Notes (Signed)
Daily Session Note  Patient Details  Name: David Perez MRN: [id number] Date of Birth: Mar 10, 1941 Referring Provider:     Cardiac Rehab from 06/20/2020 in [facility] Card[facility] and [facility] Rehab  Referring Provider Aundra Dubin      Encounter Date: 08/17/2020  Check In:  Session Check In - 08/17/20 0931      Check-In   Supervising physician immediately available to respond to emergencies See telemetry face sheet for immediately available ER MD    Location [facility]-Cardiac & Pulmonary Rehab    Staff Present Darel Hong, RN BSN;Melissa Caiola RDN, Rowe Pavy, BA, ACSM CEP, Exercise Physiologist;Joseph Tessie Fass RCP,RRT,BSRT    Virtual Visit No    Medication changes reported     No    Fall or balance concerns reported    No    Tobacco Cessation No Change    Warm-up and Cool-down Performed on first and last piece of equipment    Resistance Training Performed Yes    VAD Patient? No    PAD/SET Patient? No      Pain Assessment   Currently in Pain? No/denies              Social History   Tobacco Use  Smoking Status Former Smoker  . Packs/day: 0.50  . Years: 40.00  . Pack years: 20.00  . Quit date: 24  . Years since quitting: 25.7  Smokeless Tobacco Never Used    Goals Met:  Independence with exercise equipment Exercise tolerated well No report of cardiac concerns or symptoms Strength training completed today  Goals Unmet:  Not Applicable  Comments: Pt able to follow exercise prescription today without complaint.  Will continue to monitor for progression.    Dr. Emily Filbert is Medical Director for [name].

## 2020-08-17 NOTE — Discharge Instructions (Signed)
Call your primary care provider to discuss the bleeding in your eye while on Eliquis.    Follow-up with your eye doctor for a recheck in 1 to 2 days if your symptoms are not improving.    Go to the emergency department if you have acute eye pain, changes in your vision, or other concerning symptoms.

## 2020-08-17 NOTE — ED Triage Notes (Signed)
Patient reports he woke up this morning and noticed redness in the lateral corner of his right eye. States he went to the cardiovascular rehab gym and when he got in the car to leave he looked and saw the redness had spread. Denies visual disturbances or photophobia.

## 2020-08-17 NOTE — ED Provider Notes (Signed)
David Perez    CSN: [id number] Arrival date & time: 08/17/20  1233      History   Chief Complaint Chief Complaint  Patient presents with  . Eye Problem    HPI David Perez is a 79 y.o. male.  Patient presents with redness in the corner of his right eye this morning when he woke up. He states the redness spread after he went to the gym. He denies acute eye pain or changes in his vision. No injury. Patient is on Eliquis. Patient denies eye drainage, photophobia, eye itching, fever, chills, sore throat, cough, shortness of breath, vomiting, diarrhea, or other symptoms. His medical history includes CAD, hepatitis B, chronic anticoagulation, pleural effusion, CHF, anemia, acute kidney injury, STEMI, CABG.  The history is provided by the patient.    Past Medical History:  Diagnosis Date  . 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 Hepatitis B     Patient Active Problem List   Diagnosis Date Noted  . Elevated liver enzymes 08/15/2020  . Weight loss 08/15/2020  . 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 Chronic anticoagulation 05/11/2020  . Preventative health care 05/10/2020  . Pleural effusion 01/30/2020  . CHF (congestive heart failure) ([place])   . Leukocytosis   . Anemia   . Acute systolic congestive heart failure ([place])   . AKI (acute kidney injury) ([place])   . SOB (shortness of breath)   . Acute on chronic systolic CHF (congestive heart failure) ([place])   . Debility 01/08/2020  . STEMI involving left main coronary artery ([place]) 12/31/2019  . STEMI (ST elevation myocardial infarction) ([place]) 12/31/2019  . 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 AORTIC VALVE REPLACEMENT N/A 12/31/2019   Procedure: AORTIC VALVE REPLACEMENT (AVR);  Surgeon: Wonda Olds, MD;  Location: [facility];  Service: Open Heart Surgery;  Laterality: N/A;  . CLIPPING OF ATRIAL APPENDAGE Left 12/31/2019   Procedure: Clipping Of Atrial  Appendage;  Surgeon: Wonda Olds, MD;  Location: [facility];  Service: Open Heart Surgery;  Laterality: Left;  . CORONARY ARTERY BYPASS GRAFT N/A 12/31/2019   Procedure: CORONARY ARTERY BYPASS GRAFTING (CABG) TIMES THREE USING LEFT INTERNAL MAMMARY ARTERY AND LEFT GREATER SAPHENOUS LEG VEIN HARVESTED ENDOSCOPICALLY;  Surgeon: Wonda Olds, MD;  Location: [facility];  Service: Open Heart Surgery;  Laterality: N/A;  POSSIBLE BILATERAL IMA  . CORONARY/GRAFT ACUTE MI REVASCULARIZATION N/A 12/31/2019   Procedure: Coronary/Graft Acute MI Revascularization;  Surgeon: Yolonda Kida, MD;  Location: [facility];  Service: Cardiovascular;  Laterality: N/A;  . IR THORACENTESIS ASP PLEURAL SPACE W/IMG GUIDE  01/14/2020  . LEFT HEART CATH AND CORONARY ANGIOGRAPHY N/A 12/31/2019   Procedure: LEFT HEART CATH AND CORONARY ANGIOGRAPHY;  Surgeon: Yolonda Kida, MD;  Location: [facility];  Service: Cardiovascular;  Laterality: N/A;  . MAZE N/A 12/31/2019   Procedure: MAZE;  Surgeon: Wonda Olds, MD;  Location: [facility];  Service: Open Heart Surgery;  Laterality: N/A;  . TEE WITHOUT CARDIOVERSION N/A 12/31/2019   Procedure: TRANSESOPHAGEAL ECHOCARDIOGRAM (TEE);  Surgeon: Wonda Olds, MD;  Location: [facility];  Service: Open Heart Surgery;  Laterality: N/A;       Home Medications    Prior to Admission medications   Medication Sig Start Date End Date Taking? Authorizing Provider  apixaban (ELIQUIS) 2.5 MG TABS tablet Take 1 tablet (2.5 mg total) by mouth every  12 (twelve) hours. 02/25/20  Yes Clegg, Amy D, NP  atorvastatin (LIPITOR) 80 MG tablet Take 1 tablet (80 mg total) by mouth daily at 6 PM. 02/25/20  Yes Clegg, Amy D, NP  carvedilol (COREG) 3.125 MG tablet Take 1 tablet (3.125 mg total) by mouth 2 (two) times daily. 07/06/20  Yes Larey Dresser, MD  megestrol (MEGACE) 20 MG tablet Take 1 tablet (20 mg total) by mouth daily. 08/15/20  Yes Marval Regal, NP  potassium chloride  (KLOR-CON) 10 MEQ tablet Take 1 tablet (10 mEq total) by mouth daily. 02/25/20  Yes Clegg, Amy D, NP  sacubitril-valsartan (ENTRESTO) 24-26 MG Take 1 tablet by mouth 2 (two) times daily. 06/10/20  Yes Larey Dresser, MD  spironolactone (ALDACTONE) 25 MG tablet Take 1 tablet (25 mg total) by mouth at bedtime. 04/18/20  Yes Clegg, Amy D, NP    Family History Family History  Problem Relation Age of Onset  . Cancer Father     Social History Social History   Tobacco Use  . Smoking status: Former Smoker    Packs/day: 0.50    Years: 40.00    Pack years: 20.00    Quit date: 1996    Years since quitting: 25.7  . Smokeless tobacco: Never Used  Vaping Use  . Vaping Use: Never used  Substance Use Topics  . Alcohol use: Not Currently  . Drug use: Not Currently     Allergies   Patient has no known allergies.   Review of Systems Review of Systems  Constitutional: Negative for chills and fever.  HENT: Negative for ear pain and sore throat.   Eyes: Positive for redness. Negative for photophobia, pain, discharge, itching and visual disturbance.  Respiratory: Negative for cough and shortness of breath.   Cardiovascular: Negative for chest pain and palpitations.  Gastrointestinal: Negative for abdominal pain and vomiting.  Genitourinary: Negative for dysuria and hematuria.  Musculoskeletal: Negative for arthralgias and back pain.  Skin: Negative for color change and rash.  Neurological: Negative for seizures and syncope.  All other systems reviewed and are negative.    Physical Exam Triage Vital Signs ED Triage Vitals  Enc Vitals Group     BP      Pulse      Resp      Temp      Temp src      SpO2      Weight      Height      Head Circumference      Peak Flow      Pain Score      Pain Loc      Pain Edu?      Excl. in [place]?    No data found.  Updated Vital Signs BP 109/66   Pulse 70   Temp 99 F (37.2 C)   Resp 12   SpO2 100%   Visual Acuity Right Eye Distance:     Left Eye Distance:   Bilateral Distance:    Right Eye Near:   Left Eye Near:    Bilateral Near:     Physical Exam Vitals and nursing note reviewed.  Constitutional:      General: He is not in acute distress.    Appearance: He is well-developed.  HENT:     Head: Normocephalic and atraumatic.     Mouth/Throat:     Mouth: Mucous membranes are moist.  Eyes:     Extraocular Movements: Extraocular movements intact.  Pupils: Pupils are equal, round, and reactive to light.   Cardiovascular:     Rate and Rhythm: Normal rate and regular rhythm.     Heart sounds: No murmur heard.   Pulmonary:     Effort: Pulmonary effort is normal. No respiratory distress.     Breath sounds: Normal breath sounds.  Abdominal:     Palpations: Abdomen is soft.     Tenderness: There is no abdominal tenderness.  Musculoskeletal:     Cervical back: Neck supple.  Skin:    General: Skin is warm and dry.  Neurological:     General: No focal deficit present.     Mental Status: He is alert and oriented to person, place, and time.     Gait: Gait normal.  Psychiatric:        Mood and Affect: Mood normal.        Behavior: Behavior normal.      UC Treatments / Results  Labs (all labs ordered are listed, but only abnormal results are displayed) Labs Reviewed - No data to display  EKG   Radiology No results found.  Procedures Procedures (including critical care time)  Medications Ordered in UC Medications - No data to display  Initial Impression / Assessment and Plan / UC Course  I have reviewed the triage vital signs and the nursing notes.  Pertinent labs & imaging results that were available during my care of the patient were reviewed by me and considered in my medical decision making (see chart for details).   Conjunctival hemorrhage of the right eye. Patient is on Eliquis. Instructed him to call his PCP to discuss the hemorrhage while on Eliquis. Instructed him to schedule a follow-up  appointment with his eye care provider in 1 to 2 days. Instructed him to go to the ED if he has acute eye pain, changes in his vision, or other concerning symptoms. Patient agrees to plan of care.   Final Clinical Impressions(s) / UC Diagnoses   Final diagnoses:  Conjunctival hemorrhage of right eye     Discharge Instructions     Call your primary care provider to discuss the bleeding in your eye while on Eliquis.    Follow-up with your eye doctor for a recheck in 1 to 2 days if your symptoms are not improving.    Go to the emergency department if you have acute eye pain, changes in your vision, or other concerning symptoms.         ED Prescriptions    None     PDMP not reviewed this encounter.   Sharion Balloon, NP 08/17/20 1325

## 2020-08-18 ENCOUNTER — Telehealth (HOSPITAL_COMMUNITY): Payer: Self-pay | Admitting: *Deleted

## 2020-08-18 ENCOUNTER — Telehealth: Payer: Self-pay | Admitting: Nurse Practitioner

## 2020-08-18 ENCOUNTER — Encounter: Payer: Self-pay | Admitting: Nurse Practitioner

## 2020-08-18 NOTE — Telephone Encounter (Signed)
Pt left VM stating he was seen at urgent care for a conjunctival hemorrhage of right eye. He was told to let Dr.McLean know because he is on eliquis.  Routed to [place]

## 2020-08-18 NOTE — Telephone Encounter (Signed)
Cardiology note said his Coreg was stopped in July d/t brady. He reported taking it- will you double check this?

## 2020-08-18 NOTE — Telephone Encounter (Signed)
Left VM requesting pt return my call  

## 2020-08-18 NOTE — Telephone Encounter (Signed)
Did he see an ophthalmologist?  Unless he was specifically told to stop Eliquis, he can hold it for 2 days then restart.

## 2020-08-19 ENCOUNTER — Encounter: Payer: Medicare Other | Admitting: *Deleted

## 2020-08-19 ENCOUNTER — Other Ambulatory Visit: Payer: Self-pay

## 2020-08-19 DIAGNOSIS — Z951 Presence of aortocoronary bypass graft: Secondary | ICD-10-CM

## 2020-08-19 DIAGNOSIS — I252 Old myocardial infarction: Secondary | ICD-10-CM | POA: Diagnosis not present

## 2020-08-19 NOTE — Progress Notes (Signed)
Daily Session Note  Patient Details  Name: David Perez MRN: [id number] Date of Birth: 03/04/1941 Referring Provider:     Cardiac Rehab from 06/20/2020 in [facility] Cardiac and Pulmonary Rehab  Referring Provider Aundra Dubin      Encounter Date: 08/19/2020  Check In:  Session Check In - 08/19/20 0933      Check-In   Supervising physician immediately available to respond to emergencies See telemetry face sheet for immediately available ER MD    Location [facility]-Cardiac & Pulmonary Rehab    Staff Present Heath Lark, RN, BSN, CCRP;Jessica [place], MA, RCEP, CCRP, CCET;Joseph Carlos RCP,RRT,BSRT    Virtual Visit No    Medication changes reported     No    Fall or balance concerns reported    No    Warm-up and Cool-down Performed on first and last piece of equipment    Resistance Training Performed Yes    VAD Patient? No    PAD/SET Patient? No      Pain Assessment   Currently in Pain? No/denies              Social History   Tobacco Use  Smoking Status Former Smoker  . Packs/day: 0.50  . Years: 40.00  . Pack years: 20.00  . Quit date: 72  . Years since quitting: 25.7  Smokeless Tobacco Never Used    Goals Met:  Independence with exercise equipment Exercise tolerated well No report of cardiac concerns or symptoms  Goals Unmet:  Not Applicable  Comments: Pt able to follow exercise prescription today without complaint.  Will continue to monitor for progression.    Dr. Emily Filbert is Medical Director for [facility] and [name].

## 2020-08-22 ENCOUNTER — Ambulatory Visit (HOSPITAL_COMMUNITY)
Admission: RE | Admit: 2020-08-22 | Discharge: 2020-08-22 | Disposition: A | Discharge status: Home / Self Care | Payer: Medicare Other | Source: Ambulatory Visit | Attending: Nurse Practitioner | Admitting: Nurse Practitioner

## 2020-08-22 ENCOUNTER — Encounter: Payer: Medicare Other | Admitting: *Deleted

## 2020-08-22 ENCOUNTER — Other Ambulatory Visit: Payer: Self-pay

## 2020-08-22 VITALS — BP 116/58 | HR 71 | Wt 113.2 lb

## 2020-08-22 DIAGNOSIS — I252 Old myocardial infarction: Secondary | ICD-10-CM | POA: Insufficient documentation

## 2020-08-22 DIAGNOSIS — Z95818 Presence of other cardiac implants and grafts: Secondary | ICD-10-CM | POA: Insufficient documentation

## 2020-08-22 DIAGNOSIS — I4891 Unspecified atrial fibrillation: Secondary | ICD-10-CM | POA: Diagnosis not present

## 2020-08-22 DIAGNOSIS — E785 Hyperlipidemia, unspecified: Secondary | ICD-10-CM | POA: Diagnosis not present

## 2020-08-22 DIAGNOSIS — Z953 Presence of xenogenic heart valve: Secondary | ICD-10-CM | POA: Insufficient documentation

## 2020-08-22 DIAGNOSIS — Z951 Presence of aortocoronary bypass graft: Secondary | ICD-10-CM | POA: Diagnosis not present

## 2020-08-22 DIAGNOSIS — I5022 Chronic systolic (congestive) heart failure: Secondary | ICD-10-CM | POA: Insufficient documentation

## 2020-08-22 DIAGNOSIS — Z955 Presence of coronary angioplasty implant and graft: Secondary | ICD-10-CM | POA: Diagnosis not present

## 2020-08-22 DIAGNOSIS — I255 Ischemic cardiomyopathy: Secondary | ICD-10-CM | POA: Diagnosis not present

## 2020-08-22 DIAGNOSIS — I350 Nonrheumatic aortic (valve) stenosis: Secondary | ICD-10-CM | POA: Diagnosis not present

## 2020-08-22 DIAGNOSIS — Z79899 Other long term (current) drug therapy: Secondary | ICD-10-CM | POA: Diagnosis not present

## 2020-08-22 DIAGNOSIS — Z7901 Long term (current) use of anticoagulants: Secondary | ICD-10-CM | POA: Insufficient documentation

## 2020-08-22 MED ORDER — CARVEDILOL 6.25 MG PO TABS
6.2500 mg | ORAL_TABLET | Freq: Two times a day (BID) | ORAL | 11 refills | Status: AC
Start: 2020-08-22 — End: ?

## 2020-08-22 NOTE — Patient Instructions (Addendum)
It was a pleasure seeing you today!  MEDICATIONS: -We are changing your medications today -Increase carvedilol to 6.25 mg (1 tablet) twice daily. You may take 2 tablets of the 3.125 mg strength twice daily until you pick up the new strength.  -Stop taking potassium supplements -Call if you have questions about your medications.  NEXT APPOINTMENT: Return to clinic in 2 weeks with Dr. Aundra Dubin.  In general, to take care of your heart failure: -Limit your fluid intake to 2 Liters (half-gallon) per day.   -Limit your salt intake to ideally 2-3 grams (2000-3000 mg) per day. -Weigh yourself daily and record, and bring that "weight diary" to your next appointment.  (Weight gain of 2-3 pounds in 1 day typically means fluid weight.) -The medications for your heart are to help your heart and help you live longer.   -Please contact us before stopping any of your heart medications.  Call the clinic at [phone] with questions or to reschedule future appointments.

## 2020-08-22 NOTE — Telephone Encounter (Signed)
LMTCB

## 2020-08-22 NOTE — Progress Notes (Signed)
Daily Session Note  Patient Details  Name: David Perez MRN: [id number] Date of Birth: 1941-06-27 Referring Provider:     Cardiac Rehab from 06/20/2020 in [facility] Cardiac and Pulmonary Rehab  Referring Provider Aundra Dubin      Encounter Date: 08/22/2020  Check In:  Session Check In - 08/22/20 0956      Check-In   Supervising physician immediately available to respond to emergencies See telemetry face sheet for immediately available ER MD    Location [facility]-Cardiac & Pulmonary Rehab    Staff Present Heath Lark, RN, BSN, [place]aved[place] Norman, BS, ACSM CEP, Exercise Physiologist;Joseph Tessie Fass RCP,RRT,BSRT    Virtual Visit No    Medication changes reported     No    Fall or balance concerns reported    No    Warm-up and Cool-down Performed on first and last piece of equipment    Resistance Training Performed Yes    VAD Patient? No    PAD/SET Patient? No      Pain Assessment   Currently in Pain? No/denies              Social History   Tobacco Use  Smoking Status Former Smoker  . Packs/day: 0.50  . Years: 40.00  . Pack years: 20.00  . Quit date: 45  . Years since quitting: 25.7  Smokeless Tobacco Never Used    Goals Met:  Independence with exercise equipment Exercise tolerated well No report of cardiac concerns or symptoms  Goals Unmet:  Not Applicable  Comments: Pt able to follow exercise prescription today without complaint.  Will continue to monitor for progression.    Dr. Emily Filbert is Medical Director for [place] and [name].

## 2020-08-22 NOTE — Telephone Encounter (Signed)
Patient is taking the coreg; it was increased today by cardio. His chart is updated with correct medication list.

## 2020-08-24 ENCOUNTER — Encounter: Payer: Medicare Other | Admitting: *Deleted

## 2020-08-24 ENCOUNTER — Other Ambulatory Visit: Payer: Self-pay

## 2020-08-24 DIAGNOSIS — I252 Old myocardial infarction: Secondary | ICD-10-CM | POA: Diagnosis not present

## 2020-08-24 DIAGNOSIS — I213 ST elevation (STEMI) myocardial infarction of unspecified site: Secondary | ICD-10-CM

## 2020-08-24 DIAGNOSIS — Z952 Presence of prosthetic heart valve: Secondary | ICD-10-CM

## 2020-08-24 DIAGNOSIS — Z951 Presence of aortocoronary bypass graft: Secondary | ICD-10-CM

## 2020-08-24 NOTE — Progress Notes (Signed)
Daily Session Note  Patient Details  Name: Cordel Drewes MRN: [id number] Date of Birth: 05-24-1941 Referring Provider:     Cardiac Rehab from 06/20/2020 in [facility] Cardiac and Pulmonary Rehab  Referring Provider Aundra Dubin      Encounter Date: 08/24/2020  Check In:  Session Check In - 08/24/20 0017      Check-In   Supervising physician immediately available to respond to emergencies See telemetry face sheet for immediately available ER MD    Location [facility]-Cardiac & Pulmonary Rehab    Staff Present Darel Hong, RN BSN;Joseph Foy [place], [place], ACSM CEP, Exercise Physiologist;Melissa Caiola RDN, LDN    Virtual Visit No    Medication changes reported     No    Fall or balance concerns reported    No    Warm-up and Cool-down Performed on first and last piece of equipment    Resistance Training Performed Yes    VAD Patient? No    PAD/SET Patient? No      Pain Assessment   Currently in Pain? No/denies              Social History   Tobacco Use  Smoking Status Former Smoker  . Packs/day: 0.50  . Years: 40.00  . Pack years: 20.00  . Quit date: 47  . Years since quitting: 25.7  Smokeless Tobacco Never Used    Goals Met:  Independence with exercise equipment Exercise tolerated well No report of cardiac concerns or symptoms Strength training completed today  Goals Unmet:  Not Applicable  Comments: Pt able to follow exercise prescription today without complaint.  Will continue to monitor for progression.    Dr. Emily Filbert is Medical Director for [place] and [name].

## 2020-08-26 ENCOUNTER — Encounter: Payer: Medicare Other | Admitting: *Deleted

## 2020-08-26 ENCOUNTER — Other Ambulatory Visit: Payer: Self-pay

## 2020-08-26 DIAGNOSIS — I252 Old myocardial infarction: Secondary | ICD-10-CM | POA: Diagnosis not present

## 2020-08-26 DIAGNOSIS — Z951 Presence of aortocoronary bypass graft: Secondary | ICD-10-CM

## 2020-08-26 NOTE — Progress Notes (Signed)
Daily Session Note  Patient Details  Name: Duffy Dantonio MRN: [id number] Date of Birth: 08/05/1941 Referring Provider:     Cardiac Rehab from 06/20/2020 in [facility] Cardiac and Pulmonary Rehab  Referring Provider Aundra Dubin      Encounter Date: 08/26/2020  Check In:  Session Check In - 08/26/20 1108      Check-In   Supervising physician immediately available to respond to emergencies See telemetry face sheet for immediately available ER MD    Location [facility]-Cardiac & Pulmonary Rehab    Staff Present Heath Lark, RN, BSN, CCRP;Melissa [place]mmons RDN, LDN;Jessica [place], MA, RCEP, CCRP, CCET    Virtual Visit No    Medication changes reported     No    Fall or balance concerns reported    No    Warm-up and Cool-down Performed on first and last piece of equipment    Resistance Training Performed Yes    VAD Patient? No    PAD/SET Patient? No      Pain Assessment   Currently in Pain? No/denies              Social History   Tobacco Use  Smoking Status Former Smoker   Packs/day: 0.50   Years: 40.00   Pack years: 20.00   Quit date: 1996   Years since quitting: 25.7  Smokeless Tobacco Never Used    Goals Met:  Independence with exercise equipment Exercise tolerated well No report of cardiac concerns or symptoms  Goals Unmet:  Not Applicable  Comments: Pt able to follow exercise prescription today without complaint.  Will continue to monitor for progression.    Dr. Emily Filbert is Medical Director for [place] and [name].

## 2020-08-29 ENCOUNTER — Encounter: Payer: Medicare Other | Admitting: *Deleted

## 2020-08-29 ENCOUNTER — Other Ambulatory Visit: Payer: Self-pay

## 2020-08-29 DIAGNOSIS — I252 Old myocardial infarction: Secondary | ICD-10-CM | POA: Diagnosis not present

## 2020-08-29 DIAGNOSIS — Z951 Presence of aortocoronary bypass graft: Secondary | ICD-10-CM

## 2020-08-29 NOTE — Progress Notes (Signed)
Daily Session Note  Patient Details  Name: David Perez MRN: [id number] Date of Birth: 13-Jan-1941 Referring Provider:     Cardiac Rehab from 06/20/2020 in [facility] Cardiac and Pulmonary Rehab  Referring Provider Aundra Dubin      Encounter Date: 08/29/2020  Check In:  Session Check In - 08/29/20 0957      Check-In   Supervising physician immediately available to respond to emergencies See telemetry face sheet for immediately available ER MD    Location [facility]-Cardiac & Pulmonary Rehab    Staff Present Heath Lark, RN, BSN, Laveda Norman, BS, ACSM CEP, Exercise Physiologist;Joseph Tessie Fass RCP,RRT,BSRT    Virtual Visit No    Medication changes reported     No    Fall or balance concerns reported    No    Warm-up and Cool-down Performed on first and last piece of equipment    Resistance Training Performed Yes    VAD Patient? No    PAD/SET Patient? No      Pain Assessment   Currently in Pain? No/denies              Social History   Tobacco Use  Smoking Status Former Smoker  . Packs/day: 0.50  . Years: 40.00  . Pack years: 20.00  . Quit date: 25  . Years since quitting: 25.7  Smokeless Tobacco Never Used    Goals Met:  Independence with exercise equipment Exercise tolerated well No report of cardiac concerns or symptoms  Goals Unmet:  Not Applicable  Comments: Pt able to follow exercise prescription today without complaint.  Will continue to monitor for progression.    Dr. Emily Filbert is Medical Director for [place] and [name].

## 2020-08-31 ENCOUNTER — Encounter: Payer: Medicare Other | Admitting: *Deleted

## 2020-08-31 ENCOUNTER — Telehealth: Payer: Self-pay

## 2020-08-31 ENCOUNTER — Other Ambulatory Visit: Payer: Self-pay

## 2020-08-31 ENCOUNTER — Encounter: Payer: Self-pay | Admitting: *Deleted

## 2020-08-31 ENCOUNTER — Other Ambulatory Visit: Payer: Self-pay | Admitting: *Deleted

## 2020-08-31 DIAGNOSIS — Z951 Presence of aortocoronary bypass graft: Secondary | ICD-10-CM

## 2020-08-31 DIAGNOSIS — I5022 Chronic systolic (congestive) heart failure: Secondary | ICD-10-CM

## 2020-08-31 DIAGNOSIS — I252 Old myocardial infarction: Secondary | ICD-10-CM | POA: Diagnosis not present

## 2020-08-31 NOTE — Progress Notes (Signed)
Cardiac Individual Treatment Plan  Patient Details  Name: David Perez MRN: [id number] Date of Birth: September 13, 1941 Referring Provider:     Cardiac Rehab from 06/20/2020 in [facility] Cardiac and Pulmonary Rehab  Referring Provider Aundra Dubin      Initial Encounter Date:    Cardiac Rehab from 06/20/2020 in [facility] Cardiac and Pulmonary Rehab  Date 06/20/20      Visit Diagnosis: S/P CABG x 3  Patient's Home Medications on Admission:  Current Outpatient Medications:  .  apixaban (ELIQUIS) 2.5 MG TABS tablet, Take 1 tablet (2.5 mg total) by mouth every 12 (twelve) hours., Disp: 180 tablet, Rfl: 3 .  atorvastatin (LIPITOR) 80 MG tablet, Take 1 tablet (80 mg total) by mouth daily at 6 PM., Disp: 90 tablet, Rfl: 3 .  carvedilol (COREG) 6.25 MG tablet, Take 1 tablet (6.25 mg total) by mouth 2 (two) times daily., Disp: 60 tablet, Rfl: 11 .  megestrol (MEGACE) 20 MG tablet, Take 1 tablet (20 mg total) by mouth daily., Disp: 30 tablet, Rfl: 4 .  sacubitril-valsartan (ENTRESTO) 24-26 MG, Take 1 tablet by mouth 2 (two) times daily., Disp: 60 tablet, Rfl: 11 .  spironolactone (ALDACTONE) 25 MG tablet, Take 1 tablet (25 mg total) by mouth at bedtime., Disp: 90 tablet, Rfl: 3  Past Medical History: Past Medical History:  Diagnosis Date  . 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 Hepatitis B     Tobacco Use: Social History   Tobacco Use  Smoking Status Former Smoker  . Packs/day: 0.50  . Years: 40.00  . Pack years: 20.00  . Quit date: 44  . Years since quitting: 25.7  Smokeless Tobacco Never Used    Labs: Recent Chemical engineer    Labs for ITP Cardiac and Pulmonary Rehab Latest Ref Rng & Units 01/06/2020 01/06/2020 01/07/2020 05/10/2020 08/15/2020   Cholestrol 0 - 200 mg/dL - - - 137 122   LDLCALC 0 - 99 mg/dL - - - 76 65   HDL >39.00 mg/dL - - - 48.60 47.90   Trlycerides 0 - 149 mg/dL - - - 58.0 47.0   Hemoglobin A1c 4.8 - 5.6 % - - - - -   PHART 7.35 - 7.45 - - - - -    PCO2ART 32 - 48 mmHg - - - - -   HCO3 20.0 - 28.0 mmol/L - - - - -   TCO2 22 - 32 mmol/L - - - - -   ACIDBASEDEF 0.0 - 2.0 mmol/L - - - - -   O2SAT % 52.7 58.4 57.3 - -       Exercise Target Goals: Exercise Program Goal: Individual exercise prescription set using results from initial 6 min walk test and THRR while considering  patient's activity barriers and safety.   Exercise Prescription Goal: Initial exercise prescription builds to 30-45 minutes a day of aerobic activity, 2-3 days per week.  Home exercise guidelines will be given to patient during program as part of exercise prescription that the participant will acknowledge.   Education: Aerobic Exercise & Resistance Training: - Gives group verbal and written instruction on the various components of exercise. Focuses on aerobic and resistive training programs and the benefits of this training and how to safely progress through these programs..   Cardiac Rehab from 08/31/2020 in [facility] Cardiac and Pulmonary Rehab  Date 07/27/20  Educator AS  Instruction Review Code 1- Verbalizes Understanding  [employer] training]      Education: Exercise &  Equipment Safety: - Individual verbal instruction and demonstration of equipment use and safety with use of the equipment.   Cardiac Rehab from 08/31/2020 in [facility] Cardiac and Pulmonary Rehab  Date 06/20/20  Educator AS  Instruction Review Code 1- Verbalizes Understanding      Education: Exercise Physiology & General Exercise Guidelines: - Group verbal and written instruction with models to review the exercise physiology of the cardiovascular system and associated critical values. Provides general exercise guidelines with specific guidelines to those with heart or lung disease.    Education: Flexibility, Balance, Mind/Body Relaxation: Provides group verbal/written instruction on the benefits of flexibility and balance training, including mind/body exercise modes such as yoga, pilates and tai  chi.  Demonstration and skill practice provided.   Cardiac Rehab from 08/31/2020 in [facility] Cardiac and Pulmonary Rehab  Date 08/03/20  Educator AS  Instruction Review Code 1- Verbalizes Understanding      Activity Barriers & Risk Stratification:   6 Minute Walk:  6 Minute Walk    Row Name 06/20/20 1039         6 Minute Walk   Phase Initial     Distance 700 feet     Walk Time 6 minutes     # of Rest Breaks 0     MPH 1.32     METS 1.6     RPE 11     Perceived Dyspnea  1     VO2 Peak 5.7     Symptoms No     Resting HR 92 bpm     Resting BP 98/56     Resting Oxygen Saturation  100 %     Exercise Oxygen Saturation  during 6 min walk 98 %     Max Ex. HR 109 bpm     Max Ex. BP 104/58     2 Minute Post BP 102/56            Oxygen Initial Assessment:   Oxygen Re-Evaluation:   Oxygen Discharge (Final Oxygen Re-Evaluation):   Initial Exercise Prescription:  Initial Exercise Prescription - 06/20/20 1000      Date of Initial Exercise RX and Referring Provider   Date 06/20/20    Referring Provider Aundra Dubin      Treadmill   MPH 1    Grade 0    Minutes 15    METs 1.77      Recumbant Bike   Level 1    RPM 60    Minutes 15    METs 1.6      NuStep   Level 1    SPM 80    Minutes 15    METs 1.6      REL-XR   Level 1    Speed 50    Minutes 15    METs 1.6      Prescription Details   Frequency (times per week) 3    Duration Progress to 30 minutes of continuous aerobic without signs/symptoms of physical distress      Intensity   THRR 40-80% of Max Heartrate 110-130    Ratings of Perceived Exertion 11-13    Perceived Dyspnea 0-4           Perform Capillary Blood Glucose checks as needed.  Exercise Prescription Changes:  Exercise Prescription Changes    Row Name 06/20/20 1000 07/06/20 1300 07/20/20 1400 08/02/20 0800 08/17/20 0900     Response to Exercise   Blood Pressure (Admit) 98/56 122/60 104/56 118/58 132/58  Blood Pressure (Exercise) 104/58  118/66 112/58 116/58 136/58   Blood Pressure (Exit) 102/56 116/58 98/52 130/80 120/60   Heart Rate (Admit) 92 bpm 92 bpm 86 bpm 102 bpm 82 bpm   Heart Rate (Exercise) 109 bpm 103 bpm 103 bpm 119 bpm 90 bpm   Heart Rate (Exit) 99 bpm 90 bpm 82 bpm 78 bpm 83 bpm   Oxygen Saturation (Admit) 100 % -- -- -- --   Oxygen Saturation (Exercise) 98 % -- -- -- --   Rating of Perceived Exertion (Exercise) '11 12 13 13 13   ' Perceived Dyspnea (Exercise) 1 -- -- -- --   Symptoms none none none none none   Duration -- Progress to 30 minutes of  aerobic without signs/symptoms of physical distress Continue with 30 min of aerobic exercise without signs/symptoms of physical distress. Continue with 30 min of aerobic exercise without signs/symptoms of physical distress. Continue with 30 min of aerobic exercise without signs/symptoms of physical distress.   Intensity -- THRR unchanged THRR unchanged THRR unchanged THRR unchanged     Progression   Progression -- Continue to progress workloads to maintain intensity without signs/symptoms of physical distress. Continue to progress workloads to maintain intensity without signs/symptoms of physical distress. Continue to progress workloads to maintain intensity without signs/symptoms of physical distress. Continue to progress workloads to maintain intensity without signs/symptoms of physical distress.   Average METs -- 1.5 2.03 1.73 1.43     Resistance Training   Weight -- 5 lb 5 lb 5 lb 5 lb   Reps -- 10-15 10-15 10-15 10-15     Interval Training   Interval Training -- No No No No     Treadmill   MPH -- -- 1 0.9 1   Grade -- -- 0.5 0.5 0.5   Minutes -- -- '15 15 15   ' METs -- -- 1.8 1.8 1.83     Recumbant Bike   Level -- 3 -- -- --   Minutes -- 15 -- -- --   METs -- 2.5 -- -- --     NuStep   Level -- 1 -- -- 3   SPM -- 80 -- -- --   Minutes -- 15 -- -- 15   METs -- 1.1 -- -- 1.5     REL-XR   Level -- -- '2 1 1   ' Speed -- -- -- 50 --   Minutes -- -- '15 15  15   ' METs -- -- 2.2 1.1 1     Home Exercise Plan   Plans to continue exercise at -- -- Home (comment)  walking Home (comment)  walking Home (comment)  walking   Frequency -- -- Add 2 additional days to program exercise sessions. Add 2 additional days to program exercise sessions. Add 2 additional days to program exercise sessions.   Initial Home Exercises Provided -- -- 07/18/20 07/18/20 07/18/20   Row Name 08/30/20 0800             Response to Exercise   Blood Pressure (Admit) 134/70       Blood Pressure (Exercise) 110/68       Blood Pressure (Exit) 126/70       Heart Rate (Admit) 81 bpm       Heart Rate (Exercise) 99 bpm       Heart Rate (Exit) 90 bpm       Rating of Perceived Exertion (Exercise) 15       Symptoms none  Duration Continue with 30 min of aerobic exercise without signs/symptoms of physical distress.       Intensity THRR unchanged         Progression   Progression Continue to progress workloads to maintain intensity without signs/symptoms of physical distress.       Average METs 1.51         Resistance Training   Weight 5 lb       Reps 10-15         Interval Training   Interval Training No         Treadmill   MPH 1       Grade 0.5       Minutes 15       METs 1.83         NuStep   Level 3       SPM 80       Minutes 15       METs 1.3         REL-XR   Level 3       Minutes 15       METs 1         Home Exercise Plan   Plans to continue exercise at Home (comment)  walking       Frequency Add 2 additional days to program exercise sessions.       Initial Home Exercises Provided 07/18/20              Exercise Comments:   Exercise Goals and Review:  Exercise Goals    Row Name 06/20/20 1047             Exercise Goals   Increase Physical Activity Yes       Intervention Provide advice, education, support and counseling about physical activity/exercise needs.;Develop an individualized exercise prescription for aerobic and resistive  training based on initial evaluation findings, risk stratification, comorbidities and participant's personal goals.       Expected Outcomes Short Term: Attend rehab on a regular basis to increase amount of physical activity.;Long Term: Add in home exercise to make exercise part of routine and to increase amount of physical activity.;Long Term: Exercising regularly at least 3-5 days a week.       Increase Strength and Stamina Yes       Intervention Provide advice, education, support and counseling about physical activity/exercise needs.;Develop an individualized exercise prescription for aerobic and resistive training based on initial evaluation findings, risk stratification, comorbidities and participant's personal goals.       Expected Outcomes Short Term: Increase workloads from initial exercise prescription for resistance, speed, and METs.;Short Term: Perform resistance training exercises routinely during rehab and add in resistance training at home;Long Term: Improve cardiorespiratory fitness, muscular endurance and strength as measured by increased METs and functional capacity (6MWT)       Able to understand and use rate of perceived exertion (RPE) scale Yes       Intervention Provide education and explanation on how to use RPE scale       Expected Outcomes Short Term: Able to use RPE daily in rehab to express subjective intensity level;Long Term:  Able to use RPE to guide intensity level when exercising independently       Knowledge and understanding of Target Heart Rate Range (THRR) Yes       Intervention Provide education and explanation of THRR including how the numbers were predicted and where they are located for reference  Expected Outcomes Short Term: Able to state/look up THRR;Short Term: Able to use daily as guideline for intensity in rehab;Long Term: Able to use THRR to govern intensity when exercising independently       Able to check pulse independently Yes       Intervention  Provide education and demonstration on how to check pulse in carotid and radial arteries.;Review the importance of being able to check your own pulse for safety during independent exercise       Expected Outcomes Short Term: Able to explain why pulse checking is important during independent exercise;Long Term: Able to check pulse independently and accurately       Understanding of Exercise Prescription Yes       Intervention Provide education, explanation, and written materials on patient's individual exercise prescription       Expected Outcomes Short Term: Able to explain program exercise prescription;Long Term: Able to explain home exercise prescription to exercise independently              Exercise Goals Re-Evaluation :  Exercise Goals Re-Evaluation    Row Name 06/22/20 8032 07/06/20 1357 07/18/20 0947 07/20/20 1453 08/02/20 0901     Exercise Goal Re-Evaluation   Exercise Goals Review Increase Physical Activity;Able to understand and use rate of perceived exertion (RPE) scale;Knowledge and understanding of Target Heart Rate Range (THRR);Understanding of Exercise Prescription;Increase Strength and Stamina;Able to check pulse independently Increase Physical Activity;Able to understand and use rate of perceived exertion (RPE) scale;Knowledge and understanding of Target Heart Rate Range (THRR);Understanding of Exercise Prescription;Increase Strength and Stamina;Able to check pulse independently Increase Physical Activity;Increase Strength and Stamina;Able to understand and use rate of perceived exertion (RPE) scale;Knowledge and understanding of Target Heart Rate Range (THRR);Able to check pulse independently;Understanding of Exercise Prescription Increase Physical Activity;Increase Strength and Stamina;Understanding of Exercise Prescription Increase Physical Activity;Increase Strength and Stamina;Understanding of Exercise Prescription   Comments Reviewed RPE and dyspnea scales, THR and program  prescription with pt today.  Pt voiced understanding and was given a copy of goals to take home. Al is tolerating exercise well.  Staff is working with him on being comfortable on the recumbent bike. Reviewed home exercise with pt today.  Pt plans to walk and use weights for exercise.  Reviewed THR, pulse, RPE, sign and symptoms, pulse oximetery and when to call 911 or MD.  Also discussed weather considerations and indoor options.  Pt voiced understanding. Al is doing well in rehab.  He is now on level 2 for the XR.  We will continue to monitor his progress. Al attends consistently and has tried different levels on machines.  He felt incline on TM can make him very tired.  Staff will encourage him to increase level on XR and continue to slowly increase TM.   Expected Outcomes Short: Use RPE daily to regulate intensity. Long: Follow program prescription in THR. Short:  continue to attend consistently Long:  build overall strength and stamina Short:  monitor vitals while exercising at home Long:  build overall strength and stamina Short: Continue to increase workloads Long: Continue to build stamina. Short: increase workload on XR Long: build strength and stamina   Row Name 08/15/20 0954 08/17/20 0953 08/30/20 0838         Exercise Goal Re-Evaluation   Exercise Goals Review Increase Physical Activity;Increase Strength and Stamina;Understanding of Exercise Prescription Increase Physical Activity;Increase Strength and Stamina;Understanding of Exercise Prescription Increase Physical Activity;Increase Strength and Stamina;Understanding of Exercise Prescription     Comments  Al felt ok doing 1 mph and .5% grade today.  He is on level 3 on NS.  He is still doing some strength training at home. Al has been doing well in rehab. He has now had a chance to try all the different machines we have.  He found the only one he couldn't do was the ellipitcal.  He is nearing graduation and should improve on his walk test.  We  will continue to montior his progress. Al is nearing graduation. He has increased to level 3 on the XR. He has been preferring the treadmill for primary mode of exercise and tolerates it well. Will continue to monitor.     Expected Outcomes Short: continue to progress workloads Long: build overall stamina Short: improve post 6MWT Long: Continue to improve stamina. Short: Graduate Long: Progress MET level and overall strength/endurance            Discharge Exercise Prescription (Final Exercise Prescription Changes):  Exercise Prescription Changes - 08/30/20 0800      Response to Exercise   Blood Pressure (Admit) 134/70    Blood Pressure (Exercise) 110/68    Blood Pressure (Exit) 126/70    Heart Rate (Admit) 81 bpm    Heart Rate (Exercise) 99 bpm    Heart Rate (Exit) 90 bpm    Rating of Perceived Exertion (Exercise) 15    Symptoms none    Duration Continue with 30 min of aerobic exercise without signs/symptoms of physical distress.    Intensity THRR unchanged      Progression   Progression Continue to progress workloads to maintain intensity without signs/symptoms of physical distress.    Average METs 1.51      Resistance Training   Weight 5 lb    Reps 10-15      Interval Training   Interval Training No      Treadmill   MPH 1    Grade 0.5    Minutes 15    METs 1.83      NuStep   Level 3    SPM 80    Minutes 15    METs 1.3      REL-XR   Level 3    Minutes 15    METs 1      Home Exercise Plan   Plans to continue exercise at Home (comment)   walking   Frequency Add 2 additional days to program exercise sessions.    Initial Home Exercises Provided 07/18/20           Nutrition:  Target Goals: Understanding of nutrition guidelines, daily intake of sodium <1535m, cholesterol <2038m calories 30% from fat and 7% or less from saturated fats, daily to have 5 or more servings of fruits and vegetables.  Education: Controlling Sodium/Reading Food Labels -Group verbal  and written material supporting the discussion of sodium use in heart healthy nutrition. Review and explanation with models, verbal and written materials for utilization of the food label.   Education: General Nutrition Guidelines/Fats and Fiber: -Group instruction provided by verbal, written material, models and posters to present the general guidelines for heart healthy nutrition. Gives an explanation and review of dietary fats and fiber.   Biometrics:    Nutrition Therapy Plan and Nutrition Goals:  Nutrition Therapy & Goals - 07/04/20 1019      Nutrition Therapy   Diet Heart healthy, low sodium    Protein (specify units) 50g    Fiber 30 grams    Whole Grain Foods 3  servings    Saturated Fats 12 max. grams    Fruits and Vegetables 5 servings/day    Sodium 1.5 grams      Personal Nutrition Goals   Nutrition Goal ST: try easy to prep meals disucssed, add peanut butter to snack LT: Gain muscle    Comments Lifts 8 lb handweights at home. eats more often and says it helps more with digestion and helps hime eat more. B: soft boiled eggs (2) with whole wheat toast or special K protein cereal with skim milk - will have coffee (sweet and low) or juice. Puts protein powder in skim milk or juice. He doesn't drink whole milk because it makes him congested. He eats soft foods because he has a hard time chewing. S: fruit (blueberries or banana) L: chicken wings and macaroni salad. D:hamburger from [employer]. He doesn't cook and his wife has passed. Discussed heart healthy eating and easy low prep meals such as rotisserie chicken (with vegetables and grains/potato, with pasta, in soup with low sodium brtoh and frozen veggies, chicken salad), pasta or bean salad using canned or pre-cut vegetables, or pasta with frozen broccoli, jarred sauce, and beans or chicken.      Intervention Plan   Intervention Prescribe, educate and counsel regarding individualized specific dietary modifications aiming towards  targeted core components such as weight, hypertension, lipid management, diabetes, heart failure and other comorbidities.;Nutrition handout(s) given to patient.    Expected Outcomes Short Term Goal: Understand basic principles of dietary content, such as calories, fat, sodium, cholesterol and nutrients.;Short Term Goal: A plan has been developed with personal nutrition goals set during dietitian appointment.;Long Term Goal: Adherence to prescribed nutrition plan.           Nutrition Assessments:  Nutrition Assessments - 06/20/20 1058      MEDFICTS Scores   Pre Score 54           MEDIFICTS Score Key:          ?70 Need to make dietary changes          40-70 Heart Healthy Diet         ? 40 Therapeutic Level Cholesterol Diet  Nutrition Goals Re-Evaluation:  Nutrition Goals Re-Evaluation    [place] Name 07/18/20 0948 08/17/20 0919           Goals   Nutrition Goal ST: try easy to prep meals disucssed, add peanut butter to snack LT: Gain muscle ST: Continue to include more home prepped meals to reduce sodium LT: gain muscle, maintain weight      Comment Pt reports not having yet tried ideas, will do so within next goal period. Pt is getting ready to graduate and is at 28 visits. 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 Pt has been preparing some simple meals at home, but admits its sporatic. Pt reports his weight has been stable and reports added chicken has been helping with his strength. He will continue to add in more meals he prepared himself, but doesn't seem confident in doing so - encouraged pt that he has made progress and to take his goals slow after leaving [name] at rehab.      Expected Outcome ST: try easy to prep meals disucssed, add peanut butter to snack LT: Gain muscle Continue to include more home prepped meals to reduce sodium - stay motivated after rehab             Nutrition Goals Discharge (Final Nutrition Goals Re-Evaluation):  Nutrition Goals  Re-Evaluation - 08/17/20 0919      Goals   Nutrition Goal ST: Continue to include more home prepped meals to reduce sodium LT: gain muscle, maintain weight    Comment Pt is getting ready to graduate and is at 28 visits. 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 Pt has been preparing some simple meals at home, but admits its sporatic. Pt reports his weight has been stable and reports added chicken has been helping with his strength. He will continue to add in more meals he prepared himself, but doesn't seem confident in doing so - encouraged pt that he has made progress and to take his goals slow after leaving [name] at rehab.    Expected Outcome Continue to include more home prepped meals to reduce sodium - stay motivated after rehab           Psychosocial: Target Goals: Acknowledge presence or absence of significant depression and/or stress, maximize coping skills, provide positive support system. Participant is able to verbalize types and ability to use techniques and skills needed for reducing stress and depression.   Education: Depression - Provides group verbal and written instruction on the correlation between heart/lung disease and depressed mood, treatment options, and the stigmas associated with seeking treatment.   Cardiac Rehab from 08/31/2020 in [facility] Cardiac and Pulmonary Rehab  Date 07/06/20  Educator SB  Instruction Review Code 1- [employer] Understanding      Education: Sleep Hygiene -Provides group verbal and written instruction about how sleep can affect your health.  Define sleep hygiene, discuss sleep cycles and impact of sleep habits. Review good sleep hygiene tips.     Education: Stress and Anxiety: - Provides group verbal and written instruction about the health risks of elevated stress and causes of high stress.  Discuss the correlation between heart/lung disease and anxiety and treatment options. Review healthy ways to manage with stress and  anxiety.    Initial Review & Psychosocial Screening:  Initial Psych Review & Screening - 06/14/20 0942      Initial Review   Current issues with None Identified    Source of Stress Concerns None Identified      Family Dynamics   Good Support System? Yes    Comments He look to his daughter for support.      Barriers   Psychosocial barriers to participate in program The patient should benefit from training in stress management and relaxation.;There are no identifiable barriers or psychosocial needs.      Screening Interventions   Interventions Provide feedback about the scores to participant;Encouraged to exercise;To provide support and resources with identified psychosocial needs    Expected Outcomes Short Term goal: Utilizing psychosocial counselor, staff and physician to assist with identification of specific Stressors or current issues interfering with healing process. Setting desired goal for each stressor or current issue identified.;Long Term Goal: Stressors or current issues are controlled or eliminated.;Short Term goal: Identification and review with participant of any Quality of Life or Depression concerns found by scoring the questionnaire.;Long Term goal: The participant improves quality of Life and PHQ9 Scores as seen by post scores and/or verbalization of changes           Quality of Life Scores:   Quality of Life - 06/20/20 1056      Quality of Life   Select Quality of Life      Quality of Life Scores   Health/Function Pre 23.5 %  Socioeconomic Pre 27.5 %    Psych/Spiritual Pre 26.79 %    Family Pre 29.38 %    GLOBAL Pre 25.81 %          Scores of 19 and below usually indicate a poorer quality of life in these areas.  A difference of  2-3 points is a clinically meaningful difference.  A difference of 2-3 points in the total score of the Quality of Life Index has been associated with significant improvement in overall quality of life, self-image, physical  symptoms, and general health in studies assessing change in quality of life.  PHQ-9: Recent Review Flowsheet Data    Depression screen [facility] 2/9 08/15/2020 06/29/2020 06/20/2020 05/10/2020   Decreased Interest 0 0 0 0   Down, Depressed, Hopeless 0 0 0 0   PHQ - 2 Score 0 0 0 0   Altered sleeping - 0 0 0   Tired, decreased energy - 0 0 0   Change in appetite - 0 0 0   Feeling bad or failure about yourself  - 0 0 0   Trouble concentrating - 0 0 0   Moving slowly or fidgety/restless - 0 0 0   Suicidal thoughts - 0 0 0   PHQ-9 Score - 0 0 0   Difficult doing work/chores - - - Not difficult at all     Interpretation of Total Score  Total Score Depression Severity:  1-4 = Minimal depression, 5-9 = Mild depression, 10-14 = Moderate depression, 15-19 = Moderately severe depression, 20-27 = Severe depression   Psychosocial Evaluation and Intervention:  Psychosocial Evaluation - 06/14/20 0946      Psychosocial Evaluation & Interventions   Interventions Encouraged to exercise with the program and follow exercise prescription    Comments Patiet states that he has no mental issues at this time.    Expected Outcomes Short: Exercise regularly to support mental health and notify staff of any changes. Long: maintain mental health and well being through teaching of rehab or prescribed medications independently.    Continue Psychosocial Services  Follow up required by staff           Psychosocial Re-Evaluation:  Psychosocial Re-Evaluation    [place] Name 07/18/20 0930 08/15/20 0956           Psychosocial Re-Evaluation   Current issues with Current Stress Concerns Current Stress Concerns      Comments Al has no stress or sleep concerns at this time. No stress or sleep concerns.      Expected Outcomes Short:  attend cardiac rehab for the exercise Long: maintain positive outlook Short: continue to exercise Long: stay positive             Psychosocial Discharge (Final Psychosocial Re-Evaluation):   Psychosocial Re-Evaluation - 08/15/20 0956      Psychosocial Re-Evaluation   Current issues with Current Stress Concerns    Comments No stress or sleep concerns.    Expected Outcomes Short: continue to exercise Long: stay positive           Vocational Rehabilitation: Provide vocational rehab assistance to qualifying candidates.   Vocational Rehab Evaluation & Intervention:   Education: Education Goals: Education classes will be provided on a variety of topics geared toward better understanding of heart health and risk factor modification. Participant will state understanding/return demonstration of topics presented as noted by education test scores.  Learning Barriers/Preferences:  Learning Barriers/Preferences - 06/14/20 0936      Learning Barriers/Preferences   Learning  Barriers None    Learning Preferences None           General Cardiac Education Topics:  AED/CPR: - Group verbal and written instruction with the use of models to demonstrate the basic use of the AED with the basic ABC's of resuscitation.   Anatomy & Physiology of the Heart: - Group verbal and written instruction and models provide basic cardiac anatomy and physiology, with the coronary electrical and arterial systems. Review of Valvular disease and Heart Failure   Cardiac Procedures: - Group verbal and written instruction to review commonly prescribed medications for heart disease. Reviews the medication, class of the drug, and side effects. Includes the steps to properly store meds and maintain the prescription regimen. (beta blockers and nitrates)   Cardiac Rehab from 08/31/2020 in [facility] Cardiac and Pulmonary Rehab  Date 07/27/20  Educator SB  Instruction Review Code 1- Verbalizes Understanding      Cardiac Medications I: - Group verbal and written instruction to review commonly prescribed medications for heart disease. Reviews the medication, class of the drug, and side effects. Includes the steps  to properly store meds and maintain the prescription regimen.   Cardiac Rehab from 08/31/2020 in [facility] Cardiac and Pulmonary Rehab  Date 08/17/20  Educator SB  Instruction Review Code 1- Verbalizes Understanding      Cardiac Medications II: -Group verbal and written instruction to review commonly prescribed medications for heart disease. Reviews the medication, class of the drug, and side effects. (all other drug classes)   Cardiac Rehab from 08/31/2020 in [facility] Cardiac and Pulmonary Rehab  Date 07/13/20  Educator [facility]  Instruction Review Code 1- Verbalizes Understanding       Go Sex-Intimacy & Heart Disease, Get SMART - Goal Setting: - Group verbal and written instruction through game format to discuss heart disease and the return to sexual intimacy. Provides group verbal and written material to discuss and apply goal setting through the application of the S.M.A.R.T. Method.   Cardiac Rehab from 08/31/2020 in [facility] Cardiac and Pulmonary Rehab  Date 07/27/20  Educator SB  Instruction Review Code 1- Verbalizes Understanding      Other Matters of the Heart: - Provides group verbal, written materials and models to describe Stable Angina and Peripheral Artery. Includes description of the disease process and treatment options available to the cardiac patient.   Infection Prevention: - Provides verbal and written material to individual with discussion of infection control including proper hand washing and proper equipment cleaning during exercise session.   Cardiac Rehab from 08/31/2020 in [facility] Cardiac and Pulmonary Rehab  Date 06/20/20  Educator AS  Instruction Review Code 1- Verbalizes Understanding      Falls Prevention: - Provides verbal and written material to individual with discussion of falls prevention and safety.   Cardiac Rehab from 08/31/2020 in [facility] Cardiac and Pulmonary Rehab  Date 06/20/20  Educator AS  Instruction Review Code 1- Verbalizes Understanding       Other: -Provides group and verbal instruction on various topics (see comments)   Knowledge Questionnaire Score:   Core Components/Risk Factors/Patient Goals at Admission:  Personal Goals and Risk Factors at Admission - 06/20/20 1055      Core Components/Risk Factors/Patient Goals on Admission    Weight Management Yes;Weight Gain    Intervention Weight Management: Develop a combined nutrition and exercise program designed to reach desired caloric intake, while maintaining appropriate intake of nutrient and fiber, sodium and fats, and appropriate energy expenditure required for the weight goal.;Weight  Management: Provide education and appropriate resources to help participant work on and attain dietary goals.;Weight Management/Obesity: Establish reasonable short term and long term weight goals.    Admit Weight 113 lb 6.4 oz (51.4 kg)    Goal Weight: Short Term 120 lb (54.4 kg)    Goal Weight: Long Term 125 lb (56.7 kg)    Expected Outcomes Short Term: Continue to assess and modify interventions until short term weight is achieved;Long Term: Adherence to nutrition and physical activity/exercise program aimed toward attainment of established weight goal;Weight Maintenance: Understanding of the daily nutrition guidelines, which includes 25-35% calories from fat, 7% or less cal from saturated fats, less than 253m cholesterol, less than 1.5gm of sodium, & 5 or more servings of fruits and vegetables daily;Understanding recommendations for meals to include 15-35% energy as protein, 25-35% energy from fat, 35-60% energy from carbohydrates, less than 2088mof dietary cholesterol, 20-35 gm of total fiber daily;Understanding of distribution of calorie intake throughout the day with the consumption of 4-5 meals/snacks;Weight Gain: Understanding of general recommendations for a high calorie, high protein meal plan that promotes weight gain by distributing calorie intake throughout the day with the  consumption for 4-5 meals, snacks, and/or supplements    Intervention Provide a combined exercise and nutrition program that is supplemented with education, support and counseling about heart failure. Directed toward relieving symptoms such as shortness of breath, decreased exercise tolerance, and extremity edema.    Expected Outcomes Improve functional capacity of life;Short term: Daily weights obtained and reported for increase. Utilizing diuretic protocols set by physician.;Short term: Attendance in program 2-3 days a week with increased exercise capacity. Reported lower sodium intake. Reported increased fruit and vegetable intake. Reports medication compliance.;Long term: Adoption of self-care skills and reduction of barriers for early signs and symptoms recognition and intervention leading to self-care maintenance.    Intervention Provide education and support for participant on nutrition & aerobic/resistive exercise along with prescribed medications to achieve LDL <7081mHDL >27m73m  Expected Outcomes Short Term: Participant states understanding of desired cholesterol values and is compliant with medications prescribed. Participant is following exercise prescription and nutrition guidelines.;Long Term: Cholesterol controlled with medications as prescribed, with individualized exercise RX and with personalized nutrition plan. Value goals: LDL < 70mg54mL > 40 mg.           Education:Diabetes - Individual verbal and written instruction to review signs/symptoms of diabetes, desired ranges of glucose level fasting, after meals and with exercise. Acknowledge that pre and post exercise glucose checks will be done for 3 sessions at entry of program.   Education: Know Your Numbers and Risk Factors: -Group verbal and written instruction about important numbers in your health.  Discussion of what are risk factors and how they play a role in the disease process.  Review of Cholesterol, Blood Pressure,  Diabetes, and BMI and the role they play in your overall health.   [facility]  Date 07/13/20  [facility]- Verbal[facility] Understanding      Core Components/Risk Factors/Patient Goals Review:   Goals and Risk Factor Review    Row Name 07/18/20 0927 [id number]           Core Components/Risk Factors/Patient Goals Review   Personal Goals Review Weight Management/Obesity;Hypertension;Heart Failure;Lipids Weight Management/Obesity;Hypertension;Heart Failure;Lipids      Review Al says he can come in feeling sluggish and leave "feeling like a million bucks". He reports  taking all medications as directed.   He has no had any heart failure symptoms. Al is taking meds as directed.  He does feel a little tired today.  He has not had any HF symptoms.  He can do day to day activities without fatigue.  He sees his Dr today.      Expected Outcomes Short: continue to take meds and attend consistently Long:  manage risk factors Short:  conitnue current exercise and medication regimen Long: keep risk factors under control             Core Components/Risk Factors/Patient Goals at Discharge (Final Review):   Goals and Risk Factor Review - 08/15/20 0951      Core Components/Risk Factors/Patient Goals Review   Personal Goals Review Weight Management/Obesity;Hypertension;Heart Failure;Lipids    Review Al is taking meds as directed.  He does feel a little tired today.  He has not had any HF symptoms.  He can do day to day activities without fatigue.  He sees his Dr today.    Expected Outcomes Short:  conitnue current exercise and medication regimen Long: keep risk factors under control           ITP Comments:  ITP Comments    Row Name 06/14/20 0949 06/20/20 1102 06/22/20 0938 07/13/20 0622 07/25/20 1005   ITP Comments Virtual Visit completed. Patient informed on EP and RD appointment and 6 Minute walk test. Patient also  informed of patient health questionnaires on My Chart. Patient Verbalizes understanding. Visit diagnosis can be found in [facility] 12/31/2019. Completed 6MWT and gym orientation. Initial ITP created and sent for review to Dr. Emily Filbert, Medical Director. First full day of exercise!  Patient was oriented to gym and equipment including functions, settings, policies, and procedures.  Patient's individual exercise prescription and treatment plan were reviewed.  All starting workloads were established based on the results of the 6 minute walk test done at initial orientation visit.  The plan for exercise progression was also introduced and progression will be customized based on patient's performance and goals 30 Day review completed. Medical Director ITP review done, changes made as directed, and signed approval by Medical Director. Al is concerned that today his breathing is harder than ususal. He has no weight gain noted. Med changes occured early this month He was advised totalk to his physician about the concern. He verbalized understanding that he should call his physician.   [place] Name 08/10/20 1601           ITP Comments 30 day review completed. ITP sent to Dr. Emily Filbert, Medical Director of Cardiac and Pulmonary Rehab. Continue with ITP unless changes are made by physician.              Comments: Discharge ITP

## 2020-08-31 NOTE — Progress Notes (Signed)
Cardiac Individual Treatment Plan  Patient Details  Name: David Perez MRN: [id number] Date of Birth: 1941/01/14 Referring Provider:     Cardiac Rehab from 06/20/2020 in [facility] Cardiac and Pulmonary Rehab  Referring Provider Aundra Dubin      Initial Encounter Date:    Cardiac Rehab from 06/20/2020 in [facility] Cardiac and Pulmonary Rehab  Date 06/20/20      Visit Diagnosis: S/P CABG x 3  Patient's Home Medications on Admission:  Current Outpatient Medications:  .  apixaban (ELIQUIS) 2.5 MG TABS tablet, Take 1 tablet (2.5 mg total) by mouth every 12 (twelve) hours., Disp: 180 tablet, Rfl: 3 .  atorvastatin (LIPITOR) 80 MG tablet, Take 1 tablet (80 mg total) by mouth daily at 6 PM., Disp: 90 tablet, Rfl: 3 .  carvedilol (COREG) 6.25 MG tablet, Take 1 tablet (6.25 mg total) by mouth 2 (two) times daily., Disp: 60 tablet, Rfl: 11 .  megestrol (MEGACE) 20 MG tablet, Take 1 tablet (20 mg total) by mouth daily., Disp: 30 tablet, Rfl: 4 .  sacubitril-valsartan (ENTRESTO) 24-26 MG, Take 1 tablet by mouth 2 (two) times daily., Disp: 60 tablet, Rfl: 11 .  spironolactone (ALDACTONE) 25 MG tablet, Take 1 tablet (25 mg total) by mouth at bedtime., Disp: 90 tablet, Rfl: 3  Past Medical History: Past Medical History:  Diagnosis Date  . 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 Hepatitis B     Tobacco Use: Social History   Tobacco Use  Smoking Status Former Smoker  . Packs/day: 0.50  . Years: 40.00  . Pack years: 20.00  . Quit date: 6  . Years since quitting: 25.7  Smokeless Tobacco Never Used    Labs: Recent Chemical engineer    Labs for ITP Cardiac and Pulmonary Rehab Latest Ref Rng & Units 01/06/2020 01/06/2020 01/07/2020 05/10/2020 08/15/2020   Cholestrol 0 - 200 mg/dL - - - 137 122   LDLCALC 0 - 99 mg/dL - - - 76 65   HDL >39.00 mg/dL - - - 48.60 47.90   Trlycerides 0 - 149 mg/dL - - - 58.0 47.0   Hemoglobin A1c 4.8 - 5.6 % - - - - -   PHART 7.35 - 7.45 - - - - -    PCO2ART 32 - 48 mmHg - - - - -   HCO3 20.0 - 28.0 mmol/L - - - - -   TCO2 22 - 32 mmol/L - - - - -   ACIDBASEDEF 0.0 - 2.0 mmol/L - - - - -   O2SAT % 52.7 58.4 57.3 - -       Exercise Target Goals: Exercise Program Goal: Individual exercise prescription set using results from initial 6 min walk test and THRR while considering  patient's activity barriers and safety.   Exercise Prescription Goal: Initial exercise prescription builds to 30-45 minutes a day of aerobic activity, 2-3 days per week.  Home exercise guidelines will be given to patient during program as part of exercise prescription that the participant will acknowledge.   Education: Aerobic Exercise & Resistance Training: - Gives group verbal and written instruction on the various components of exercise. Focuses on aerobic and resistive training programs and the benefits of this training and how to safely progress through these programs..   Cardiac Rehab from 08/31/2020 in [facility] Cardiac and Pulmonary Rehab  Date 07/27/20  Educator AS  Instruction Review Code 1- Verbalizes Understanding  [employer] training]      Education: Exercise &  Equipment Safety: - Individual verbal instruction and demonstration of equipment use and safety with use of the equipment.   Cardiac Rehab from 08/31/2020 in [facility] Cardiac and Pulmonary Rehab  Date 06/20/20  Educator AS  Instruction Review Code 1- Verbalizes Understanding      Education: Exercise Physiology & General Exercise Guidelines: - Group verbal and written instruction with models to review the exercise physiology of the cardiovascular system and associated critical values. Provides general exercise guidelines with specific guidelines to those with heart or lung disease.    Education: Flexibility, Balance, Mind/Body Relaxation: Provides group verbal/written instruction on the benefits of flexibility and balance training, including mind/body exercise modes such as yoga, pilates and tai  chi.  Demonstration and skill practice provided.   Cardiac Rehab from 08/31/2020 in [facility] Cardiac and Pulmonary Rehab  Date 08/03/20  Educator AS  Instruction Review Code 1- Verbalizes Understanding      Activity Barriers & Risk Stratification:   6 Minute Walk:  6 Minute Walk    Row Name 06/20/20 1039         6 Minute Walk   Phase Initial     Distance 700 feet     Walk Time 6 minutes     # of Rest Breaks 0     MPH 1.32     METS 1.6     RPE 11     Perceived Dyspnea  1     VO2 Peak 5.7     Symptoms No     Resting HR 92 bpm     Resting BP 98/56     Resting Oxygen Saturation  100 %     Exercise Oxygen Saturation  during 6 min walk 98 %     Max Ex. HR 109 bpm     Max Ex. BP 104/58     2 Minute Post BP 102/56            Oxygen Initial Assessment:   Oxygen Re-Evaluation:   Oxygen Discharge (Final Oxygen Re-Evaluation):   Initial Exercise Prescription:  Initial Exercise Prescription - 06/20/20 1000      Date of Initial Exercise RX and Referring Provider   Date 06/20/20    Referring Provider Aundra Dubin      Treadmill   MPH 1    Grade 0    Minutes 15    METs 1.77      Recumbant Bike   Level 1    RPM 60    Minutes 15    METs 1.6      NuStep   Level 1    SPM 80    Minutes 15    METs 1.6      REL-XR   Level 1    Speed 50    Minutes 15    METs 1.6      Prescription Details   Frequency (times per week) 3    Duration Progress to 30 minutes of continuous aerobic without signs/symptoms of physical distress      Intensity   THRR 40-80% of Max Heartrate 110-130    Ratings of Perceived Exertion 11-13    Perceived Dyspnea 0-4           Perform Capillary Blood Glucose checks as needed.  Exercise Prescription Changes:  Exercise Prescription Changes    Row Name 06/20/20 1000 07/06/20 1300 07/20/20 1400 08/02/20 0800 08/17/20 0900     Response to Exercise   Blood Pressure (Admit) 98/56 122/60 104/56 118/58 132/58  Blood Pressure (Exercise) 104/58  118/66 112/58 116/58 136/58   Blood Pressure (Exit) 102/56 116/58 98/52 130/80 120/60   Heart Rate (Admit) 92 bpm 92 bpm 86 bpm 102 bpm 82 bpm   Heart Rate (Exercise) 109 bpm 103 bpm 103 bpm 119 bpm 90 bpm   Heart Rate (Exit) 99 bpm 90 bpm 82 bpm 78 bpm 83 bpm   Oxygen Saturation (Admit) 100 % -- -- -- --   Oxygen Saturation (Exercise) 98 % -- -- -- --   Rating of Perceived Exertion (Exercise) '11 12 13 13 13   ' Perceived Dyspnea (Exercise) 1 -- -- -- --   Symptoms none none none none none   Duration -- Progress to 30 minutes of  aerobic without signs/symptoms of physical distress Continue with 30 min of aerobic exercise without signs/symptoms of physical distress. Continue with 30 min of aerobic exercise without signs/symptoms of physical distress. Continue with 30 min of aerobic exercise without signs/symptoms of physical distress.   Intensity -- THRR unchanged THRR unchanged THRR unchanged THRR unchanged     Progression   Progression -- Continue to progress workloads to maintain intensity without signs/symptoms of physical distress. Continue to progress workloads to maintain intensity without signs/symptoms of physical distress. Continue to progress workloads to maintain intensity without signs/symptoms of physical distress. Continue to progress workloads to maintain intensity without signs/symptoms of physical distress.   Average METs -- 1.5 2.03 1.73 1.43     Resistance Training   Weight -- 5 lb 5 lb 5 lb 5 lb   Reps -- 10-15 10-15 10-15 10-15     Interval Training   Interval Training -- No No No No     Treadmill   MPH -- -- 1 0.9 1   Grade -- -- 0.5 0.5 0.5   Minutes -- -- '15 15 15   ' METs -- -- 1.8 1.8 1.83     Recumbant Bike   Level -- 3 -- -- --   Minutes -- 15 -- -- --   METs -- 2.5 -- -- --     NuStep   Level -- 1 -- -- 3   SPM -- 80 -- -- --   Minutes -- 15 -- -- 15   METs -- 1.1 -- -- 1.5     REL-XR   Level -- -- '2 1 1   ' Speed -- -- -- 50 --   Minutes -- -- '15 15  15   ' METs -- -- 2.2 1.1 1     Home Exercise Plan   Plans to continue exercise at -- -- Home (comment)  walking Home (comment)  walking Home (comment)  walking   Frequency -- -- Add 2 additional days to program exercise sessions. Add 2 additional days to program exercise sessions. Add 2 additional days to program exercise sessions.   Initial Home Exercises Provided -- -- 07/18/20 07/18/20 07/18/20   Row Name 08/30/20 0800             Response to Exercise   Blood Pressure (Admit) 134/70       Blood Pressure (Exercise) 110/68       Blood Pressure (Exit) 126/70       Heart Rate (Admit) 81 bpm       Heart Rate (Exercise) 99 bpm       Heart Rate (Exit) 90 bpm       Rating of Perceived Exertion (Exercise) 15       Symptoms none  Duration Continue with 30 min of aerobic exercise without signs/symptoms of physical distress.       Intensity THRR unchanged         Progression   Progression Continue to progress workloads to maintain intensity without signs/symptoms of physical distress.       Average METs 1.51         Resistance Training   Weight 5 lb       Reps 10-15         Interval Training   Interval Training No         Treadmill   MPH 1       Grade 0.5       Minutes 15       METs 1.83         NuStep   Level 3       SPM 80       Minutes 15       METs 1.3         REL-XR   Level 3       Minutes 15       METs 1         Home Exercise Plan   Plans to continue exercise at Home (comment)  walking       Frequency Add 2 additional days to program exercise sessions.       Initial Home Exercises Provided 07/18/20              Exercise Comments:   Exercise Goals and Review:  Exercise Goals    Row Name 06/20/20 1047             Exercise Goals   Increase Physical Activity Yes       Intervention Provide advice, education, support and counseling about physical activity/exercise needs.;Develop an individualized exercise prescription for aerobic and resistive  training based on initial evaluation findings, risk stratification, comorbidities and participant's personal goals.       Expected Outcomes Short Term: Attend rehab on a regular basis to increase amount of physical activity.;Long Term: Add in home exercise to make exercise part of routine and to increase amount of physical activity.;Long Term: Exercising regularly at least 3-5 days a week.       Increase Strength and Stamina Yes       Intervention Provide advice, education, support and counseling about physical activity/exercise needs.;Develop an individualized exercise prescription for aerobic and resistive training based on initial evaluation findings, risk stratification, comorbidities and participant's personal goals.       Expected Outcomes Short Term: Increase workloads from initial exercise prescription for resistance, speed, and METs.;Short Term: Perform resistance training exercises routinely during rehab and add in resistance training at home;Long Term: Improve cardiorespiratory fitness, muscular endurance and strength as measured by increased METs and functional capacity (6MWT)       Able to understand and use rate of perceived exertion (RPE) scale Yes       Intervention Provide education and explanation on how to use RPE scale       Expected Outcomes Short Term: Able to use RPE daily in rehab to express subjective intensity level;Long Term:  Able to use RPE to guide intensity level when exercising independently       Knowledge and understanding of Target Heart Rate Range (THRR) Yes       Intervention Provide education and explanation of THRR including how the numbers were predicted and where they are located for reference  Expected Outcomes Short Term: Able to state/look up THRR;Short Term: Able to use daily as guideline for intensity in rehab;Long Term: Able to use THRR to govern intensity when exercising independently       Able to check pulse independently Yes       Intervention  Provide education and demonstration on how to check pulse in carotid and radial arteries.;Review the importance of being able to check your own pulse for safety during independent exercise       Expected Outcomes Short Term: Able to explain why pulse checking is important during independent exercise;Long Term: Able to check pulse independently and accurately       Understanding of Exercise Prescription Yes       Intervention Provide education, explanation, and written materials on patient's individual exercise prescription       Expected Outcomes Short Term: Able to explain program exercise prescription;Long Term: Able to explain home exercise prescription to exercise independently              Exercise Goals Re-Evaluation :  Exercise Goals Re-Evaluation    Row Name 06/22/20 7619 07/06/20 1357 07/18/20 0947 07/20/20 1453 08/02/20 0901     Exercise Goal Re-Evaluation   Exercise Goals Review Increase Physical Activity;Able to understand and use rate of perceived exertion (RPE) scale;Knowledge and understanding of Target Heart Rate Range (THRR);Understanding of Exercise Prescription;Increase Strength and Stamina;Able to check pulse independently Increase Physical Activity;Able to understand and use rate of perceived exertion (RPE) scale;Knowledge and understanding of Target Heart Rate Range (THRR);Understanding of Exercise Prescription;Increase Strength and Stamina;Able to check pulse independently Increase Physical Activity;Increase Strength and Stamina;Able to understand and use rate of perceived exertion (RPE) scale;Knowledge and understanding of Target Heart Rate Range (THRR);Able to check pulse independently;Understanding of Exercise Prescription Increase Physical Activity;Increase Strength and Stamina;Understanding of Exercise Prescription Increase Physical Activity;Increase Strength and Stamina;Understanding of Exercise Prescription   Comments Reviewed RPE and dyspnea scales, THR and program  prescription with pt today.  Pt voiced understanding and was given a copy of goals to take home. Al is tolerating exercise well.  Staff is working with him on being comfortable on the recumbent bike. Reviewed home exercise with pt today.  Pt plans to walk and use weights for exercise.  Reviewed THR, pulse, RPE, sign and symptoms, pulse oximetery and when to call 911 or MD.  Also discussed weather considerations and indoor options.  Pt voiced understanding. Al is doing well in rehab.  He is now on level 2 for the XR.  We will continue to monitor his progress. Al attends consistently and has tried different levels on machines.  He felt incline on TM can make him very tired.  Staff will encourage him to increase level on XR and continue to slowly increase TM.   Expected Outcomes Short: Use RPE daily to regulate intensity. Long: Follow program prescription in THR. Short:  continue to attend consistently Long:  build overall strength and stamina Short:  monitor vitals while exercising at home Long:  build overall strength and stamina Short: Continue to increase workloads Long: Continue to build stamina. Short: increase workload on XR Long: build strength and stamina   Row Name 08/15/20 0954 08/17/20 0953 08/30/20 0838         Exercise Goal Re-Evaluation   Exercise Goals Review Increase Physical Activity;Increase Strength and Stamina;Understanding of Exercise Prescription Increase Physical Activity;Increase Strength and Stamina;Understanding of Exercise Prescription Increase Physical Activity;Increase Strength and Stamina;Understanding of Exercise Prescription     Comments  Al felt ok doing 1 mph and .5% grade today.  He is on level 3 on NS.  He is still doing some strength training at home. Al has been doing well in rehab. He has now had a chance to try all the different machines we have.  He found the only one he couldn't do was the ellipitcal.  He is nearing graduation and should improve on his walk test.  We  will continue to montior his progress. Al is nearing graduation. He has increased to level 3 on the XR. He has been preferring the treadmill for primary mode of exercise and tolerates it well. Will continue to monitor.     Expected Outcomes Short: continue to progress workloads Long: build overall stamina Short: improve post 6MWT Long: Continue to improve stamina. Short: Graduate Long: Progress MET level and overall strength/endurance            Discharge Exercise Prescription (Final Exercise Prescription Changes):  Exercise Prescription Changes - 08/30/20 0800      Response to Exercise   Blood Pressure (Admit) 134/70    Blood Pressure (Exercise) 110/68    Blood Pressure (Exit) 126/70    Heart Rate (Admit) 81 bpm    Heart Rate (Exercise) 99 bpm    Heart Rate (Exit) 90 bpm    Rating of Perceived Exertion (Exercise) 15    Symptoms none    Duration Continue with 30 min of aerobic exercise without signs/symptoms of physical distress.    Intensity THRR unchanged      Progression   Progression Continue to progress workloads to maintain intensity without signs/symptoms of physical distress.    Average METs 1.51      Resistance Training   Weight 5 lb    Reps 10-15      Interval Training   Interval Training No      Treadmill   MPH 1    Grade 0.5    Minutes 15    METs 1.83      NuStep   Level 3    SPM 80    Minutes 15    METs 1.3      REL-XR   Level 3    Minutes 15    METs 1      Home Exercise Plan   Plans to continue exercise at Home (comment)   walking   Frequency Add 2 additional days to program exercise sessions.    Initial Home Exercises Provided 07/18/20           Nutrition:  Target Goals: Understanding of nutrition guidelines, daily intake of sodium <1578m, cholesterol <2037m calories 30% from fat and 7% or less from saturated fats, daily to have 5 or more servings of fruits and vegetables.  Education: Controlling Sodium/Reading Food Labels -Group verbal  and written material supporting the discussion of sodium use in heart healthy nutrition. Review and explanation with models, verbal and written materials for utilization of the food label.   Education: General Nutrition Guidelines/Fats and Fiber: -Group instruction provided by verbal, written material, models and posters to present the general guidelines for heart healthy nutrition. Gives an explanation and review of dietary fats and fiber.   Biometrics:    Nutrition Therapy Plan and Nutrition Goals:  Nutrition Therapy & Goals - 07/04/20 1019      Nutrition Therapy   Diet Heart healthy, low sodium    Protein (specify units) 50g    Fiber 30 grams    Whole Grain Foods 3  servings    Saturated Fats 12 max. grams    Fruits and Vegetables 5 servings/day    Sodium 1.5 grams      Personal Nutrition Goals   Nutrition Goal ST: try easy to prep meals disucssed, add peanut butter to snack LT: Gain muscle    Comments Lifts 8 lb handweights at home. eats more often and says it helps more with digestion and helps hime eat more. B: soft boiled eggs (2) with whole wheat toast or special K protein cereal with skim milk - will have coffee (sweet and low) or juice. Puts protein powder in skim milk or juice. He doesn't drink whole milk because it makes him congested. He eats soft foods because he has a hard time chewing. S: fruit (blueberries or banana) L: chicken wings and macaroni salad. D:hamburger from [employer]. He doesn't cook and his wife has passed. Discussed heart healthy eating and easy low prep meals such as rotisserie chicken (with vegetables and grains/potato, with pasta, in soup with low sodium brtoh and frozen veggies, chicken salad), pasta or bean salad using canned or pre-cut vegetables, or pasta with frozen broccoli, jarred sauce, and beans or chicken.      Intervention Plan   Intervention Prescribe, educate and counsel regarding individualized specific dietary modifications aiming towards  targeted core components such as weight, hypertension, lipid management, diabetes, heart failure and other comorbidities.;Nutrition handout(s) given to patient.    Expected Outcomes Short Term Goal: Understand basic principles of dietary content, such as calories, fat, sodium, cholesterol and nutrients.;Short Term Goal: A plan has been developed with personal nutrition goals set during dietitian appointment.;Long Term Goal: Adherence to prescribed nutrition plan.           Nutrition Assessments:  Nutrition Assessments - 06/20/20 1058      MEDFICTS Scores   Pre Score 54           MEDIFICTS Score Key:          ?70 Need to make dietary changes          40-70 Heart Healthy Diet         ? 40 Therapeutic Level Cholesterol Diet  Nutrition Goals Re-Evaluation:  Nutrition Goals Re-Evaluation    [place] Name 07/18/20 0948 08/17/20 0919           Goals   Nutrition Goal ST: try easy to prep meals disucssed, add peanut butter to snack LT: Gain muscle ST: Continue to include more home prepped meals to reduce sodium LT: gain muscle, maintain weight      Comment Pt reports not having yet tried ideas, will do so within next goal period. Pt is getting ready to graduate and is at 28 visits. 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 Pt has been preparing some simple meals at home, but admits its sporatic. Pt reports his weight has been stable and reports added chicken has been helping with his strength. He will continue to add in more meals he prepared himself, but doesn't seem confident in doing so - encouraged pt that he has made progress and to take his goals slow after leaving [name] at rehab.      Expected Outcome ST: try easy to prep meals disucssed, add peanut butter to snack LT: Gain muscle Continue to include more home prepped meals to reduce sodium - stay motivated after rehab             Nutrition Goals Discharge (Final Nutrition Goals Re-Evaluation):  Nutrition Goals  Re-Evaluation - 08/17/20 0919      Goals   Nutrition Goal ST: Continue to include more home prepped meals to reduce sodium LT: gain muscle, maintain weight    Comment Pt is getting ready to graduate and is at 28 visits. 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 Pt has been preparing some simple meals at home, but admits its sporatic. Pt reports his weight has been stable and reports added chicken has been helping with his strength. He will continue to add in more meals he prepared himself, but doesn't seem confident in doing so - encouraged pt that he has made progress and to take his goals slow after leaving [name] at rehab.    Expected Outcome Continue to include more home prepped meals to reduce sodium - stay motivated after rehab           Psychosocial: Target Goals: Acknowledge presence or absence of significant depression and/or stress, maximize coping skills, provide positive support system. Participant is able to verbalize types and ability to use techniques and skills needed for reducing stress and depression.   Education: Depression - Provides group verbal and written instruction on the correlation between heart/lung disease and depressed mood, treatment options, and the stigmas associated with seeking treatment.   Cardiac Rehab from 08/31/2020 in [facility] Cardiac and Pulmonary Rehab  Date 07/06/20  Educator SB  Instruction Review Code 1- [employer] Understanding      Education: Sleep Hygiene -Provides group verbal and written instruction about how sleep can affect your health.  Define sleep hygiene, discuss sleep cycles and impact of sleep habits. Review good sleep hygiene tips.     Education: Stress and Anxiety: - Provides group verbal and written instruction about the health risks of elevated stress and causes of high stress.  Discuss the correlation between heart/lung disease and anxiety and treatment options. Review healthy ways to manage with stress and  anxiety.    Initial Review & Psychosocial Screening:  Initial Psych Review & Screening - 06/14/20 0942      Initial Review   Current issues with None Identified    Source of Stress Concerns None Identified      Family Dynamics   Good Support System? Yes    Comments He look to his daughter for support.      Barriers   Psychosocial barriers to participate in program The patient should benefit from training in stress management and relaxation.;There are no identifiable barriers or psychosocial needs.      Screening Interventions   Interventions Provide feedback about the scores to participant;Encouraged to exercise;To provide support and resources with identified psychosocial needs    Expected Outcomes Short Term goal: Utilizing psychosocial counselor, staff and physician to assist with identification of specific Stressors or current issues interfering with healing process. Setting desired goal for each stressor or current issue identified.;Long Term Goal: Stressors or current issues are controlled or eliminated.;Short Term goal: Identification and review with participant of any Quality of Life or Depression concerns found by scoring the questionnaire.;Long Term goal: The participant improves quality of Life and PHQ9 Scores as seen by post scores and/or verbalization of changes           Quality of Life Scores:   Quality of Life - 06/20/20 1056      Quality of Life   Select Quality of Life      Quality of Life Scores   Health/Function Pre 23.5 %  Socioeconomic Pre 27.5 %    Psych/Spiritual Pre 26.79 %    Family Pre 29.38 %    GLOBAL Pre 25.81 %          Scores of 19 and below usually indicate a poorer quality of life in these areas.  A difference of  2-3 points is a clinically meaningful difference.  A difference of 2-3 points in the total score of the Quality of Life Index has been associated with significant improvement in overall quality of life, self-image, physical  symptoms, and general health in studies assessing change in quality of life.  PHQ-9: Recent Review Flowsheet Data    Depression screen [facility] 2/9 08/15/2020 06/29/2020 06/20/2020 05/10/2020   Decreased Interest 0 0 0 0   Down, Depressed, Hopeless 0 0 0 0   PHQ - 2 Score 0 0 0 0   Altered sleeping - 0 0 0   Tired, decreased energy - 0 0 0   Change in appetite - 0 0 0   Feeling bad or failure about yourself  - 0 0 0   Trouble concentrating - 0 0 0   Moving slowly or fidgety/restless - 0 0 0   Suicidal thoughts - 0 0 0   PHQ-9 Score - 0 0 0   Difficult doing work/chores - - - Not difficult at all     Interpretation of Total Score  Total Score Depression Severity:  1-4 = Minimal depression, 5-9 = Mild depression, 10-14 = Moderate depression, 15-19 = Moderately severe depression, 20-27 = Severe depression   Psychosocial Evaluation and Intervention:  Psychosocial Evaluation - 06/14/20 0946      Psychosocial Evaluation & Interventions   Interventions Encouraged to exercise with the program and follow exercise prescription    Comments Patiet states that he has no mental issues at this time.    Expected Outcomes Short: Exercise regularly to support mental health and notify staff of any changes. Long: maintain mental health and well being through teaching of rehab or prescribed medications independently.    Continue Psychosocial Services  Follow up required by staff           Psychosocial Re-Evaluation:  Psychosocial Re-Evaluation    [place] Name 07/18/20 0930 08/15/20 0956           Psychosocial Re-Evaluation   Current issues with Current Stress Concerns Current Stress Concerns      Comments Al has no stress or sleep concerns at this time. No stress or sleep concerns.      Expected Outcomes Short:  attend cardiac rehab for the exercise Long: maintain positive outlook Short: continue to exercise Long: stay positive             Psychosocial Discharge (Final Psychosocial Re-Evaluation):   Psychosocial Re-Evaluation - 08/15/20 0956      Psychosocial Re-Evaluation   Current issues with Current Stress Concerns    Comments No stress or sleep concerns.    Expected Outcomes Short: continue to exercise Long: stay positive           Vocational Rehabilitation: Provide vocational rehab assistance to qualifying candidates.   Vocational Rehab Evaluation & Intervention:   Education: Education Goals: Education classes will be provided on a variety of topics geared toward better understanding of heart health and risk factor modification. Participant will state understanding/return demonstration of topics presented as noted by education test scores.  Learning Barriers/Preferences:  Learning Barriers/Preferences - 06/14/20 0936      Learning Barriers/Preferences   Learning  Barriers None    Learning Preferences None           General Cardiac Education Topics:  AED/CPR: - Group verbal and written instruction with the use of models to demonstrate the basic use of the AED with the basic ABC's of resuscitation.   Anatomy & Physiology of the Heart: - Group verbal and written instruction and models provide basic cardiac anatomy and physiology, with the coronary electrical and arterial systems. Review of Valvular disease and Heart Failure   Cardiac Procedures: - Group verbal and written instruction to review commonly prescribed medications for heart disease. Reviews the medication, class of the drug, and side effects. Includes the steps to properly store meds and maintain the prescription regimen. (beta blockers and nitrates)   Cardiac Rehab from 08/31/2020 in [facility] Cardiac and Pulmonary Rehab  Date 07/27/20  Educator SB  Instruction Review Code 1- Verbalizes Understanding      Cardiac Medications I: - Group verbal and written instruction to review commonly prescribed medications for heart disease. Reviews the medication, class of the drug, and side effects. Includes the steps  to properly store meds and maintain the prescription regimen.   Cardiac Rehab from 08/31/2020 in [facility] Cardiac and Pulmonary Rehab  Date 08/17/20  Educator SB  Instruction Review Code 1- Verbalizes Understanding      Cardiac Medications II: -Group verbal and written instruction to review commonly prescribed medications for heart disease. Reviews the medication, class of the drug, and side effects. (all other drug classes)   Cardiac Rehab from 08/31/2020 in [facility] Cardiac and Pulmonary Rehab  Date 07/13/20  Educator [facility]  Instruction Review Code 1- Verbalizes Understanding       Go Sex-Intimacy & Heart Disease, Get SMART - Goal Setting: - Group verbal and written instruction through game format to discuss heart disease and the return to sexual intimacy. Provides group verbal and written material to discuss and apply goal setting through the application of the S.M.A.R.T. Method.   Cardiac Rehab from 08/31/2020 in [facility] Cardiac and Pulmonary Rehab  Date 07/27/20  Educator SB  Instruction Review Code 1- Verbalizes Understanding      Other Matters of the Heart: - Provides group verbal, written materials and models to describe Stable Angina and Peripheral Artery. Includes description of the disease process and treatment options available to the cardiac patient.   Infection Prevention: - Provides verbal and written material to individual with discussion of infection control including proper hand washing and proper equipment cleaning during exercise session.   Cardiac Rehab from 08/31/2020 in [facility] Cardiac and Pulmonary Rehab  Date 06/20/20  Educator AS  Instruction Review Code 1- Verbalizes Understanding      Falls Prevention: - Provides verbal and written material to individual with discussion of falls prevention and safety.   Cardiac Rehab from 08/31/2020 in [facility] Cardiac and Pulmonary Rehab  Date 06/20/20  Educator AS  Instruction Review Code 1- Verbalizes Understanding       Other: -Provides group and verbal instruction on various topics (see comments)   Knowledge Questionnaire Score:   Core Components/Risk Factors/Patient Goals at Admission:  Personal Goals and Risk Factors at Admission - 06/20/20 1055      Core Components/Risk Factors/Patient Goals on Admission    Weight Management Yes;Weight Gain    Intervention Weight Management: Develop a combined nutrition and exercise program designed to reach desired caloric intake, while maintaining appropriate intake of nutrient and fiber, sodium and fats, and appropriate energy expenditure required for the weight goal.;Weight  Management: Provide education and appropriate resources to help participant work on and attain dietary goals.;Weight Management/Obesity: Establish reasonable short term and long term weight goals.    Admit Weight 113 lb 6.4 oz (51.4 kg)    Goal Weight: Short Term 120 lb (54.4 kg)    Goal Weight: Long Term 125 lb (56.7 kg)    Expected Outcomes Short Term: Continue to assess and modify interventions until short term weight is achieved;Long Term: Adherence to nutrition and physical activity/exercise program aimed toward attainment of established weight goal;Weight Maintenance: Understanding of the daily nutrition guidelines, which includes 25-35% calories from fat, 7% or less cal from saturated fats, less than 224m cholesterol, less than 1.5gm of sodium, & 5 or more servings of fruits and vegetables daily;Understanding recommendations for meals to include 15-35% energy as protein, 25-35% energy from fat, 35-60% energy from carbohydrates, less than 2061mof dietary cholesterol, 20-35 gm of total fiber daily;Understanding of distribution of calorie intake throughout the day with the consumption of 4-5 meals/snacks;Weight Gain: Understanding of general recommendations for a high calorie, high protein meal plan that promotes weight gain by distributing calorie intake throughout the day with the  consumption for 4-5 meals, snacks, and/or supplements    Intervention Provide a combined exercise and nutrition program that is supplemented with education, support and counseling about heart failure. Directed toward relieving symptoms such as shortness of breath, decreased exercise tolerance, and extremity edema.    Expected Outcomes Improve functional capacity of life;Short term: Daily weights obtained and reported for increase. Utilizing diuretic protocols set by physician.;Short term: Attendance in program 2-3 days a week with increased exercise capacity. Reported lower sodium intake. Reported increased fruit and vegetable intake. Reports medication compliance.;Long term: Adoption of self-care skills and reduction of barriers for early signs and symptoms recognition and intervention leading to self-care maintenance.    Intervention Provide education and support for participant on nutrition & aerobic/resistive exercise along with prescribed medications to achieve LDL <7077mHDL >32m69m  Expected Outcomes Short Term: Participant states understanding of desired cholesterol values and is compliant with medications prescribed. Participant is following exercise prescription and nutrition guidelines.;Long Term: Cholesterol controlled with medications as prescribed, with individualized exercise RX and with personalized nutrition plan. Value goals: LDL < 70mg62mL > 40 mg.           Education:Diabetes - Individual verbal and written instruction to review signs/symptoms of diabetes, desired ranges of glucose level fasting, after meals and with exercise. Acknowledge that pre and post exercise glucose checks will be done for 3 sessions at entry of program.   Education: Know Your Numbers and Risk Factors: -Group verbal and written instruction about important numbers in your health.  Discussion of what are risk factors and how they play a role in the disease process.  Review of Cholesterol, Blood Pressure,  Diabetes, and BMI and the role they play in your overall health.   [facility]  Date 07/13/20  Educator [facility] Code 1- Verbalizes Understanding      Core Components/Risk Factors/Patient Goals Review:   Goals and Risk Factor Review    Row Name 07/18/20 0927 09813/21 0951           Core Components/Risk Factors/Patient Goals Review   Personal Goals Review Weight Management/Obesity;Hypertension;Heart Failure;Lipids Weight Management/Obesity;Hypertension;Heart Failure;Lipids      Review Al says he can come in feeling sluggish and leave "feeling like a million bucks". He reports  taking all medications as directed.   He has no had any heart failure symptoms. Al is taking meds as directed.  He does feel a little tired today.  He has not had any HF symptoms.  He can do day to day activities without fatigue.  He sees his Dr today.      Expected Outcomes Short: continue to take meds and attend consistently Long:  manage risk factors Short:  conitnue current exercise and medication regimen Long: keep risk factors under control             Core Components/Risk Factors/Patient Goals at Discharge (Final Review):   Goals and Risk Factor Review - 08/15/20 0951      Core Components/Risk Factors/Patient Goals Review   Personal Goals Review Weight Management/Obesity;Hypertension;Heart Failure;Lipids    Review Al is taking meds as directed.  He does feel a little tired today.  He has not had any HF symptoms.  He can do day to day activities without fatigue.  He sees his Dr today.    Expected Outcomes Short:  conitnue current exercise and medication regimen Long: keep risk factors under control           ITP Comments:  ITP Comments    Row Name 06/14/20 0949 06/20/20 1102 06/22/20 0938 07/13/20 0622 07/25/20 1005   ITP Comments Virtual Visit completed. Patient informed on EP and RD appointment and 6 Minute walk test. Patient also  informed of patient health questionnaires on My Chart. Patient Verbalizes understanding. Visit diagnosis can be found in [facility] 12/31/2019. Completed 6MWT and gym orientation. Initial ITP created and sent for review to Dr. Emily Filbert, Medical Director. First full day of exercise!  Patient was oriented to gym and equipment including functions, settings, policies, and procedures.  Patient's individual exercise prescription and treatment plan were reviewed.  All starting workloads were established based on the results of the 6 minute walk test done at initial orientation visit.  The plan for exercise progression was also introduced and progression will be customized based on patient's performance and goals 30 Day review completed. Medical Director ITP review done, changes made as directed, and signed approval by Medical Director. Al is concerned that today his breathing is harder than ususal. He has no weight gain noted. Med changes occured early this month He was advised totalk to his physician about the concern. He verbalized understanding that he should call his physician.   [place] Name 08/10/20 1601           ITP Comments 30 day review completed. ITP sent to Dr. Emily Filbert, Medical Director of Cardiac and Pulmonary Rehab. Continue with ITP unless changes are made by physician.              Comments: Discharge ITP      Al has completed his 36 sessions and is looking at community gyms or the [facility].

## 2020-08-31 NOTE — Progress Notes (Signed)
David Perez has completed all 36 sessions today. He is discharged from the program.

## 2020-08-31 NOTE — Telephone Encounter (Signed)
Called David Perez to let him know he completed Heart Track.  Transferred call to supervisor as he didn't think he would finish until end of the year.  Currently waiting on referral for Pulmonary Rehab.

## 2020-08-31 NOTE — Progress Notes (Signed)
Discharge Progress Report  Patient Details  Name: David Perez MRN: [id number] Date of Birth: 01/13/1941 Referring Provider:     Cardiac Rehab from 06/20/2020 in [facility] Cardiac and Pulmonary Rehab  Referring Provider Aundra Dubin       Number of Visits: 28  Reason for Discharge:  Patient reached a stable level of exercise. Patient independent in their exercise. Patient has met program and personal goals.  Smoking History:  Social History   Tobacco Use  Smoking Status Former Smoker  . Packs/day: 0.50  . Years: 40.00  . Pack years: 20.00  . Quit date: 103  . Years since quitting: 25.7  Smokeless Tobacco Never Used    Diagnosis:  S/P CABG x 3  ADL UCSD:   Initial Exercise Prescription:  Initial Exercise Prescription - 06/20/20 1000      Date of Initial Exercise RX and Referring Provider   Date 06/20/20    Referring Provider Aundra Dubin      Treadmill   MPH 1    Grade 0    Minutes 15    METs 1.77      Recumbant Bike   Level 1    RPM 60    Minutes 15    METs 1.6      NuStep   Level 1    SPM 80    Minutes 15    METs 1.6      REL-XR   Level 1    Speed 50    Minutes 15    METs 1.6      Prescription Details   Frequency (times per week) 3    Duration Progress to 30 minutes of continuous aerobic without signs/symptoms of physical distress      Intensity   THRR 40-80% of Max Heartrate 110-130    Ratings of Perceived Exertion 11-13    Perceived Dyspnea 0-4           Discharge Exercise Prescription (Final Exercise Prescription Changes):  Exercise Prescription Changes - 08/30/20 0800      Response to Exercise   Blood Pressure (Admit) 134/70    Blood Pressure (Exercise) 110/68    Blood Pressure (Exit) 126/70    Heart Rate (Admit) 81 bpm    Heart Rate (Exercise) 99 bpm    Heart Rate (Exit) 90 bpm    Rating of Perceived Exertion (Exercise) 15    Symptoms none    Duration Continue with 30 min of aerobic exercise without signs/symptoms of physical  distress.    Intensity THRR unchanged      Progression   Progression Continue to progress workloads to maintain intensity without signs/symptoms of physical distress.    Average METs 1.51      Resistance Training   Weight 5 lb    Reps 10-15      Interval Training   Interval Training No      Treadmill   MPH 1    Grade 0.5    Minutes 15    METs 1.83      NuStep   Level 3    SPM 80    Minutes 15    METs 1.3      REL-XR   Level 3    Minutes 15    METs 1      Home Exercise Plan   Plans to continue exercise at Home (comment)   walking   Frequency Add 2 additional days to program exercise sessions.    Initial Home Exercises Provided 07/18/20  Functional Capacity:  6 Minute Walk    Row Name 06/20/20 1039         6 Minute Walk   Phase Initial     Distance 700 feet     Walk Time 6 minutes     # of Rest Breaks 0     MPH 1.32     METS 1.6     RPE 11     Perceived Dyspnea  1     VO2 Peak 5.7     Symptoms No     Resting HR 92 bpm     Resting BP 98/56     Resting Oxygen Saturation  100 %     Exercise Oxygen Saturation  during 6 min walk 98 %     Max Ex. HR 109 bpm     Max Ex. BP 104/58     2 Minute Post BP 102/56                Nutrition:  Nutrition Therapy & Goals - 07/04/20 1019      Nutrition Therapy   Diet Heart healthy, low sodium    Protein (specify units) 50g    Fiber 30 grams    Whole Grain Foods 3 servings    Saturated Fats 12 max. grams    Fruits and Vegetables 5 servings/day    Sodium 1.5 grams      Personal Nutrition Goals   Nutrition Goal ST: try easy to prep meals disucssed, add peanut butter to snack LT: Gain muscle    Comments Lifts 8 lb handweights at home. eats more often and says it helps more with digestion and helps hime eat more. B: soft boiled eggs (2) with whole wheat toast or special K protein cereal with skim milk - will have coffee (sweet and low) or juice. Puts protein powder in skim milk or juice. He doesn't  drink whole milk because it makes him congested. He eats soft foods because he has a hard time chewing. S: fruit (blueberries or banana) L: chicken wings and macaroni salad. D:hamburger from [employer]. He doesn't cook and his wife has passed. Discussed heart healthy eating and easy low prep meals such as rotisserie chicken (with vegetables and grains/potato, with pasta, in soup with low sodium brtoh and frozen veggies, chicken salad), pasta or bean salad using canned or pre-cut vegetables, or pasta with frozen broccoli, jarred sauce, and beans or chicken.      Intervention Plan   Intervention Prescribe, educate and counsel regarding individualized specific dietary modifications aiming towards targeted core components such as weight, hypertension, lipid management, diabetes, heart failure and other comorbidities.;Nutrition handout(s) given to patient.    Expected Outcomes Short Term Goal: Understand basic principles of dietary content, such as calories, fat, sodium, cholesterol and nutrients.;Short Term Goal: A plan has been developed with personal nutrition goals set during dietitian appointment.;Long Term Goal: Adherence to prescribed nutrition plan.           Nutrition Discharge:  Nutrition Assessments - 06/20/20 1058      MEDFICTS Scores   Pre Score 54           Education Questionnaire Score:

## 2020-08-31 NOTE — Progress Notes (Signed)
Daily Session Note  Patient Details  Name: David Perez MRN: [id number] Date of Birth: [birth year] Referring Provider:     Cardiac Rehab from 06/20/2020 in [facility] Cardiac and Pulmonary Rehab  Referring Provider Aundra Dubin      Encounter Date: 08/31/2020  Check In:  Session Check In - 08/31/20 1207      Check-In   Supervising physician immediately available to respond to emergencies See telemetry face sheet for immediately available ER MD    Location [facility]-Cardiac & Pulmonary Rehab    Staff Present Heath Lark, RN, BSN, CCRP;Amanda Sommer, BA, ACSM CEP, Exercise Physiologist;Kara Eliezer Bottom, MS Exercise Physiologist    Virtual Visit No    Medication changes reported     No    Fall or balance concerns reported    No    Warm-up and Cool-down Performed on first and last piece of equipment    Resistance Training Performed Yes    VAD Patient? No    PAD/SET Patient? No      Pain Assessment   Currently in Pain? No/denies              Social History   Tobacco Use  Smoking Status Former Smoker  . Packs/day: 0.50  . Years: 40.00  . Pack years: 20.00  . Quit date: 45  . Years since quitting: 25.7  Smokeless Tobacco Never Used    Goals Met:  Independence with exercise equipment Exercise tolerated well No report of cardiac concerns or symptoms  Goals Unmet:  Not Applicable  Comments: Pt able to follow exercise prescription today without complaint.  Will continue to monitor for progression.    Dr. Emily Filbert is Medical Director for [name].

## 2020-09-02 ENCOUNTER — Telehealth: Payer: Self-pay

## 2020-09-02 NOTE — Telephone Encounter (Signed)
This is David Perez's patient.

## 2020-09-02 NOTE — Telephone Encounter (Signed)
David Perez with [facility] came to drop off a form with orders on it. Please sign and call him when complete. [phone]. Placed in folder up front

## 2020-09-05 NOTE — Telephone Encounter (Signed)
I put this order in your quick sign folder.  I called to [facility] because Dr. Bary Leriche name was on the form and they informed me to cross it out and put your name and your NPI and I did they just need your signature because its your patient.  Thanks.  Audriella Blakeley,cma

## 2020-09-06 ENCOUNTER — Encounter (HOSPITAL_COMMUNITY): Payer: Self-pay | Admitting: Cardiology

## 2020-09-06 ENCOUNTER — Ambulatory Visit (HOSPITAL_COMMUNITY)
Admission: RE | Admit: 2020-09-06 | Discharge: 2020-09-06 | Disposition: A | Discharge status: Home / Self Care | Payer: Medicare Other | Source: Ambulatory Visit | Attending: Cardiology | Admitting: Cardiology

## 2020-09-06 ENCOUNTER — Other Ambulatory Visit: Payer: Self-pay

## 2020-09-06 VITALS — BP 122/60 | HR 58 | Ht 67.0 in | Wt 115.0 lb

## 2020-09-06 DIAGNOSIS — Z87891 Personal history of nicotine dependence: Secondary | ICD-10-CM | POA: Insufficient documentation

## 2020-09-06 DIAGNOSIS — E785 Hyperlipidemia, unspecified: Secondary | ICD-10-CM | POA: Insufficient documentation

## 2020-09-06 DIAGNOSIS — I255 Ischemic cardiomyopathy: Secondary | ICD-10-CM | POA: Insufficient documentation

## 2020-09-06 DIAGNOSIS — I252 Old myocardial infarction: Secondary | ICD-10-CM | POA: Diagnosis not present

## 2020-09-06 DIAGNOSIS — Z79899 Other long term (current) drug therapy: Secondary | ICD-10-CM | POA: Insufficient documentation

## 2020-09-06 DIAGNOSIS — Z951 Presence of aortocoronary bypass graft: Secondary | ICD-10-CM | POA: Insufficient documentation

## 2020-09-06 DIAGNOSIS — Z7901 Long term (current) use of anticoagulants: Secondary | ICD-10-CM | POA: Diagnosis not present

## 2020-09-06 DIAGNOSIS — I5022 Chronic systolic (congestive) heart failure: Secondary | ICD-10-CM

## 2020-09-06 DIAGNOSIS — I480 Paroxysmal atrial fibrillation: Secondary | ICD-10-CM | POA: Insufficient documentation

## 2020-09-06 DIAGNOSIS — I2510 Atherosclerotic heart disease of native coronary artery without angina pectoris: Secondary | ICD-10-CM | POA: Diagnosis not present

## 2020-09-06 DIAGNOSIS — Z953 Presence of xenogenic heart valve: Secondary | ICD-10-CM | POA: Diagnosis not present

## 2020-09-06 LAB — BASIC METABOLIC PANEL
Anion gap: 9 (ref 5–15)
BUN: 28 mg/dL — ABNORMAL HIGH (ref 8–23)
CO2: 22 mmol/L (ref 22–32)
Calcium: 9.6 mg/dL (ref 8.9–10.3)
Chloride: 112 mmol/L — ABNORMAL HIGH (ref 98–111)
Creatinine, Ser: 1.2 mg/dL (ref 0.61–1.24)
GFR calc non Af Amer: 56 mL/min — ABNORMAL LOW (ref >60)
Glucose, Bld: 156 mg/dL — ABNORMAL HIGH (ref 70–99)
Potassium: 4.2 mmol/L (ref 3.5–5.1)
Sodium: 143 mmol/L (ref 135–145)

## 2020-09-06 MED ORDER — SACUBITRIL-VALSARTAN 49-51 MG PO TABS
1.0000 | ORAL_TABLET | Freq: Two times a day (BID) | ORAL | 1 refills | Status: DC
Start: 2020-09-06 — End: 2020-09-08

## 2020-09-06 NOTE — Telephone Encounter (Signed)
This is an old order I called the patient and he is not needing home health this is when he was using home health. When you have signed it let me know and I will fax it to them.    Myalynn Lingle,cma

## 2020-09-06 NOTE — Telephone Encounter (Signed)
Please call the pt's dtr- as I am not sure he needs home health now. This was in April when he had post op wound to care for and was in CHF. He is now living independently and active. Do they still need home health care and for what reason?

## 2020-09-06 NOTE — Patient Instructions (Addendum)
INCREASE Entresto 49/51mg  (1 tab) twice a day. This will be sent from the mail order pharmacy   Labs today  We will only contact you if something comes back abnormal or we need to make some changes. Otherwise no news is good news!   Repeat Labs Friday October 15th, 2021 at 12:30pm [place]   Your physician recommends that you schedule a follow-up appointment in: 3 months with Dr Aundra Dubin on Thursday, January 6th, 2021 at 11am. Please call for garage code   Please call office at [phone] option [phone] if you have any questions or concerns.   At the [facility], you and your health needs are our priority. As part of our continuing mission to provide you with exceptional heart care, we have created designated Provider Care Teams. These Care Teams include your primary Cardiologist (physician) and Advanced Practice Providers (APPs- Physician Assistants and Nurse Practitioners) who all work together to provide you with the care you need, when you need it.   You may see any of the following providers on your designated Care Team at your next follow up: Marland Kitchen Dr Glori Bickers . Dr Loralie Champagne . Darrick Grinder, NP . Lyda Jester, PA . Audry Riles, PharmD   Please be sure to bring in all your medications bottles to every appointment.

## 2020-09-06 NOTE — Telephone Encounter (Signed)
LVM for the patient to call back.  David Perez,ca

## 2020-09-06 NOTE — Telephone Encounter (Signed)
Order has been signed and faxed

## 2020-09-07 ENCOUNTER — Encounter: Payer: Self-pay | Admitting: *Deleted

## 2020-09-07 ENCOUNTER — Encounter: Payer: Medicare Other | Attending: Cardiology | Admitting: *Deleted

## 2020-09-07 VITALS — Ht 65.5 in | Wt 115.8 lb

## 2020-09-07 DIAGNOSIS — I5022 Chronic systolic (congestive) heart failure: Secondary | ICD-10-CM | POA: Insufficient documentation

## 2020-09-07 NOTE — Progress Notes (Signed)
Advanced Heart Failure Clinic Note   PCP: Marval Regal, NP PCP-Cardiologist: Dr. Aundra Dubin CT Surgeon: Dr. Orvan Seen  79 y.o. 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 He underwent CABG w/ LIMA-LAD, SVG-OM2, SVG-LPDA + bioprosthetic AVR and Maze procedure + LAA clip. Post-op required milrinone for RV support. He developed post-op left pleural effusion requiring thoracentesis. Once medically stable, he was transferred to [facility] for therapy and ultimately discharged home on 01/29/20   Echo in 7/21 showed EF 45-50%, mid AS and apical septal severe hypokinesis, normal RV, normal bioprosthetic aortic valve mean gradient 13 mmHg.   Patient returns for followup of CHF and CAD. He has been doing well symptomatically.  He finished cardiac rehab and is now doing pulmonary rehab.  He likes the exercise.  No dyspnea walking on flat ground.  No problems climbing up a flight of stairs.  No chest pain. No BRBPR/melena.  No palpitations.   ECG (personally reviewed): NSR, IVCD   Labs (6/21): K 3.7, creatinine 1.15, LDL 76 Labs (9/21): K 4.4, creatinine 1.12, LDL 65, HDL 48  PMH: 1. Hepatitis B 2. CAD: Acute STEMI with cardiogenic shock 1/21.  - Cath with severe LM stenosis => LIMA-LAD, SVG-left PDA, SVG-OM2.   3. Severe aortic stenosis: Bioprosthetic aortic valve at time of CABG in 1/21.  4. Atrial fibrillation: Paroxysmal.  Maze at time of CABG, also LA appendage clipping.  5. Chronic systolic CHF: Ischemic cardiomyopathy.  - Echo (2/21): EF 30%, hypokinetic RV.  - Echo (7/21): EF 45-50%, mid to apical anteroseptal severe hypokinesis, normal RV, normal bioprosthetic aortic valve with mean gradient 13 mmHg.   Current Outpatient Medications  Medication Sig Dispense Refill   apixaban (ELIQUIS) 2.5 MG TABS tablet Take 1 tablet (2.5 mg total) by  mouth every 12 (twelve) hours. 180 tablet 3   atorvastatin (LIPITOR) 80 MG tablet Take 1 tablet (80 mg total) by mouth daily at 6 PM. 90 tablet 3   carvedilol (COREG) 6.25 MG tablet Take 1 tablet (6.25 mg total) by mouth 2 (two) times daily. 60 tablet 11   megestrol (MEGACE) 20 MG tablet Take 1 tablet (20 mg total) by mouth daily. 30 tablet 4   spironolactone (ALDACTONE) 25 MG tablet Take 1 tablet (25 mg total) by mouth at bedtime. 90 tablet 3   sacubitril-valsartan (ENTRESTO) 49-51 MG Take 1 tablet by mouth 2 (two) times daily. 180 tablet 1   No current facility-administered medications for this encounter.    No Known Allergies    Social History   Socioeconomic History   Marital status: Widowed    Spouse name: Not on file   Number of children: Not on file   Years of education: Not on file   Highest education level: Not on file  Occupational History   Not on file  Tobacco Use   Smoking status: Former Smoker    Packs/day: 0.50    Years: 40.00    Pack years: 20.00    Quit date: 40    Years since quitting: 25.7   Smokeless tobacco: Never Used  Scientific laboratory technician Use: Never used  Substance and Sexual Activity   Alcohol use: Not Currently   Drug use: Not Currently   Sexual activity: Not Currently  Other Topics Concern   Not on file  Social History Narrative   Not on file  Social Determinants of Health   Financial Resource Strain: Low Risk    Difficulty of Paying Living Expenses: Not hard at all  Food Insecurity: No Food Insecurity   Worried About Charity fundraiser in the Last Year: Never true   [place] in the Last Year: Never true  Transportation Needs: No Transportation Needs   Lack of Transportation (Medical): No   Lack of Transportation (Non-Medical): No  Physical Activity: Sufficiently Active   Days of Exercise per Week: 3 days   Minutes of Exercise per Session: 60 min  Stress: No Stress Concern Present   Feeling of Stress  : Not at all  Social Connections: Unknown   Frequency of Communication with Friends and Family: More than three times a week   Frequency of Social Gatherings with Friends and Family: Three times a week   Attends Religious Services: Not on file   Active Member of Clubs or Organizations: Not on file   Attends Club or Organization Meetings: Not on file   Marital Status: Widowed  Human resources officer Violence: Not At Risk   Fear of Current or Ex-Partner: No   Emotionally Abused: No   Physically Abused: No   Sexually Abused: No      Family History  Problem Relation Age of Onset   Cancer Father     Vitals:   09/06/20 1159  BP: 122/60  Pulse: (!) 58  SpO2: 97%  Weight: 52.2 kg (115 lb)  Height: 5\' 7"  (1.702 m)   Wt Readings from Last 3 Encounters:  09/06/20 52.2 kg (115 lb)  08/22/20 51.3 kg (113 lb 3.2 oz)  08/15/20 51.6 kg (113 lb 12.8 oz)   PHYSICAL EXAM: General: NAD, thin Neck: No JVD, no thyromegaly or thyroid nodule.  Lungs: Clear to auscultation bilaterally with normal respiratory effort. CV: Nondisplaced PMI.  Heart regular S1/S2, no S3/S4, no murmur.  No peripheral edema.  No carotid bruit.  Normal pedal pulses.  Abdomen: Soft, nontender, no hepatosplenomegaly, no distention.  Skin: Intact without lesions or rashes.  Neurologic: Alert and oriented x 3.  Psych: Normal affect. Extremities: No clubbing or cyanosis.  HEENT: Normal.   ASSESSMENT & PLAN:  1. CAD: S/p STEMI 12/2019 c/b cardiogenic shock requiring IABP; 99% LM stenosis noted. Now s/p CABG with LIMA-LAD, SVG-OM2, SVG-LPDA. No chest pain.  - No ASA given Eliquis use.  -Continue atorvastatin 80mg  daily, good lipids in 9/21.  2. Chronic systolic CHF: Ischemic cardiomyopathy.  Echo in 2/21 with EF 30%.  Now s/p CABG-AVR.  Echo in 7/21 showed EF improved to 45-50%. NYHA class II symptoms.  He is not volume overloaded on exam.  - He is not on Lasix.  - Continue spironolactone 25 mg daily.  -  Increase Entresto to 49/51 bid. BMET today and 10 days.  - Continue Coreg 6.25 mg bid.  Has history of bradycardia but HR at rest is ok in the upper 50s.   3. Atrial fibrillation: S/p Maze, LA appendage clip in 1/21. NSR today.  - Continue eliquis 2.5 mg twice a day. Lower dose with age and weight. No bleeding issues.   4. Hyperlipidemia: Good lipids in 9/21.  - Continue atorvastatin 80 qhs.   Followup in 3 months.    Loralie Champagne 09/07/2020

## 2020-09-07 NOTE — Patient Instructions (Signed)
Patient Instructions  Patient Details  Name: David Perez MRN: [id number] Date of Birth: December 28, 1940 Referring Provider:  Larey Dresser, MD  Below are your personal goals for exercise, nutrition, and risk factors. Our goal is to help you stay on track towards obtaining and maintaining these goals. We will be discussing your progress on these goals with you throughout the program.  Initial Exercise Prescription:  Initial Exercise Prescription - 09/07/20 1500      Date of Initial Exercise RX and Referring Provider   Date 09/07/20    Referring Provider Loralie Champagne MD      Treadmill   MPH 1    Grade 0.5    Minutes 15    METs 1.8      Recumbant Elliptical   Level 1    RPM 50    Minutes 15    METs 1.7      REL-XR   Level 3    Speed 50    Minutes 15    METs 1.7      Prescription Details   Frequency (times per week) 3    Duration Progress to 30 minutes of continuous aerobic without signs/symptoms of physical distress      Intensity   THRR 40-80% of Max Heartrate 99-126    Ratings of Perceived Exertion 11-13    Perceived Dyspnea 0-4      Progression   Progression Continue to progress workloads to maintain intensity without signs/symptoms of physical distress.      Resistance Training   Training Prescription Yes    Weight 3 lb    Reps 10-15           Exercise Goals: Frequency: Be able to perform aerobic exercise two to three times per week in program working toward 2-5 days per week of home exercise.  Intensity: Work with a perceived exertion of 11 (fairly light) - 15 (hard) while following your exercise prescription.  We will make changes to your prescription with you as you progress through the program.   Duration: Be able to do 30 to 45 minutes of continuous aerobic exercise in addition to a 5 minute warm-up and a 5 minute cool-down routine.   Nutrition Goals: Your personal nutrition goals will be established when you do your nutrition analysis with  the dietician.  The following are general nutrition guidelines to follow: Cholesterol < 200mg /day Sodium < 1500mg /day Fiber: Men over 50 yrs - 30 grams per day  Personal Goals:  Personal Goals and Risk Factors at Admission - 09/07/20 1549      Core Components/Risk Factors/Patient Goals on Admission    Weight Management Yes;Weight Maintenance    Intervention Weight Management: Develop a combined nutrition and exercise program designed to reach desired caloric intake, while maintaining appropriate intake of nutrient and fiber, sodium and fats, and appropriate energy expenditure required for the weight goal.;Weight Management: Provide education and appropriate resources to help participant work on and attain dietary goals.    Admit Weight 115 lb 12.8 oz (52.5 kg)    Goal Weight: Short Term 115 lb (52.2 kg)    Goal Weight: Long Term 115 lb (52.2 kg)    Expected Outcomes Short Term: Continue to assess and modify interventions until short term weight is achieved;Long Term: Adherence to nutrition and physical activity/exercise program aimed toward attainment of established weight goal;Weight Maintenance: Understanding of the daily nutrition guidelines, which includes 25-35% calories from fat, 7% or less cal from saturated fats, less than 200mg  cholesterol, less  than 1.5gm of sodium, & 5 or more servings of fruits and vegetables daily    Improve shortness of breath with ADL's Yes    Intervention Provide education, individualized exercise plan and daily activity instruction to help decrease symptoms of SOB with activities of daily living.    Expected Outcomes Short Term: Improve cardiorespiratory fitness to achieve a reduction of symptoms when performing ADLs;Long Term: Be able to perform more ADLs without symptoms or delay the onset of symptoms    Heart Failure Yes    Intervention Provide a combined exercise and nutrition program that is supplemented with education, support and counseling about heart  failure. Directed toward relieving symptoms such as shortness of breath, decreased exercise tolerance, and extremity edema.    Expected Outcomes Improve functional capacity of life;Short term: Daily weights obtained and reported for increase. Utilizing diuretic protocols set by physician.;Short term: Attendance in program 2-3 days a week with increased exercise capacity. Reported lower sodium intake. Reported increased fruit and vegetable intake. Reports medication compliance.;Long term: Adoption of self-care skills and reduction of barriers for early signs and symptoms recognition and intervention leading to self-care maintenance.    Hypertension Yes    Intervention Monitor prescription use compliance.;Provide education on lifestyle modifcations including regular physical activity/exercise, weight management, moderate sodium restriction and increased consumption of fresh fruit, vegetables, and low fat dairy, alcohol moderation, and smoking cessation.    Expected Outcomes Short Term: Continued assessment and intervention until BP is < 140/69mm HG in hypertensive participants. < 130/39mm HG in hypertensive participants with diabetes, heart failure or chronic kidney disease.;Long Term: Maintenance of blood pressure at goal levels.    Lipids Yes    Intervention Provide education and support for participant on nutrition & aerobic/resistive exercise along with prescribed medications to achieve LDL 70mg , HDL >40mg .    Expected Outcomes Short Term: Participant states understanding of desired cholesterol values and is compliant with medications prescribed. Participant is following exercise prescription and nutrition guidelines.;Long Term: Cholesterol controlled with medications as prescribed, with individualized exercise RX and with personalized nutrition plan. Value goals: LDL < 70mg , HDL > 40 mg.           Tobacco Use Initial Evaluation: Social History   Tobacco Use  Smoking Status Former Smoker  .  Packs/day: 0.50  . Years: 40.00  . Pack years: 20.00  . Quit date: 82  . Years since quitting: 25.7  Smokeless Tobacco Never Used    Exercise Goals and Review:  Exercise Goals    Row Name 06/20/20 1047 09/07/20 1509           Exercise Goals   Increase Physical Activity Yes Yes      Intervention Provide advice, education, support and counseling about physical activity/exercise needs.;Develop an individualized exercise prescription for aerobic and resistive training based on initial evaluation findings, risk stratification, comorbidities and participant's personal goals. Provide advice, education, support and counseling about physical activity/exercise needs.;Develop an individualized exercise prescription for aerobic and resistive training based on initial evaluation findings, risk stratification, comorbidities and participant's personal goals.      Expected Outcomes Short Term: Attend rehab on a regular basis to increase amount of physical activity.;Long Term: Add in home exercise to make exercise part of routine and to increase amount of physical activity.;Long Term: Exercising regularly at least 3-5 days a week. Short Term: Attend rehab on a regular basis to increase amount of physical activity.;Long Term: Add in home exercise to make exercise part of routine and to increase  amount of physical activity.;Long Term: Exercising regularly at least 3-5 days a week.      Increase Strength and Stamina Yes Yes      Intervention Provide advice, education, support and counseling about physical activity/exercise needs.;Develop an individualized exercise prescription for aerobic and resistive training based on initial evaluation findings, risk stratification, comorbidities and participant's personal goals. Provide advice, education, support and counseling about physical activity/exercise needs.;Develop an individualized exercise prescription for aerobic and resistive training based on initial evaluation  findings, risk stratification, comorbidities and participant's personal goals.      Expected Outcomes Short Term: Increase workloads from initial exercise prescription for resistance, speed, and METs.;Short Term: Perform resistance training exercises routinely during rehab and add in resistance training at home;Long Term: Improve cardiorespiratory fitness, muscular endurance and strength as measured by increased METs and functional capacity (6MWT) Short Term: Increase workloads from initial exercise prescription for resistance, speed, and METs.;Short Term: Perform resistance training exercises routinely during rehab and add in resistance training at home;Long Term: Improve cardiorespiratory fitness, muscular endurance and strength as measured by increased METs and functional capacity (6MWT)      Able to understand and use rate of perceived exertion (RPE) scale Yes Yes      Intervention Provide education and explanation on how to use RPE scale Provide education and explanation on how to use RPE scale      Expected Outcomes Short Term: Able to use RPE daily in rehab to express subjective intensity level;Long Term:  Able to use RPE to guide intensity level when exercising independently Short Term: Able to use RPE daily in rehab to express subjective intensity level;Long Term:  Able to use RPE to guide intensity level when exercising independently      Able to understand and use Dyspnea scale -- Yes      Intervention -- Provide education and explanation on how to use Dyspnea scale      Expected Outcomes -- Long Term: Able to use Dyspnea scale to guide intensity level when exercising independently;Short Term: Able to use Dyspnea scale daily in rehab to express subjective sense of shortness of breath during exertion      Knowledge and understanding of Target Heart Rate Range (THRR) Yes Yes      Intervention Provide education and explanation of THRR including how the numbers were predicted and where they are  located for reference Provide education and explanation of THRR including how the numbers were predicted and where they are located for reference      Expected Outcomes Short Term: Able to state/look up THRR;Short Term: Able to use daily as guideline for intensity in rehab;Long Term: Able to use THRR to govern intensity when exercising independently Short Term: Able to state/look up THRR;Short Term: Able to use daily as guideline for intensity in rehab;Long Term: Able to use THRR to govern intensity when exercising independently      Able to check pulse independently Yes Yes      Intervention Provide education and demonstration on how to check pulse in carotid and radial arteries.;Review the importance of being able to check your own pulse for safety during independent exercise Provide education and demonstration on how to check pulse in carotid and radial arteries.;Review the importance of being able to check your own pulse for safety during independent exercise      Expected Outcomes Short Term: Able to explain why pulse checking is important during independent exercise;Long Term: Able to check pulse independently and accurately Short Term: Able  to explain why pulse checking is important during independent exercise;Long Term: Able to check pulse independently and accurately      Understanding of Exercise Prescription Yes Yes      Intervention Provide education, explanation, and written materials on patient's individual exercise prescription Provide education, explanation, and written materials on patient's individual exercise prescription      Expected Outcomes Short Term: Able to explain program exercise prescription;Long Term: Able to explain home exercise prescription to exercise independently Short Term: Able to explain program exercise prescription;Long Term: Able to explain home exercise prescription to exercise independently             Copy of goals given to participant.

## 2020-09-07 NOTE — Progress Notes (Signed)
Pulmonary Individual Treatment Plan  Patient Details  Name: David Perez MRN: [id number] Date of Birth: 1940/12/25 Referring Provider:     Pulmonary Rehab from 09/07/2020 in [facility] Cardiac and Pulmonary Rehab  Referring Provider Loralie Champagne MD      Initial Encounter Date:    Pulmonary Rehab from 09/07/2020 in [facility] Cardiac and Pulmonary Rehab  Date 09/07/20      Visit Diagnosis: Heart failure, chronic systolic ([place])  Patient's Home Medications on Admission:  Current Outpatient Medications:  .  apixaban (ELIQUIS) 2.5 MG TABS tablet, Take 1 tablet (2.5 mg total) by mouth every 12 (twelve) hours., Disp: 180 tablet, Rfl: 3 .  atorvastatin (LIPITOR) 80 MG tablet, Take 1 tablet (80 mg total) by mouth daily at 6 PM., Disp: 90 tablet, Rfl: 3 .  carvedilol (COREG) 6.25 MG tablet, Take 1 tablet (6.25 mg total) by mouth 2 (two) times daily., Disp: 60 tablet, Rfl: 11 .  megestrol (MEGACE) 20 MG tablet, Take 1 tablet (20 mg total) by mouth daily., Disp: 30 tablet, Rfl: 4 .  sacubitril-valsartan (ENTRESTO) 49-51 MG, Take 1 tablet by mouth 2 (two) times daily., Disp: 180 tablet, Rfl: 1 .  spironolactone (ALDACTONE) 25 MG tablet, Take 1 tablet (25 mg total) by mouth at bedtime., Disp: 90 tablet, Rfl: 3  Past Medical History: Past Medical History:  Diagnosis Date  . 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 Hepatitis B     Tobacco Use: Social History   Tobacco Use  Smoking Status Former Smoker  . Packs/day: 0.50  . Years: 40.00  . Pack years: 20.00  . Quit date: 15  . Years since quitting: 25.7  Smokeless Tobacco Never Used    Labs: Recent Chemical engineer    Labs for ITP Cardiac and Pulmonary Rehab Latest Ref Rng & Units 01/06/2020 01/06/2020 01/07/2020 05/10/2020 08/15/2020   Cholestrol 0 - 200 mg/dL - - - 137 122   LDLCALC 0 - 99 mg/dL - - - 76 65   HDL >39.00 mg/dL - - - 48.60 47.90   Trlycerides 0 - 149 mg/dL - - - 58.0 47.0   Hemoglobin A1c 4.8 - 5.6 % - -  - - -   PHART 7.35 - 7.45 - - - - -   PCO2ART 32 - 48 mmHg - - - - -   HCO3 20.0 - 28.0 mmol/L - - - - -   TCO2 22 - 32 mmol/L - - - - -   ACIDBASEDEF 0.0 - 2.0 mmol/L - - - - -   O2SAT % 52.7 58.4 57.3 - -       Pulmonary Assessment Scores:   UCSD: Self-administered rating of dyspnea associated with activities of daily living (ADLs) 6-point scale (0 = "not at all" to 5 = "maximal or unable to do because of breathlessness")  Scoring Scores range from 0 to 120.  Minimally important difference is 5 units  CAT: CAT can identify the health impairment of COPD patients and is better correlated with disease progression.  CAT has a scoring range of zero to 40. The CAT score is classified into four groups of low (less than 10), medium (10 - 20), high (21-30) and very high (31-40) based on the impact level of disease on health status. A CAT score over 10 suggests significant symptoms.  A worsening CAT score could be explained by an exacerbation, poor medication adherence, poor inhaler technique, or progression of COPD or comorbid conditions.  CAT  MCID is 2 points  mMRC: mMRC (Modified Medical Research Council) Dyspnea Scale is used to assess the degree of baseline functional disability in patients of respiratory disease due to dyspnea. No minimal important difference is established. A decrease in score of 1 point or greater is considered a positive change.   Pulmonary Function Assessment:   Exercise Target Goals: Exercise Program Goal: Individual exercise prescription set using results from initial 6 min walk test and THRR while considering  patient's activity barriers and safety.   Exercise Prescription Goal: Initial exercise prescription builds to 30-45 minutes a day of aerobic activity, 2-3 days per week.  Home exercise guidelines will be given to patient during program as part of exercise prescription that the participant will acknowledge.  Education: Aerobic Exercise & Resistance  Training: - Gives group verbal and written instruction on the various components of exercise. Focuses on aerobic and resistive training programs and the benefits of this training and how to safely progress through these programs..   Pulmonary Rehab from 09/07/2020 in [facility] Cardiac and Pulmonary Rehab  Date 07/27/20  Educator AS  Instruction Review Code 1- Verbalizes Understanding  [employer] training]      Education: Exercise & Equipment Safety: - Individual verbal instruction and demonstration of equipment use and safety with use of the equipment.   Pulmonary Rehab from 09/07/2020 in [facility] Cardiac and Pulmonary Rehab  Date 09/07/20  Educator [facility]  Instruction Review Code 1- Verbalizes Understanding      Education: Exercise Physiology & General Exercise Guidelines: - Group verbal and written instruction with models to review the exercise physiology of the cardiovascular system and associated critical values. Provides general exercise guidelines with specific guidelines to those with heart or lung disease.    Pulmonary Rehab from 09/07/2020 in [facility] Cardiac and Pulmonary Rehab  Education need identified 09/07/20      Education: Flexibility, Balance, Mind/Body Relaxation: Provides group verbal/written instruction on the benefits of flexibility and balance training, including mind/body exercise modes such as yoga, pilates and tai chi.  Demonstration and skill practice provided.   Pulmonary Rehab from 09/07/2020 in [facility] Cardiac and Pulmonary Rehab  Date 08/03/20  Educator AS  Instruction Review Code 1- Verbalizes Understanding      Activity Barriers & Risk Stratification:  Activity Barriers & Cardiac Risk Stratification - 09/07/20 1417      Activity Barriers & Cardiac Risk Stratification   Activity Barriers Deconditioning;Shortness of Breath;Decreased Ventricular Function;Muscular Weakness;Arthritis    Cardiac Risk Stratification High           6 Minute Walk:  6 Minute Walk    Row  Name 06/20/20 1039 09/07/20 1506       6 Minute Walk   Phase Initial Initial    Distance 700 feet 740 feet    Walk Time 6 minutes 6 minutes    # of Rest Breaks 0 0    MPH 1.32 1.4    METS 1.6 1.77    RPE 11 12    Perceived Dyspnea  1 1    VO2 Peak 5.7 6.2    Symptoms No Yes (comment)    Comments -- SOB    Resting HR 92 bpm 72 bpm    Resting BP 98/56 128/66    Resting Oxygen Saturation  100 % 98 %    Exercise Oxygen Saturation  during 6 min walk 98 % 88 %    Max Ex. HR 109 bpm 98 bpm    Max Ex. BP 104/58 134/74  2 Minute Post BP 102/56 124/64      Interval HR   1 Minute HR -- 88    2 Minute HR -- 87    3 Minute HR -- 92    4 Minute HR -- 96    5 Minute HR -- 97    6 Minute HR -- 98    2 Minute Post HR -- 87    Interval Heart Rate? -- Yes      Interval Oxygen   Interval Oxygen? -- Yes    Baseline Oxygen Saturation % -- 98 %    1 Minute Oxygen Saturation % -- 97 %    1 Minute Liters of Oxygen -- 0 L  Room Air    2 Minute Oxygen Saturation % -- 94 %    2 Minute Liters of Oxygen -- 0 L    3 Minute Oxygen Saturation % -- 95 %    3 Minute Liters of Oxygen -- 0 L    4 Minute Oxygen Saturation % -- 88 %    4 Minute Liters of Oxygen -- 0 L    5 Minute Oxygen Saturation % -- 91 %    5 Minute Liters of Oxygen -- 0 L    6 Minute Oxygen Saturation % -- 93 %    6 Minute Liters of Oxygen -- 0 L    2 Minute Post Oxygen Saturation % -- 94 %    2 Minute Post Liters of Oxygen -- 0 L          Oxygen Initial Assessment:  Oxygen Initial Assessment - 09/07/20 1415      Home Oxygen   Home Oxygen Device None    Sleep Oxygen Prescription None    Home Exercise Oxygen Prescription None    Home Resting Oxygen Prescription None      Initial 6 min Walk   Oxygen Used None      Program Oxygen Prescription   Program Oxygen Prescription None      Intervention   Short Term Goals To learn and understand importance of monitoring SPO2 with pulse oximeter and demonstrate accurate use  of the pulse oximeter.;To learn and understand importance of maintaining oxygen saturations>88%;To learn and demonstrate proper pursed lip breathing techniques or other breathing techniques.    Long  Term Goals Verbalizes importance of monitoring SPO2 with pulse oximeter and return demonstration;Maintenance of O2 saturations>88%;Exhibits proper breathing techniques, such as pursed lip breathing or other method taught during program session           Oxygen Re-Evaluation:   Oxygen Discharge (Final Oxygen Re-Evaluation):   Initial Exercise Prescription:  Initial Exercise Prescription - 09/07/20 1500      Date of Initial Exercise RX and Referring Provider   Date 09/07/20    Referring Provider Loralie Champagne MD      Treadmill   MPH 1    Grade 0.5    Minutes 15    METs 1.8      Recumbant Elliptical   Level 1    RPM 50    Minutes 15    METs 1.7      REL-XR   Level 3    Speed 50    Minutes 15    METs 1.7      Prescription Details   Frequency (times per week) 3    Duration Progress to 30 minutes of continuous aerobic without signs/symptoms of physical distress      Intensity   THRR  40-80% of Max Heartrate 99-126    Ratings of Perceived Exertion 11-13    Perceived Dyspnea 0-4      Progression   Progression Continue to progress workloads to maintain intensity without signs/symptoms of physical distress.      Resistance Training   Training Prescription Yes    Weight 3 lb    Reps 10-15           Perform Capillary Blood Glucose checks as needed.  Exercise Prescription Changes:  Exercise Prescription Changes    Row Name 06/20/20 1000 07/06/20 1300 07/20/20 1400 08/02/20 0800 08/17/20 0900     Response to Exercise   Blood Pressure (Admit) 98/56 122/60 104/56 118/58 132/58   Blood Pressure (Exercise) 104/58 118/66 112/58 116/58 136/58   Blood Pressure (Exit) 102/56 116/58 98/52 130/80 120/60   Heart Rate (Admit) 92 bpm 92 bpm 86 bpm 102 bpm 82 bpm   Heart Rate  (Exercise) 109 bpm 103 bpm 103 bpm 119 bpm 90 bpm   Heart Rate (Exit) 99 bpm 90 bpm 82 bpm 78 bpm 83 bpm   Oxygen Saturation (Admit) 100 % -- -- -- --   Oxygen Saturation (Exercise) 98 % -- -- -- --   Rating of Perceived Exertion (Exercise) '11 12 13 13 13   ' Perceived Dyspnea (Exercise) 1 -- -- -- --   Symptoms none none none none none   Duration -- Progress to 30 minutes of  aerobic without signs/symptoms of physical distress Continue with 30 min of aerobic exercise without signs/symptoms of physical distress. Continue with 30 min of aerobic exercise without signs/symptoms of physical distress. Continue with 30 min of aerobic exercise without signs/symptoms of physical distress.   Intensity -- THRR unchanged THRR unchanged THRR unchanged THRR unchanged     Progression   Progression -- Continue to progress workloads to maintain intensity without signs/symptoms of physical distress. Continue to progress workloads to maintain intensity without signs/symptoms of physical distress. Continue to progress workloads to maintain intensity without signs/symptoms of physical distress. Continue to progress workloads to maintain intensity without signs/symptoms of physical distress.   Average METs -- 1.5 2.03 1.73 1.43     Resistance Training   Weight -- 5 lb 5 lb 5 lb 5 lb   Reps -- 10-15 10-15 10-15 10-15     Interval Training   Interval Training -- No No No No     Treadmill   MPH -- -- 1 0.9 1   Grade -- -- 0.5 0.5 0.5   Minutes -- -- '15 15 15   ' METs -- -- 1.8 1.8 1.83     Recumbant Bike   Level -- 3 -- -- --   Minutes -- 15 -- -- --   METs -- 2.5 -- -- --     NuStep   Level -- 1 -- -- 3   SPM -- 80 -- -- --   Minutes -- 15 -- -- 15   METs -- 1.1 -- -- 1.5     REL-XR   Level -- -- '2 1 1   ' Speed -- -- -- 50 --   Minutes -- -- '15 15 15   ' METs -- -- 2.2 1.1 1     Home Exercise Plan   Plans to continue exercise at -- -- Home (comment)  walking Home (comment)  walking Home (comment)   walking   Frequency -- -- Add 2 additional days to program exercise sessions. Add 2 additional days to program exercise sessions.  Add 2 additional days to program exercise sessions.   Initial Home Exercises Provided -- -- 07/18/20 07/18/20 07/18/20   Row Name 08/30/20 0800 09/07/20 1500           Response to Exercise   Blood Pressure (Admit) 134/70 128/66      Blood Pressure (Exercise) 110/68 134/74      Blood Pressure (Exit) 126/70 124/64      Heart Rate (Admit) 81 bpm 72 bpm      Heart Rate (Exercise) 99 bpm 98 bpm      Heart Rate (Exit) 90 bpm 90 bpm      Oxygen Saturation (Admit) -- 98 %      Oxygen Saturation (Exercise) -- 88 %      Oxygen Saturation (Exit) -- 96 %      Rating of Perceived Exertion (Exercise) 15 12      Perceived Dyspnea (Exercise) -- 1      Symptoms none SOB      Comments -- walk test results      Duration Continue with 30 min of aerobic exercise without signs/symptoms of physical distress. --      Intensity THRR unchanged --        Progression   Progression Continue to progress workloads to maintain intensity without signs/symptoms of physical distress. --      Average METs 1.51 --        Resistance Training   Weight 5 lb --      Reps 10-15 --        Interval Training   Interval Training No --        Treadmill   MPH 1 --      Grade 0.5 --      Minutes 15 --      METs 1.83 --        NuStep   Level 3 --      SPM 80 --      Minutes 15 --      METs 1.3 --        REL-XR   Level 3 --      Minutes 15 --      METs 1 --        Home Exercise Plan   Plans to continue exercise at Home (comment)  walking --      Frequency Add 2 additional days to program exercise sessions. --      Initial Home Exercises Provided 07/18/20 --             Exercise Comments:   Exercise Goals and Review:  Exercise Goals    Row Name 06/20/20 1047 09/07/20 1509           Exercise Goals   Increase Physical Activity Yes Yes      Intervention Provide advice,  education, support and counseling about physical activity/exercise needs.;Develop an individualized exercise prescription for aerobic and resistive training based on initial evaluation findings, risk stratification, comorbidities and participant's personal goals. Provide advice, education, support and counseling about physical activity/exercise needs.;Develop an individualized exercise prescription for aerobic and resistive training based on initial evaluation findings, risk stratification, comorbidities and participant's personal goals.      Expected Outcomes Short Term: Attend rehab on a regular basis to increase amount of physical activity.;Long Term: Add in home exercise to make exercise part of routine and to increase amount of physical activity.;Long Term: Exercising regularly at least 3-5 days a week. Short Term: Attend rehab on a  regular basis to increase amount of physical activity.;Long Term: Add in home exercise to make exercise part of routine and to increase amount of physical activity.;Long Term: Exercising regularly at least 3-5 days a week.      Increase Strength and Stamina Yes Yes      Intervention Provide advice, education, support and counseling about physical activity/exercise needs.;Develop an individualized exercise prescription for aerobic and resistive training based on initial evaluation findings, risk stratification, comorbidities and participant's personal goals. Provide advice, education, support and counseling about physical activity/exercise needs.;Develop an individualized exercise prescription for aerobic and resistive training based on initial evaluation findings, risk stratification, comorbidities and participant's personal goals.      Expected Outcomes Short Term: Increase workloads from initial exercise prescription for resistance, speed, and METs.;Short Term: Perform resistance training exercises routinely during rehab and add in resistance training at home;Long Term:  Improve cardiorespiratory fitness, muscular endurance and strength as measured by increased METs and functional capacity (6MWT) Short Term: Increase workloads from initial exercise prescription for resistance, speed, and METs.;Short Term: Perform resistance training exercises routinely during rehab and add in resistance training at home;Long Term: Improve cardiorespiratory fitness, muscular endurance and strength as measured by increased METs and functional capacity (6MWT)      Able to understand and use rate of perceived exertion (RPE) scale Yes Yes      Intervention Provide education and explanation on how to use RPE scale Provide education and explanation on how to use RPE scale      Expected Outcomes Short Term: Able to use RPE daily in rehab to express subjective intensity level;Long Term:  Able to use RPE to guide intensity level when exercising independently Short Term: Able to use RPE daily in rehab to express subjective intensity level;Long Term:  Able to use RPE to guide intensity level when exercising independently      Able to understand and use Dyspnea scale -- Yes      Intervention -- Provide education and explanation on how to use Dyspnea scale      Expected Outcomes -- Long Term: Able to use Dyspnea scale to guide intensity level when exercising independently;Short Term: Able to use Dyspnea scale daily in rehab to express subjective sense of shortness of breath during exertion      Knowledge and understanding of Target Heart Rate Range (THRR) Yes Yes      Intervention Provide education and explanation of THRR including how the numbers were predicted and where they are located for reference Provide education and explanation of THRR including how the numbers were predicted and where they are located for reference      Expected Outcomes Short Term: Able to state/look up THRR;Short Term: Able to use daily as guideline for intensity in rehab;Long Term: Able to use THRR to govern intensity when  exercising independently Short Term: Able to state/look up THRR;Short Term: Able to use daily as guideline for intensity in rehab;Long Term: Able to use THRR to govern intensity when exercising independently      Able to check pulse independently Yes Yes      Intervention Provide education and demonstration on how to check pulse in carotid and radial arteries.;Review the importance of being able to check your own pulse for safety during independent exercise Provide education and demonstration on how to check pulse in carotid and radial arteries.;Review the importance of being able to check your own pulse for safety during independent exercise      Expected Outcomes Short Term:  Able to explain why pulse checking is important during independent exercise;Long Term: Able to check pulse independently and accurately Short Term: Able to explain why pulse checking is important during independent exercise;Long Term: Able to check pulse independently and accurately      Understanding of Exercise Prescription Yes Yes      Intervention Provide education, explanation, and written materials on patient's individual exercise prescription Provide education, explanation, and written materials on patient's individual exercise prescription      Expected Outcomes Short Term: Able to explain program exercise prescription;Long Term: Able to explain home exercise prescription to exercise independently Short Term: Able to explain program exercise prescription;Long Term: Able to explain home exercise prescription to exercise independently             Exercise Goals Re-Evaluation :  Exercise Goals Re-Evaluation    Row Name 06/22/20 8242 07/06/20 1357 07/18/20 0947 07/20/20 1453 08/02/20 0901     Exercise Goal Re-Evaluation   Exercise Goals Review Increase Physical Activity;Able to understand and use rate of perceived exertion (RPE) scale;Knowledge and understanding of Target Heart Rate Range (THRR);Understanding of Exercise  Prescription;Increase Strength and Stamina;Able to check pulse independently Increase Physical Activity;Able to understand and use rate of perceived exertion (RPE) scale;Knowledge and understanding of Target Heart Rate Range (THRR);Understanding of Exercise Prescription;Increase Strength and Stamina;Able to check pulse independently Increase Physical Activity;Increase Strength and Stamina;Able to understand and use rate of perceived exertion (RPE) scale;Knowledge and understanding of Target Heart Rate Range (THRR);Able to check pulse independently;Understanding of Exercise Prescription Increase Physical Activity;Increase Strength and Stamina;Understanding of Exercise Prescription Increase Physical Activity;Increase Strength and Stamina;Understanding of Exercise Prescription   Comments Reviewed RPE and dyspnea scales, THR and program prescription with pt today.  Pt voiced understanding and was given a copy of goals to take home. Al is tolerating exercise well.  Staff is working with him on being comfortable on the recumbent bike. Reviewed home exercise with pt today.  Pt plans to walk and use weights for exercise.  Reviewed THR, pulse, RPE, sign and symptoms, pulse oximetery and when to call 911 or MD.  Also discussed weather considerations and indoor options.  Pt voiced understanding. Al is doing well in rehab.  He is now on level 2 for the XR.  We will continue to monitor his progress. Al attends consistently and has tried different levels on machines.  He felt incline on TM can make him very tired.  Staff will encourage him to increase level on XR and continue to slowly increase TM.   Expected Outcomes Short: Use RPE daily to regulate intensity. Long: Follow program prescription in THR. Short:  continue to attend consistently Long:  build overall strength and stamina Short:  monitor vitals while exercising at home Long:  build overall strength and stamina Short: Continue to increase workloads Long: Continue to  build stamina. Short: increase workload on XR Long: build strength and stamina   Row Name 08/15/20 0954 08/17/20 0953 08/30/20 0838         Exercise Goal Re-Evaluation   Exercise Goals Review Increase Physical Activity;Increase Strength and Stamina;Understanding of Exercise Prescription Increase Physical Activity;Increase Strength and Stamina;Understanding of Exercise Prescription Increase Physical Activity;Increase Strength and Stamina;Understanding of Exercise Prescription     Comments Al felt ok doing 1 mph and .5% grade today.  He is on level 3 on NS.  He is still doing some strength training at home. Al has been doing well in rehab. He has now had a chance to  try all the different machines we have.  He found the only one he couldn't do was the ellipitcal.  He is nearing graduation and should improve on his walk test.  We will continue to montior his progress. Al is nearing graduation. He has increased to level 3 on the XR. He has been preferring the treadmill for primary mode of exercise and tolerates it well. Will continue to monitor.     Expected Outcomes Short: continue to progress workloads Long: build overall stamina Short: improve post 6MWT Long: Continue to improve stamina. Short: Graduate Long: Progress MET level and overall strength/endurance            Discharge Exercise Prescription (Final Exercise Prescription Changes):  Exercise Prescription Changes - 09/07/20 1500      Response to Exercise   Blood Pressure (Admit) 128/66    Blood Pressure (Exercise) 134/74    Blood Pressure (Exit) 124/64    Heart Rate (Admit) 72 bpm    Heart Rate (Exercise) 98 bpm    Heart Rate (Exit) 90 bpm    Oxygen Saturation (Admit) 98 %    Oxygen Saturation (Exercise) 88 %    Oxygen Saturation (Exit) 96 %    Rating of Perceived Exertion (Exercise) 12    Perceived Dyspnea (Exercise) 1    Symptoms SOB    Comments walk test results           Nutrition:  Target Goals: Understanding of  nutrition guidelines, daily intake of sodium <1535m, cholesterol <2035m calories 30% from fat and 7% or less from saturated fats, daily to have 5 or more servings of fruits and vegetables.  Education: Controlling Sodium/Reading Food Labels -Group verbal and written material supporting the discussion of sodium use in heart healthy nutrition. Review and explanation with models, verbal and written materials for utilization of the food label.   Education: General Nutrition Guidelines/Fats and Fiber: -Group instruction provided by verbal, written material, models and posters to present the general guidelines for heart healthy nutrition. Gives an explanation and review of dietary fats and fiber.   [facility]  Education need identified 09/07/20      Biometrics:  Pre Biometrics - 09/07/20 1539      Pre Biometrics   Height 5' 5.5" (1.664 m)    Weight 115 lb 12.8 oz (52.5 kg)    BMI (Calculated) 18.97    Single Leg Stand 1.12 seconds            Nutrition Therapy Plan and Nutrition Goals:  Nutrition Therapy & Goals - 07/04/20 1019      Nutrition Therapy   Diet Heart healthy, low sodium    Protein (specify units) 50g    Fiber 30 grams    Whole Grain Foods 3 servings    Saturated Fats 12 max. grams    Fruits and Vegetables 5 servings/day    Sodium 1.5 grams      Personal Nutrition Goals   Nutrition Goal ST: try easy to prep meals disucssed, add peanut butter to snack LT: Gain muscle    Comments Lifts 8 lb handweights at home. eats more often and says it helps more with digestion and helps hime eat more. B: soft boiled eggs (2) with whole wheat toast or special K protein cereal with skim milk - will have coffee (sweet and low) or juice. Puts protein powder in skim milk or juice. He doesn't drink whole milk because it makes him congested. He  eats soft foods because he has a hard time chewing. S: fruit (blueberries or banana) L:  chicken wings and macaroni salad. D:hamburger from [employer]. He doesn't cook and his wife has passed. Discussed heart healthy eating and easy low prep meals such as rotisserie chicken (with vegetables and grains/potato, with pasta, in soup with low sodium brtoh and frozen veggies, chicken salad), pasta or bean salad using canned or pre-cut vegetables, or pasta with frozen broccoli, jarred sauce, and beans or chicken.      Intervention Plan   Intervention Prescribe, educate and counsel regarding individualized specific dietary modifications aiming towards targeted core components such as weight, hypertension, lipid management, diabetes, heart failure and other comorbidities.;Nutrition handout(s) given to patient.    Expected Outcomes Short Term Goal: Understand basic principles of dietary content, such as calories, fat, sodium, cholesterol and nutrients.;Short Term Goal: A plan has been developed with personal nutrition goals set during dietitian appointment.;Long Term Goal: Adherence to prescribed nutrition plan.           Nutrition Assessments:  Nutrition Assessments - 09/07/20 1544      MEDFICTS Scores   Pre Score 47           MEDIFICTS Score Key:          ?70 Need to make dietary changes          40-70 Heart Healthy Diet         ? 40 Therapeutic Level Cholesterol Diet  Nutrition Goals Re-Evaluation:  Nutrition Goals Re-Evaluation    [place] Name 07/18/20 0948 08/17/20 0919           Goals   Nutrition Goal ST: try easy to prep meals disucssed, add peanut butter to snack LT: Gain muscle ST: Continue to include more home prepped meals to reduce sodium LT: gain muscle, maintain weight      Comment Pt reports not having yet tried ideas, will do so within next goal period. Pt is getting ready to graduate and is at 28 visits. 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 Pt has been preparing some simple meals at home, but admits its sporatic. Pt reports his weight has been stable  and reports added chicken has been helping with his strength. He will continue to add in more meals he prepared himself, but doesn't seem confident in doing so - encouraged pt that he has made progress and to take his goals slow after leaving [name] at rehab.      Expected Outcome ST: try easy to prep meals disucssed, add peanut butter to snack LT: Gain muscle Continue to include more home prepped meals to reduce sodium - stay motivated after rehab             Nutrition Goals Discharge (Final Nutrition Goals Re-Evaluation):  Nutrition Goals Re-Evaluation - 08/17/20 0919      Goals   Nutrition Goal ST: Continue to include more home prepped meals to reduce sodium LT: gain muscle, maintain weight    Comment Pt is getting ready to graduate and is at 28 visits. 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 Pt has been preparing some simple meals at home, but admits its sporatic. Pt reports his weight has been stable and reports added chicken has been helping with his strength. He will continue to add in more meals he prepared himself, but doesn't seem confident in doing so - encouraged pt that he has made progress and to take his  goals slow after leaving [name] at rehab.    Expected Outcome Continue to include more home prepped meals to reduce sodium - stay motivated after rehab           Psychosocial: Target Goals: Acknowledge presence or absence of significant depression and/or stress, maximize coping skills, provide positive support system. Participant is able to verbalize types and ability to use techniques and skills needed for reducing stress and depression.   Education: Depression - Provides group verbal and written instruction on the correlation between heart/lung disease and depressed mood, treatment options, and the stigmas associated with seeking treatment.   Pulmonary Rehab from 09/07/2020 in [facility] Cardiac and Pulmonary Rehab  Date 07/06/20  Educator SB  Instruction Review Code 1-  [employer] Understanding      Education: Sleep Hygiene -Provides group verbal and written instruction about how sleep can affect your health.  Define sleep hygiene, discuss sleep cycles and impact of sleep habits. Review good sleep hygiene tips.    Education: Stress and Anxiety: - Provides group verbal and written instruction about the health risks of elevated stress and causes of high stress.  Discuss the correlation between heart/lung disease and anxiety and treatment options. Review healthy ways to manage with stress and anxiety.   Initial Review & Psychosocial Screening:  Initial Psych Review & Screening - 09/07/20 1415      Initial Review   Current issues with None Identified    Source of Stress Concerns None Identified      Family Dynamics   Good Support System? Yes   daughters, [name] lives the closest     Barriers   Psychosocial barriers to participate in program The patient should benefit from training in stress management and relaxation.;There are no identifiable barriers or psychosocial needs.      Screening Interventions   Interventions Provide feedback about the scores to participant;Encouraged to exercise;To provide support and resources with identified psychosocial needs    Expected Outcomes Short Term goal: Utilizing psychosocial counselor, staff and physician to assist with identification of specific Stressors or current issues interfering with healing process. Setting desired goal for each stressor or current issue identified.;Long Term Goal: Stressors or current issues are controlled or eliminated.;Short Term goal: Identification and review with participant of any Quality of Life or Depression concerns found by scoring the questionnaire.;Long Term goal: The participant improves quality of Life and PHQ9 Scores as seen by post scores and/or verbalization of changes           Quality of Life Scores:  Quality of Life - 06/20/20 1056      Quality of Life   Select  Quality of Life      Quality of Life Scores   Health/Function Pre 23.5 %    Socioeconomic Pre 27.5 %    Psych/Spiritual Pre 26.79 %    Family Pre 29.38 %    GLOBAL Pre 25.81 %          Scores of 19 and below usually indicate a poorer quality of life in these areas.  A difference of  2-3 points is a clinically meaningful difference.  A difference of 2-3 points in the total score of the Quality of Life Index has been associated with significant improvement in overall quality of life, self-image, physical symptoms, and general health in studies assessing change in quality of life.  PHQ-9: Recent Review Flowsheet Data    Depression screen [facility] 2/9 09/07/2020 08/15/2020 06/29/2020 06/20/2020 05/10/2020   Decreased Interest 0 0  0 0 0   Down, Depressed, Hopeless 0 0 0 0 0   PHQ - 2 Score 0 0 0 0 0   Altered sleeping 0 - 0 0 0   Tired, decreased energy 0 - 0 0 0   Change in appetite 0 - 0 0 0   Feeling bad or failure about yourself  0 - 0 0 0   Trouble concentrating 0 - 0 0 0   Moving slowly or fidgety/restless 0 - 0 0 0   Suicidal thoughts 0 - 0 0 0   PHQ-9 Score 0 - 0 0 0   Difficult doing work/chores Not difficult at all - - - Not difficult at all     Interpretation of Total Score  Total Score Depression Severity:  1-4 = Minimal depression, 5-9 = Mild depression, 10-14 = Moderate depression, 15-19 = Moderately severe depression, 20-27 = Severe depression   Psychosocial Evaluation and Intervention:  Psychosocial Evaluation - 09/07/20 1539      Psychosocial Evaluation & Interventions   Interventions Encouraged to exercise with the program and follow exercise prescription    Comments Al is now returning to Pulmonary Rehab after finishing cardiac rehab.  He is coming in with heart failure and SOB.  He noticed how much cardiac rehab helped him and wanted to continue to exercise.  He has had a recent medication change and wants to combine it with his rehab to continue to feel better.  He feels  that he is back on track to get back to where he wants to be.  He has also started to take the stairs after rehab to help rebuild his stamina.    Expected Outcomes Short: Attend pulmonary rehab and education classes  Long: Continue to focus on positives.           Psychosocial Re-Evaluation:  Psychosocial Re-Evaluation    [place] Name 07/18/20 0930 08/15/20 0956           Psychosocial Re-Evaluation   Current issues with Current Stress Concerns Current Stress Concerns      Comments Al has no stress or sleep concerns at this time. No stress or sleep concerns.      Expected Outcomes Short:  attend cardiac rehab for the exercise Long: maintain positive outlook Short: continue to exercise Long: stay positive             Psychosocial Discharge (Final Psychosocial Re-Evaluation):  Psychosocial Re-Evaluation - 08/15/20 0956      Psychosocial Re-Evaluation   Current issues with Current Stress Concerns    Comments No stress or sleep concerns.    Expected Outcomes Short: continue to exercise Long: stay positive           Education: Education Goals: Education classes will be provided on a weekly basis, covering required topics. Participant will state understanding/return demonstration of topics presented.  Learning Barriers/Preferences:  Learning Barriers/Preferences - 09/07/20 1416      Learning Barriers/Preferences   Learning Barriers None    Learning Preferences None           General Pulmonary Education Topics:  Infection Prevention: - Provides verbal and written material to individual with discussion of infection control including proper hand washing and proper equipment cleaning during exercise session.   Pulmonary Rehab from 09/07/2020 in [facility] Cardiac and Pulmonary Rehab  Date 09/07/20  Educator [facility]  Instruction Review Code 1- Verbalizes Understanding      Falls Prevention: - Provides verbal and written material to individual with  discussion of falls prevention and  safety.   Pulmonary Rehab from 09/07/2020 in [facility] Cardiac and Pulmonary Rehab  Date 09/07/20  Educator [facility]  Instruction Review Code 1- Verbalizes Understanding      Chronic Lung Diseases: - Group verbal and written instruction to review updates, respiratory medications, advancements in procedures and treatments. Discuss use of supplemental oxygen including available portable oxygen systems, continuous and intermittent flow rates, concentrators, personal use and safety guidelines. Review proper use of inhaler and spacers. Provide informative websites for self-education.    Pulmonary Rehab from 09/07/2020 in [facility] Cardiac and Pulmonary Rehab  Date 06/22/20  Educator [facility]  Instruction Review Code 1- Verbalizes Understanding      Energy Conservation: - Provide group verbal and written instruction for methods to conserve energy, plan and organize activities. Instruct on pacing techniques, use of adaptive equipment and posture/positioning to relieve shortness of breath.   Pulmonary Rehab from 09/07/2020 in [facility] Cardiac and Pulmonary Rehab  Date 08/31/20  Educator [facility]  Instruction Review Code 1- Verbalizes Understanding      Triggers and Exacerbations: - Group verbal and written instruction to review types of environmental triggers and ways to prevent exacerbations. Discuss weather changes, air quality and the benefits of nasal washing. Review warning signs and symptoms to help prevent infections. Discuss techniques for effective airway clearance, coughing, and vibrations.   Pulmonary Rehab from 09/07/2020 in [facility] Cardiac and Pulmonary Rehab  Date 08/31/20  Educator [facility]  Instruction Review Code 1- Verbalizes Understanding      AED/CPR: - Group verbal and written instruction with the use of models to demonstrate the basic use of the AED with the basic ABC's of resuscitation.   Anatomy and Physiology of the Lungs: - Group verbal and written instruction with the use of models to provide basic  lung anatomy and physiology related to function, structure and complications of lung disease.   Pulmonary Rehab from 09/07/2020 in [facility] Cardiac and Pulmonary Rehab  Date 08/31/20  Educator [facility]  Instruction Review Code 1- Verbalizes Understanding      Anatomy & Physiology of the Heart: - Group verbal and written instruction and models provide basic cardiac anatomy and physiology, with the coronary electrical and arterial systems. Review of Valvular disease and Heart Failure   Cardiac Medications: - Group verbal and written instruction to review commonly prescribed medications for heart disease. Reviews the medication, class of the drug, and side effects.   Pulmonary Rehab from 09/07/2020 in [facility] Cardiac and Pulmonary Rehab  Date 08/17/20  Educator SB  Instruction Review Code 1- Verbalizes Understanding      Other: -Provides group and verbal instruction on various topics (see comments)   Knowledge Questionnaire Score:  Knowledge Questionnaire Score - 09/07/20 1548      Knowledge Questionnaire Score   Pre Score 12/18 Education Focus: Lung Disease, O2 safety, Nutrition            Core Components/Risk Factors/Patient Goals at Admission:  Personal Goals and Risk Factors at Admission - 09/07/20 1549      Core Components/Risk Factors/Patient Goals on Admission    Weight Management Yes;Weight Maintenance    Intervention Weight Management: Develop a combined nutrition and exercise program designed to reach desired caloric intake, while maintaining appropriate intake of nutrient and fiber, sodium and fats, and appropriate energy expenditure required for the weight goal.;Weight Management: Provide education and appropriate resources to help participant work on and attain dietary goals.    Admit Weight 115 lb 12.8 oz (52.5 kg)  Goal Weight: Short Term 115 lb (52.2 kg)    Goal Weight: Long Term 115 lb (52.2 kg)    Expected Outcomes Short Term: Continue to assess and modify interventions  until short term weight is achieved;Long Term: Adherence to nutrition and physical activity/exercise program aimed toward attainment of established weight goal;Weight Maintenance: Understanding of the daily nutrition guidelines, which includes 25-35% calories from fat, 7% or less cal from saturated fats, less than 259m cholesterol, less than 1.5gm of sodium, & 5 or more servings of fruits and vegetables daily    Improve shortness of breath with ADL's Yes    Intervention Provide education, individualized exercise plan and daily activity instruction to help decrease symptoms of SOB with activities of daily living.    Expected Outcomes Short Term: Improve cardiorespiratory fitness to achieve a reduction of symptoms when performing ADLs;Long Term: Be able to perform more ADLs without symptoms or delay the onset of symptoms    Heart Failure Yes    Intervention Provide a combined exercise and nutrition program that is supplemented with education, support and counseling about heart failure. Directed toward relieving symptoms such as shortness of breath, decreased exercise tolerance, and extremity edema.    Expected Outcomes Improve functional capacity of life;Short term: Daily weights obtained and reported for increase. Utilizing diuretic protocols set by physician.;Short term: Attendance in program 2-3 days a week with increased exercise capacity. Reported lower sodium intake. Reported increased fruit and vegetable intake. Reports medication compliance.;Long term: Adoption of self-care skills and reduction of barriers for early signs and symptoms recognition and intervention leading to self-care maintenance.    Hypertension Yes    Intervention Monitor prescription use compliance.;Provide education on lifestyle modifcations including regular physical activity/exercise, weight management, moderate sodium restriction and increased consumption of fresh fruit, vegetables, and low fat dairy, alcohol moderation, and  smoking cessation.    Expected Outcomes Short Term: Continued assessment and intervention until BP is < 140/971mHG in hypertensive participants. < 130/8081mG in hypertensive participants with diabetes, heart failure or chronic kidney disease.;Long Term: Maintenance of blood pressure at goal levels.    Lipids Yes    Intervention Provide education and support for participant on nutrition & aerobic/resistive exercise along with prescribed medications to achieve LDL <56m64mDL >40mg50m Expected Outcomes Short Term: Participant states understanding of desired cholesterol values and is compliant with medications prescribed. Participant is following exercise prescription and nutrition guidelines.;Long Term: Cholesterol controlled with medications as prescribed, with individualized exercise RX and with personalized nutrition plan. Value goals: LDL < 56mg,62m > 40 mg.           Education:Diabetes - Individual verbal and written instruction to review signs/symptoms of diabetes, desired ranges of glucose level fasting, after meals and with exercise. Acknowledge that pre and post exercise glucose checks will be done for 3 sessions at entry of program.   Education: Know Your Numbers and Risk Factors: -Group verbal and written instruction about important numbers in your health.  Discussion of what are risk factors and how they play a role in the disease process.  Review of Cholesterol, Blood Pressure, Diabetes, and BMI and the role they play in your overall health.   [facility]  Date 07/13/20  [facility]- Verbalizes Understanding      Core Components/Risk Factors/Patient Goals Review:   Goals and Risk Factor Review    [place] Name 07/18/20 0927 [id number]  Core Components/Risk Factors/Patient Goals Review   Personal Goals Review Weight Management/Obesity;Hypertension;Heart Failure;Lipids Weight  Management/Obesity;Hypertension;Heart Failure;Lipids      Review Al says he can come in feeling sluggish and leave "feeling like a million bucks". He reports taking all medications as directed.   He has no had any heart failure symptoms. Al is taking meds as directed.  He does feel a little tired today.  He has not had any HF symptoms.  He can do day to day activities without fatigue.  He sees his Dr today.      Expected Outcomes Short: continue to take meds and attend consistently Long:  manage risk factors Short:  conitnue current exercise and medication regimen Long: keep risk factors under control             Core Components/Risk Factors/Patient Goals at Discharge (Final Review):   Goals and Risk Factor Review - 08/15/20 0951      Core Components/Risk Factors/Patient Goals Review   Personal Goals Review Weight Management/Obesity;Hypertension;Heart Failure;Lipids    Review Al is taking meds as directed.  He does feel a little tired today.  He has not had any HF symptoms.  He can do day to day activities without fatigue.  He sees his Dr today.    Expected Outcomes Short:  conitnue current exercise and medication regimen Long: keep risk factors under control           ITP Comments:  ITP Comments    Row Name 06/14/20 0949 06/20/20 1102 06/22/20 0938 07/13/20 0622 07/25/20 1005   ITP Comments Virtual Visit completed. Patient informed on EP and RD appointment and 6 Minute walk test. Patient also informed of patient health questionnaires on My Chart. Patient Verbalizes understanding. Visit diagnosis can be found in [facility] 12/31/2019. Completed 6MWT and gym orientation. Initial ITP created and sent for review to Dr. Emily Filbert, Medical Director. First full day of exercise!  Patient was oriented to gym and equipment including functions, settings, policies, and procedures.  Patient's individual exercise prescription and treatment plan were reviewed.  All starting workloads were established based on the  results of the 6 minute walk test done at initial orientation visit.  The plan for exercise progression was also introduced and progression will be customized based on patient's performance and goals 30 Day review completed. Medical Director ITP review done, changes made as directed, and signed approval by Medical Director. Al is concerned that today his breathing is harder than ususal. He has no weight gain noted. Med changes occured early this month He was advised totalk to his physician about the concern. He verbalized understanding that he should call his physician.   [place] Name 08/10/20 1601 08/31/20 1554 09/07/20 1503       ITP Comments 30 day review completed. ITP sent to Dr. Emily Filbert, Medical Director of Cardiac and Pulmonary Rehab. Continue with ITP unless changes are made by physician. Al has completed all 36 sessions today. He is discharged from the program. Completed 6MWT and gym orientation. Initial ITP created and sent for review to Dr. Emily Filbert, Medical Director.            Comments: Initial ITP

## 2020-09-08 ENCOUNTER — Telehealth (HOSPITAL_COMMUNITY): Payer: Self-pay | Admitting: Pharmacist

## 2020-09-08 ENCOUNTER — Encounter: Payer: Medicare Other | Attending: Cardiology | Admitting: *Deleted

## 2020-09-08 ENCOUNTER — Other Ambulatory Visit: Payer: Self-pay

## 2020-09-08 DIAGNOSIS — I5022 Chronic systolic (congestive) heart failure: Secondary | ICD-10-CM | POA: Diagnosis not present

## 2020-09-08 DIAGNOSIS — Z951 Presence of aortocoronary bypass graft: Secondary | ICD-10-CM | POA: Insufficient documentation

## 2020-09-08 MED ORDER — SACUBITRIL-VALSARTAN 49-51 MG PO TABS: 1.0000 | ORAL_TABLET | Freq: Two times a day (BID) | ORAL | 1 refills | Status: AC

## 2020-09-08 NOTE — Telephone Encounter (Signed)
Spoke to [employer] and informed them about Entresto dose increase. Although he receives the medication from [employer], they help him fill his pill packs and requested new prescription for his file.    Audry Riles, PharmD, BCPS, BCCP, CPP Heart Failure Clinic Pharmacist [phone]

## 2020-09-08 NOTE — Progress Notes (Signed)
Daily Session Note  Patient Details  Name: David Perez MRN: [id number] Date of Birth: [birth year] Referring Provider:     Pulmonary Rehab from 09/07/2020 in [facility] Cardiac and Pulmonary Rehab  Referring Provider Loralie Champagne MD      Encounter Date: 09/08/2020  Check In:  Session Check In - 09/08/20 1730      Check-In   Supervising physician immediately available to respond to emergencies See telemetry face sheet for immediately available ER MD    Location [facility]-Cardiac & Pulmonary Rehab    Staff Present Renita Papa, RN BSN;Joseph Lou Miner, [place] Exercise Physiologist    Virtual Visit No    Medication changes reported     No    Fall or balance concerns reported    No    Warm-up and Cool-down Performed on first and last piece of equipment    Resistance Training Performed Yes    VAD Patient? No    PAD/SET Patient? No      Pain Assessment   Currently in Pain? No/denies              Social History   Tobacco Use  Smoking Status Former Smoker  . Packs/day: 0.50  . Years: 40.00  . Pack years: 20.00  . Quit date: 72  . Years since quitting: 25.7  Smokeless Tobacco Never Used    Goals Met:  Independence with exercise equipment Exercise tolerated well No report of cardiac concerns or symptoms Strength training completed today  Goals Unmet:  Not Applicable  Comments: First full day of exercise!  Patient was oriented to gym and equipment including functions, settings, policies, and procedures.  Patient's individual exercise prescription and treatment plan were reviewed.  All starting workloads were established based on the results of the 6 minute walk test done at initial orientation visit.  The plan for exercise progression was also introduced and progression will be customized based on patient's performance and goals.     Dr. Emily Filbert is Medical Director for [place] and [name].

## 2020-09-12 ENCOUNTER — Other Ambulatory Visit: Payer: Self-pay

## 2020-09-12 ENCOUNTER — Encounter: Payer: Medicare Other | Admitting: *Deleted

## 2020-09-12 DIAGNOSIS — Z951 Presence of aortocoronary bypass graft: Secondary | ICD-10-CM | POA: Diagnosis present

## 2020-09-12 DIAGNOSIS — I5022 Chronic systolic (congestive) heart failure: Secondary | ICD-10-CM

## 2020-09-12 NOTE — Progress Notes (Signed)
Daily Session Note  Patient Details  Name: David Perez MRN: [id number] Date of Birth: Jun 01, 1941 Referring Provider:     Pulmonary Rehab from 09/07/2020 in [facility] Cardiac and Pulmonary Rehab  Referring Provider Loralie Champagne MD      Encounter Date: 09/12/2020  Check In:  Session Check In - 09/12/20 1034      Check-In   Supervising physician immediately available to respond to emergencies See telemetry face sheet for immediately available ER MD    Location [facility]-Cardiac & Pulmonary Rehab    Staff Present Renita Papa, RN BSN;Joseph [address] [place], [place], ACSM CEP, Exercise Physiologist    Virtual Visit No    Medication changes reported     No    Fall or balance concerns reported    No    Warm-up and Cool-down Performed on first and last piece of equipment    Resistance Training Performed Yes    VAD Patient? No    PAD/SET Patient? No      Pain Assessment   Currently in Pain? No/denies              Social History   Tobacco Use  Smoking Status Former Smoker  . Packs/day: 0.50  . Years: 40.00  . Pack years: 20.00  . Quit date: 42  . Years since quitting: 25.7  Smokeless Tobacco Never Used    Goals Met:  Independence with exercise equipment Exercise tolerated well No report of cardiac concerns or symptoms Strength training completed today  Goals Unmet:  Not Applicable  Comments: Pt able to follow exercise prescription today without complaint.  Will continue to monitor for progression.    Dr. Emily Filbert is Medical Director for [place] and [name].

## 2020-09-14 ENCOUNTER — Other Ambulatory Visit: Payer: Self-pay

## 2020-09-14 ENCOUNTER — Encounter: Payer: Medicare Other | Admitting: *Deleted

## 2020-09-14 DIAGNOSIS — I5022 Chronic systolic (congestive) heart failure: Secondary | ICD-10-CM

## 2020-09-14 NOTE — Progress Notes (Signed)
Daily Session Note  Patient Details  Name: David Perez MRN: [id number] Date of Birth: February 25, 1941 Referring Provider:     Pulmonary Rehab from 09/07/2020 in [facility] Cardiac and Pulmonary Rehab  Referring Provider Loralie Champagne MD      Encounter Date: 09/14/2020  Check In:  Session Check In - 09/14/20 1103      Check-In   Supervising physician immediately available to respond to emergencies See telemetry face sheet for immediately available ER MD    Location [facility]Cardiac & Pulmonary Rehab    Staff Present Renita Papa, RN BSN;Joseph [address] [place], [place], [place], CCRP, CCET;Melissa Whittingham RDN, [place], [place], ACSM CEP, Exercise Physiologist    Virtual Visit No    Medication changes reported     No    Fall or balance concerns reported    No    Warm-up and Cool-down Performed on first and last piece of equipment    Resistance Training Performed Yes    VAD Patient? No    PAD/SET Patient? No      Pain Assessment   Currently in Pain? No/denies             Exercise Prescription Changes - 09/14/20 0900      Response to Exercise   Blood Pressure (Admit) 110/58    Blood Pressure (Exercise) 118/56    Blood Pressure (Exit) 92/60    Heart Rate (Admit) 90 bpm    Heart Rate (Exercise) 90 bpm    Heart Rate (Exit) 73 bpm    Oxygen Saturation (Admit) 97 %    Oxygen Saturation (Exercise) 97 %    Oxygen Saturation (Exit) 97 %    Rating of Perceived Exertion (Exercise) 12    Perceived Dyspnea (Exercise) 1    Symptoms SOB    Duration Continue with 30 min of aerobic exercise without signs/symptoms of physical distress.    Intensity THRR unchanged      Progression   Progression Continue to progress workloads to maintain intensity without signs/symptoms of physical distress.    Average METs 1.8      Resistance Training   Training Prescription Yes    Weight 3 lb    Reps 10-15      Interval Training   Interval Training No      Treadmill   MPH 1     Grade 0.5    Minutes 15    METs 1.8      Recumbant Elliptical   Level 1    Minutes 15    METs 1.8      REL-XR   Level 1    Minutes 15           Social History   Tobacco Use  Smoking Status Former Smoker  . Packs/day: 0.50  . Years: 40.00  . Pack years: 20.00  . Quit date: 39  . Years since quitting: 25.8  Smokeless Tobacco Never Used    Goals Met:  Independence with exercise equipment Exercise tolerated well No report of cardiac concerns or symptoms Strength training completed today  Goals Unmet:  Not Applicable  Comments: Pt able to follow exercise prescription today without complaint.  Will continue to monitor for progression.    Dr. Emily Filbert is Medical Director for [place] and [name].

## 2020-09-15 ENCOUNTER — Telehealth: Payer: Self-pay | Admitting: Nurse Practitioner

## 2020-09-15 NOTE — Telephone Encounter (Signed)
Forms received and fixed; forms refaxed back

## 2020-09-15 NOTE — Telephone Encounter (Signed)
Anderson Malta from [facility] called need verbal order signed she is faxing it today.

## 2020-09-16 ENCOUNTER — Ambulatory Visit (HOSPITAL_COMMUNITY)
Admission: RE | Admit: 2020-09-16 | Discharge: 2020-09-16 | Disposition: A | Discharge status: Home / Self Care | Payer: Medicare Other | Source: Ambulatory Visit | Attending: Internal Medicine | Admitting: Internal Medicine

## 2020-09-16 ENCOUNTER — Encounter: Payer: Medicare Other | Admitting: *Deleted

## 2020-09-16 ENCOUNTER — Other Ambulatory Visit: Payer: Self-pay

## 2020-09-16 DIAGNOSIS — I5022 Chronic systolic (congestive) heart failure: Secondary | ICD-10-CM | POA: Diagnosis not present

## 2020-09-16 LAB — BASIC METABOLIC PANEL
Anion gap: 9 (ref 5–15)
BUN: 25 mg/dL — ABNORMAL HIGH (ref 8–23)
CO2: 21 mmol/L — ABNORMAL LOW (ref 22–32)
Calcium: 9.7 mg/dL (ref 8.9–10.3)
Chloride: 112 mmol/L — ABNORMAL HIGH (ref 98–111)
Creatinine, Ser: 1.27 mg/dL — ABNORMAL HIGH (ref 0.61–1.24)
GFR, Estimated: 53 mL/min — ABNORMAL LOW (ref >60)
Glucose, Bld: 110 mg/dL — ABNORMAL HIGH (ref 70–99)
Potassium: 4.6 mmol/L (ref 3.5–5.1)
Sodium: 142 mmol/L (ref 135–145)

## 2020-09-16 NOTE — Progress Notes (Signed)
Daily Session Note  Patient Details  Name: Keanen Dohse MRN: [id number] Date of Birth: 1941/03/27 Referring Provider:     Pulmonary Rehab from 09/07/2020 in [facility] Cardiac and Pulmonary Rehab  Referring Provider Loralie Champagne MD      Encounter Date: 09/16/2020  Check In:  Session Check In - 09/16/20 1003      Check-In   Supervising physician immediately available to respond to emergencies See telemetry face sheet for immediately available ER MD    Location [facility]Cardiac & Pulmonary Rehab    Staff Present Renita Papa, RN BSN;Joseph [address] [place], [place], RCEP, CCRP, CCET    Virtual Visit No    Medication changes reported     No    Fall or balance concerns reported    No    Warm-up and Cool-down Performed on first and last piece of equipment    Resistance Training Performed Yes    VAD Patient? No    PAD/SET Patient? No      Pain Assessment   Currently in Pain? No/denies              Social History   Tobacco Use  Smoking Status Former Smoker  . Packs/day: 0.50  . Years: 40.00  . Pack years: 20.00  . Quit date: 10  . Years since quitting: 25.8  Smokeless Tobacco Never Used    Goals Met:  Independence with exercise equipment Exercise tolerated well No report of cardiac concerns or symptoms Strength training completed today  Goals Unmet:  Not Applicable  Comments: Pt able to follow exercise prescription today without complaint.  Will continue to monitor for progression.    Dr. Emily Filbert is Medical Director for [place] and [name].

## 2020-09-19 ENCOUNTER — Other Ambulatory Visit: Payer: Self-pay

## 2020-09-19 ENCOUNTER — Encounter: Payer: Medicare Other | Admitting: *Deleted

## 2020-09-19 DIAGNOSIS — I5022 Chronic systolic (congestive) heart failure: Secondary | ICD-10-CM

## 2020-09-19 NOTE — Progress Notes (Signed)
Daily Session Note  Patient Details  Name: Knut Rondinelli MRN: [id number] Date of Birth: 1940-12-27 Referring Provider:     Pulmonary Rehab from 09/07/2020 in [facility] Cardiac and Pulmonary Rehab  Referring Provider Loralie Champagne MD      Encounter Date: 09/19/2020  Check In:  Session Check In - 09/19/20 1032      Check-In   Supervising physician immediately available to respond to emergencies See telemetry face sheet for immediately available ER MD    Location [facility]Cardiac & Pulmonary Rehab    Staff Present Renita Papa, RN BSN;Joseph [address] [place], [place], ACSM CEP, Exercise Physiologist    Virtual Visit No    Medication changes reported     No    Fall or balance concerns reported    No    Warm-up and Cool-down Performed on first and last piece of equipment    Resistance Training Performed Yes    VAD Patient? No    PAD/SET Patient? No      Pain Assessment   Currently in Pain? No/denies              Social History   Tobacco Use  Smoking Status Former Smoker  . Packs/day: 0.50  . Years: 40.00  . Pack years: 20.00  . Quit date: 9  . Years since quitting: 25.8  Smokeless Tobacco Never Used    Goals Met:  Independence with exercise equipment Exercise tolerated well No report of cardiac concerns or symptoms Strength training completed today  Goals Unmet:  Not Applicable  Comments: Pt able to follow exercise prescription today without complaint.  Will continue to monitor for progression.    Dr. Emily Filbert is Medical Director for [place] and [name].

## 2020-09-21 ENCOUNTER — Encounter: Payer: Medicare Other | Admitting: *Deleted

## 2020-09-21 ENCOUNTER — Other Ambulatory Visit: Payer: Self-pay

## 2020-09-21 DIAGNOSIS — I5022 Chronic systolic (congestive) heart failure: Secondary | ICD-10-CM | POA: Diagnosis not present

## 2020-09-21 NOTE — Progress Notes (Signed)
Daily Session Note  Patient Details  Name: David Perez MRN: [id number] Date of Birth: 1941/05/05 Referring Provider:     Pulmonary Rehab from 09/07/2020 in [facility] Cardiac and Pulmonary Rehab  Referring Provider Loralie Champagne MD      Encounter Date: 09/21/2020  Check In:  Session Check In - 09/21/20 1127      Check-In   Supervising physician immediately available to respond to emergencies See telemetry face sheet for immediately available ER MD    Location [facility]-Cardiac & Pulmonary Rehab    Staff Present Heath Lark, RN, BSN, CCRP;Kelly [place], MPA, RN;Amanda Sommer, BA, ACSM CEP, Exercise Physiologist;Joseph [employer], [place], RCEP, CCRP, CCET    Virtual Visit No    Medication changes reported     No    Fall or balance concerns reported    No    Warm-up and Cool-down Performed on first and last piece of equipment    Resistance Training Performed Yes    VAD Patient? No    PAD/SET Patient? No      Pain Assessment   Currently in Pain? No/denies              Social History   Tobacco Use  Smoking Status Former Smoker  . Packs/day: 0.50  . Years: 40.00  . Pack years: 20.00  . Quit date: 21  . Years since quitting: 25.8  Smokeless Tobacco Never Used    Goals Met:  Proper associated with RPD/PD & O2 Sat Independence with exercise equipment Exercise tolerated well No report of cardiac concerns or symptoms  Goals Unmet:  Not Applicable  Comments: Pt able to follow exercise prescription today without complaint.  Will continue to monitor for progression.    Dr. Emily Filbert is Medical Director for [place] and [name].

## 2020-09-23 ENCOUNTER — Other Ambulatory Visit: Payer: Self-pay

## 2020-09-23 ENCOUNTER — Encounter: Payer: Medicare Other | Admitting: *Deleted

## 2020-09-23 DIAGNOSIS — I5022 Chronic systolic (congestive) heart failure: Secondary | ICD-10-CM | POA: Diagnosis not present

## 2020-09-23 NOTE — Progress Notes (Signed)
Daily Session Note  Patient Details  Name: David Perez MRN: [id number] Date of Birth: 07-03-1941 Referring Provider:     Pulmonary Rehab from 09/07/2020 in [facility] Cardiac and Pulmonary Rehab  Referring Provider Loralie Champagne MD      Encounter Date: 09/23/2020  Check In:  Session Check In - 09/23/20 1021      Check-In   Supervising physician immediately available to respond to emergencies See telemetry face sheet for immediately available ER MD    Location [facility]Cardiac & Pulmonary Rehab    Staff Present Renita Papa, RN BSN;Joseph [address] [place], [place], [place], CCRP, CCET    Virtual Visit No    Medication changes reported     No    Fall or balance concerns reported    No    Warm-up and Cool-down Performed on first and last piece of equipment    Resistance Training Performed Yes    VAD Patient? No    PAD/SET Patient? No      Pain Assessment   Currently in Pain? No/denies              Social History   Tobacco Use  Smoking Status Former Smoker  . Packs/day: 0.50  . Years: 40.00  . Pack years: 20.00  . Quit date: 8  . Years since quitting: 25.8  Smokeless Tobacco Never Used    Goals Met:  Independence with exercise equipment Exercise tolerated well No report of cardiac concerns or symptoms Strength training completed today  Goals Unmet:  Not Applicable  Comments: Pt able to follow exercise prescription today without complaint.  Will continue to monitor for progression.    Dr. Emily Filbert is Medical Director for [place] and [name].

## 2020-09-26 ENCOUNTER — Other Ambulatory Visit: Payer: Self-pay

## 2020-09-26 ENCOUNTER — Encounter: Payer: Medicare Other | Admitting: *Deleted

## 2020-09-26 DIAGNOSIS — Z951 Presence of aortocoronary bypass graft: Secondary | ICD-10-CM

## 2020-09-26 DIAGNOSIS — I5022 Chronic systolic (congestive) heart failure: Secondary | ICD-10-CM

## 2020-09-26 NOTE — Progress Notes (Signed)
Daily Session Note  Patient Details  Name: Alassane Kalafut MRN: [id number] Date of Birth: 05-26-1941 Referring Provider:     Pulmonary Rehab from 09/07/2020 in [facility] Cardiac and Pulmonary Rehab  Referring Provider Loralie Champagne MD      Encounter Date: 09/26/2020  Check In:  Session Check In - 09/26/20 1032      Check-In   Supervising physician immediately available to respond to emergencies See telemetry face sheet for immediately available ER MD    Location [facility]-Cardiac & Pulmonary Rehab    Staff Present Renita Papa, RN BSN;Joseph [address] [place], [place], ACSM CEP, Exercise Physiologist    Virtual Visit No    Medication changes reported     No    Fall or balance concerns reported    No    Warm-up and Cool-down Performed on first and last piece of equipment    Resistance Training Performed Yes    VAD Patient? No    PAD/SET Patient? No      Pain Assessment   Currently in Pain? No/denies              Social History   Tobacco Use  Smoking Status Former Smoker  . Packs/day: 0.50  . Years: 40.00  . Pack years: 20.00  . Quit date: 79  . Years since quitting: 25.8  Smokeless Tobacco Never Used    Goals Met:  Independence with exercise equipment Exercise tolerated well No report of cardiac concerns or symptoms Strength training completed today  Goals Unmet:  Not Applicable  Comments: Pt able to follow exercise prescription today without complaint.  Will continue to monitor for progression.    Dr. Emily Filbert is Medical Director for [place] and [name].

## 2020-09-28 ENCOUNTER — Encounter: Payer: Medicare Other | Admitting: *Deleted

## 2020-09-28 ENCOUNTER — Other Ambulatory Visit: Payer: Self-pay

## 2020-09-28 DIAGNOSIS — I5022 Chronic systolic (congestive) heart failure: Secondary | ICD-10-CM | POA: Diagnosis not present

## 2020-09-28 NOTE — Progress Notes (Signed)
Daily Session Note  Patient Details  Name: David Perez MRN: [id number] Date of Birth: [birth year] Referring Provider:     Pulmonary Rehab from 09/07/2020 in [facility] Cardiac and Pulmonary Rehab  Referring Provider Loralie Champagne MD      Encounter Date: 09/28/2020  Check In:  Session Check In - 09/28/20 1210      Check-In   Supervising physician immediately available to respond to emergencies See telemetry face sheet for immediately available ER MD    Location [facility]-Cardiac & Pulmonary Rehab    Staff Present Nada Maclachlan, BA, ACSM CEP, Exercise Physiologist;Krista Frederico Hamman, RN BSN;Joseph Hood RCP,RRT,BSRT    Virtual Visit No    Medication changes reported     No    Warm-up and Cool-down Performed on first and last piece of equipment    Resistance Training Performed Yes    VAD Patient? No    PAD/SET Patient? No      Pain Assessment   Currently in Pain? No/denies              Social History   Tobacco Use  Smoking Status Former Smoker  . Packs/day: 0.50  . Years: 40.00  . Pack years: 20.00  . Quit date: 87  . Years since quitting: 25.8  Smokeless Tobacco Never Used    Goals Met:  Proper associated with RPD/PD & O2 Sat Independence with exercise equipment Using PLB without cueing & demonstrates good technique Exercise tolerated well Personal goals reviewed No report of cardiac concerns or symptoms Strength training completed today  Goals Unmet:  Not Applicable  Comments: Pt able to follow exercise prescription today without complaint.  Will continue to monitor for progression.    Dr. Emily Filbert is Medical Director for [place] and [name].

## 2020-09-28 NOTE — Progress Notes (Signed)
Daily Session Note  Patient Details  Name: David Perez MRN: [id number] Date of Birth: [birth year] Referring Provider:     Pulmonary Rehab from 09/07/2020 in [facility] Cardiac and Pulmonary Rehab  Referring Provider Loralie Champagne MD      Encounter Date: 09/28/2020  Check In:  Session Check In - 09/28/20 1138      Check-In   Supervising physician immediately available to respond to emergencies See telemetry face sheet for immediately available ER MD    Location [facility]-Cardiac & Pulmonary Rehab    Staff Present Nada Maclachlan, BA, ACSM CEP, Exercise Physiologist;Akshitha Culmer Frederico Hamman, RN BSN;Joseph Hood RCP,RRT,BSRT    Virtual Visit No    Medication changes reported     No    Fall or balance concerns reported    No    Warm-up and Cool-down Performed on first and last piece of equipment    Resistance Training Performed Yes    VAD Patient? No    PAD/SET Patient? No      Pain Assessment   Currently in Pain? No/denies              Social History   Tobacco Use  Smoking Status Former Smoker   Packs/day: 0.50   Years: 40.00   Pack years: 20.00   Quit date: 1996   Years since quitting: 25.8  Smokeless Tobacco Never Used    Goals Met:  Proper associated with RPD/PD & O2 Sat Independence with exercise equipment Using PLB without cueing & demonstrates good technique Exercise tolerated well Strength training completed today  Goals Unmet:  Not Applicable  Comments: Pt able to follow exercise prescription today without complaint.  Will continue to monitor for progression.    Dr. Emily Filbert is Medical Director for [place] and [name].

## 2020-09-30 ENCOUNTER — Other Ambulatory Visit: Payer: Self-pay

## 2020-09-30 DIAGNOSIS — I5022 Chronic systolic (congestive) heart failure: Secondary | ICD-10-CM

## 2020-09-30 NOTE — Progress Notes (Signed)
Incomplete Session Note  Patient Details  Name: David Perez MRN: [id number] Date of Birth: 01/08/1941 Referring Provider:     Pulmonary Rehab from 09/07/2020 in [facility] Cardiac and Pulmonary Rehab  Referring Provider Loralie Champagne MD      [name] did not complete his rehab session.  [name] was upset that he was not allowed to do the elliptical today or a treadmill.  We explained that we have new people coming into the program and have to rotate equipment from time to time to allow new people to start and for those that have been here to advance.  He was saying that we have never let him try the elliptical in the back of gym at station 10.  When he was in cardiac, he attempted the elliptical at station 4 and was unable to keep the machine on and did not last.  We explained that the one in back is even harder and he still wanted to try.  He was upset that he was not allowed to go back there.  We tried to explain that someone was already on that piece of equipment and it could not be used until she left and it was cleaned as safety protocols for COVID-19 need people to stay 6 ft apart and equipment cleaned between patients.  He said we never explained or even gave him a chance to prove that he could or could not do it.  He resigned to wait until she left to try and to do his station.  Then he felt that he did not know what the station did or what the numbers meant on the machine.  These were explained as best as possible and he was willing to try to keep up the spm on the stepper.  He then said that he couldn't maintain the steps per minutes and his legs weren't strong enough and not where he wants them to be after already completing the cardiac program.  He has not been doing much exercise at home other than resistance training.  He was upset and felt that it was just best to leave at that point.  His vital were good to discharge home.

## 2020-10-03 ENCOUNTER — Other Ambulatory Visit: Payer: Self-pay

## 2020-10-03 ENCOUNTER — Encounter: Payer: Medicare Other | Attending: Cardiology | Admitting: *Deleted

## 2020-10-03 DIAGNOSIS — I5022 Chronic systolic (congestive) heart failure: Secondary | ICD-10-CM | POA: Insufficient documentation

## 2020-10-03 NOTE — Progress Notes (Signed)
Daily Session Note  Patient Details  Name: David Perez MRN: [id number] Date of Birth: March 19, 1941 Referring Provider:     Pulmonary Rehab from 09/07/2020 in [facility] Cardiac and Pulmonary Rehab  Referring Provider Loralie Champagne MD      Encounter Date: 10/03/2020  Check In:  Session Check In - 10/03/20 1101      Check-In   Supervising physician immediately available to respond to emergencies See telemetry face sheet for immediately available ER MD    Location [facility]-Cardiac & Pulmonary Rehab    Staff Present Renita Papa, RN BSN;Joseph [address] [place], [place], ACSM CEP, Exercise Physiologist;Kara Eliezer Bottom, MS Exercise Physiologist    Virtual Visit No    Medication changes reported     No    Fall or balance concerns reported    No    Warm-up and Cool-down Performed on first and last piece of equipment    Resistance Training Performed Yes    VAD Patient? No    PAD/SET Patient? No      Pain Assessment   Currently in Pain? No/denies              Social History   Tobacco Use  Smoking Status Former Smoker  . Packs/day: 0.50  . Years: 40.00  . Pack years: 20.00  . Quit date: 66  . Years since quitting: 25.8  Smokeless Tobacco Never Used    Goals Met:  Independence with exercise equipment Exercise tolerated well No report of cardiac concerns or symptoms Strength training completed today  Goals Unmet:  Not Applicable  Comments: Pt able to follow exercise prescription today without complaint.  Will continue to monitor for progression.    Dr. Emily Filbert is Medical Director for [place] and [name].

## 2020-10-05 ENCOUNTER — Encounter: Payer: Self-pay | Admitting: *Deleted

## 2020-10-05 DIAGNOSIS — I5022 Chronic systolic (congestive) heart failure: Secondary | ICD-10-CM

## 2020-10-05 NOTE — Progress Notes (Signed)
Pulmonary Individual Treatment Plan  Patient Details  Name: David Perez MRN: [id number] Date of Birth: 1941-04-25 Referring Provider:     Pulmonary Rehab from 09/07/2020 in [facility] Cardiac and Pulmonary Rehab  Referring Provider Loralie Champagne MD      Initial Encounter Date:    Pulmonary Rehab from 09/07/2020 in [facility] Cardiac and Pulmonary Rehab  Date 09/07/20      Visit Diagnosis: Heart failure, chronic systolic ([place])  Patient's Home Medications on Admission:  Current Outpatient Medications:  .  apixaban (ELIQUIS) 2.5 MG TABS tablet, Take 1 tablet (2.5 mg total) by mouth every 12 (twelve) hours., Disp: 180 tablet, Rfl: 3 .  atorvastatin (LIPITOR) 80 MG tablet, Take 1 tablet (80 mg total) by mouth daily at 6 PM., Disp: 90 tablet, Rfl: 3 .  carvedilol (COREG) 6.25 MG tablet, Take 1 tablet (6.25 mg total) by mouth 2 (two) times daily., Disp: 60 tablet, Rfl: 11 .  megestrol (MEGACE) 20 MG tablet, Take 1 tablet (20 mg total) by mouth daily., Disp: 30 tablet, Rfl: 4 .  sacubitril-valsartan (ENTRESTO) 49-51 MG, Take 1 tablet by mouth 2 (two) times daily., Disp: 180 tablet, Rfl: 1 .  spironolactone (ALDACTONE) 25 MG tablet, Take 1 tablet (25 mg total) by mouth at bedtime., Disp: 90 tablet, Rfl: 3  Past Medical History: Past Medical History:  Diagnosis Date  . 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 Hepatitis B     Tobacco Use: Social History   Tobacco Use  Smoking Status Former Smoker  . Packs/day: 0.50  . Years: 40.00  . Pack years: 20.00  . Quit date: 9  . Years since quitting: 25.8  Smokeless Tobacco Never Used    Labs: Recent Chemical engineer    Labs for ITP Cardiac and Pulmonary Rehab Latest Ref Rng & Units 01/06/2020 01/06/2020 01/07/2020 05/10/2020 08/15/2020   Cholestrol 0 - 200 mg/dL - - - 137 122   LDLCALC 0 - 99 mg/dL - - - 76 65   HDL >39.00 mg/dL - - - 48.60 47.90   Trlycerides 0 - 149 mg/dL - - - 58.0 47.0   Hemoglobin A1c 4.8 - 5.6 % - -  - - -   PHART 7.35 - 7.45 - - - - -   PCO2ART 32 - 48 mmHg - - - - -   HCO3 20.0 - 28.0 mmol/L - - - - -   TCO2 22 - 32 mmol/L - - - - -   ACIDBASEDEF 0.0 - 2.0 mmol/L - - - - -   O2SAT % 52.7 58.4 57.3 - -       Pulmonary Assessment Scores:   UCSD: Self-administered rating of dyspnea associated with activities of daily living (ADLs) 6-point scale (0 = "not at all" to 5 = "maximal or unable to do because of breathlessness")  Scoring Scores range from 0 to 120.  Minimally important difference is 5 units  CAT: CAT can identify the health impairment of COPD patients and is better correlated with disease progression.  CAT has a scoring range of zero to 40. The CAT score is classified into four groups of low (less than 10), medium (10 - 20), high (21-30) and very high (31-40) based on the impact level of disease on health status. A CAT score over 10 suggests significant symptoms.  A worsening CAT score could be explained by an exacerbation, poor medication adherence, poor inhaler technique, or progression of COPD or comorbid conditions.  CAT  MCID is 2 points  mMRC: mMRC (Modified Medical Research Council) Dyspnea Scale is used to assess the degree of baseline functional disability in patients of respiratory disease due to dyspnea. No minimal important difference is established. A decrease in score of 1 point or greater is considered a positive change.   Pulmonary Function Assessment:   Exercise Target Goals: Exercise Program Goal: Individual exercise prescription set using results from initial 6 min walk test and THRR while considering  patient's activity barriers and safety.   Exercise Prescription Goal: Initial exercise prescription builds to 30-45 minutes a day of aerobic activity, 2-3 days per week.  Home exercise guidelines will be given to patient during program as part of exercise prescription that the participant will acknowledge.  Education: Aerobic Exercise & Resistance  Training: - Gives group verbal and written instruction on the various components of exercise. Focuses on aerobic and resistive training programs and the benefits of this training and how to safely progress through these programs..   Pulmonary Rehab from 09/21/2020 in [facility] Cardiac and Pulmonary Rehab  Date 09/21/20  Educator [facility]  Instruction Review Code 1- Verbalizes Understanding  [employer] training]      Education: Exercise & Equipment Safety: - Individual verbal instruction and demonstration of equipment use and safety with use of the equipment.   Pulmonary Rehab from 09/21/2020 in [facility] Cardiac and Pulmonary Rehab  Date 09/07/20  Educator [facility]  Instruction Review Code 1- Verbalizes Understanding      Education: Exercise Physiology & General Exercise Guidelines: - Group verbal and written instruction with models to review the exercise physiology of the cardiovascular system and associated critical values. Provides general exercise guidelines with specific guidelines to those with heart or lung disease.    Pulmonary Rehab from 09/21/2020 in [facility] Cardiac and Pulmonary Rehab  Education need identified 09/07/20  Date 09/14/20  Educator [facility]  Instruction Review Code 1- [employer] Understanding      Education: Flexibility, Balance, Mind/Body Relaxation: Provides group verbal/written instruction on the benefits of flexibility and balance training, including mind/body exercise modes such as yoga, pilates and tai chi.  Demonstration and skill practice provided.   Pulmonary Rehab from 09/21/2020 in [facility] Cardiac and Pulmonary Rehab  Date 08/03/20  Educator [facility]  Instruction Review Code 1- Verbalizes Understanding      Activity Barriers & Risk Stratification:  Activity Barriers & Cardiac Risk Stratification - 09/07/20 1417      Activity Barriers & Cardiac Risk Stratification   Activity Barriers Deconditioning;Shortness of Breath;Decreased Ventricular Function;Muscular Weakness;Arthritis     Cardiac Risk Stratification High           6 Minute Walk:  6 Minute Walk    Row Name 09/07/20 1506         6 Minute Walk   Phase Initial     Distance 740 feet     Walk Time 6 minutes     # of Rest Breaks 0     MPH 1.4     METS 1.77     RPE 12     Perceived Dyspnea  1     VO2 Peak 6.2     Symptoms Yes (comment)     Comments SOB     Resting HR 72 bpm     Resting BP 128/66     Resting Oxygen Saturation  98 %     Exercise Oxygen Saturation  during 6 min walk 88 %     Max Ex. HR 98 bpm  Max Ex. BP 134/74     2 Minute Post BP 124/64       Interval HR   1 Minute HR 88     2 Minute HR 87     3 Minute HR 92     4 Minute HR 96     5 Minute HR 97     6 Minute HR 98     2 Minute Post HR 87     Interval Heart Rate? Yes       Interval Oxygen   Interval Oxygen? Yes     Baseline Oxygen Saturation % 98 %     1 Minute Oxygen Saturation % 97 %     1 Minute Liters of Oxygen 0 L  Room Air     2 Minute Oxygen Saturation % 94 %     2 Minute Liters of Oxygen 0 L     3 Minute Oxygen Saturation % 95 %     3 Minute Liters of Oxygen 0 L     4 Minute Oxygen Saturation % 88 %     4 Minute Liters of Oxygen 0 L     5 Minute Oxygen Saturation % 91 %     5 Minute Liters of Oxygen 0 L     6 Minute Oxygen Saturation % 93 %     6 Minute Liters of Oxygen 0 L     2 Minute Post Oxygen Saturation % 94 %     2 Minute Post Liters of Oxygen 0 L           Oxygen Initial Assessment:  Oxygen Initial Assessment - 09/07/20 1415      Home Oxygen   Home Oxygen Device None    Sleep Oxygen Prescription None    Home Exercise Oxygen Prescription None    Home Resting Oxygen Prescription None      Initial 6 min Walk   Oxygen Used None      Program Oxygen Prescription   Program Oxygen Prescription None      Intervention   Short Term Goals To learn and understand importance of monitoring SPO2 with pulse oximeter and demonstrate accurate use of the pulse oximeter.;To learn and understand  importance of maintaining oxygen saturations>88%;To learn and demonstrate proper pursed lip breathing techniques or other breathing techniques.    Long  Term Goals Verbalizes importance of monitoring SPO2 with pulse oximeter and return demonstration;Maintenance of O2 saturations>88%;Exhibits proper breathing techniques, such as pursed lip breathing or other method taught during program session           Oxygen Re-Evaluation:  Oxygen Re-Evaluation    Row Name 09/08/20 1734 09/28/20 1209           Program Oxygen Prescription   Program Oxygen Prescription None None        Home Oxygen   Home Oxygen Device None None      Sleep Oxygen Prescription None None      Home Exercise Oxygen Prescription None None      Home Resting Oxygen Prescription None None        Goals/Expected Outcomes   Short Term Goals To learn and understand importance of monitoring SPO2 with pulse oximeter and demonstrate accurate use of the pulse oximeter.;To learn and understand importance of maintaining oxygen saturations>88%;To learn and demonstrate proper pursed lip breathing techniques or other breathing techniques. To learn and understand importance of monitoring SPO2 with pulse oximeter and demonstrate accurate use of the pulse  oximeter.;To learn and understand importance of maintaining oxygen saturations>88%;To learn and demonstrate proper pursed lip breathing techniques or other breathing techniques.      Long  Term Goals Verbalizes importance of monitoring SPO2 with pulse oximeter and return demonstration;Maintenance of O2 saturations>88%;Exhibits proper breathing techniques, such as pursed lip breathing or other method taught during program session Verbalizes importance of monitoring SPO2 with pulse oximeter and return demonstration;Maintenance of O2 saturations>88%;Exhibits proper breathing techniques, such as pursed lip breathing or other method taught during program session      Comments Reviewed PLB technique  with pt.  Talked about how it works and it's importance in maintaining their exercise saturations. David Perez has been doing well with his breathing.  He is using his PLB and finds it helpful.  Saturations have been good in class.  He feels that exercise is helping to improve his breathing overall.      Goals/Expected Outcomes Short: Become more profiecient at using PLB.   Long: Become independent at using PLB. Short: continue to use PLB  Long; continue to improve breathing.             Oxygen Discharge (Final Oxygen Re-Evaluation):  Oxygen Re-Evaluation - 09/28/20 1209      Program Oxygen Prescription   Program Oxygen Prescription None      Home Oxygen   Home Oxygen Device None    Sleep Oxygen Prescription None    Home Exercise Oxygen Prescription None    Home Resting Oxygen Prescription None      Goals/Expected Outcomes   Short Term Goals To learn and understand importance of monitoring SPO2 with pulse oximeter and demonstrate accurate use of the pulse oximeter.;To learn and understand importance of maintaining oxygen saturations>88%;To learn and demonstrate proper pursed lip breathing techniques or other breathing techniques.    Long  Term Goals Verbalizes importance of monitoring SPO2 with pulse oximeter and return demonstration;Maintenance of O2 saturations>88%;Exhibits proper breathing techniques, such as pursed lip breathing or other method taught during program session    Comments David Perez has been doing well with his breathing.  He is using his PLB and finds it helpful.  Saturations have been good in class.  He feels that exercise is helping to improve his breathing overall.    Goals/Expected Outcomes Short: continue to use PLB  Long; continue to improve breathing.           Initial Exercise Prescription:  Initial Exercise Prescription - 09/07/20 1500      Date of Initial Exercise RX and Referring Provider   Date 09/07/20    Referring Provider Loralie Champagne MD      Treadmill   MPH 1     Grade 0.5    Minutes 15    METs 1.8      Recumbant Elliptical   Level 1    RPM 50    Minutes 15    METs 1.7      REL-XR   Level 3    Speed 50    Minutes 15    METs 1.7      Prescription Details   Frequency (times per week) 3    Duration Progress to 30 minutes of continuous aerobic without signs/symptoms of physical distress      Intensity   THRR 40-80% of Max Heartrate 99-126    Ratings of Perceived Exertion 11-13    Perceived Dyspnea 0-4      Progression   Progression Continue to progress workloads to maintain intensity without signs/symptoms of  physical distress.      Resistance Training   Training Prescription Yes    Weight 3 lb    Reps 10-15           Perform Capillary Blood Glucose checks as needed.  Exercise Prescription Changes:  Exercise Prescription Changes    Row Name 08/17/20 0900 08/30/20 0800 09/07/20 1500 09/14/20 0900 09/28/20 1700     Response to Exercise   Blood Pressure (Admit) 132/58 134/70 128/66 110/58 120/70   Blood Pressure (Exercise) 136/58 110/68 134/74 118/56 138/80   Blood Pressure (Exit) 120/60 126/70 124/64 92/60 102/68   Heart Rate (Admit) 82 bpm 81 bpm 72 bpm 90 bpm 74 bpm   Heart Rate (Exercise) 90 bpm 99 bpm 98 bpm 90 bpm 98 bpm   Heart Rate (Exit) 83 bpm 90 bpm 90 bpm 73 bpm 80 bpm   Oxygen Saturation (Admit) -- -- 98 % 97 % 96 %   Oxygen Saturation (Exercise) -- -- 88 % 97 % 96 %   Oxygen Saturation (Exit) -- -- 96 % 97 % 97 %   Rating of Perceived Exertion (Exercise) '13 15 12 12 14   ' Perceived Dyspnea (Exercise) -- -- '1 1 1   ' Symptoms none none SOB SOB --   Comments -- -- walk test results -- --   Duration Continue with 30 min of aerobic exercise without signs/symptoms of physical distress. Continue with 30 min of aerobic exercise without signs/symptoms of physical distress. -- Continue with 30 min of aerobic exercise without signs/symptoms of physical distress. Continue with 30 min of aerobic exercise without  signs/symptoms of physical distress.   Intensity THRR unchanged THRR unchanged -- THRR unchanged THRR unchanged     Progression   Progression Continue to progress workloads to maintain intensity without signs/symptoms of physical distress. Continue to progress workloads to maintain intensity without signs/symptoms of physical distress. -- Continue to progress workloads to maintain intensity without signs/symptoms of physical distress. Continue to progress workloads to maintain intensity without signs/symptoms of physical distress.   Average METs 1.43 1.51 -- 1.8 1.8     Resistance Training   Training Prescription -- -- -- Yes Yes   Weight 5 lb 5 lb -- 3 lb 3 lb   Reps 10-15 10-15 -- 10-15 10-15     Interval Training   Interval Training No No -- No No     Treadmill   MPH 1 1 -- 1 --   Grade 0.5 0.5 -- 0.5 --   Minutes 15 15 -- 15 --   METs 1.83 1.83 -- 1.8 --     NuStep   Level 3 3 -- -- 4   SPM -- 80 -- -- 80   Minutes 15 15 -- -- 15   METs 1.5 1.3 -- -- 1.5     Recumbant Elliptical   Level -- -- -- 1 --   Minutes -- -- -- 15 --   METs -- -- -- 1.8 --     REL-XR   Level 1 3 -- 1 --   Minutes 15 15 -- 15 --   METs 1 1 -- -- --     Biostep-RELP   Level -- -- -- -- 1   SPM -- -- -- -- 50   Minutes -- -- -- -- 15   METs -- -- -- -- 2     Home Exercise Plan   Plans to continue exercise at Home (comment)  walking Home (comment)  walking -- -- --  Frequency Add 2 additional days to program exercise sessions. Add 2 additional days to program exercise sessions. -- -- --   Initial Home Exercises Provided 07/18/20 07/18/20 -- -- --          Exercise Comments:  Exercise Comments    Row Name 09/30/20 1030           Exercise Comments Eliott Amparan did not complete his rehab session.  David Perez was upset that he was not allowed to do the elliptical today or a treadmill.  We explained that we have new people coming into the program and have to rotate equipment from time to  time to allow new people to start and for those that have been here to advance.  He was saying that we have never let him try the elliptical in the back of gym at station 10.  When he was in cardiac, he attempted the elliptical at station 4 and was unable to keep the machine on and did not last.  We explained that the one in back is even harder and he still wanted to try.  He was upset that he was not allowed to go back there.  We tried to explain that someone was already on that piece of equipment and it could not be used until she left and it was cleaned as safety protocols for COVID-19 need people to stay 6 ft apart and equipment cleaned between patients.  He said we never explained or even gave him a chance to prove that he could or could not do it.  He resigned to wait until she left to try and to do his station.  Then he felt that he did not know what the station did or what the numbers meant on the machine.  These were explained as best as possible and he was willing to try to keep up the spm on the stepper.  He then said that he couldn't maintain the steps per minutes and his legs weren't strong enough and not where he wants them to be after already completing the cardiac program.  He has not been doing much exercise at home other than resistance training.  He was upset and felt that it was just best to leave at that point.  His vital were good to discharge home.              Exercise Goals and Review:  Exercise Goals    Row Name 09/07/20 1509             Exercise Goals   Increase Physical Activity Yes       Intervention Provide advice, education, support and counseling about physical activity/exercise needs.;Develop an individualized exercise prescription for aerobic and resistive training based on initial evaluation findings, risk stratification, comorbidities and participant's personal goals.       Expected Outcomes Short Term: Attend rehab on a regular basis to increase amount of  physical activity.;Long Term: Add in home exercise to make exercise part of routine and to increase amount of physical activity.;Long Term: Exercising regularly at least 3-5 days a week.       Increase Strength and Stamina Yes       Intervention Provide advice, education, support and counseling about physical activity/exercise needs.;Develop an individualized exercise prescription for aerobic and resistive training based on initial evaluation findings, risk stratification, comorbidities and participant's personal goals.       Expected Outcomes Short Term: Increase workloads from initial exercise prescription for resistance, speed, and  METs.;Short Term: Perform resistance training exercises routinely during rehab and add in resistance training at home;Long Term: Improve cardiorespiratory fitness, muscular endurance and strength as measured by increased METs and functional capacity (6MWT)       Able to understand and use rate of perceived exertion (RPE) scale Yes       Intervention Provide education and explanation on how to use RPE scale       Expected Outcomes Short Term: Able to use RPE daily in rehab to express subjective intensity level;Long Term:  Able to use RPE to guide intensity level when exercising independently       Able to understand and use Dyspnea scale Yes       Intervention Provide education and explanation on how to use Dyspnea scale       Expected Outcomes Long Term: Able to use Dyspnea scale to guide intensity level when exercising independently;Short Term: Able to use Dyspnea scale daily in rehab to express subjective sense of shortness of breath during exertion       Knowledge and understanding of Target Heart Rate Range (THRR) Yes       Intervention Provide education and explanation of THRR including how the numbers were predicted and where they are located for reference       Expected Outcomes Short Term: Able to state/look up THRR;Short Term: Able to use daily as guideline for  intensity in rehab;Long Term: Able to use THRR to govern intensity when exercising independently       Able to check pulse independently Yes       Intervention Provide education and demonstration on how to check pulse in carotid and radial arteries.;Review the importance of being able to check your own pulse for safety during independent exercise       Expected Outcomes Short Term: Able to explain why pulse checking is important during independent exercise;Long Term: Able to check pulse independently and accurately       Understanding of Exercise Prescription Yes       Intervention Provide education, explanation, and written materials on patient's individual exercise prescription       Expected Outcomes Short Term: Able to explain program exercise prescription;Long Term: Able to explain home exercise prescription to exercise independently              Exercise Goals Re-Evaluation :  Exercise Goals Re-Evaluation    Row Name 08/15/20 0954 08/17/20 0953 08/30/20 0838 09/08/20 1733 09/14/20 0953     Exercise Goal Re-Evaluation   Exercise Goals Review Increase Physical Activity;Increase Strength and Stamina;Understanding of Exercise Prescription Increase Physical Activity;Increase Strength and Stamina;Understanding of Exercise Prescription Increase Physical Activity;Increase Strength and Stamina;Understanding of Exercise Prescription Increase Physical Activity;Able to understand and use rate of perceived exertion (RPE) scale;Knowledge and understanding of Target Heart Rate Range (THRR);Understanding of Exercise Prescription;Increase Strength and Stamina;Able to understand and use Dyspnea scale;Able to check pulse independently Increase Physical Activity;Increase Strength and Stamina;Understanding of Exercise Prescription   Comments David Perez felt ok doing 1 mph and .5% grade today.  He is on level 3 on NS.  He is still doing some strength training at home. David Perez has been doing well in rehab. He has now had a  chance to try all the different machines we have.  He found the only one he couldn't do was the ellipitcal.  He is nearing graduation and should improve on his walk test.  We will continue to montior his progress. David Perez is nearing graduation. He has increased  to level 3 on the XR. He has been preferring the treadmill for primary mode of exercise and tolerates it well. Will continue to monitor. Reviewed RPE and dyspnea scales, THR and program prescription with pt today.  Pt voiced understanding and was given a copy of goals to take home. David Perez is off to a good start in Pulmonary Rehab.  He is already up to 1.8 METs on the XR.  We will continue to monitor his progress.   Expected Outcomes Short: continue to progress workloads Long: build overall stamina Short: improve post 6MWT Long: Continue to improve stamina. Short: Graduate Long: Progress MET level and overall strength/endurance Short: Use RPE daily to regulate intensity. Long: Follow program prescription in THR. Short: Continue to attend rehab regularly  Long: Conitnue to follow program prescription   Row Name 09/28/20 1157             Exercise Goal Re-Evaluation   Exercise Goals Review Increase Physical Activity;Increase Strength and Stamina;Understanding of Exercise Prescription       Comments David Perez is doing well in rehab.  He is not doing much cardio on his off days, but he is doing his weights.  We talked about the importance of cardio, but he feels he is getting that in class.  David Perez has expressed interest in using the ellipitcal, and we have told him that he needs to build up his speed on the XR and treadmill prior to trying the elliptical.       Expected Outcomes Short: Continue to try to add in cardio at home Long; conitnue to improve stamina.              Discharge Exercise Prescription (Final Exercise Prescription Changes):  Exercise Prescription Changes - 09/28/20 1700      Response to Exercise   Blood Pressure (Admit) 120/70    Blood  Pressure (Exercise) 138/80    Blood Pressure (Exit) 102/68    Heart Rate (Admit) 74 bpm    Heart Rate (Exercise) 98 bpm    Heart Rate (Exit) 80 bpm    Oxygen Saturation (Admit) 96 %    Oxygen Saturation (Exercise) 96 %    Oxygen Saturation (Exit) 97 %    Rating of Perceived Exertion (Exercise) 14    Perceived Dyspnea (Exercise) 1    Duration Continue with 30 min of aerobic exercise without signs/symptoms of physical distress.    Intensity THRR unchanged      Progression   Progression Continue to progress workloads to maintain intensity without signs/symptoms of physical distress.    Average METs 1.8      Resistance Training   Training Prescription Yes    Weight 3 lb    Reps 10-15      Interval Training   Interval Training No      NuStep   Level 4    SPM 80    Minutes 15    METs 1.5      Biostep-RELP   Level 1    SPM 50    Minutes 15    METs 2           Nutrition:  Target Goals: Understanding of nutrition guidelines, daily intake of sodium <1536m, cholesterol <2078m calories 30% from fat and 7% or less from saturated fats, daily to have 5 or more servings of fruits and vegetables.  Education: Controlling Sodium/Reading Food Labels -Group verbal and written material supporting the discussion of sodium use in heart healthy nutrition. Review and explanation with  models, verbal and written materials for utilization of the food label.   Education: General Nutrition Guidelines/Fats and Fiber: -Group instruction provided by verbal, written material, models and posters to present the general guidelines for heart healthy nutrition. Gives an explanation and review of dietary fats and fiber.   Pulmonary Rehab from 09/21/2020 in [facility] Cardiac and Pulmonary Rehab  Education need identified 09/07/20      Biometrics:  Pre Biometrics - 09/07/20 1539      Pre Biometrics   Height 5' 5.5" (1.664 m)    Weight 115 lb 12.8 oz (52.5 kg)    BMI (Calculated) 18.97    Single Leg  Stand 1.12 seconds            Nutrition Therapy Plan and Nutrition Goals:  Nutrition Therapy & Goals - 09/26/20 0947      Nutrition Therapy   Diet Heart healthy, low sodium    Protein (specify units) 50g    Fiber 30 grams    Whole Grain Foods 3 servings    Saturated Fats 12 max. grams    Fruits and Vegetables 5 servings/day    Sodium 1.5 grams      Personal Nutrition Goals   Nutrition Goal ST: trying out frozen vegetables that are easier to chew in addition to his meals he gets out. LT: maintain weight, include at least 5 fruits or vegetables a day, reduce sodium to 2g/day.    Comments B: soft boiled eggs (2) with whole wheat toast or special K protein cereal with skim milk - will have coffee (sweet and low) or juice. Puts protein powder in skim milk or juice. He doesn't drink whole milk because it makes him congested. He eats soft foods because he has a hard time chewing. S: fruit (blueberries or banana) L: chicken wings and macaroni salad. D:hamburger from [employer], [place] food vegetable egg rolls, sometimes fish. doesn't cook at home much or prepare things at home.  He doesn't cook and his wife has passed. Reviewed heart healthy eating and easy low prep meals such as rotisserie chicken (with vegetables and grains/potato, with pasta, in soup with low sodium broth and frozen veggies, chicken salad), pasta or bean salad using canned or pre-cut vegetables, or pasta with frozen broccoli, jarred sauce, and beans or chicken. He reports not wanting to get "rolls of fat".      Intervention Plan   Intervention Prescribe, educate and counsel regarding individualized specific dietary modifications aiming towards targeted core components such as weight, hypertension, lipid management, diabetes, heart failure and other comorbidities.;Nutrition handout(s) given to patient.    Expected Outcomes Short Term Goal: Understand basic principles of dietary content, such as calories, fat, sodium, cholesterol and  nutrients.;Short Term Goal: A plan has been developed with personal nutrition goals set during dietitian appointment.;Long Term Goal: Adherence to prescribed nutrition plan.           Nutrition Assessments:  Nutrition Assessments - 09/07/20 1544      MEDFICTS Scores   Pre Score 47           MEDIFICTS Score Key:          ?70 Need to make dietary changes          40-70 Heart Healthy Diet         ? 40 Therapeutic Level Cholesterol Diet  Nutrition Goals Re-Evaluation:  Nutrition Goals Re-Evaluation    Dixon Name 08/17/20 0919             Goals  Nutrition Goal ST: Continue to include more home prepped meals to reduce sodium LT: gain muscle, maintain weight       Comment Pt is getting ready to graduate and is at 28 visits. 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 Pt has been preparing some simple meals at home, but admits its sporatic. Pt reports his weight has been stable and reports added chicken has been helping with his strength. He will continue to add in more meals he prepared himself, but doesn't seem confident in doing so - encouraged pt that he has made progress and to take his goals slow after leaving [name] at rehab.       Expected Outcome Continue to include more home prepped meals to reduce sodium - stay motivated after rehab              Nutrition Goals Discharge (Final Nutrition Goals Re-Evaluation):  Nutrition Goals Re-Evaluation - 08/17/20 0919      Goals   Nutrition Goal ST: Continue to include more home prepped meals to reduce sodium LT: gain muscle, maintain weight    Comment Pt is getting ready to graduate and is at 28 visits. 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 Pt has been preparing some simple meals at home, but admits its sporatic. Pt reports his weight has been stable and reports added chicken has been helping with his strength. He will continue to add in more meals he prepared himself, but doesn't seem confident in doing  so - encouraged pt that he has made progress and to take his goals slow after leaving [name] at rehab.    Expected Outcome Continue to include more home prepped meals to reduce sodium - stay motivated after rehab           Psychosocial: Target Goals: Acknowledge presence or absence of significant depression and/or stress, maximize coping skills, provide positive support system. Participant is able to verbalize types and ability to use techniques and skills needed for reducing stress and depression.   Education: Depression - Provides group verbal and written instruction on the correlation between heart/lung disease and depressed mood, treatment options, and the stigmas associated with seeking treatment.   Pulmonary Rehab from 09/21/2020 in [facility] Cardiac and Pulmonary Rehab  Date 07/06/20  Educator SB  Instruction Review Code 1- [employer] Understanding      Education: Sleep Hygiene -Provides group verbal and written instruction about how sleep can affect your health.  Define sleep hygiene, discuss sleep cycles and impact of sleep habits. Review good sleep hygiene tips.    Education: Stress and Anxiety: - Provides group verbal and written instruction about the health risks of elevated stress and causes of high stress.  Discuss the correlation between heart/lung disease and anxiety and treatment options. Review healthy ways to manage with stress and anxiety.   Initial Review & Psychosocial Screening:  Initial Psych Review & Screening - 09/07/20 1415      Initial Review   Current issues with None Identified    Source of Stress Concerns None Identified      Family Dynamics   Good Support System? Yes   daughters, [name] lives the closest     Barriers   Psychosocial barriers to participate in program The patient should benefit from training in stress management and relaxation.;There are no identifiable barriers or psychosocial needs.      Screening Interventions   Interventions Provide  feedback about the scores to participant;Encouraged to  exercise;To provide support and resources with identified psychosocial needs    Expected Outcomes Short Term goal: Utilizing psychosocial counselor, staff and physician to assist with identification of specific Stressors or current issues interfering with healing process. Setting desired goal for each stressor or current issue identified.;Long Term Goal: Stressors or current issues are controlled or eliminated.;Short Term goal: Identification and review with participant of any Quality of Life or Depression concerns found by scoring the questionnaire.;Long Term goal: The participant improves quality of Life and PHQ9 Scores as seen by post scores and/or verbalization of changes           Quality of Life Scores:  Scores of 19 and below usually indicate a poorer quality of life in these areas.  A difference of  2-3 points is a clinically meaningful difference.  A difference of 2-3 points in the total score of the Quality of Life Index has been associated with significant improvement in overall quality of life, self-image, physical symptoms, and general health in studies assessing change in quality of life.  PHQ-9: Recent Review Flowsheet Data    Depression screen [facility] 2/9 09/07/2020 08/15/2020 06/29/2020 06/20/2020 05/10/2020   Decreased Interest 0 0 0 0 0   Down, Depressed, Hopeless 0 0 0 0 0   PHQ - 2 Score 0 0 0 0 0   Altered sleeping 0 - 0 0 0   Tired, decreased energy 0 - 0 0 0   Change in appetite 0 - 0 0 0   Feeling bad or failure about yourself  0 - 0 0 0   Trouble concentrating 0 - 0 0 0   Moving slowly or fidgety/restless 0 - 0 0 0   Suicidal thoughts 0 - 0 0 0   PHQ-9 Score 0 - 0 0 0   Difficult doing work/chores Not difficult at all - - - Not difficult at all     Interpretation of Total Score  Total Score Depression Severity:  1-4 = Minimal depression, 5-9 = Mild depression, 10-14 = Moderate depression, 15-19 = Moderately severe  depression, 20-27 = Severe depression   Psychosocial Evaluation and Intervention:  Psychosocial Evaluation - 09/07/20 1539      Psychosocial Evaluation & Interventions   Interventions Encouraged to exercise with the program and follow exercise prescription    Comments David Perez is now returning to Pulmonary Rehab after finishing cardiac rehab.  He is coming in with heart failure and SOB.  He noticed how much cardiac rehab helped him and wanted to continue to exercise.  He has had a recent medication change and wants to combine it with his rehab to continue to feel better.  He feels that he is back on track to get back to where he wants to be.  He has also started to take the stairs after rehab to help rebuild his stamina.    Expected Outcomes Short: Attend pulmonary rehab and education classes  Long: Continue to focus on positives.           Psychosocial Re-Evaluation:  Psychosocial Re-Evaluation    Row Name 08/15/20 0956 09/28/20 1159           Psychosocial Re-Evaluation   Current issues with Current Stress Concerns None Identified      Comments No stress or sleep concerns. David Perez is doing well in rehab.  He is sleeping the best he has in years!  He denies any major stressors.      Expected Outcomes Short: continue to exercise Long:  stay positive Short: Continue to exercise and sleep well Long: Continue to stay positive      Interventions -- Encouraged to attend Pulmonary Rehabilitation for the exercise      Continue Psychosocial Services  -- Follow up required by staff             Psychosocial Discharge (Final Psychosocial Re-Evaluation):  Psychosocial Re-Evaluation - 09/28/20 1159      Psychosocial Re-Evaluation   Current issues with None Identified    Comments David Perez is doing well in rehab.  He is sleeping the best he has in years!  He denies any major stressors.    Expected Outcomes Short: Continue to exercise and sleep well Long: Continue to stay positive    Interventions Encouraged to  attend Pulmonary Rehabilitation for the exercise    Continue Psychosocial Services  Follow up required by staff           Education: Education Goals: Education classes will be provided on a weekly basis, covering required topics. Participant will state understanding/return demonstration of topics presented.  Learning Barriers/Preferences:  Learning Barriers/Preferences - 09/07/20 1416      Learning Barriers/Preferences   Learning Barriers None    Learning Preferences None           General Pulmonary Education Topics:  Infection Prevention: - Provides verbal and written material to individual with discussion of infection control including proper hand washing and proper equipment cleaning during exercise session.   Pulmonary Rehab from 09/21/2020 in [facility] Cardiac and Pulmonary Rehab  Date 09/07/20  Educator [facility]  Instruction Review Code 1- Verbalizes Understanding      Falls Prevention: - Provides verbal and written material to individual with discussion of falls prevention and safety.   Pulmonary Rehab from 09/21/2020 in [facility] Cardiac and Pulmonary Rehab  Date 09/07/20  Educator [facility]  Instruction Review Code 1- Verbalizes Understanding      Chronic Lung Diseases: - Group verbal and written instruction to review updates, respiratory medications, advancements in procedures and treatments. Discuss use of supplemental oxygen including available portable oxygen systems, continuous and intermittent flow rates, concentrators, personal use and safety guidelines. Review proper use of inhaler and spacers. Provide informative websites for self-education.    Pulmonary Rehab from 09/21/2020 in [facility] Cardiac and Pulmonary Rehab  Date 06/22/20  Educator [facility]  Instruction Review Code 1- Verbalizes Understanding      Energy Conservation: - Provide group verbal and written instruction for methods to conserve energy, plan and organize activities. Instruct on pacing techniques, use of adaptive  equipment and posture/positioning to relieve shortness of breath.   Pulmonary Rehab from 09/21/2020 in [facility] Cardiac and Pulmonary Rehab  Date 08/31/20  Educator [facility]  Instruction Review Code 1- Verbalizes Understanding      Triggers and Exacerbations: - Group verbal and written instruction to review types of environmental triggers and ways to prevent exacerbations. Discuss weather changes, air quality and the benefits of nasal washing. Review warning signs and symptoms to help prevent infections. Discuss techniques for effective airway clearance, coughing, and vibrations.   Pulmonary Rehab from 09/21/2020 in [facility] Cardiac and Pulmonary Rehab  Date 08/31/20  Educator [facility]  Instruction Review Code 1- Verbalizes Understanding      AED/CPR: - Group verbal and written instruction with the use of models to demonstrate the basic use of the AED with the basic ABC's of resuscitation.   Anatomy and Physiology of the Lungs: - Group verbal and written instruction with the use of models to provide  basic lung anatomy and physiology related to function, structure and complications of lung disease.   Pulmonary Rehab from 09/21/2020 in [facility] Cardiac and Pulmonary Rehab  Date 08/31/20  Educator [facility]  Instruction Review Code 1- Verbalizes Understanding      Anatomy & Physiology of the Heart: - Group verbal and written instruction and models provide basic cardiac anatomy and physiology, with the coronary electrical and arterial systems. Review of Valvular disease and Heart Failure   Cardiac Medications: - Group verbal and written instruction to review commonly prescribed medications for heart disease. Reviews the medication, class of the drug, and side effects.   Pulmonary Rehab from 09/21/2020 in [facility] Cardiac and Pulmonary Rehab  Date 08/17/20  Educator SB  Instruction Review Code 1- Verbalizes Understanding      Other: -Provides group and verbal instruction on various topics (see  comments)   Knowledge Questionnaire Score:  Knowledge Questionnaire Score - 09/07/20 1548      Knowledge Questionnaire Score   Pre Score 12/18 Education Focus: Lung Disease, O2 safety, Nutrition            Core Components/Risk Factors/Patient Goals at Admission:  Personal Goals and Risk Factors at Admission - 09/07/20 1549      Core Components/Risk Factors/Patient Goals on Admission    Weight Management Yes;Weight Maintenance    Intervention Weight Management: Develop a combined nutrition and exercise program designed to reach desired caloric intake, while maintaining appropriate intake of nutrient and fiber, sodium and fats, and appropriate energy expenditure required for the weight goal.;Weight Management: Provide education and appropriate resources to help participant work on and attain dietary goals.    Admit Weight 115 lb 12.8 oz (52.5 kg)    Goal Weight: Short Term 115 lb (52.2 kg)    Goal Weight: Long Term 115 lb (52.2 kg)    Expected Outcomes Short Term: Continue to assess and modify interventions until short term weight is achieved;Long Term: Adherence to nutrition and physical activity/exercise program aimed toward attainment of established weight goal;Weight Maintenance: Understanding of the daily nutrition guidelines, which includes 25-35% calories from fat, 7% or less cal from saturated fats, less than 225m cholesterol, less than 1.5gm of sodium, & 5 or more servings of fruits and vegetables daily    Improve shortness of breath with ADL's Yes    Intervention Provide education, individualized exercise plan and daily activity instruction to help decrease symptoms of SOB with activities of daily living.    Expected Outcomes Short Term: Improve cardiorespiratory fitness to achieve a reduction of symptoms when performing ADLs;Long Term: Be able to perform more ADLs without symptoms or delay the onset of symptoms    Heart Failure Yes    Intervention Provide a combined exercise and  nutrition program that is supplemented with education, support and counseling about heart failure. Directed toward relieving symptoms such as shortness of breath, decreased exercise tolerance, and extremity edema.    Expected Outcomes Improve functional capacity of life;Short term: Daily weights obtained and reported for increase. Utilizing diuretic protocols set by physician.;Short term: Attendance in program 2-3 days a week with increased exercise capacity. Reported lower sodium intake. Reported increased fruit and vegetable intake. Reports medication compliance.;Long term: Adoption of self-care skills and reduction of barriers for early signs and symptoms recognition and intervention leading to self-care maintenance.    Hypertension Yes    Intervention Monitor prescription use compliance.;Provide education on lifestyle modifcations including regular physical activity/exercise, weight management, moderate sodium restriction and increased consumption of fresh fruit,  vegetables, and low fat dairy, alcohol moderation, and smoking cessation.    Expected Outcomes Short Term: Continued assessment and intervention until BP is < 140/59m HG in hypertensive participants. < 130/833mHG in hypertensive participants with diabetes, heart failure or chronic kidney disease.;Long Term: Maintenance of blood pressure at goal levels.    Lipids Yes    Intervention Provide education and support for participant on nutrition & aerobic/resistive exercise along with prescribed medications to achieve LDL <7031mHDL >14m16m  Expected Outcomes Short Term: Participant states understanding of desired cholesterol values and is compliant with medications prescribed. Participant is following exercise prescription and nutrition guidelines.;Long Term: Cholesterol controlled with medications as prescribed, with individualized exercise RX and with personalized nutrition plan. Value goals: LDL < 70mg20mL > 40 mg.            Education:Diabetes - Individual verbal and written instruction to review signs/symptoms of diabetes, desired ranges of glucose level fasting, after meals and with exercise. Acknowledge that pre and post exercise glucose checks will be done for 3 sessions at entry of program.   Education: Know Your Numbers and Risk Factors: -Group verbal and written instruction about important numbers in your health.  Discussion of what are risk factors and how they play a role in the disease process.  Review of Cholesterol, Blood Pressure, Diabetes, and BMI and the role they play in your overall health.   [facility]  Date 07/13/20  Educator [facility] Review Code 1- Verbalizes Understanding      Core Components/Risk Factors/Patient Goals Review:   Goals and Risk Factor Review    Row Name 08/15/20 0951 09/28/20 1206           Core Components/Risk Factors/Patient Goals Review   Personal Goals Review Weight Management/Obesity;Hypertension;Heart Failure;Lipids Weight Management/Obesity;Hypertension;Heart Failure;Lipids      Review David Perez is taking meds as directed.  He does feel a little tired today.  He has not had any HF symptoms.  He can do day to day activities without fatigue.  He sees his Dr today. David Perez is doing well in rehab.  His weight is holding pretty steady.  He denies any heart failure symptoms.  His pressures have been doing fairly well in class, but he is not checking them at home.  We talked about the importance of keeping an eye on it at home.      Expected Outcomes Short:  conitnue current exercise and medication regimen Long: keep risk factors under control Short: Start checking blood pressures at home  Long: Continue to monitor risk factors.             Core Components/Risk Factors/Patient Goals at Discharge (Final Review):   Goals and Risk Factor Review - 09/28/20 1206      Core Components/Risk Factors/Patient Goals Review    Personal Goals Review Weight Management/Obesity;Hypertension;Heart Failure;Lipids    Review David Perez is doing well in rehab.  His weight is holding pretty steady.  He denies any heart failure symptoms.  His pressures have been doing fairly well in class, but he is not checking them at home.  We talked about the importance of keeping an eye on it at home.    Expected Outcomes Short: Start checking blood pressures at home  Long: Continue to monitor risk factors.           ITP Comments:  ITP Comments    Row Name 08/10/20 1601 08/31/20 1554 09/07/20 1503  09/08/20 1731 09/30/20 1030   ITP Comments 30 day review completed. ITP sent to Dr. Emily Filbert, Medical Director of Cardiac and Pulmonary Rehab. Continue with ITP unless changes are made by physician. David Perez has completed all 36 sessions today. He is discharged from the program. Completed 6MWT and gym orientation. Initial ITP created and sent for review to Dr. Emily Filbert, Medical Director. First full day of exercise!  Patient was oriented to gym and equipment including functions, settings, policies, and procedures.  Patient's individual exercise prescription and treatment plan were reviewed.  All starting workloads were established based on the results of the 6 minute walk test done at initial orientation visit.  The plan for exercise progression was also introduced and progression will be customized based on patient's performance and goals. [name] did not complete his rehab session.  David Perez was upset that he was not allowed to do the elliptical today or a treadmill.  We explained that we have new people coming into the program and have to rotate equipment from time to time to allow new people to start and for those that have been here to advance.  He was saying that we have never let him try the elliptical in the back of gym at station 10.  When he was in cardiac, he attempted the elliptical at station 4 and was unable to keep the machine on and did not  last.  We explained that the one in back is even harder and he still wanted to try.  He was upset that he was not allowed to go back there.  We tried to explain that someone was already on that piece of equipment and it could not be used until she left and it was cleaned as safety protocols for COVID-19 need people to stay 6 ft apart and equipment cleaned between patients.  He said we never explained or even gave him a chance to prove that he could or could not do it.  He resigned to wait until she left to try and to do his station.  Then he felt that he did not know what the station did or what the numbers meant on the machine.  These were explained as best as possible and he was willing to try to keep up the spm on the stepper.  He then said that he couldn't maintain the steps per minutes and his legs weren't strong enough and not where he wants them to be after already completing the cardiac program.  He has not been doing much exercise at home other than resistance training.  He was upset and felt that it was just best to leave at that point.  His vital were good to discharge home.   Izard Name 10/05/20 [phone] 10/05/20 0648         ITP Comments David Perez did return to the program and was able to exercise without any voiced concerns 30 Day review completed. Medical Director ITP review done, changes made as directed, and signed approval by Medical Director.             Comments:

## 2020-10-07 ENCOUNTER — Encounter: Payer: Medicare Other | Admitting: *Deleted

## 2020-10-07 ENCOUNTER — Other Ambulatory Visit: Payer: Self-pay

## 2020-10-07 DIAGNOSIS — I5022 Chronic systolic (congestive) heart failure: Secondary | ICD-10-CM | POA: Diagnosis not present

## 2020-10-07 NOTE — Progress Notes (Signed)
Daily Session Note  Patient Details  Name: David Perez MRN: [id number] Date of Birth: Oct 08, 1941 Referring Provider:     Pulmonary Rehab from 09/07/2020 in [facility]  Referring Provider Loralie Champagne MD      Encounter Date: 10/07/2020  Check In:  Session Check In - 10/07/20 1010      Check-In   Supervising physician immediately available to respond to emergencies See telemetry face sheet for immediately available ER MD    Location [facility]-Card[facility] & Pulmonary Re[facility]    Staff Present Heath Lark, RN, BSN, CCRP;Meredith Sherryll Burger, RN BSN;Jessica [place], MA, RCEP, CCRP, CCET;Joseph Elfers RCP,RRT,BSRT    Virtual Visit No    Medication changes reported     No    Fall or balance concerns reported    No    Warm-up and Cool-down Performed on first and last piece of equipment    Resistance Training Performed Yes    VAD Patient? No    PAD/SET Patient? No      Pain Assessment   Currently in Pain? No/denies              Social History   Tobacco Use  Smoking Status Former Smoker  . Packs/day: 0.50  . Years: 40.00  . Pack years: 20.00  . Quit date: 76  . Years since quitting: 25.8  Smokeless Tobacco Never Used    Goals Met:  Proper associated with RPD/PD & O2 Sat Independence with exercise equipment Exercise tolerated well No report of cardiac concerns or symptoms  Goals Unmet:  Not Applicable  Comments: Pt able to follow exercise prescription today without complaint.  Will continue to monitor for progression.    Dr. Emily Filbert is Medical Director for [name].

## 2020-10-12 ENCOUNTER — Other Ambulatory Visit: Payer: Self-pay

## 2020-10-12 DIAGNOSIS — I5022 Chronic systolic (congestive) heart failure: Secondary | ICD-10-CM

## 2020-10-12 NOTE — Progress Notes (Signed)
Daily Session Note  Patient Details  Name: Sohum Delillo MRN: [id number] Date of Birth: 07-Jun-1941 Referring Provider:     Pulmonary Rehab from 09/07/2020 in [facility]  Referring Provider Loralie Champagne MD      Encounter Date: 10/12/2020  Check In:  Session Check In - 10/12/20 1003      Check-In   Supervising physician immediately available to respond to emergencies See telemetry face sheet for immediately available ER MD    Location [facility]    Staff Present Birdie Sons, MPA, RN;Amanda Sommer, BA, ACSM CEP, Exercise Physiologist;Jessica Luan Pulling, MA, RCEP, CCRP, CCET    Virtual Visit No    Medication changes reported     No    Fall or balance concerns reported    No    Warm-up and Cool-down Performed on first and last piece of equipment    Resistance Training Performed Yes    VAD Patient? No    PAD/SET Patient? No      Pain Assessment   Currently in Pain? No/denies              Social History   Tobacco Use  Smoking Status Former Smoker  . Packs/day: 0.50  . Years: 40.00  . Pack years: 20.00  . Quit date: 75  . Years since quitting: 25.8  Smokeless Tobacco Never Used    Goals Met:  Independence with exercise equipment Exercise tolerated well No report of cardiac concerns or symptoms Strength training completed today  Goals Unmet:  Not Applicable  Comments: Pt able to follow exercise prescription today without complaint.  Will continue to monitor for progression.    Dr. Emily Filbert is Medical Director for [place] [name].

## 2020-10-19 ENCOUNTER — Telehealth: Payer: Self-pay

## 2020-10-19 NOTE — Telephone Encounter (Signed)
David Perez has called out of rehab his last few visits as he was not feeling well. Today he called out due to "a medical situation" and did not elaborate. Asked if everything was ok and tried to get more information, David Perez reported trying to find that out and that he hopes he is ok.

## 2020-10-21 ENCOUNTER — Telehealth: Payer: Self-pay | Admitting: *Deleted

## 2020-10-21 NOTE — Telephone Encounter (Signed)
CAlled to check on Al ,as he has been absent.  He answered and said he is very weak. Will call again next week to see if he is feeling better.

## 2020-11-02 ENCOUNTER — Other Ambulatory Visit: Payer: Self-pay

## 2020-11-02 ENCOUNTER — Encounter: Payer: Self-pay | Admitting: *Deleted

## 2020-11-02 ENCOUNTER — Encounter: Payer: Medicare Other | Attending: Cardiology

## 2020-11-02 ENCOUNTER — Inpatient Hospital Stay: Payer: Medicare Other | Attending: Oncology

## 2020-11-02 DIAGNOSIS — I5022 Chronic systolic (congestive) heart failure: Secondary | ICD-10-CM

## 2020-11-02 DIAGNOSIS — I951 Orthostatic hypotension: Secondary | ICD-10-CM | POA: Diagnosis not present

## 2020-11-02 DIAGNOSIS — D539 Nutritional anemia, unspecified: Secondary | ICD-10-CM

## 2020-11-02 DIAGNOSIS — I9589 Other hypotension: Secondary | ICD-10-CM | POA: Diagnosis not present

## 2020-11-02 LAB — CBC WITH DIFFERENTIAL/PLATELET
Abs Immature Granulocytes: 0.03 10*3/uL (ref 0.00–0.07)
Basophils Absolute: 0 10*3/uL (ref 0.0–0.1)
Basophils Relative: 0 %
Eosinophils Absolute: 0.2 10*3/uL (ref 0.0–0.5)
Eosinophils Relative: 3 %
HCT: 24.8 % — ABNORMAL LOW (ref 39.0–52.0)
Hemoglobin: 8.1 g/dL — ABNORMAL LOW (ref 13.0–17.0)
Immature Granulocytes: 0 %
Lymphocytes Relative: 9 %
Lymphs Abs: 0.7 10*3/uL (ref 0.7–4.0)
MCH: 35.8 pg — ABNORMAL HIGH (ref 26.0–34.0)
MCHC: 32.7 g/dL (ref 30.0–36.0)
MCV: 109.7 fL — ABNORMAL HIGH (ref 80.0–100.0)
Monocytes Absolute: 0.4 10*3/uL (ref 0.1–1.0)
Monocytes Relative: 4 %
Neutro Abs: 7 10*3/uL (ref 1.7–7.7)
Neutrophils Relative %: 84 %
Platelets: 294 10*3/uL (ref 150–400)
RBC: 2.26 MIL/uL — ABNORMAL LOW (ref 4.22–5.81)
RDW: 18.9 % — ABNORMAL HIGH (ref 11.5–15.5)
WBC: 8.4 10*3/uL (ref 4.0–10.5)
nRBC: 0 % (ref 0.0–0.2)

## 2020-11-02 LAB — BASIC METABOLIC PANEL
Anion gap: 9 (ref 5–15)
BUN: 32 mg/dL — ABNORMAL HIGH (ref 8–23)
CO2: 26 mmol/L (ref 22–32)
Calcium: 9.2 mg/dL (ref 8.9–10.3)
Chloride: 105 mmol/L (ref 98–111)
Creatinine, Ser: 1.09 mg/dL (ref 0.61–1.24)
GFR, Estimated: >60 mL/min (ref >60)
Glucose, Bld: 171 mg/dL — ABNORMAL HIGH (ref 70–99)
Potassium: 4.3 mmol/L (ref 3.5–5.1)
Sodium: 140 mmol/L (ref 135–145)

## 2020-11-02 LAB — HEPATIC FUNCTION PANEL
ALT: 103 U/L — ABNORMAL HIGH (ref 0–44)
AST: 67 U/L — ABNORMAL HIGH (ref 15–41)
Albumin: 2.4 g/dL — ABNORMAL LOW (ref 3.5–5.0)
Alkaline Phosphatase: 75 U/L (ref 38–126)
Bilirubin, Direct: 0.2 mg/dL (ref 0.0–0.2)
Indirect Bilirubin: 0.7 mg/dL (ref 0.3–0.9)
Total Bilirubin: 0.9 mg/dL (ref 0.3–1.2)
Total Protein: 6.9 g/dL (ref 6.5–8.1)

## 2020-11-02 LAB — LACTATE DEHYDROGENASE: LDH: 188 U/L (ref 98–192)

## 2020-11-02 LAB — PATHOLOGIST SMEAR REVIEW

## 2020-11-02 NOTE — Progress Notes (Signed)
Pulmonary Individual Treatment Plan  Patient Details  Name: David Perez MRN: [id number] Date of Birth: 1941/03/13 Referring Provider:     Pulmonary Rehab from 09/07/2020 in [facility] Cardiac and Pulmonary Rehab  Referring Provider Loralie Champagne MD      Initial Encounter Date:    Pulmonary Rehab from 09/07/2020 in [facility] Cardiac and Pulmonary Rehab  Date 09/07/20      Visit Diagnosis: Heart failure, chronic systolic ([place])  Patient's Home Medications on Admission:  Current Outpatient Medications:  .  apixaban (ELIQUIS) 2.5 MG TABS tablet, Take 1 tablet (2.5 mg total) by mouth every 12 (twelve) hours., Disp: 180 tablet, Rfl: 3 .  atorvastatin (LIPITOR) 80 MG tablet, Take 1 tablet (80 mg total) by mouth daily at 6 PM., Disp: 90 tablet, Rfl: 3 .  carvedilol (COREG) 6.25 MG tablet, Take 1 tablet (6.25 mg total) by mouth 2 (two) times daily., Disp: 60 tablet, Rfl: 11 .  megestrol (MEGACE) 20 MG tablet, Take 1 tablet (20 mg total) by mouth daily., Disp: 30 tablet, Rfl: 4 .  sacubitril-valsartan (ENTRESTO) 49-51 MG, Take 1 tablet by mouth 2 (two) times daily., Disp: 180 tablet, Rfl: 1 .  spironolactone (ALDACTONE) 25 MG tablet, Take 1 tablet (25 mg total) by mouth at bedtime., Disp: 90 tablet, Rfl: 3  Past Medical History: Past Medical History:  Diagnosis Date  . 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 Hepatitis B     Tobacco Use: Social History   Tobacco Use  Smoking Status Former Smoker  . Packs/day: 0.50  . Years: 40.00  . Pack years: 20.00  . Quit date: 16  . Years since quitting: 25.9  Smokeless Tobacco Never Used    Labs: Recent Chemical engineer    Labs for ITP Cardiac and Pulmonary Rehab Latest Ref Rng & Units 01/06/2020 01/06/2020 01/07/2020 05/10/2020 08/15/2020   Cholestrol 0 - 200 mg/dL - - - 137 122   LDLCALC 0 - 99 mg/dL - - - 76 65   HDL >39.00 mg/dL - - - 48.60 47.90   Trlycerides 0 - 149 mg/dL - - - 58.0 47.0   Hemoglobin A1c 4.8 - 5.6 % - -  - - -   PHART 7.35 - 7.45 - - - - -   PCO2ART 32 - 48 mmHg - - - - -   HCO3 20.0 - 28.0 mmol/L - - - - -   TCO2 22 - 32 mmol/L - - - - -   ACIDBASEDEF 0.0 - 2.0 mmol/L - - - - -   O2SAT % 52.7 58.4 57.3 - -       Pulmonary Assessment Scores:   UCSD: Self-administered rating of dyspnea associated with activities of daily living (ADLs) 6-point scale (0 = "not at all" to 5 = "maximal or unable to do because of breathlessness")  Scoring Scores range from 0 to 120.  Minimally important difference is 5 units  CAT: CAT can identify the health impairment of COPD patients and is better correlated with disease progression.  CAT has a scoring range of zero to 40. The CAT score is classified into four groups of low (less than 10), medium (10 - 20), high (21-30) and very high (31-40) based on the impact level of disease on health status. A CAT score over 10 suggests significant symptoms.  A worsening CAT score could be explained by an exacerbation, poor medication adherence, poor inhaler technique, or progression of COPD or comorbid conditions.  CAT  MCID is 2 points  mMRC: mMRC (Modified Medical Research Council) Dyspnea Scale is used to assess the degree of baseline functional disability in patients of respiratory disease due to dyspnea. No minimal important difference is established. A decrease in score of 1 point or greater is considered a positive change.   Pulmonary Function Assessment:   Exercise Target Goals: Exercise Program Goal: Individual exercise prescription set using results from initial 6 min walk test and THRR while considering  patient's activity barriers and safety.   Exercise Prescription Goal: Initial exercise prescription builds to 30-45 minutes a day of aerobic activity, 2-3 days per week.  Home exercise guidelines will be given to patient during program as part of exercise prescription that the participant will acknowledge.  Education: Aerobic Exercise: - Group verbal  and visual presentation on the components of exercise prescription. Introduces F.I.T.T principle from ACSM for exercise prescriptions.  Reviews F.I.T.T. principles of aerobic exercise including progression. Written material given at graduation.   Pulmonary Rehab from 10/12/2020 in [facility] Cardiac and Pulmonary Rehab  Date 09/21/20  Educator [facility]  Instruction Review Code 1- Verbalizes Understanding  [employer] training]      Education: Resistance Exercise: - Group verbal and visual presentation on the components of exercise prescription. Introduces F.I.T.T principle from ACSM for exercise prescriptions  Reviews F.I.T.T. principles of resistance exercise including progression. Written material given at graduation.    Education: Exercise & Equipment Safety: - Individual verbal instruction and demonstration of equipment use and safety with use of the equipment.   Pulmonary Rehab from 10/12/2020 in [facility] Cardiac and Pulmonary Rehab  Date 09/07/20  Educator [facility]  Instruction Review Code 1- Verbalizes Understanding      Education: Exercise Physiology & General Exercise Guidelines: - Group verbal and written instruction with models to review the exercise physiology of the cardiovascular system and associated critical values. Provides general exercise guidelines with specific guidelines to those with heart or lung disease.    Pulmonary Rehab from 10/12/2020 in [facility] Cardiac and Pulmonary Rehab  Education need identified 09/07/20  Date 09/14/20  Educator [facility]  Instruction Review Code 1- [employer] Understanding      Education: Flexibility, Balance, Mind/Body Relaxation: - Group verbal and visual presentation with interactive activity on the components of exercise prescription. Introduces F.I.T.T principle from ACSM for exercise prescriptions. Reviews F.I.T.T. principles of flexibility and balance exercise training including progression. Also discusses the mind body connection.  Reviews various relaxation  techniques to help reduce and manage stress (i.e. Deep breathing, progressive muscle relaxation, and visualization). Balance handout provided to take home. Written material given at graduation.   Pulmonary Rehab from 10/12/2020 in [facility] Cardiac and Pulmonary Rehab  Date 08/03/20  Educator [facility]  Instruction Review Code 1- Verbalizes Understanding      Activity Barriers & Risk Stratification:  Activity Barriers & Cardiac Risk Stratification - 09/07/20 1417      Activity Barriers & Cardiac Risk Stratification   Activity Barriers Deconditioning;Shortness of Breath;Decreased Ventricular Function;Muscular Weakness;Arthritis    Cardiac Risk Stratification High           6 Minute Walk:  6 Minute Walk    Row Name 09/07/20 1506         6 Minute Walk   Phase Initial     Distance 740 feet     Walk Time 6 minutes     # of Rest Breaks 0     MPH 1.4     METS 1.77     RPE 12  Perceived Dyspnea  1     VO2 Peak 6.2     Symptoms Yes (comment)     Comments SOB     Resting HR 72 bpm     Resting BP 128/66     Resting Oxygen Saturation  98 %     Exercise Oxygen Saturation  during 6 min walk 88 %     Max Ex. HR 98 bpm     Max Ex. BP 134/74     2 Minute Post BP 124/64       Interval HR   1 Minute HR 88     2 Minute HR 87     3 Minute HR 92     4 Minute HR 96     5 Minute HR 97     6 Minute HR 98     2 Minute Post HR 87     Interval Heart Rate? Yes       Interval Oxygen   Interval Oxygen? Yes     Baseline Oxygen Saturation % 98 %     1 Minute Oxygen Saturation % 97 %     1 Minute Liters of Oxygen 0 L  Room Air     2 Minute Oxygen Saturation % 94 %     2 Minute Liters of Oxygen 0 L     3 Minute Oxygen Saturation % 95 %     3 Minute Liters of Oxygen 0 L     4 Minute Oxygen Saturation % 88 %     4 Minute Liters of Oxygen 0 L     5 Minute Oxygen Saturation % 91 %     5 Minute Liters of Oxygen 0 L     6 Minute Oxygen Saturation % 93 %     6 Minute Liters of Oxygen 0 L     2  Minute Post Oxygen Saturation % 94 %     2 Minute Post Liters of Oxygen 0 L           Oxygen Initial Assessment:  Oxygen Initial Assessment - 09/07/20 1415      Home Oxygen   Home Oxygen Device None    Sleep Oxygen Prescription None    Home Exercise Oxygen Prescription None    Home Resting Oxygen Prescription None      Initial 6 min Walk   Oxygen Used None      Program Oxygen Prescription   Program Oxygen Prescription None      Intervention   Short Term Goals To learn and understand importance of monitoring SPO2 with pulse oximeter and demonstrate accurate use of the pulse oximeter.;To learn and understand importance of maintaining oxygen saturations>88%;To learn and demonstrate proper pursed lip breathing techniques or other breathing techniques.    Long  Term Goals Verbalizes importance of monitoring SPO2 with pulse oximeter and return demonstration;Maintenance of O2 saturations>88%;Exhibits proper breathing techniques, such as pursed lip breathing or other method taught during program session           Oxygen Re-Evaluation:  Oxygen Re-Evaluation    Row Name 09/08/20 1734 09/28/20 1209           Program Oxygen Prescription   Program Oxygen Prescription None None        Home Oxygen   Home Oxygen Device None None      Sleep Oxygen Prescription None None      Home Exercise Oxygen Prescription None None      Home  Resting Oxygen Prescription None None        Goals/Expected Outcomes   Short Term Goals To learn and understand importance of monitoring SPO2 with pulse oximeter and demonstrate accurate use of the pulse oximeter.;To learn and understand importance of maintaining oxygen saturations>88%;To learn and demonstrate proper pursed lip breathing techniques or other breathing techniques. To learn and understand importance of monitoring SPO2 with pulse oximeter and demonstrate accurate use of the pulse oximeter.;To learn and understand importance of maintaining oxygen  saturations>88%;To learn and demonstrate proper pursed lip breathing techniques or other breathing techniques.      Long  Term Goals Verbalizes importance of monitoring SPO2 with pulse oximeter and return demonstration;Maintenance of O2 saturations>88%;Exhibits proper breathing techniques, such as pursed lip breathing or other method taught during program session Verbalizes importance of monitoring SPO2 with pulse oximeter and return demonstration;Maintenance of O2 saturations>88%;Exhibits proper breathing techniques, such as pursed lip breathing or other method taught during program session      Comments Reviewed PLB technique with pt.  Talked about how it works and it's importance in maintaining their exercise saturations. Al has been doing well with his breathing.  He is using his PLB and finds it helpful.  Saturations have been good in class.  He feels that exercise is helping to improve his breathing overall.      Goals/Expected Outcomes Short: Become more profiecient at using PLB.   Long: Become independent at using PLB. Short: continue to use PLB  Long; continue to improve breathing.             Oxygen Discharge (Final Oxygen Re-Evaluation):  Oxygen Re-Evaluation - 09/28/20 1209      Program Oxygen Prescription   Program Oxygen Prescription None      Home Oxygen   Home Oxygen Device None    Sleep Oxygen Prescription None    Home Exercise Oxygen Prescription None    Home Resting Oxygen Prescription None      Goals/Expected Outcomes   Short Term Goals To learn and understand importance of monitoring SPO2 with pulse oximeter and demonstrate accurate use of the pulse oximeter.;To learn and understand importance of maintaining oxygen saturations>88%;To learn and demonstrate proper pursed lip breathing techniques or other breathing techniques.    Long  Term Goals Verbalizes importance of monitoring SPO2 with pulse oximeter and return demonstration;Maintenance of O2 saturations>88%;Exhibits  proper breathing techniques, such as pursed lip breathing or other method taught during program session    Comments Al has been doing well with his breathing.  He is using his PLB and finds it helpful.  Saturations have been good in class.  He feels that exercise is helping to improve his breathing overall.    Goals/Expected Outcomes Short: continue to use PLB  Long; continue to improve breathing.           Initial Exercise Prescription:  Initial Exercise Prescription - 09/07/20 1500      Date of Initial Exercise RX and Referring Provider   Date 09/07/20    Referring Provider Loralie Champagne MD      Treadmill   MPH 1    Grade 0.5    Minutes 15    METs 1.8      Recumbant Elliptical   Level 1    RPM 50    Minutes 15    METs 1.7      REL-XR   Level 3    Speed 50    Minutes 15    METs 1.7  Prescription Details   Frequency (times per week) 3    Duration Progress to 30 minutes of continuous aerobic without signs/symptoms of physical distress      Intensity   THRR 40-80% of Max Heartrate 99-126    Ratings of Perceived Exertion 11-13    Perceived Dyspnea 0-4      Progression   Progression Continue to progress workloads to maintain intensity without signs/symptoms of physical distress.      Resistance Training   Training Prescription Yes    Weight 3 lb    Reps 10-15           Perform Capillary Blood Glucose checks as needed.  Exercise Prescription Changes:  Exercise Prescription Changes    Row Name 09/07/20 1500 09/14/20 0900 09/28/20 1700 10/12/20 1500       Response to Exercise   Blood Pressure (Admit) 128/66 110/58 120/70 118/76    Blood Pressure (Exercise) 134/74 118/56 138/80 128/50    Blood Pressure (Exit) 124/64 92/60 102/68 106/56    Heart Rate (Admit) 72 bpm 90 bpm 74 bpm 76 bpm    Heart Rate (Exercise) 98 bpm 90 bpm 98 bpm 93 bpm    Heart Rate (Exit) 90 bpm 73 bpm 80 bpm 88 bpm    Oxygen Saturation (Admit) 98 % 97 % 96 % 98 %    Oxygen  Saturation (Exercise) 88 % 97 % 96 % 97 %    Oxygen Saturation (Exit) 96 % 97 % 97 % 97 %    Rating of Perceived Exertion (Exercise) 12 12 14 15     Perceived Dyspnea (Exercise) 1 1 1 2     Symptoms SOB SOB -- SOB, fatigue    Comments walk test results -- -- --    Duration -- Continue with 30 min of aerobic exercise without signs/symptoms of physical distress. Continue with 30 min of aerobic exercise without signs/symptoms of physical distress. Continue with 30 min of aerobic exercise without signs/symptoms of physical distress.    Intensity -- THRR unchanged THRR unchanged THRR unchanged      Progression   Progression -- Continue to progress workloads to maintain intensity without signs/symptoms of physical distress. Continue to progress workloads to maintain intensity without signs/symptoms of physical distress. Continue to progress workloads to maintain intensity without signs/symptoms of physical distress.    Average METs -- 1.8 1.8 1.77      Resistance Training   Training Prescription -- Yes Yes Yes    Weight -- 3 lb 3 lb 3 lb    Reps -- 10-15 10-15 10-15      Interval Training   Interval Training -- No No No      Treadmill   MPH -- 1 -- 1    Grade -- 0.5 -- 0    Minutes -- 15 -- 15    METs -- 1.8 -- 1.77      NuStep   Level -- -- 4 --    SPM -- -- 80 --    Minutes -- -- 15 --    METs -- -- 1.5 --      Recumbant Elliptical   Level -- 1 -- --    Minutes -- 15 -- --    METs -- 1.8 -- --      REL-XR   Level -- 1 -- 1    Minutes -- 15 -- 15      Biostep-RELP   Level -- -- 1 --    SPM -- -- 50 --  Minutes -- -- 15 --    METs -- -- 2 --           Exercise Comments:  Exercise Comments    Row Name 09/30/20 1030 10/07/20 1024         Exercise Comments Cristiano Capri did not complete his rehab session.  Al was upset that he was not allowed to do the elliptical today or a treadmill.  We explained that we have new people coming into the program and have to  rotate equipment from time to time to allow new people to start and for those that have been here to advance.  He was saying that we have never let him try the elliptical in the back of gym at station 10.  When he was in cardiac, he attempted the elliptical at station 4 and was unable to keep the machine on and did not last.  We explained that the one in back is even harder and he still wanted to try.  He was upset that he was not allowed to go back there.  We tried to explain that someone was already on that piece of equipment and it could not be used until she left and it was cleaned as safety protocols for COVID-19 need people to stay 6 ft apart and equipment cleaned between patients.  He said we never explained or even gave him a chance to prove that he could or could not do it.  He resigned to wait until she left to try and to do his station.  Then he felt that he did not know what the station did or what the numbers meant on the machine.  These were explained as best as possible and he was willing to try to keep up the spm on the stepper.  He then said that he couldn't maintain the steps per minutes and his legs weren't strong enough and not where he wants them to be after already completing the cardiac program.  He has not been doing much exercise at home other than resistance training.  He was upset and felt that it was just best to leave at that point.  His vital were good to discharge home. Al is refusing to increase on treadmill today.  He is walking at 0.5 mph only.             Exercise Goals and Review:  Exercise Goals    Row Name 09/07/20 1509             Exercise Goals   Increase Physical Activity Yes       Intervention Provide advice, education, support and counseling about physical activity/exercise needs.;Develop an individualized exercise prescription for aerobic and resistive training based on initial evaluation findings, risk stratification, comorbidities and participant's  personal goals.       Expected Outcomes Short Term: Attend rehab on a regular basis to increase amount of physical activity.;Long Term: Add in home exercise to make exercise part of routine and to increase amount of physical activity.;Long Term: Exercising regularly at least 3-5 days a week.       Increase Strength and Stamina Yes       Intervention Provide advice, education, support and counseling about physical activity/exercise needs.;Develop an individualized exercise prescription for aerobic and resistive training based on initial evaluation findings, risk stratification, comorbidities and participant's personal goals.       Expected Outcomes Short Term: Increase workloads from initial exercise prescription for resistance, speed, and METs.;Short Term:  Perform resistance training exercises routinely during rehab and add in resistance training at home;Long Term: Improve cardiorespiratory fitness, muscular endurance and strength as measured by increased METs and functional capacity (6MWT)       Able to understand and use rate of perceived exertion (RPE) scale Yes       Intervention Provide education and explanation on how to use RPE scale       Expected Outcomes Short Term: Able to use RPE daily in rehab to express subjective intensity level;Long Term:  Able to use RPE to guide intensity level when exercising independently       Able to understand and use Dyspnea scale Yes       Intervention Provide education and explanation on how to use Dyspnea scale       Expected Outcomes Long Term: Able to use Dyspnea scale to guide intensity level when exercising independently;Short Term: Able to use Dyspnea scale daily in rehab to express subjective sense of shortness of breath during exertion       Knowledge and understanding of Target Heart Rate Range (THRR) Yes       Intervention Provide education and explanation of THRR including how the numbers were predicted and where they are located for reference        Expected Outcomes Short Term: Able to state/look up THRR;Short Term: Able to use daily as guideline for intensity in rehab;Long Term: Able to use THRR to govern intensity when exercising independently       Able to check pulse independently Yes       Intervention Provide education and demonstration on how to check pulse in carotid and radial arteries.;Review the importance of being able to check your own pulse for safety during independent exercise       Expected Outcomes Short Term: Able to explain why pulse checking is important during independent exercise;Long Term: Able to check pulse independently and accurately       Understanding of Exercise Prescription Yes       Intervention Provide education, explanation, and written materials on patient's individual exercise prescription       Expected Outcomes Short Term: Able to explain program exercise prescription;Long Term: Able to explain home exercise prescription to exercise independently              Exercise Goals Re-Evaluation :  Exercise Goals Re-Evaluation    Row Name 09/08/20 1733 09/14/20 0953 09/28/20 1157 10/12/20 1503       Exercise Goal Re-Evaluation   Exercise Goals Review Increase Physical Activity;Able to understand and use rate of perceived exertion (RPE) scale;Knowledge and understanding of Target Heart Rate Range (THRR);Understanding of Exercise Prescription;Increase Strength and Stamina;Able to understand and use Dyspnea scale;Able to check pulse independently Increase Physical Activity;Increase Strength and Stamina;Understanding of Exercise Prescription Increase Physical Activity;Increase Strength and Stamina;Understanding of Exercise Prescription Increase Physical Activity;Increase Strength and Stamina;Understanding of Exercise Prescription    Comments Reviewed RPE and dyspnea scales, THR and program prescription with pt today.  Pt voiced understanding and was given a copy of goals to take home. Al is off to a good start in  Pulmonary Rehab.  He is already up to 1.8 METs on the XR.  We will continue to monitor his progress. Al is doing well in rehab.  He is not doing much cardio on his off days, but he is doing his weights.  We talked about the importance of cardio, but he feels he is getting that in class.  Al has  expressed interest in using the ellipitcal, and we have told him that he needs to build up his speed on the XR and treadmill prior to trying the elliptical. Al continues to walk very slowly on the treadmill and is not interested in going any faster.  He did do the XR today, but is still not able to maintain speed of 50 rpm for the entire 15 min.  We will continue to encourage him and to monitor his progress.    Expected Outcomes Short: Use RPE daily to regulate intensity. Long: Follow program prescription in THR. Short: Continue to attend rehab regularly  Long: Conitnue to follow program prescription Short: Continue to try to add in cardio at home Long; conitnue to improve stamina. Short: Increase speed on all machine Long: Continue to work on leg strength           Discharge Exercise Prescription (Final Exercise Prescription Changes):  Exercise Prescription Changes - 10/12/20 1500      Response to Exercise   Blood Pressure (Admit) 118/76    Blood Pressure (Exercise) 128/50    Blood Pressure (Exit) 106/56    Heart Rate (Admit) 76 bpm    Heart Rate (Exercise) 93 bpm    Heart Rate (Exit) 88 bpm    Oxygen Saturation (Admit) 98 %    Oxygen Saturation (Exercise) 97 %    Oxygen Saturation (Exit) 97 %    Rating of Perceived Exertion (Exercise) 15    Perceived Dyspnea (Exercise) 2    Symptoms SOB, fatigue    Duration Continue with 30 min of aerobic exercise without signs/symptoms of physical distress.    Intensity THRR unchanged      Progression   Progression Continue to progress workloads to maintain intensity without signs/symptoms of physical distress.    Average METs 1.77      Resistance Training    Training Prescription Yes    Weight 3 lb    Reps 10-15      Interval Training   Interval Training No      Treadmill   MPH 1    Grade 0    Minutes 15    METs 1.77      REL-XR   Level 1    Minutes 15           Nutrition:  Target Goals: Understanding of nutrition guidelines, daily intake of sodium 1500mg , cholesterol 200mg , calories 30% from fat and 7% or less from saturated fats, daily to have 5 or more servings of fruits and vegetables.  Education: All About Nutrition: -Group instruction provided by verbal, written material, interactive activities, discussions, models, and posters to present general guidelines for heart healthy nutrition including fat, fiber, MyPlate, the role of sodium in heart healthy nutrition, utilization of the nutrition label, and utilization of this knowledge for meal planning. Follow up email sent as well. Written material given at graduation.   Pulmonary Rehab from 10/12/2020 in [facility] Cardiac and Pulmonary Rehab  Education need identified 09/07/20  Date 10/12/20  Educator [place]  Instruction Review Code 5- Refused Teaching      Biometrics:  Pre Biometrics - 09/07/20 1539      Pre Biometrics   Height 5' 5.5" (1.664 m)    Weight 115 lb 12.8 oz (52.5 kg)    BMI (Calculated) 18.97    Single Leg Stand 1.12 seconds            Nutrition Therapy Plan and Nutrition Goals:  Nutrition Therapy & Goals - 09/26/20  0947      Nutrition Therapy   Diet Heart healthy, low sodium    Protein (specify units) 50g    Fiber 30 grams    Whole Grain Foods 3 servings    Saturated Fats 12 max. grams    Fruits and Vegetables 5 servings/day    Sodium 1.5 grams      Personal Nutrition Goals   Nutrition Goal ST: trying out frozen vegetables that are easier to chew in addition to his meals he gets out. LT: maintain weight, include at least 5 fruits or vegetables a day, reduce sodium to 2g/day.    Comments B: soft boiled eggs (2) with whole wheat toast or special K  protein cereal with skim milk - will have coffee (sweet and low) or juice. Puts protein powder in skim milk or juice. He doesn't drink whole milk because it makes him congested. He eats soft foods because he has a hard time chewing. S: fruit (blueberries or banana) L: chicken wings and macaroni salad. D:hamburger from [employer], [place] food vegetable egg rolls, sometimes fish. doesn't cook at home much or prepare things at home.  He doesn't cook and his wife has passed. Reviewed heart healthy eating and easy low prep meals such as rotisserie chicken (with vegetables and grains/potato, with pasta, in soup with low sodium broth and frozen veggies, chicken salad), pasta or bean salad using canned or pre-cut vegetables, or pasta with frozen broccoli, jarred sauce, and beans or chicken. He reports not wanting to get "rolls of fat".      Intervention Plan   Intervention Prescribe, educate and counsel regarding individualized specific dietary modifications aiming towards targeted core components such as weight, hypertension, lipid management, diabetes, heart failure and other comorbidities.;Nutrition handout(s) given to patient.    Expected Outcomes Short Term Goal: Understand basic principles of dietary content, such as calories, fat, sodium, cholesterol and nutrients.;Short Term Goal: A plan has been developed with personal nutrition goals set during dietitian appointment.;Long Term Goal: Adherence to prescribed nutrition plan.           Nutrition Assessments:  Nutrition Assessments - 09/07/20 1544      MEDFICTS Scores   Pre Score 47          MEDIFICTS Score Key:  ?70 Need to make dietary changes   40-70 Heart Healthy Diet  ? 40 Therapeutic Level Cholesterol Diet   Picture Your Plate Scores:  <62 Unhealthy dietary pattern with much room for improvement.  41-50 Dietary pattern unlikely to meet recommendations for good health and room for improvement.  51-60 More healthful dietary pattern,  with some room for improvement.   >60 Healthy dietary pattern, although there may be some specific behaviors that could be improved.   Nutrition Goals Re-Evaluation:   Nutrition Goals Discharge (Final Nutrition Goals Re-Evaluation):   Psychosocial: Target Goals: Acknowledge presence or absence of significant depression and/or stress, maximize coping skills, provide positive support system. Participant is able to verbalize types and ability to use techniques and skills needed for reducing stress and depression.   Education: Stress, Anxiety, and Depression - Group verbal and visual presentation to define topics covered.  Reviews how body is impacted by stress, anxiety, and depression.  Also discusses healthy ways to reduce stress and to treat/manage anxiety and depression.  Written material given at graduation.   Pulmonary Rehab from 10/12/2020 in [facility] Cardiac and Pulmonary Rehab  Date 07/06/20  Educator SB  Instruction Review Code 1- Verbalizes Understanding  Education: Sleep Hygiene -Provides group verbal and written instruction about how sleep can affect your health.  Define sleep hygiene, discuss sleep cycles and impact of sleep habits. Review good sleep hygiene tips.    Initial Review & Psychosocial Screening:  Initial Psych Review & Screening - 09/07/20 1415      Initial Review   Current issues with None Identified    Source of Stress Concerns None Identified      Family Dynamics   Good Support System? Yes   daughters, [name] lives the closest     Barriers   Psychosocial barriers to participate in program The patient should benefit from training in stress management and relaxation.;There are no identifiable barriers or psychosocial needs.      Screening Interventions   Interventions Provide feedback about the scores to participant;Encouraged to exercise;To provide support and resources with identified psychosocial needs    Expected Outcomes Short Term goal: Utilizing  psychosocial counselor, staff and physician to assist with identification of specific Stressors or current issues interfering with healing process. Setting desired goal for each stressor or current issue identified.;Long Term Goal: Stressors or current issues are controlled or eliminated.;Short Term goal: Identification and review with participant of any Quality of Life or Depression concerns found by scoring the questionnaire.;Long Term goal: The participant improves quality of Life and PHQ9 Scores as seen by post scores and/or verbalization of changes           Quality of Life Scores:  Scores of 19 and below usually indicate a poorer quality of life in these areas.  A difference of  2-3 points is a clinically meaningful difference.  A difference of 2-3 points in the total score of the Quality of Life Index has been associated with significant improvement in overall quality of life, self-image, physical symptoms, and general health in studies assessing change in quality of life.  PHQ-9: Recent Review Flowsheet Data    Depression screen [facility] 2/9 09/07/2020 08/15/2020 06/29/2020 06/20/2020 05/10/2020   Decreased Interest 0 0 0 0 0   Down, Depressed, Hopeless 0 0 0 0 0   PHQ - 2 Score 0 0 0 0 0   Altered sleeping 0 - 0 0 0   Tired, decreased energy 0 - 0 0 0   Change in appetite 0 - 0 0 0   Feeling bad or failure about yourself  0 - 0 0 0   Trouble concentrating 0 - 0 0 0   Moving slowly or fidgety/restless 0 - 0 0 0   Suicidal thoughts 0 - 0 0 0   PHQ-9 Score 0 - 0 0 0   Difficult doing work/chores Not difficult at all - - - Not difficult at all     Interpretation of Total Score  Total Score Depression Severity:  1-4 = Minimal depression, 5-9 = Mild depression, 10-14 = Moderate depression, 15-19 = Moderately severe depression, 20-27 = Severe depression   Psychosocial Evaluation and Intervention:  Psychosocial Evaluation - 09/07/20 1539      Psychosocial Evaluation & Interventions    Interventions Encouraged to exercise with the program and follow exercise prescription    Comments Al is now returning to Pulmonary Rehab after finishing cardiac rehab.  He is coming in with heart failure and SOB.  He noticed how much cardiac rehab helped him and wanted to continue to exercise.  He has had a recent medication change and wants to combine it with his rehab to continue to feel better.  He feels that he is back on track to get back to where he wants to be.  He has also started to take the stairs after rehab to help rebuild his stamina.    Expected Outcomes Short: Attend pulmonary rehab and education classes  Long: Continue to focus on positives.           Psychosocial Re-Evaluation:  Psychosocial Re-Evaluation    [place] Name 09/28/20 1159             Psychosocial Re-Evaluation   Current issues with None Identified       Comments Al is doing well in rehab.  He is sleeping the best he has in years!  He denies any major stressors.       Expected Outcomes Short: Continue to exercise and sleep well Long: Continue to stay positive       Interventions Encouraged to attend Pulmonary Rehabilitation for the exercise       Continue Psychosocial Services  Follow up required by staff              Psychosocial Discharge (Final Psychosocial Re-Evaluation):  Psychosocial Re-Evaluation - 09/28/20 1159      Psychosocial Re-Evaluation   Current issues with None Identified    Comments Al is doing well in rehab.  He is sleeping the best he has in years!  He denies any major stressors.    Expected Outcomes Short: Continue to exercise and sleep well Long: Continue to stay positive    Interventions Encouraged to attend Pulmonary Rehabilitation for the exercise    Continue Psychosocial Services  Follow up required by staff           Education: Education Goals: Education classes will be provided on a weekly basis, covering required topics. Participant will state understanding/return  demonstration of topics presented.  Learning Barriers/Preferences:  Learning Barriers/Preferences - 09/07/20 1416      Learning Barriers/Preferences   Learning Barriers None    Learning Preferences None           General Pulmonary Education Topics:  Infection Prevention: - Provides verbal and written material to individual with discussion of infection control including proper hand washing and proper equipment cleaning during exercise session.   Pulmonary Rehab from 10/12/2020 in [facility] Cardiac and Pulmonary Rehab  Date 09/07/20  Educator [facility]  Instruction Review Code 1- Verbalizes Understanding      Falls Prevention: - Provides verbal and written material to individual with discussion of falls prevention and safety.   Pulmonary Rehab from 10/12/2020 in [facility] Cardiac and Pulmonary Rehab  Date 09/07/20  Educator [facility]  Instruction Review Code 1- Verbalizes Understanding      Chronic Lung Disease Review: - Group verbal instruction with posters, models, PowerPoint presentations and videos,  to review new updates, new respiratory medications, new advancements in procedures and treatments. Providing information on websites and "800" numbers for continued self-education. Includes information about supplement oxygen, available portable oxygen systems, continuous and intermittent flow rates, oxygen safety, concentrators, and Medicare reimbursement for oxygen. Explanation of Pulmonary Drugs, including class, frequency, complications, importance of spacers, rinsing mouth after steroid MDI's, and proper cleaning methods for nebulizers. Review of basic lung anatomy and physiology related to function, structure, and complications of lung disease. Review of risk factors. Discussion about methods for diagnosing sleep apnea and types of masks and machines for OSA. Includes a review of the use of types of environmental controls: home humidity, furnaces, filters, dust mite/pet prevention, HEPA vacuums.  Discussion about weather  changes, air quality and the benefits of nasal washing. Instruction on Warning signs, infection symptoms, calling MD promptly, preventive modes, and value of vaccinations. Review of effective airway clearance, coughing and/or vibration techniques. Emphasizing that all should Create an Action Plan. Written material given at graduation.   Pulmonary Rehab from 10/12/2020 in [facility] Cardiac and Pulmonary Rehab  Date 06/22/20  Educator [facility]  Instruction Review Code 1- Verbalizes Understanding      AED/CPR: - Group verbal and written instruction with the use of models to demonstrate the basic use of the AED with the basic ABC's of resuscitation.    Anatomy and Cardiac Procedures: - Group verbal and visual presentation and models provide information about basic cardiac anatomy and function. Reviews the testing methods done to diagnose heart disease and the outcomes of the test results. Describes the treatment choices: Medical Management, Angioplasty, or Coronary Bypass Surgery for treating various heart conditions including Myocardial Infarction, Angina, Valve Disease, and Cardiac Arrhythmias.  Written material given at graduation.   Medication Safety: - Group verbal and visual instruction to review commonly prescribed medications for heart and lung disease. Reviews the medication, class of the drug, and side effects. Includes the steps to properly store meds and maintain the prescription regimen.  Written material given at graduation.   Pulmonary Rehab from 10/12/2020 in [facility] Cardiac and Pulmonary Rehab  Date 08/17/20  Educator SB  Instruction Review Code 1- Verbalizes Understanding      Other: -Provides group and verbal instruction on various topics (see comments)   Knowledge Questionnaire Score:  Knowledge Questionnaire Score - 09/07/20 1548      Knowledge Questionnaire Score   Pre Score 12/18 Education Focus: Lung Disease, O2 safety, Nutrition            Core  Components/Risk Factors/Patient Goals at Admission:  Personal Goals and Risk Factors at Admission - 09/07/20 1549      Core Components/Risk Factors/Patient Goals on Admission    Weight Management Yes;Weight Maintenance    Intervention Weight Management: Develop a combined nutrition and exercise program designed to reach desired caloric intake, while maintaining appropriate intake of nutrient and fiber, sodium and fats, and appropriate energy expenditure required for the weight goal.;Weight Management: Provide education and appropriate resources to help participant work on and attain dietary goals.    Admit Weight 115 lb 12.8 oz (52.5 kg)    Goal Weight: Short Term 115 lb (52.2 kg)    Goal Weight: Long Term 115 lb (52.2 kg)    Expected Outcomes Short Term: Continue to assess and modify interventions until short term weight is achieved;Long Term: Adherence to nutrition and physical activity/exercise program aimed toward attainment of established weight goal;Weight Maintenance: Understanding of the daily nutrition guidelines, which includes 25-35% calories from fat, 7% or less cal from saturated fats, less than 200mg  cholesterol, less than 1.5gm of sodium, & 5 or more servings of fruits and vegetables daily    Improve shortness of breath with ADL's Yes    Intervention Provide education, individualized exercise plan and daily activity instruction to help decrease symptoms of SOB with activities of daily living.    Expected Outcomes Short Term: Improve cardiorespiratory fitness to achieve a reduction of symptoms when performing ADLs;Long Term: Be able to perform more ADLs without symptoms or delay the onset of symptoms    Heart Failure Yes    Intervention Provide a combined exercise and nutrition program that is supplemented with education, support and counseling about heart failure. Directed toward relieving symptoms  such as shortness of breath, decreased exercise tolerance, and extremity edema.     Expected Outcomes Improve functional capacity of life;Short term: Daily weights obtained and reported for increase. Utilizing diuretic protocols set by physician.;Short term: Attendance in program 2-3 days a week with increased exercise capacity. Reported lower sodium intake. Reported increased fruit and vegetable intake. Reports medication compliance.;Long term: Adoption of self-care skills and reduction of barriers for early signs and symptoms recognition and intervention leading to self-care maintenance.    Hypertension Yes    Intervention Monitor prescription use compliance.;Provide education on lifestyle modifcations including regular physical activity/exercise, weight management, moderate sodium restriction and increased consumption of fresh fruit, vegetables, and low fat dairy, alcohol moderation, and smoking cessation.    Expected Outcomes Short Term: Continued assessment and intervention until BP is < 140/50mm HG in hypertensive participants. < 130/41mm HG in hypertensive participants with diabetes, heart failure or chronic kidney disease.;Long Term: Maintenance of blood pressure at goal levels.    Lipids Yes    Intervention Provide education and support for participant on nutrition & aerobic/resistive exercise along with prescribed medications to achieve LDL 70mg , HDL >40mg .    Expected Outcomes Short Term: Participant states understanding of desired cholesterol values and is compliant with medications prescribed. Participant is following exercise prescription and nutrition guidelines.;Long Term: Cholesterol controlled with medications as prescribed, with individualized exercise RX and with personalized nutrition plan. Value goals: LDL < 70mg , HDL > 40 mg.           Education:Diabetes - Individual verbal and written instruction to review signs/symptoms of diabetes, desired ranges of glucose level fasting, after meals and with exercise. Acknowledge that pre and post exercise glucose checks  will be done for 3 sessions at entry of program.   Know Your Numbers and Heart Failure: - Group verbal and visual instruction to discuss disease risk factors for cardiac and pulmonary disease and treatment options.  Reviews associated critical values for Overweight/Obesity, Hypertension, Cholesterol, and Diabetes.  Discusses basics of heart failure: signs/symptoms and treatments.  Introduces Heart Failure Zone chart for action plan for heart failure.  Written material given at graduation.   Core Components/Risk Factors/Patient Goals Review:   Goals and Risk Factor Review    Row Name 09/28/20 1206             Core Components/Risk Factors/Patient Goals Review   Personal Goals Review Weight Management/Obesity;Hypertension;Heart Failure;Lipids       Review Al is doing well in rehab.  His weight is holding pretty steady.  He denies any heart failure symptoms.  His pressures have been doing fairly well in class, but he is not checking them at home.  We talked about the importance of keeping an eye on it at home.       Expected Outcomes Short: Start checking blood pressures at home  Long: Continue to monitor risk factors.              Core Components/Risk Factors/Patient Goals at Discharge (Final Review):   Goals and Risk Factor Review - 09/28/20 1206      Core Components/Risk Factors/Patient Goals Review   Personal Goals Review Weight Management/Obesity;Hypertension;Heart Failure;Lipids    Review Al is doing well in rehab.  His weight is holding pretty steady.  He denies any heart failure symptoms.  His pressures have been doing fairly well in class, but he is not checking them at home.  We talked about the importance of keeping an eye on it at home.  Expected Outcomes Short: Start checking blood pressures at home  Long: Continue to monitor risk factors.           ITP Comments:  ITP Comments    Row Name 09/07/20 1503 09/08/20 1731 09/30/20 1030 10/05/20 0647 10/05/20 0648   ITP  Comments Completed 6MWT and gym orientation. Initial ITP created and sent for review to Dr. Emily Filbert, Medical Director. First full day of exercise!  Patient was oriented to gym and equipment including functions, settings, policies, and procedures.  Patient's individual exercise prescription and treatment plan were reviewed.  All starting workloads were established based on the results of the 6 minute walk test done at initial orientation visit.  The plan for exercise progression was also introduced and progression will be customized based on patient's performance and goals. [name] did not complete his rehab session.  Al was upset that he was not allowed to do the elliptical today or a treadmill.  We explained that we have new people coming into the program and have to rotate equipment from time to time to allow new people to start and for those that have been here to advance.  He was saying that we have never let him try the elliptical in the back of gym at station 10.  When he was in cardiac, he attempted the elliptical at station 4 and was unable to keep the machine on and did not last.  We explained that the one in back is even harder and he still wanted to try.  He was upset that he was not allowed to go back there.  We tried to explain that someone was already on that piece of equipment and it could not be used until she left and it was cleaned as safety protocols for COVID-19 need people to stay 6 ft apart and equipment cleaned between patients.  He said we never explained or even gave him a chance to prove that he could or could not do it.  He resigned to wait until she left to try and to do his station.  Then he felt that he did not know what the station did or what the numbers meant on the machine.  These were explained as best as possible and he was willing to try to keep up the spm on the stepper.  He then said that he couldn't maintain the steps per minutes and his legs weren't strong  enough and not where he wants them to be after already completing the cardiac program.  He has not been doing much exercise at home other than resistance training.  He was upset and felt that it was just best to leave at that point.  His vital were good to discharge home. Al did return to the program and was able to exercise without any voiced concerns 30 Day review completed. Medical Director ITP review done, changes made as directed, and signed approval by Medical Director.   [place] Name 11/02/20 0953           ITP Comments 30 Day review completed. Medical Director ITP review done, changes made as directed, and signed approval by Medical Director.              Comments:

## 2020-11-04 ENCOUNTER — Inpatient Hospital Stay (HOSPITAL_BASED_OUTPATIENT_CLINIC_OR_DEPARTMENT_OTHER): Payer: Medicare Other | Admitting: Oncology

## 2020-11-04 ENCOUNTER — Encounter: Payer: Self-pay | Admitting: Emergency Medicine

## 2020-11-04 ENCOUNTER — Encounter: Payer: Self-pay | Admitting: Oncology

## 2020-11-04 ENCOUNTER — Emergency Department: Payer: Medicare Other

## 2020-11-04 ENCOUNTER — Inpatient Hospital Stay
Admission: EM | Admit: 2020-11-04 | Discharge: 2020-12-03 | DRG: 314 | Disposition: E | Payer: Medicare Other | Attending: Obstetrics and Gynecology | Admitting: Obstetrics and Gynecology

## 2020-11-04 ENCOUNTER — Other Ambulatory Visit: Payer: Self-pay

## 2020-11-04 VITALS — BP 85/50 | HR 80 | Temp 98.4°F | Resp 18 | Wt 101.7 lb

## 2020-11-04 DIAGNOSIS — F0390 Unspecified dementia without behavioral disturbance: Secondary | ICD-10-CM | POA: Diagnosis present

## 2020-11-04 DIAGNOSIS — N179 Acute kidney failure, unspecified: Secondary | ICD-10-CM | POA: Diagnosis present

## 2020-11-04 DIAGNOSIS — E861 Hypovolemia: Secondary | ICD-10-CM | POA: Diagnosis not present

## 2020-11-04 DIAGNOSIS — Z7189 Other specified counseling: Secondary | ICD-10-CM

## 2020-11-04 DIAGNOSIS — I252 Old myocardial infarction: Secondary | ICD-10-CM

## 2020-11-04 DIAGNOSIS — Z66 Do not resuscitate: Secondary | ICD-10-CM | POA: Diagnosis present

## 2020-11-04 DIAGNOSIS — R4781 Slurred speech: Secondary | ICD-10-CM | POA: Diagnosis present

## 2020-11-04 DIAGNOSIS — I639 Cerebral infarction, unspecified: Secondary | ICD-10-CM

## 2020-11-04 DIAGNOSIS — I248 Other forms of acute ischemic heart disease: Secondary | ICD-10-CM | POA: Diagnosis not present

## 2020-11-04 DIAGNOSIS — I5022 Chronic systolic (congestive) heart failure: Secondary | ICD-10-CM

## 2020-11-04 DIAGNOSIS — I272 Pulmonary hypertension, unspecified: Secondary | ICD-10-CM | POA: Diagnosis present

## 2020-11-04 DIAGNOSIS — J690 Pneumonitis due to inhalation of food and vomit: Secondary | ICD-10-CM | POA: Diagnosis not present

## 2020-11-04 DIAGNOSIS — D539 Nutritional anemia, unspecified: Secondary | ICD-10-CM

## 2020-11-04 DIAGNOSIS — R627 Adult failure to thrive: Secondary | ICD-10-CM | POA: Diagnosis present

## 2020-11-04 DIAGNOSIS — R634 Abnormal weight loss: Secondary | ICD-10-CM

## 2020-11-04 DIAGNOSIS — E43 Unspecified severe protein-calorie malnutrition: Secondary | ICD-10-CM | POA: Diagnosis present

## 2020-11-04 DIAGNOSIS — J9601 Acute respiratory failure with hypoxia: Secondary | ICD-10-CM | POA: Diagnosis not present

## 2020-11-04 DIAGNOSIS — I452 Bifascicular block: Secondary | ICD-10-CM | POA: Diagnosis present

## 2020-11-04 DIAGNOSIS — Z681 Body mass index (BMI) 19 or less, adult: Secondary | ICD-10-CM

## 2020-11-04 DIAGNOSIS — I2510 Atherosclerotic heart disease of native coronary artery without angina pectoris: Secondary | ICD-10-CM | POA: Diagnosis present

## 2020-11-04 DIAGNOSIS — I480 Paroxysmal atrial fibrillation: Secondary | ICD-10-CM | POA: Diagnosis not present

## 2020-11-04 DIAGNOSIS — Z87891 Personal history of nicotine dependence: Secondary | ICD-10-CM

## 2020-11-04 DIAGNOSIS — R6520 Severe sepsis without septic shock: Secondary | ICD-10-CM | POA: Diagnosis not present

## 2020-11-04 DIAGNOSIS — I959 Hypotension, unspecified: Secondary | ICD-10-CM | POA: Diagnosis not present

## 2020-11-04 DIAGNOSIS — Z79899 Other long term (current) drug therapy: Secondary | ICD-10-CM

## 2020-11-04 DIAGNOSIS — A419 Sepsis, unspecified organism: Secondary | ICD-10-CM | POA: Diagnosis not present

## 2020-11-04 DIAGNOSIS — R5381 Other malaise: Secondary | ICD-10-CM | POA: Diagnosis present

## 2020-11-04 DIAGNOSIS — J392 Other diseases of pharynx: Secondary | ICD-10-CM | POA: Diagnosis present

## 2020-11-04 DIAGNOSIS — Z951 Presence of aortocoronary bypass graft: Secondary | ICD-10-CM

## 2020-11-04 DIAGNOSIS — Z515 Encounter for palliative care: Secondary | ICD-10-CM

## 2020-11-04 DIAGNOSIS — R64 Cachexia: Secondary | ICD-10-CM | POA: Diagnosis present

## 2020-11-04 DIAGNOSIS — D469 Myelodysplastic syndrome, unspecified: Secondary | ICD-10-CM | POA: Diagnosis present

## 2020-11-04 DIAGNOSIS — E860 Dehydration: Secondary | ICD-10-CM | POA: Diagnosis present

## 2020-11-04 DIAGNOSIS — Z20822 Contact with and (suspected) exposure to covid-19: Secondary | ICD-10-CM | POA: Diagnosis present

## 2020-11-04 DIAGNOSIS — Z7901 Long term (current) use of anticoagulants: Secondary | ICD-10-CM

## 2020-11-04 DIAGNOSIS — I9589 Other hypotension: Principal | ICD-10-CM | POA: Diagnosis present

## 2020-11-04 DIAGNOSIS — G9341 Metabolic encephalopathy: Secondary | ICD-10-CM | POA: Diagnosis not present

## 2020-11-04 DIAGNOSIS — I951 Orthostatic hypotension: Secondary | ICD-10-CM

## 2020-11-04 DIAGNOSIS — I110 Hypertensive heart disease with heart failure: Secondary | ICD-10-CM | POA: Diagnosis present

## 2020-11-04 DIAGNOSIS — I6340 Cerebral infarction due to embolism of unspecified cerebral artery: Secondary | ICD-10-CM | POA: Diagnosis not present

## 2020-11-04 DIAGNOSIS — K117 Disturbances of salivary secretion: Secondary | ICD-10-CM

## 2020-11-04 DIAGNOSIS — I5023 Acute on chronic systolic (congestive) heart failure: Secondary | ICD-10-CM

## 2020-11-04 DIAGNOSIS — E46 Unspecified protein-calorie malnutrition: Secondary | ICD-10-CM | POA: Diagnosis present

## 2020-11-04 DIAGNOSIS — Z809 Family history of malignant neoplasm, unspecified: Secondary | ICD-10-CM

## 2020-11-04 DIAGNOSIS — D638 Anemia in other chronic diseases classified elsewhere: Secondary | ICD-10-CM | POA: Diagnosis present

## 2020-11-04 LAB — BASIC METABOLIC PANEL
Anion gap: 9 (ref 5–15)
BUN: 28 mg/dL — ABNORMAL HIGH (ref 8–23)
CO2: 25 mmol/L (ref 22–32)
Calcium: 9.4 mg/dL (ref 8.9–10.3)
Chloride: 104 mmol/L (ref 98–111)
Creatinine, Ser: 1.35 mg/dL — ABNORMAL HIGH (ref 0.61–1.24)
GFR, Estimated: 53 mL/min — ABNORMAL LOW (ref >60)
Glucose, Bld: 124 mg/dL — ABNORMAL HIGH (ref 70–99)
Potassium: 4.6 mmol/L (ref 3.5–5.1)
Sodium: 138 mmol/L (ref 135–145)

## 2020-11-04 LAB — CBC
HCT: 26.2 % — ABNORMAL LOW (ref 39.0–52.0)
Hemoglobin: 8.6 g/dL — ABNORMAL LOW (ref 13.0–17.0)
MCH: 35.8 pg — ABNORMAL HIGH (ref 26.0–34.0)
MCHC: 32.8 g/dL (ref 30.0–36.0)
MCV: 109.2 fL — ABNORMAL HIGH (ref 80.0–100.0)
Platelets: 311 10*3/uL (ref 150–400)
RBC: 2.4 MIL/uL — ABNORMAL LOW (ref 4.22–5.81)
RDW: 19.2 % — ABNORMAL HIGH (ref 11.5–15.5)
WBC: 6.5 10*3/uL (ref 4.0–10.5)
nRBC: 0 % (ref 0.0–0.2)

## 2020-11-04 LAB — RESP PANEL BY RT-PCR (FLU A&B, COVID) ARPGX2
Influenza A by PCR: NEGATIVE
Influenza B by PCR: NEGATIVE
SARS Coronavirus 2 by RT PCR: NEGATIVE

## 2020-11-04 LAB — TROPONIN I (HIGH SENSITIVITY): Troponin I (High Sensitivity): 10 ng/L (ref <18)

## 2020-11-04 MED ORDER — SODIUM CHLORIDE 0.9 % IV BOLUS: 1000.0000 mL | Freq: Once | INTRAVENOUS | Status: DC

## 2020-11-04 MED ORDER — SODIUM CHLORIDE 0.9 % IV BOLUS
500.0000 mL | Freq: Once | INTRAVENOUS | Status: AC
  Administered 2020-11-04: 500 mL via INTRAVENOUS

## 2020-11-04 MED ORDER — ACETAMINOPHEN 650 MG RE SUPP: 325.0000 mg | Freq: Four times a day (QID) | RECTAL | Status: DC | PRN

## 2020-11-04 MED ORDER — ONDANSETRON HCL 4 MG PO TABS: 4.0000 mg | ORAL_TABLET | Freq: Four times a day (QID) | ORAL | Status: DC | PRN

## 2020-11-04 MED ORDER — MIDODRINE HCL 5 MG PO TABS: 2.5000 mg | ORAL_TABLET | Freq: Three times a day (TID) | ORAL | Status: DC

## 2020-11-04 MED ORDER — ACETAMINOPHEN 325 MG PO TABS
325.0000 mg | ORAL_TABLET | Freq: Four times a day (QID) | ORAL | Status: DC | PRN
  Filled 2020-11-04: qty 1

## 2020-11-04 MED ORDER — MEGESTROL ACETATE 20 MG PO TABS
20.0000 mg | ORAL_TABLET | Freq: Every day | ORAL | Status: DC
  Administered 2020-11-05 – 2020-11-06 (×2): 20 mg via ORAL
  Filled 2020-11-04 (×4): qty 1

## 2020-11-04 MED ORDER — SODIUM CHLORIDE 0.9 % IV SOLN: INTRAVENOUS | Status: DC

## 2020-11-04 MED ORDER — SODIUM CHLORIDE 0.9 % IV SOLN: Freq: Once | INTRAVENOUS | Status: AC

## 2020-11-04 MED ORDER — ONDANSETRON HCL 4 MG/2ML IJ SOLN: 4.0000 mg | Freq: Four times a day (QID) | INTRAMUSCULAR | Status: DC | PRN

## 2020-11-04 MED ORDER — IOHEXOL 300 MG/ML  SOLN
75.0000 mL | Freq: Once | INTRAMUSCULAR | Status: AC | PRN
  Administered 2020-11-04: 75 mL via INTRAVENOUS

## 2020-11-04 MED ORDER — ATORVASTATIN CALCIUM 80 MG PO TABS
80.0000 mg | ORAL_TABLET | Freq: Every day | ORAL | Status: DC
  Filled 2020-11-04: qty 4

## 2020-11-04 MED ORDER — APIXABAN 2.5 MG PO TABS
2.5000 mg | ORAL_TABLET | Freq: Two times a day (BID) | ORAL | Status: DC
  Administered 2020-11-04 – 2020-11-07 (×6): 2.5 mg via ORAL
  Filled 2020-11-04 (×8): qty 1

## 2020-11-04 NOTE — ED Notes (Signed)
Pt presents to ED stating he was sent from his oncologist office due to hypotension. Pt presents with a normal BP at this time. Daughter states they were also sent here due to a drop in his H&H. Pt denies chest pain or SOB and actually states "I feel fine other than being cold". Pt denies black tarry stools or any dark emesis to this RN. Pt states a recent fall on Wednesday but denies any complaints from this fall at this time, pt denies hitting his head during this fall. Pt states he does take a blood thinner "because he had open heart surgery" per the daughter. Daughter also states a decline in his ambulation status and states he can normally walk but has recently resorted to a wheelchair for his only mode of getting around. Pt is A&Ox4 and states "he feels great" and seems upset to be there at this time.

## 2020-11-04 NOTE — H&P (Addendum)
History and Physical   David Perez ACZ:[id number] DOB: 06-01-1941 DOA: 11/29/2020  PCP: David Regal, NP  Outpatient Specialists: Dr. Aundra Perez, [place] Patient coming from: hematology clinic  I have personally briefly reviewed patient's old medical records in [place].  Chief Concern: hypotension   HPI: David Perez is a 79 y.o. 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  At bedside he is awake and alert to self, current year is 2021, and he struggles with his age finally giving me the age 38 years old, and that he is in the hospital.   ROS headache, vision changes, was negative chest pain and shortness, dysuria, hematuria, diarrhea, blood in his stool, weakness, swelling in his lower extremity, fever, chills, cough, sick contacts.  He endorses being 'despondent' and he reports that he has not looked forward to going to the gym.   He endorses poor PO intake and has intentionally lost 20 pounds in the last year. He states that in the last few months, he has been feeling too cold and did not want to the gym instead endorsing staying in bed longer. He endorses some antagonism with some gym members further driving him away for want to exercise.   Social history: Lives by himself. 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 He formerly 0.5 ppd smoked tobacco and quit about 20-25 years ago. Never consumed etoh or recreational drug use  ED Course: Gust with ED provider, requesting admission for hypotension responsive to fluids likely secondary to poor p.o. intake.  He is  status post 2 L normal saline bolus with appropriate map response.  Review of Systems: As per HPI otherwise 10 point review of systems negative.  Assessment/Plan  Principal Problem:   Hypotension due to hypovolemia Active Problems:   S/P CABG x 3   Debility   Anemia   AKI (acute kidney injury) (HCC)   Chronic anticoagulation   Weight loss   Protein malnutrition (HCC)   Acute kidney injury-suspect secondary to poor p.o. intake Hypotension responsive to fluids-etiology work-up includes poor p.o. 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 -Multiple myeloma panel showed positive serum IgG, elevated gamma, polyclonal increase detected in 1 or more immunoglobulins -Per hematology note patient declines bone marrow biopsy and would like to monitor his CBCs -kappa lambda light chain ratio elevated -Reticulocyte panel showed immature reticulocyte fraction  Protein malnutrition-dietary consult to registered dietitian, resumed Megace  Palliative care-recommend primary team to consult palliative medicine  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   DVT prophylaxis: on eliquis  Code Status: full code  Diet: cardiac healthy Family Communication: daughter knows he is here Disposition Plan: Pending clinical course Consults called: None at this time Admission status: Observation with telemetry  Past Medical History:  Diagnosis Date  . 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 Hepatitis B    Past Surgical History:  Procedure Laterality Date  . AORTIC VALVE REPLACEMENT N/A 12/31/2019   Procedure: AORTIC VALVE REPLACEMENT (AVR);  Surgeon: Wonda Olds, MD;  Location: [facility];  Service: Open Heart Surgery;  Laterality: N/A;  . CLIPPING OF ATRIAL APPENDAGE Left 12/31/2019   Procedure: Clipping Of Atrial Appendage;  Surgeon: Wonda Olds, MD;  Location: [facility];  Service: Open Heart Surgery;  Laterality: Left;  . CORONARY ARTERY BYPASS GRAFT N/A 12/31/2019   Procedure: CORONARY ARTERY BYPASS GRAFTING (CABG) TIMES THREE USING LEFT INTERNAL MAMMARY ARTERY AND LEFT GREATER SAPHENOUS LEG VEIN HARVESTED ENDOSCOPICALLY;  Surgeon: Wonda Olds, MD;  Location: [facility];  Service: Open Heart Surgery;  Laterality: N/A;  POSSIBLE BILATERAL IMA  . CORONARY/GRAFT ACUTE MI REVASCULARIZATION N/A 12/31/2019   Procedure: Coronary/Graft Acute MI Revascularization;  Surgeon: Yolonda Kida, MD;  Location: [facility];  Service: Cardiovascular;  Laterality: N/A;  . IR THORACENTESIS ASP PLEURAL SPACE W/IMG GUIDE  01/14/2020  . LEFT HEART CATH AND CORONARY ANGIOGRAPHY N/A 12/31/2019   Procedure: LEFT HEART CATH AND CORONARY ANGIOGRAPHY;  Surgeon: Yolonda Kida, MD;  Location: [facility];  Service: Cardiovascular;  Laterality: N/A;  . MAZE N/A 12/31/2019   Procedure: MAZE;  Surgeon: Wonda Olds, MD;  Location: [facility];  Service: Open Heart Surgery;  Laterality: N/A;  . TEE WITHOUT CARDIOVERSION N/A 12/31/2019   Procedure: TRANSESOPHAGEAL ECHOCARDIOGRAM (TEE);   Surgeon: Wonda Olds, MD;  Location: [facility];  Service: Open Heart Surgery;  Laterality: N/A;   Social History:  reports that he quit smoking about 25 years ago. He has a 20.00 pack-year smoking history. He has never used smokeless tobacco. He reports previous alcohol use. He reports previous drug use.  No Known Allergies Family History  Problem Relation Age of Onset  . Cancer Father    Family history: Family history reviewed and not pertinent  Prior to Admission medications   Medication Sig Start Date End Date Taking? Authorizing Provider  apixaban (ELIQUIS) 2.5 MG TABS tablet Take 1 tablet (2.5 mg total) by mouth every 12 (twelve) hours. 02/25/20   Clegg, Hamlet Lasecki D, NP  atorvastatin (LIPITOR) 80 MG tablet Take 1 tablet (80 mg total) by mouth daily at 6 PM. 02/25/20   Clegg, Taegan Haider D, NP  carvedilol (COREG) 6.25 MG tablet Take 1 tablet (6.25 mg total) by mouth 2 (two) times daily. 08/22/20   Larey Dresser, MD  megestrol (MEGACE) 20 MG tablet Take 1 tablet (20 mg total) by mouth daily. 08/15/20   David Regal, NP  sacubitril-valsartan (ENTRESTO) 49-51 MG Take 1 tablet by mouth 2 (two) times daily. 09/08/20   Larey Dresser, MD  spironolactone (ALDACTONE) 25 MG tablet Take 1 tablet (25 mg total) by mouth at bedtime. 04/18/20   Darrick Grinder D, NP   Physical Exam: Vitals:   11/26/2020 2100 11/13/2020 2103 11/18/2020 2105 12/01/2020 2203  BP: (!) 96/56 (!) 65/48 90/60 (!) 153/124  Pulse:  79 91  Resp: 16 20 (!) 22 20  Temp:      TempSrc:      SpO2: 99% 98% 93% 99%  Weight:      Height:       Constitutional: appears age appropriate, NAD, calm, comfortable Eyes: PERRL, lids and conjunctivae normal ENMT: Mucous membranes are moist. Posterior pharynx clear of any exudate or lesions. Age-appropriate dentition. Hearing appropriate Neck: normal, supple, no masses, no thyromegaly Respiratory: clear to auscultation bilaterally, no wheezing, no crackles. Normal respiratory effort. No accessory muscle  use.  Cardiovascular: Regular rate and rhythm, no murmurs / rubs / gallops. No extremity edema. 2+ pedal pulses. No carotid bruits.  Abdomen: no tenderness, no masses palpated, no hepatosplenomegaly. Bowel sounds positive.  Musculoskeletal: no clubbing / cyanosis. No joint deformity upper and lower extremities. Good ROM, no contractures, no atrophy. Normal muscle tone.  Skin: no rashes, lesions, ulcers. No induration Neurologic: Sensation intact. Strength 5/5 in all 4.  Psychiatric: Normal judgment and insight. Alert and oriented x 3. Normal mood.   EKG: Independently reviewed, showing rate of 71, wide QRS, QTC 430 unchanged from EKG on 02/25/2020 and 09/06/2020  Chest x-ray on Admission: Personally reviewed and I agree with radiologist reading as below.  CT ABDOMEN PELVIS W CONTRAST  Result Date: 11/02/2020 CLINICAL DATA:  [place] 2 weeks ago, right lower back pain, hypotension EXAM: CT ABDOMEN AND PELVIS WITH CONTRAST TECHNIQUE: Multidetector CT imaging of the abdomen and pelvis was performed using the standard protocol following bolus administration of intravenous contrast. CONTRAST:  18m OMNIPAQUE IOHEXOL 300 MG/ML  SOLN COMPARISON:  01/14/2020 FINDINGS: Lower chest: Emphysema, with bibasilar areas of scarring and fibrosis. No acute pleural or parenchymal lung disease. Hepatobiliary: No focal liver abnormality is seen. No gallstones, gallbladder wall thickening, or biliary dilatation. Pancreas: Unremarkable. No pancreatic ductal dilatation or surrounding inflammatory changes. Spleen: Normal in size without focal abnormality. Adrenals/Urinary Tract: Adrenal glands are unremarkable. Kidneys are normal, without renal calculi, focal lesion, or hydronephrosis. Bladder is unremarkable. Stomach/Bowel: No bowel obstruction or ileus. Portions of the descending and sigmoid colon are contained within a left inguinal hernia, with no evidence of obstruction, incarceration, or bowel ischemia. Vascular/Lymphatic:  Aortic atherosclerosis. No enlarged abdominal or pelvic lymph nodes. Reproductive: Prostate is unremarkable. Other: There is a small amount of free fluid within the lower pelvis, as well as within the left inguinal hernia. No free intraperitoneal gas. Musculoskeletal: No acute or destructive bony lesions. Reconstructed images demonstrate no additional findings. IMPRESSION: 1. Left inguinal hernia containing a portion of the distal descending and sigmoid colon. No bowel obstruction, incarceration, or strangulation. 2. Trace free fluid within the lower pelvis and left inguinal hernia sac. 3. Aortic Atherosclerosis (ICD10-I70.0) and Emphysema (ICD10-J43.9). Electronically Signed   By: MRanda NgoM.D.   On: 11/30/2020 21:59   DG Chest Portable 1 View  Result Date: 11/09/2020 CLINICAL DATA:  [place], hypotension EXAM: PORTABLE CHEST 1 VIEW COMPARISON:  02/29/2020 FINDINGS: Single frontal view of the chest demonstrates postsurgical changes from CABG and aortic valve replacement. Cardiac silhouette is unremarkable. No airspace disease, effusion, or pneumothorax. No acute bony abnormalities. IMPRESSION: 1. No acute intrathoracic process. Electronically Signed   By: MRanda NgoM.D.   On: 12/01/2020 17:50   DG Hip Unilat W or Wo Pelvis 2-3 Views Right  Result Date: 11/13/2020 CLINICAL DATA:  Right hip pain after fall 1 week ago EXAM: DG HIP (WITH OR WITHOUT PELVIS) 2-3V RIGHT COMPARISON:  None. FINDINGS: Frontal  view of the pelvis as well as frontal and frogleg lateral views of the right hip are obtained. No fracture, subluxation, or dislocation. Mild symmetrical hip osteoarthritis. Moderate degenerative changes at the lumbosacral junction. IMPRESSION: 1. No acute bony abnormality. 2. Degenerative changes of the bilateral hips and lumbar spine. Electronically Signed   By: Randa Ngo M.D.   On: 12/01/2020 17:49   Labs on Admission: I have personally reviewed following labs  CBC: Recent Labs   Lab 11/02/20 1131 11/16/2020 1358  WBC 8.4 6.5  NEUTROABS 7.0  --   HGB 8.1* 8.6*  HCT 24.8* 26.2*  MCV 109.7* 109.2*  PLT 294 867   Basic Metabolic Panel: Recent Labs  Lab 11/02/20 1131 11/26/2020 1358  NA 140 138  K 4.3 4.6  CL 105 104  CO2 26 25  GLUCOSE 171* 124*  BUN 32* 28*  CREATININE 1.09 1.35*  CALCIUM 9.2 9.4   GFR: Estimated Creatinine Clearance: 27.8 mL/min (A) (by C-G formula based on SCr of 1.35 mg/dL (H)). Liver Function Tests: Recent Labs  Lab 11/02/20 1131  AST 67*  ALT 103*  ALKPHOS 75  BILITOT 0.9  PROT 6.9  ALBUMIN 2.4*   Urine analysis:    Component Value Date/Time   COLORURINE YELLOW 12/31/2019 1220   APPEARANCEUR HAZY (A) 12/31/2019 1220   LABSPEC 1.043 (H) 12/31/2019 1220   PHURINE 5.0 12/31/2019 1220   GLUCOSEU NEGATIVE 12/31/2019 1220   HGBUR NEGATIVE 12/31/2019 1220   BILIRUBINUR NEGATIVE 12/31/2019 1220   KETONESUR 5 (A) 12/31/2019 1220   PROTEINUR NEGATIVE 12/31/2019 1220   NITRITE NEGATIVE 12/31/2019 1220   LEUKOCYTESUR NEGATIVE 12/31/2019 1220   Jerine Surles N Ronella Plunk D.O. [facility]  If 12AM-7AM, please contact overnight-coverage provider If 7AM-7PM, please contact day coverage provider [email or web address]  11/02/2020, 11:19 PM

## 2020-11-04 NOTE — Progress Notes (Signed)
Hematology/Oncology note [facility] [phone] [phone]   Patient Care Team: Marval Regal, NP as PCP - General (Nurse Practitioner) Earlie Server, MD as Consulting Physician (Oncology)  REFERRING PROVIDER: Marval Regal, NP  CHIEF COMPLAINTS/REASON FOR VISIT:  Evaluation of anemia  HISTORY OF PRESENTING ILLNESS:   David Perez is a  79 y.o.  male with PMH listed below was seen in consultation at the request of  Marval Regal, NP  for evaluation of anemia Reviewed patient's previous blood work.  05/10/2020, hemoglobin was 10.9, MCV 107.3, platelet count 245,000, WBC 9.2, monocyte 4.1.  Reviewed patient's previous blood work.  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  No earlier blood work is available. Patient had folate and vitamin B12 level checked which were all normal.  Adequate iron stores. Patient was referred to hematology for further evaluation  Patient reports that he had surgery in January 2 721.  Recently his heart medication has been adjusted by cardiologist.  Chronic anticoagulation with Eliquis 2.5 mg twice daily, he has recently been started on entresto,   INTERVAL HISTORY David Perez is a 79 y.o. male who has above history reviewed by me today presents for follow up visit for management of anemia Problems and complaints are listed below: Patient was accompanied by daughter today. He had a fall 2 weeks ago.  Today he reports right hip pain after mechanical fall. Starting approximately 4 to 6 weeks ago, patient started to have decreased appetite, unintentional weight loss-14 pounds since last visit. Denies any chest pain, shortness of breath. Review of Systems  Constitutional: Positive for fatigue. Negative for appetite change, chills, fever and unexpected weight change.  HENT:   Negative for hearing loss and voice change.   Eyes: Negative for eye problems and icterus.  Respiratory:  Positive for shortness of breath. Negative for chest tightness and cough.   Cardiovascular: Negative for chest pain and leg swelling.  Gastrointestinal: Negative for abdominal distention and abdominal pain.  Endocrine: Negative for hot flashes.  Genitourinary: Negative for difficulty urinating, dysuria and frequency.   Musculoskeletal: Negative for arthralgias.  Skin: Negative for itching and rash.  Neurological: Negative for light-headedness and numbness.  Hematological: Negative for adenopathy. Does not bruise/bleed easily.  Psychiatric/Behavioral: Negative for confusion.    MEDICAL HISTORY:  Past Medical History:  Diagnosis Date  . 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 Hepatitis B     SURGICAL HISTORY: Past Surgical History:  Procedure Laterality Date  . 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 CLIPPING OF ATRIAL APPENDAGE Left 12/31/2019   Procedure: Clipping Of Atrial Appendage;  Surgeon: Wonda Olds, MD;  Location: [place];  Service: Open Heart Surgery;  Laterality: Left;  . CORONARY ARTERY BYPASS GRAFT N/A 12/31/2019   Procedure: CORONARY ARTERY BYPASS GRAFTING (CABG) TIMES THREE USING LEFT INTERNAL MAMMARY ARTERY AND LEFT GREATER SAPHENOUS LEG VEIN HARVESTED ENDOSCOPICALLY;  Surgeon: Wonda Olds, MD;  Location: [place];  Service: Open Heart Surgery;  Laterality: N/A;  POSSIBLE BILATERAL IMA  . CORONARY/GRAFT ACUTE MI REVASCULARIZATION N/A 12/31/2019   Procedure: Coronary/Graft Acute MI Revascularization;  Surgeon: Yolonda Kida, MD;  Location: [place] [facility];  Service: Cardiovascular;  Laterality: N/A;  . IR THORACENTESIS ASP PLEURAL SPACE W/IMG GUIDE  01/14/2020  . LEFT HEART CATH  AND CORONARY ANGIOGRAPHY N/A 12/31/2019   Procedure: LEFT HEART CATH AND CORONARY ANGIOGRAPHY;  Surgeon: Alwyn Pea, MD;  Location: [facility];  Service: Cardiovascular;  Laterality: N/A;  . MAZE N/A 12/31/2019   Procedure: MAZE;  Surgeon: Linden Dolin, MD;  Location: [facility];  Service: Open Heart Surgery;  Laterality: N/A;  . TEE WITHOUT CARDIOVERSION N/A 12/31/2019   Procedure: TRANSESOPHAGEAL ECHOCARDIOGRAM (TEE);  Surgeon: Linden Dolin, MD;  Location[facility];  Service: Open Heart Surgery;  Laterality: N/A;    SOCIAL HISTORY: Social History   Socioeconomic History  . Marital status: Widowed    Spouse name: Not on file  . Number of children: Not on file  . Years of education: Not on file  . Highest education level: Not on file  Occupational History  . Not on file  Tobacco Use  . Smoking status: Former Smoker    Packs/day: 0.50    Years: 40.00    Pack years: 20.00    Quit date: 1996    Years since quitting: 25.9  . Smokeless tobacco: Never Used  Vaping Use  . Vaping Use: Never used  Substance and Sexual Activity  . Alcohol use: Not Currently  . Drug use: Not Currently  . Sexual activity: Not Currently  Other Topics Concern  . Not on file  Social History Narrative  . Not on file   Social Determinants of Health   Financial Resource Strain: Low Risk   . Difficulty of Paying Living Expenses: Not hard at all  Food Insecurity: No Food Insecurity  . Worried About Programme researcher, broadcasting/film/video in the Last Year: Never true  . Ran Out of Food in the Last Year: Never true  Transportation Needs: No Transportation Needs  . Lack of Transportation (Medical): No  . Lack of Transportation (Non-Medical): No  Physical Activity: Sufficiently Active  . Days of Exercise per Week: 3 days  . Minutes of Exercise per Session: 60 min  Stress: No Stress Concern Present  . Feeling of Stress : Not at all  Social Connections: Unknown  . Frequency of Communication with Friends and Family: More than three times a week  . Frequency of Social Gatherings with Friends and Family: Three times a week  .  Attends Religious Services: Not on file  . Active Member of Clubs or Organizations: Not on file  . Attends Banker Meetings: Not on file  . Marital Status: Widowed  Intimate Partner Violence: Not At Risk  . Fear of Current or Ex-Partner: No  . Emotionally Abused: No  . Physically Abused: No  . Sexually Abused: No    FAMILY HISTORY: Family History  Problem Relation Age of Onset  . Cancer Father     ALLERGIES:  has No Known Allergies.  MEDICATIONS:  No current facility-administered medications for this visit.   Current Outpatient Medications  Medication Sig Dispense Refill  . apixaban (ELIQUIS) 2.5 MG TABS tablet Take 1 tablet (2.5 mg total) by mouth every 12 (twelve) hours. 180 tablet 3  . atorvastatin (LIPITOR) 80 MG tablet Take 1 tablet (80 mg total) by mouth daily at 6 PM. 90 tablet 3  . carvedilol (COREG) 6.25 MG tablet Take 1 tablet (6.25 mg total) by mouth 2 (two) times daily. 60 tablet 11  . megestrol (MEGACE) 20 MG tablet Take 1 tablet (20 mg total) by mouth daily. 30 tablet 4  . sacubitril-valsartan (ENTRESTO) 49-51 MG Take 1 tablet by mouth 2 (two)  times daily. 180 tablet 1  . spironolactone (ALDACTONE) 25 MG tablet Take 1 tablet (25 mg total) by mouth at bedtime. 90 tablet 3   Facility-Administered Medications Ordered in Other Visits  Medication Dose Route Frequency Provider Last Rate Last Admin  . 0.9 %  sodium chloride infusion   Intravenous Continuous Cox, Amy N, DO      . acetaminophen (TYLENOL) tablet 325 mg  325 mg Oral Q6H PRN Cox, Amy N, DO       Or  . acetaminophen (TYLENOL) suppository 325 mg  325 mg Rectal Q6H PRN Cox, Amy N, DO      . apixaban (ELIQUIS) tablet 2.5 mg  2.5 mg Oral Q12H Cox, Amy N, DO   2.5 mg at 11/14/2020 2158  . [START ON 11/05/2020] atorvastatin (LIPITOR) tablet 80 mg  80 mg Oral q1800 Cox, Amy N, DO      . [START ON 11/05/2020] megestrol (MEGACE) tablet 20 mg  20 mg Oral Daily Cox, Amy N, DO      . midodrine (PROAMATINE)  tablet 2.5 mg  2.5 mg Oral TID Cox, Amy N, DO      . ondansetron (ZOFRAN) tablet 4 mg  4 mg Oral Q6H PRN Cox, Amy N, DO       Or  . ondansetron (ZOFRAN) injection 4 mg  4 mg Intravenous Q6H PRN Cox, Amy N, DO         PHYSICAL EXAMINATION: ECOG PERFORMANCE STATUS: 2 - Symptomatic, <50% confined to bed Vitals:   11/30/2020 1302  BP: (!) 85/50  Pulse: 80  Resp: 18  Temp: 98.4 F (36.9 C)   Filed Weights   11/09/2020 1302  Weight: 101 lb 11.2 oz (46.1 kg)    Physical Exam Constitutional:      General: He is not in acute distress. HENT:     Head: Normocephalic and atraumatic.  Eyes:     General: No scleral icterus. Cardiovascular:     Rate and Rhythm: Normal rate and regular rhythm.     Heart sounds: Normal heart sounds.  Pulmonary:     Effort: Pulmonary effort is normal. No respiratory distress.     Breath sounds: No wheezing.  Abdominal:     General: Bowel sounds are normal. There is no distension.     Palpations: Abdomen is soft.  Musculoskeletal:        General: No deformity. Normal range of motion.     Cervical back: Normal range of motion and neck supple.  Skin:    General: Skin is warm and dry.     Findings: No erythema or rash.  Neurological:     Mental Status: He is alert and oriented to person, place, and time. Mental status is at baseline.     Cranial Nerves: No cranial nerve deficit.     Coordination: Coordination normal.  Psychiatric:        Mood and Affect: Mood normal.     LABORATORY DATA:  I have reviewed the data as listed Lab Results  Component Value Date   WBC 6.5 11/26/2020   HGB 8.6 (L) 11/10/2020   HCT 26.2 (L) 11/08/2020   MCV 109.2 (H) 11/17/2020   PLT 311 11/22/2020   Recent Labs    02/22/20 1042 05/10/20 1101 06/10/20 1158 06/16/20 1005 06/16/20 1005 06/30/20 1259 06/30/20 1259 08/15/20 1448 09/06/20 1213 09/16/20 1228 11/02/20 1131 11/14/2020 1358  NA 137   < > 141 140   < > 140   < >  141   < > 142 140 138  K 3.7   < > 4.5  4.5   < > 4.6   < > 4.4   < > 4.6 4.3 4.6  CL 105   < > 109 109   < > 112*   < > 108   < > 112* 105 104  CO2 26   < > 24 24   < > 19*   < > 25   < > 21* 26 25  GLUCOSE 112*   < > 100* 117*   < > 103*   < > 98   < > 110* 171* 124*  BUN 21   < > 20 27*   < > 23   < > 23   < > 25* 32* 28*  CREATININE 1.30   < > 1.04 1.26*   < > 1.11   < > 1.12   < > 1.27* 1.09 1.35*  CALCIUM 9.3   < > 9.5 9.6   < > 9.3   < > 9.3   < > 9.7 9.2 9.4  GFRNONAA  --    < > >60 53*   < > >60  --   --    < > 53* >60 53*  GFRAA  --   --  >60 >60  --  >60  --   --   --   --   --   --   PROT 7.2   < >  --  8.4*  --   --   --  6.9  --   --  6.9  --   ALBUMIN 3.5   < >  --  4.1  --   --   --  4.0  --   --  2.4*  --   AST 27   < >  --  32  --   --   --  21  --   --  67*  --   ALT 30   < >  --  27  --   --   --  20  --   --  103*  --   ALKPHOS 92   < >  --  79  --   --   --  70  --   --  75  --   BILITOT 1.4*   < >  --  1.5*  --   --   --  1.1  --   --  0.9  --   BILIDIR 0.3  --   --   --   --   --   --   --   --   --  0.2  --   IBILI  --   --   --   --   --   --   --   --   --   --  0.7  --    < > = values in this interval not displayed.   Iron/TIBC/Ferritin/ %Sat    Component Value Date/Time   IRON 103 05/30/2020 1037   TIBC 257 05/30/2020 1037   FERRITIN 413 (H) 05/30/2020 1037   IRONPCTSAT 40 05/30/2020 1037      RADIOGRAPHIC STUDIES: I have personally reviewed the radiological images as listed and agreed with the findings in the report. CT ABDOMEN PELVIS W CONTRAST  Result Date: 11/23/2020 CLINICAL DATA:  [place] 2 weeks ago, right lower back pain, hypotension EXAM:  CT ABDOMEN AND PELVIS WITH CONTRAST TECHNIQUE: Multidetector CT imaging of the abdomen and pelvis was performed using the standard protocol following bolus administration of intravenous contrast. CONTRAST:  50m OMNIPAQUE IOHEXOL 300 MG/ML  SOLN COMPARISON:  01/14/2020 FINDINGS: Lower chest: Emphysema, with bibasilar areas of scarring and fibrosis. No  acute pleural or parenchymal lung disease. Hepatobiliary: No focal liver abnormality is seen. No gallstones, gallbladder wall thickening, or biliary dilatation. Pancreas: Unremarkable. No pancreatic ductal dilatation or surrounding inflammatory changes. Spleen: Normal in size without focal abnormality. Adrenals/Urinary Tract: Adrenal glands are unremarkable. Kidneys are normal, without renal calculi, focal lesion, or hydronephrosis. Bladder is unremarkable. Stomach/Bowel: No bowel obstruction or ileus. Portions of the descending and sigmoid colon are contained within a left inguinal hernia, with no evidence of obstruction, incarceration, or bowel ischemia. Vascular/Lymphatic: Aortic atherosclerosis. No enlarged abdominal or pelvic lymph nodes. Reproductive: Prostate is unremarkable. Other: There is a small amount of free fluid within the lower pelvis, as well as within the left inguinal hernia. No free intraperitoneal gas. Musculoskeletal: No acute or destructive bony lesions. Reconstructed images demonstrate no additional findings. IMPRESSION: 1. Left inguinal hernia containing a portion of the distal descending and sigmoid colon. No bowel obstruction, incarceration, or strangulation. 2. Trace free fluid within the lower pelvis and left inguinal hernia sac. 3. Aortic Atherosclerosis (ICD10-I70.0) and Emphysema (ICD10-J43.9). Electronically Signed   By: MRanda NgoM.D.   On: 11/07/2020 21:59   DG Chest Portable 1 View  Result Date: 11/05/2020 CLINICAL DATA:  [place] ago, hypotension EXAM: PORTABLE CHEST 1 VIEW COMPARISON:  02/29/2020 FINDINGS: Single frontal view of the chest demonstrates postsurgical changes from CABG and aortic valve replacement. Cardiac silhouette is unremarkable. No airspace disease, effusion, or pneumothorax. No acute bony abnormalities. IMPRESSION: 1. No acute intrathoracic process. Electronically Signed   By: MRanda NgoM.D.   On: 11/17/2020 17:50   DG Hip Unilat W or Wo  Pelvis 2-3 Views Right  Result Date: 11/23/2020 CLINICAL DATA:  Right hip pain after fall 1 week ago EXAM: DG HIP (WITH OR WITHOUT PELVIS) 2-3V RIGHT COMPARISON:  None. FINDINGS: Frontal view of the pelvis as well as frontal and frogleg lateral views of the right hip are obtained. No fracture, subluxation, or dislocation. Mild symmetrical hip osteoarthritis. Moderate degenerative changes at the lumbosacral junction. IMPRESSION: 1. No acute bony abnormality. 2. Degenerative changes of the bilateral hips and lumbar spine. Electronically Signed   By: MRanda NgoM.D.   On: 11/25/2020 17:49      ASSESSMENT & PLAN:  1. Macrocytic anemia   2. Weight loss   3. Hypotension, unspecified hypotension type    #Macrocytic anemia,  Labs reviewed and discussed with patient. Acute drop of hemoglobin to 8.6, MCV increased to 109.2.-B12 and folate level has been previously checked both adequate level.  Discussed with patient that I suspect that he may have underlying bone marrow disorders, i.e. MDS.  Now that hemoglobin has acutely dropped which warrants bone marrow biopsy.  Patient is not enthusiastic about bone marrow biopsy.   However further discussed with him  #Blood pressure.  Patient is on diuretics and Coreg. Unknown etiology.  Likely secondary to hypovolemia due to decreased oral intake.  #Unintentional weight loss, progressive leg weakness.  In the context of hypotension recommend patient to go to emergency room for further evaluation and work-up.  He agrees with the plan.   All questions were answered. The patient knows to call the clinic with any problems questions or  concerns.  cc Marval Regal, NP    Return of visit: To be determined   Earlie Server, MD, PhD Hematology Oncology [facility] Pager- [phone] 11/18/2020 c

## 2020-11-04 NOTE — ED Provider Notes (Signed)
[facility] Emergency Department Provider Note    First MD Initiated Contact with Patient 11/14/2020 1636     (approximate)  I have reviewed the triage vital signs and the nursing notes.   HISTORY  Chief Complaint Hypotension    HPI Kartel Wolbert is a 79 y.o. male   the below listed past medical history presents to ER from outpatient hematology clinic for episode of low blood pressure.  Has had significant weight loss over the past several months.  Patient states that today is "the best I felt in weeks.  "States been struggling with right hip pain after mechanical fall and was feeling much better today.  Denies any bruising.  Denies any chest pain or shortness of breath.  States he does not feel weak or lightheaded.  Does endorse decreased p.o. intake just because he has not had much appetite.  States he otherwise feels well and was being seen in clinic for routine work-up.   Past Medical History:  Diagnosis Date  . 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 Hepatitis B    Family History  Problem Relation Age of Onset  . Cancer Father    Past Surgical History:  Procedure Laterality Date  . AORTIC VALVE REPLACEMENT N/A 12/31/2019   Procedure: AORTIC VALVE REPLACEMENT (AVR);  Surgeon: Wonda Olds, MD;  Location: [place];  Service: Open Heart Surgery;  Laterality: N/A;  . CLIPPING OF ATRIAL APPENDAGE Left 12/31/2019   Procedure: Clipping Of Atrial Appendage;  Surgeon: Wonda Olds, MD;  Location: [place];  Service: Open Heart Surgery;  Laterality: Left;  . CORONARY ARTERY BYPASS GRAFT N/A 12/31/2019   Procedure: CORONARY ARTERY BYPASS GRAFTING (CABG) TIMES THREE USING LEFT INTERNAL MAMMARY ARTERY AND LEFT GREATER SAPHENOUS LEG VEIN HARVESTED ENDOSCOPICALLY;  Surgeon: Wonda Olds, MD;  Location: [place];  Service: Open Heart Surgery;  Laterality: N/A;  POSSIBLE BILATERAL IMA  . CORONARY/GRAFT ACUTE MI REVASCULARIZATION N/A 12/31/2019    Procedure: Coronary/Graft Acute MI Revascularization;  Surgeon: Yolonda Kida, MD;  Location: [place] [facility];  Service: Cardiovascular;  Laterality: N/A;  . IR THORACENTESIS ASP PLEURAL SPACE W/IMG GUIDE  01/14/2020  . LEFT HEART CATH AND CORONARY ANGIOGRAPHY N/A 12/31/2019   Procedure: LEFT HEART CATH AND CORONARY ANGIOGRAPHY;  Surgeon: Yolonda Kida, MD;  Location: [place] [facility];  Service: Cardiovascular;  Laterality: N/A;  . MAZE N/A 12/31/2019   Procedure: MAZE;  Surgeon: Wonda Olds, MD;  Location: [place];  Service: Open Heart Surgery;  Laterality: N/A;  . TEE WITHOUT CARDIOVERSION N/A 12/31/2019   Procedure: TRANSESOPHAGEAL ECHOCARDIOGRAM (TEE);  Surgeon: Wonda Olds, MD;  Location: [place];  Service: Open Heart Surgery;  Laterality: N/A;   Patient Active Problem List   Diagnosis Date Noted  . Elevated liver enzymes 08/15/2020  . Weight loss 08/15/2020  . 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 Chronic anticoagulation 05/11/2020  . Preventative health care 05/10/2020  . Pleural effusion 01/30/2020  . CHF (congestive heart failure) ([place])   . Leukocytosis   . Anemia   . Acute systolic congestive heart failure ([place])   . AKI (acute kidney injury) ([place])   . SOB (shortness of breath)   . Acute on chronic systolic CHF (congestive heart failure) ([place])   . Debility 01/08/2020  . STEMI involving left main coronary artery ([place]) 12/31/2019  . STEMI (ST elevation myocardial infarction) ([place]) 12/31/2019  . 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 Authorizing Provider  apixaban (ELIQUIS) 2.5 MG TABS tablet Take 1 tablet (2.5 mg total) by mouth every 12 (twelve) hours. 02/25/20   Clegg, Amy D, NP  atorvastatin (LIPITOR) 80 MG tablet Take 1 tablet (80 mg total) by mouth daily at 6 PM. 02/25/20   Clegg, Amy D, NP  carvedilol (COREG) 6.25 MG tablet Take 1 tablet (6.25 mg total) by mouth 2 (two) times  daily. 08/22/20   Larey Dresser, MD  megestrol (MEGACE) 20 MG tablet Take 1 tablet (20 mg total) by mouth daily. 08/15/20   Marval Regal, NP  sacubitril-valsartan (ENTRESTO) 49-51 MG Take 1 tablet by mouth 2 (two) times daily. 09/08/20   Larey Dresser, MD  spironolactone (ALDACTONE) 25 MG tablet Take 1 tablet (25 mg total) by mouth at bedtime. 04/18/20   Darrick Grinder D, NP    Allergies Patient has no known allergies.    Social History Social History   Tobacco Use  . Smoking status: Former Smoker    Packs/day: 0.50    Years: 40.00    Pack years: 20.00    Quit date: 1996    Years since quitting: 25.9  . Smokeless tobacco: Never Used  Vaping Use  . Vaping Use: Never used  Substance Use Topics  . Alcohol use: Not Currently  . Drug use: Not Currently    Review of Systems Patient denies headaches, rhinorrhea, blurry vision, numbness, shortness of breath, chest pain, edema, cough, abdominal pain, nausea, vomiting, diarrhea, dysuria, fevers, rashes or hallucinations unless otherwise stated above in HPI. ____________________________________________   PHYSICAL EXAM:  VITAL SIGNS: Vitals:   11/17/2020 1353 11/11/2020 1630  BP:  118/68  Pulse:  73  Resp:  20  Temp: 98.2 F (36.8 C)   SpO2:  99%    Constitutional: Alert and oriented, frail appearing.  Eyes: Conjunctivae are normal.  Head: Atraumatic. Nose: No congestion/rhinnorhea. Mouth/Throat: Mucous membranes are moist.   Neck: No stridor. Painless ROM.  Cardiovascular: Normal rate, regular rhythm. Grossly normal heart sounds.  Good peripheral circulation. Respiratory: Normal respiratory effort.  No retractions. Lungs CTAB. Gastrointestinal: Soft and nontender, scaphoid. No distention. No abdominal bruits. No CVA tenderness. Genitourinary:  Musculoskeletal: No lower extremity tenderness nor edema.  No joint effusions. Neurologic:  Normal speech and language. No gross focal neurologic deficits are appreciated. No  facial droop Skin:  Skin is warm, dry and intact. No rash noted. Psychiatric: Mood and affect are normal. Speech and behavior are normal.  ____________________________________________   LABS (all labs ordered are listed, but only abnormal results are displayed)  Results for orders placed or performed during the hospital encounter of 11/30/2020 (from the past 24 hour(s))  Basic metabolic panel     Status: Abnormal   Collection Time: 11/22/2020  1:58 PM  Result Value Ref Range   Sodium 138 135 - 145 mmol/L   Potassium 4.6 3.5 - 5.1 mmol/L   Chloride 104 98 - 111 mmol/L   CO2 25 22 - 32 mmol/L   Glucose, Bld 124 (H) 70 - 99 mg/dL   BUN 28 (H) 8 - 23 mg/dL   Creatinine, Ser 1.35 (H) 0.61 - 1.24 mg/dL   Calcium 9.4 8.9 - 10.3 mg/dL   GFR, Estimated 53 (L) >60 mL/min   Anion gap 9 5 - 15  CBC     Status: Abnormal   Collection Time: 11/09/2020  1:58 PM  Result Value Ref  Range   WBC 6.5 4.0 - 10.5 K/uL   RBC 2.40 (L) 4.22 - 5.81 MIL/uL   Hemoglobin 8.6 (L) 13.0 - 17.0 g/dL   HCT 26.2 (L) 39 - 52 %   MCV 109.2 (H) 80.0 - 100.0 fL   MCH 35.8 (H) 26.0 - 34.0 pg   MCHC 32.8 30.0 - 36.0 g/dL   RDW 19.2 (H) 11.5 - 15.5 %   Platelets 311 150 - 400 K/uL   nRBC 0.0 0.0 - 0.2 %   ____________________________________________  EKG My review and personal interpretation at Time: 14:01   Indication: low bp  Rate: 70  Rhythm: sinus Axis: rightward Other: lbbb, lvh, no stemi, c/w previous tracing ____________________________________________  RADIOLOGY  I personally reviewed all radiographic images ordered to evaluate for the above acute complaints and reviewed radiology reports and findings.  These findings were personally discussed with the patient.  Please see medical record for radiology report.  ____________________________________________   PROCEDURES  Procedure(s) performed:  Procedures    Critical Care performed: no ____________________________________________   INITIAL  IMPRESSION / ASSESSMENT AND PLAN / ED COURSE  Pertinent labs & imaging results that were available during my care of the patient were reviewed by me and considered in my medical decision making (see chart for details).   DDX: Dehydration, electrolyte abnormality, acute blood loss anemia, deconditioning, failure to thrive  Shin Lamour is a 79 y.o. who presents to the ED with symptoms as described above.  Patient is frail elderly appearing.  Had measured low blood pressure in clinic today but is normal on arrival.  Do suspect some dehydration given his significant weight loss decreased p.o. intake.  Is not having any significant pain.  No shortness of breath.  Will check blood work give fluids and reassess  Clinical Course as of Nov 05 12  Fri Nov 04, 2020  1939 Patient's blood pressure still drops when he stands will give additional IV hydration.  He states he otherwise feels well but does agree with staying for additional fluid.    [PR]  2127 Patient still orthostatic with his systolics dropping down to 80s with ambulation does feel weak and lightheaded.  Will give additional IV hydration.   [PR]    Clinical Course User Index [PR] Merlyn Lot, MD  Patient still orthostatic with standing therefore CT imaging was ordered which does not show evidence of obstruction or mass.  No hematoma.  I do feel given his frailty and persistent hypotension despite IV fluids that will be in his best interest to discuss with hospitalist for admission for IV fluids and reassessment.  The patient was evaluated in Emergency Department today for the symptoms described in the history of present illness. He/she was evaluated in the context of the global COVID-19 pandemic, which necessitated consideration that the patient might be at risk for infection with the SARS-CoV-2 virus that causes COVID-19. Institutional protocols and algorithms that pertain to the evaluation of patients at risk for COVID-19 are in  a state of rapid change based on information released by regulatory bodies including the CDC and federal and state organizations. These policies and algorithms were followed during the patient's care in the ED.  As part of my medical decision making, I reviewed the following data within the [facility] notes reviewed and incorporated, Labs reviewed, notes from prior ED visits and [place] Controlled Substance Database   ____________________________________________   FINAL CLINICAL IMPRESSION(S) / ED DIAGNOSES  Final diagnoses:  Orthostasis  NEW MEDICATIONS STARTED DURING THIS VISIT:  New Prescriptions   No medications on file     Note:  This document was prepared using Dragon voice recognition software and may include unintentional dictation errors.    Merlyn Lot, MD 11/05/20 [phone]

## 2020-11-04 NOTE — ED Notes (Signed)
Pt a XRAY

## 2020-11-04 NOTE — ED Triage Notes (Signed)
[place] about two weeks ago, states injured right lower back.  Seen through Oncology today and BP was low and patient reports a 14 lb weight loss over past 6 weeks.

## 2020-11-04 NOTE — ED Notes (Signed)
Pt resting comfortably at this time. Pt keeps bending arm making IV pump alarm, pt educated an offered to put a blanket under his elbow to help assist with IVF infusion, pt refused this at this time.

## 2020-11-04 NOTE — ED Notes (Signed)
Pt refusing assistance with standing and  ambulating. Pt states "I have been independent my whole life, I dont want your help now." Pt educated on hypotensive s/s. PT given 4 pt walker and informed to call out if they need assistance.

## 2020-11-04 NOTE — ED Notes (Signed)
Patient assisted with urinal use to void. Peri care provided and clean brief applied. Patient positioned back in bed for comfort with cardiac monitor in place. Bed low and locked in reverse trendelenburg with knees elevated. Side rails raised x2. Call bell in reach. Patient provided with warm blanket and tv remote per request. Patient breathing easy and unlabored speaking in full sentences. Denies pain or other needs at this time.

## 2020-11-04 NOTE — Progress Notes (Signed)
Patient has a decrease in appetite and has a wt loss of 14 lbs.  Reports a fall 2 weeks without without injury.

## 2020-11-05 ENCOUNTER — Inpatient Hospital Stay: Payer: Medicare Other

## 2020-11-05 DIAGNOSIS — J9601 Acute respiratory failure with hypoxia: Secondary | ICD-10-CM | POA: Diagnosis not present

## 2020-11-05 DIAGNOSIS — J690 Pneumonitis due to inhalation of food and vomit: Secondary | ICD-10-CM | POA: Diagnosis not present

## 2020-11-05 DIAGNOSIS — I6340 Cerebral infarction due to embolism of unspecified cerebral artery: Secondary | ICD-10-CM | POA: Diagnosis not present

## 2020-11-05 DIAGNOSIS — D638 Anemia in other chronic diseases classified elsewhere: Secondary | ICD-10-CM | POA: Diagnosis not present

## 2020-11-05 DIAGNOSIS — I5022 Chronic systolic (congestive) heart failure: Secondary | ICD-10-CM | POA: Diagnosis not present

## 2020-11-05 DIAGNOSIS — I2510 Atherosclerotic heart disease of native coronary artery without angina pectoris: Secondary | ICD-10-CM | POA: Diagnosis present

## 2020-11-05 DIAGNOSIS — R5381 Other malaise: Secondary | ICD-10-CM | POA: Diagnosis present

## 2020-11-05 DIAGNOSIS — E861 Hypovolemia: Secondary | ICD-10-CM | POA: Diagnosis present

## 2020-11-05 DIAGNOSIS — N179 Acute kidney failure, unspecified: Secondary | ICD-10-CM | POA: Diagnosis present

## 2020-11-05 DIAGNOSIS — I110 Hypertensive heart disease with heart failure: Secondary | ICD-10-CM | POA: Diagnosis present

## 2020-11-05 DIAGNOSIS — R6520 Severe sepsis without septic shock: Secondary | ICD-10-CM | POA: Diagnosis not present

## 2020-11-05 DIAGNOSIS — I5023 Acute on chronic systolic (congestive) heart failure: Secondary | ICD-10-CM | POA: Diagnosis present

## 2020-11-05 DIAGNOSIS — E860 Dehydration: Secondary | ICD-10-CM | POA: Diagnosis present

## 2020-11-05 DIAGNOSIS — I9589 Other hypotension: Secondary | ICD-10-CM | POA: Diagnosis present

## 2020-11-05 DIAGNOSIS — Z20822 Contact with and (suspected) exposure to covid-19: Secondary | ICD-10-CM | POA: Diagnosis present

## 2020-11-05 DIAGNOSIS — I480 Paroxysmal atrial fibrillation: Secondary | ICD-10-CM | POA: Diagnosis not present

## 2020-11-05 DIAGNOSIS — Z515 Encounter for palliative care: Secondary | ICD-10-CM | POA: Diagnosis not present

## 2020-11-05 DIAGNOSIS — D469 Myelodysplastic syndrome, unspecified: Secondary | ICD-10-CM | POA: Diagnosis present

## 2020-11-05 DIAGNOSIS — E43 Unspecified severe protein-calorie malnutrition: Secondary | ICD-10-CM | POA: Diagnosis present

## 2020-11-05 DIAGNOSIS — F0390 Unspecified dementia without behavioral disturbance: Secondary | ICD-10-CM | POA: Diagnosis present

## 2020-11-05 DIAGNOSIS — I951 Orthostatic hypotension: Secondary | ICD-10-CM | POA: Diagnosis present

## 2020-11-05 DIAGNOSIS — R627 Adult failure to thrive: Secondary | ICD-10-CM

## 2020-11-05 DIAGNOSIS — I639 Cerebral infarction, unspecified: Secondary | ICD-10-CM | POA: Diagnosis not present

## 2020-11-05 DIAGNOSIS — A419 Sepsis, unspecified organism: Secondary | ICD-10-CM | POA: Diagnosis not present

## 2020-11-05 DIAGNOSIS — I452 Bifascicular block: Secondary | ICD-10-CM | POA: Diagnosis present

## 2020-11-05 DIAGNOSIS — I959 Hypotension, unspecified: Secondary | ICD-10-CM | POA: Diagnosis present

## 2020-11-05 DIAGNOSIS — G9341 Metabolic encephalopathy: Secondary | ICD-10-CM | POA: Diagnosis not present

## 2020-11-05 DIAGNOSIS — Z681 Body mass index (BMI) 19 or less, adult: Secondary | ICD-10-CM | POA: Diagnosis not present

## 2020-11-05 DIAGNOSIS — R64 Cachexia: Secondary | ICD-10-CM | POA: Diagnosis present

## 2020-11-05 DIAGNOSIS — I248 Other forms of acute ischemic heart disease: Secondary | ICD-10-CM | POA: Diagnosis not present

## 2020-11-05 DIAGNOSIS — Z66 Do not resuscitate: Secondary | ICD-10-CM | POA: Diagnosis present

## 2020-11-05 LAB — IRON AND TIBC
Iron: 27 ug/dL — ABNORMAL LOW (ref 45–182)
Saturation Ratios: 18 % (ref 17.9–39.5)
TIBC: 151 ug/dL — ABNORMAL LOW (ref 250–450)
UIBC: 124 ug/dL

## 2020-11-05 LAB — CBC
HCT: 23.1 % — ABNORMAL LOW (ref 39.0–52.0)
Hemoglobin: 7.7 g/dL — ABNORMAL LOW (ref 13.0–17.0)
MCH: 36.5 pg — ABNORMAL HIGH (ref 26.0–34.0)
MCHC: 33.3 g/dL (ref 30.0–36.0)
MCV: 109.5 fL — ABNORMAL HIGH (ref 80.0–100.0)
Platelets: 275 10*3/uL (ref 150–400)
RBC: 2.11 MIL/uL — ABNORMAL LOW (ref 4.22–5.81)
RDW: 19 % — ABNORMAL HIGH (ref 11.5–15.5)
WBC: 6.8 10*3/uL (ref 4.0–10.5)
nRBC: 0 % (ref 0.0–0.2)

## 2020-11-05 LAB — BASIC METABOLIC PANEL
Anion gap: 6 (ref 5–15)
BUN: 22 mg/dL (ref 8–23)
CO2: 22 mmol/L (ref 22–32)
Calcium: 7.7 mg/dL — ABNORMAL LOW (ref 8.9–10.3)
Chloride: 110 mmol/L (ref 98–111)
Creatinine, Ser: 1 mg/dL (ref 0.61–1.24)
GFR, Estimated: >60 mL/min (ref >60)
Glucose, Bld: 94 mg/dL (ref 70–99)
Potassium: 3.7 mmol/L (ref 3.5–5.1)
Sodium: 138 mmol/L (ref 135–145)

## 2020-11-05 LAB — GLUCOSE, CAPILLARY: Glucose-Capillary: 150 mg/dL — ABNORMAL HIGH (ref 70–99)

## 2020-11-05 MED ORDER — MIDODRINE HCL 5 MG PO TABS
2.5000 mg | ORAL_TABLET | Freq: Three times a day (TID) | ORAL | Status: DC
  Administered 2020-11-05 – 2020-11-06 (×2): 2.5 mg via ORAL
  Filled 2020-11-05 (×6): qty 0.5

## 2020-11-05 NOTE — ED Notes (Signed)
Introduced self to pt. Pt denies any needs except is cold. Helped to cover back up.

## 2020-11-05 NOTE — Progress Notes (Signed)
PROGRESS NOTE    David Perez  DDU:[id number] DOB: 04-22-1941 DOA: 11/23/2020 PCP: Marval Regal, NP   Chief complaint.  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  He was sent from hematology clinic with hypotension. Discussed with patient daughter, patient had a fall about 5 days ago.  He has generalized weakness.  He occasionally confused.  He has a poor p.o. intake, he has lost 20 pounds of body weight.  He also has some loose stools.  He currently lives alone at home. When I see the patient today, he appeared to have some confusion.  Speech is slurred.  But no weakness in the legs or arms.  No facial droop. He had a CT scan of abdomen/pelvis in the emergency room, did not see any acute changes.  Hb7.7.   Assessment & Plan:   Principal Problem:   Hypotension due to hypovolemia Active Problems:   S/P CABG x 3   Debility   Macrocytic anemia   AKI (acute kidney injury) (HCC)   Chronic anticoagulation   Weight loss   Protein malnutrition (HCC)   Hypotension   #1.  Hypotension. Multifactorial.  Secondary to dehydration from poor p.o. intake having diarrhea, heart medicines. Patient received IV fluid in the emergency room, blood pressure is better. Will hold Entresto.  May be able to start with a blocker again tomorrow.  2.  Altered mental status with slurred speech. Daughter states that this has been happening on and off for quite a few days.  Will obtain CT scan of the head, also obtain MRI of the brain. Patient may also has developed some dementia.  #3.  Failure to thrive. Patient appeared to have significant weakness.  He also has a poor appetite and weight loss. We will continue megestrol. Start physical therapy/Occupational Therapy.  #4.  Anemia. Appear to be macrocytic.  We will check iron B12 level.  Also check reticulocyte  count. Recheck a CBC tomorrow, transfuse as needed. Also check stool for occult blood, daughter does not know if patient has black stool overnight.  #5.  Acute kidney injury. Renal function normalized after fluids.  #6. chronic systolic congestive heart failure. No exacerbation.  7.  Chronic anticoagulation. Reviewed multiple EKGs in the past, sinus with RBBB.  Did not see atrial fibrillation.  Did not mention atrial fibrillation in history.  8.  CODE STATUS. Patient is DO NOT RESUSCITATE status per patient daughter.  Patient does not remember due to poor memory  DVT prophylaxis: Eliquis Code Status: DNR Family Communication: Daughter updated. Disposition Plan:  .   Status is: Inpatient  Remains inpatient appropriate because:Inpatient level of care appropriate due to severity of illness   Dispo: The patient is from: Home              Anticipated d/c is to: ?              Anticipated d/c date is: ?              Patient currently is not medically stable to d/c.        I/O last 3 completed shifts: In: -  Out: 100 [Urine:100] No intake/output data recorded.     Consultants:   None  Procedures: None  Antimicrobials: None  Subjective: Patient has very poor memory, speech appeared to be slurred.  Otherwise, he denies any short of breath or cough. No abdominal pain  or nausea vomiting. No fever or chills. No dysuria hematuria. Denies any headache or dizziness  Objective: Vitals:   11/05/20 0900 11/05/20 0930 11/05/20 1300 11/05/20 1330  BP: 131/69 113/62 (!) 95/52 (!) 115/51  Pulse: 86 82 76 65  Resp: (!) 21 (!) 27 (!) 24 (!) 22  Temp:   98.2 F (36.8 C)   TempSrc:   Oral   SpO2: 97% 95% 98% 96%  Weight:      Height:        Intake/Output Summary (Last 24 hours) at 11/05/2020 1431 Last data filed at 11/29/2020 2354 Gross per 24 hour  Intake --  Output 100 ml  Net -100 ml   Filed Weights   11/30/2020 1350  Weight: 45.8 kg     Examination:  General exam: Appears calm and comfortable  Respiratory system: Clear to auscultation. Respiratory effort normal. Cardiovascular system: S1 & S2 heard, RRR. No JVD, murmurs, rubs, gallops or clicks. No pedal edema. Gastrointestinal system: Abdomen is nondistended, soft and nontender. No organomegaly or masses felt. Normal bowel sounds heard. Central nervous system: Alert and oriented x1.  Difficulty with finding words. Extremities: Symmetric  Skin: No rashes, lesions or ulcers Psychiatry:  Mood & affect appropriate.     Data Reviewed: I have personally reviewed following labs and imaging studies  CBC: Recent Labs  Lab 11/02/20 1131 11/05/2020 1358 11/05/20 0553  WBC 8.4 6.5 6.8  NEUTROABS 7.0  --   --   HGB 8.1* 8.6* 7.7*  HCT 24.8* 26.2* 23.1*  MCV 109.7* 109.2* 109.5*  PLT 294 311 127   Basic Metabolic Panel: Recent Labs  Lab 11/02/20 1131 11/10/2020 1358 11/05/20 0553  NA 140 138 138  K 4.3 4.6 3.7  CL 105 104 110  CO2 26 25 22   GLUCOSE 171* 124* 94  BUN 32* 28* 22  CREATININE 1.09 1.35* 1.00  CALCIUM 9.2 9.4 7.7*   GFR: Estimated Creatinine Clearance: 37.5 mL/min (by C-G formula based on SCr of 1 mg/dL). Liver Function Tests: Recent Labs  Lab 11/02/20 1131  AST 67*  ALT 103*  ALKPHOS 75  BILITOT 0.9  PROT 6.9  ALBUMIN 2.4*   No results for input(s): LIPASE, AMYLASE in the last 168 hours. No results for input(s): AMMONIA in the last 168 hours. Coagulation Profile: No results for input(s): INR, PROTIME in the last 168 hours. Cardiac Enzymes: No results for input(s): CKTOTAL, CKMB, CKMBINDEX, TROPONINI in the last 168 hours. BNP (last 3 results) No results for input(s): PROBNP in the last 8760 hours. HbA1C: No results for input(s): HGBA1C in the last 72 hours. CBG: No results for input(s): GLUCAP in the last 168 hours. Lipid Profile: No results for input(s): CHOL, HDL, LDLCALC, TRIG, CHOLHDL, LDLDIRECT in the last 72  hours. Thyroid Function Tests: No results for input(s): TSH, T4TOTAL, FREET4, T3FREE, THYROIDAB in the last 72 hours. Anemia Panel: No results for input(s): VITAMINB12, FOLATE, FERRITIN, TIBC, IRON, RETICCTPCT in the last 72 hours. Sepsis Labs: No results for input(s): PROCALCITON, LATICACIDVEN in the last 168 hours.  Recent Results (from the past 240 hour(s))  Resp Panel by RT-PCR (Flu A&B, Covid) Nasopharyngeal Swab     Status: None   Collection Time: 11/26/2020 10:03 PM   Specimen: Nasopharyngeal Swab; Nasopharyngeal(NP) swabs in vial transport medium  Result Value Ref Range Status   SARS Coronavirus 2 by RT PCR NEGATIVE NEGATIVE Final    Comment: (NOTE) SARS-CoV-2 target nucleic acids are NOT DETECTED.  The SARS-CoV-2 RNA is generally  detectable in upper respiratory specimens during the acute phase of infection. The lowest concentration of SARS-CoV-2 viral copies this assay can detect is 138 copies/mL. A negative result does not preclude SARS-Cov-2 infection and should not be used as the sole basis for treatment or other patient management decisions. A negative result may occur with  improper specimen collection/handling, submission of specimen other than nasopharyngeal swab, presence of viral mutation(s) within the areas targeted by this assay, and inadequate number of viral copies(<138 copies/mL). A negative result must be combined with clinical observations, patient history, and epidemiological information. The expected result is Negative.  Fact Sheet for Patients:  EntrepreneurPulse.com.au  Fact Sheet for Healthcare Providers:  IncredibleEmployment.be  This test is no t yet approved or cleared by the [place] FDA and  has been authorized for detection and/or diagnosis of SARS-CoV-2 by FDA under an Emergency Use Authorization (EUA). This EUA will remain  in effect (meaning this test can be used) for the duration of the COVID-19  declaration under Section 564(b)(1) of the Act, 21 U.S.C.section 360bbb-3(b)(1), unless the authorization is terminated  or revoked sooner.       Influenza A by PCR NEGATIVE NEGATIVE Final   Influenza B by PCR NEGATIVE NEGATIVE Final    Comment: (NOTE) The Xpert Xpress SARS-CoV-2/FLU/RSV plus assay is intended as an aid in the diagnosis of influenza from Nasopharyngeal swab specimens and should not be used as a sole basis for treatment. Nasal washings and aspirates are unacceptable for Xpert Xpress SARS-CoV-2/FLU/RSV testing.  Fact Sheet for Patients: EntrepreneurPulse.com.au  Fact Sheet for Healthcare Providers: IncredibleEmployment.be  This test is not yet approved or cleared by the [place] FDA and has been authorized for detection and/or diagnosis of SARS-CoV-2 by FDA under an Emergency Use Authorization (EUA). This EUA will remain in effect (meaning this test can be used) for the duration of the COVID-19 declaration under Section 564(b)(1) of the Act, 21 U.S.C. section 360bbb-3(b)(1), unless the authorization is terminated or revoked.  Performed at [facility], [address] [place], [place] [address]          Radiology Studies: CT ABDOMEN PELVIS W CONTRAST  Result Date: 11/13/2020 CLINICAL DATA:  [place] 2 weeks ago, right lower back pain, hypotension EXAM: CT ABDOMEN AND PELVIS WITH CONTRAST TECHNIQUE: Multidetector CT imaging of the abdomen and pelvis was performed using the standard protocol following bolus administration of intravenous contrast. CONTRAST:  54mL OMNIPAQUE IOHEXOL 300 MG/ML  SOLN COMPARISON:  01/14/2020 FINDINGS: Lower chest: Emphysema, with bibasilar areas of scarring and fibrosis. No acute pleural or parenchymal lung disease. Hepatobiliary: No focal liver abnormality is seen. No gallstones, gallbladder wall thickening, or biliary dilatation. Pancreas: Unremarkable. No pancreatic ductal dilatation or  surrounding inflammatory changes. Spleen: Normal in size without focal abnormality. Adrenals/Urinary Tract: Adrenal glands are unremarkable. Kidneys are normal, without renal calculi, focal lesion, or hydronephrosis. Bladder is unremarkable. Stomach/Bowel: No bowel obstruction or ileus. Portions of the descending and sigmoid colon are contained within a left inguinal hernia, with no evidence of obstruction, incarceration, or bowel ischemia. Vascular/Lymphatic: Aortic atherosclerosis. No enlarged abdominal or pelvic lymph nodes. Reproductive: Prostate is unremarkable. Other: There is a small amount of free fluid within the lower pelvis, as well as within the left inguinal hernia. No free intraperitoneal gas. Musculoskeletal: No acute or destructive bony lesions. Reconstructed images demonstrate no additional findings. IMPRESSION: 1. Left inguinal hernia containing a portion of the distal descending and sigmoid colon. No bowel obstruction, incarceration, or strangulation. 2. Trace  free fluid within the lower pelvis and left inguinal hernia sac. 3. Aortic Atherosclerosis (ICD10-I70.0) and Emphysema (ICD10-J43.9). Electronically Signed   By: Randa Ngo M.D.   On: 11/13/2020 21:59   DG Chest Portable 1 View  Result Date: 11/05/2020 CLINICAL DATA:  [place] 1 week ago, hypotension EXAM: PORTABLE CHEST 1 VIEW COMPARISON:  02/29/2020 FINDINGS: Single frontal view of the chest demonstrates postsurgical changes from CABG and aortic valve replacement. Cardiac silhouette is unremarkable. No airspace disease, effusion, or pneumothorax. No acute bony abnormalities. IMPRESSION: 1. No acute intrathoracic process. Electronically Signed   By: Randa Ngo M.D.   On: 11/16/2020 17:50   DG Hip Unilat W or Wo Pelvis 2-3 Views Right  Result Date: 11/14/2020 CLINICAL DATA:  Right hip pain after fall 1 week ago EXAM: DG HIP (WITH OR WITHOUT PELVIS) 2-3V RIGHT COMPARISON:  None. FINDINGS: Frontal view of the pelvis as well as  frontal and frogleg lateral views of the right hip are obtained. No fracture, subluxation, or dislocation. Mild symmetrical hip osteoarthritis. Moderate degenerative changes at the lumbosacral junction. IMPRESSION: 1. No acute bony abnormality. 2. Degenerative changes of the bilateral hips and lumbar spine. Electronically Signed   By: Randa Ngo M.D.   On: 11/03/2020 17:49        Scheduled Meds: . apixaban  2.5 mg Oral Q12H  . atorvastatin  80 mg Oral q1800  . megestrol  20 mg Oral Daily  . midodrine  2.5 mg Oral TID WC   Continuous Infusions: . sodium chloride 125 mL/hr at 11/05/20 0111     LOS: 0 days    Time spent: 40 minutes.  More than 50% time on direct patient care    Sharen Hones, MD [facility]   To contact the attending provider between 7A-7P or the covering provider during after hours 7P-7A, please log into the web site [email or web address] and access using universal [place] password for that web site. If you do not have the password, please call the hospital operator.  11/05/2020, 2:31 PM

## 2020-11-05 NOTE — ED Notes (Signed)
Pt given meal tray and tray set up for patient. Able to feed self independently.

## 2020-11-05 NOTE — ED Notes (Signed)
Pt transported to MRI.  Advised MRI that pt has assigned room, already cleared with floor.  MRI states they should be able to have pt go straight to room from MRI

## 2020-11-05 NOTE — ED Notes (Signed)
Assisted pt with urinal

## 2020-11-06 ENCOUNTER — Inpatient Hospital Stay: Payer: Medicare Other

## 2020-11-06 ENCOUNTER — Encounter: Payer: Self-pay | Admitting: Internal Medicine

## 2020-11-06 DIAGNOSIS — D638 Anemia in other chronic diseases classified elsewhere: Secondary | ICD-10-CM

## 2020-11-06 DIAGNOSIS — I272 Pulmonary hypertension, unspecified: Secondary | ICD-10-CM | POA: Diagnosis present

## 2020-11-06 DIAGNOSIS — I639 Cerebral infarction, unspecified: Secondary | ICD-10-CM

## 2020-11-06 DIAGNOSIS — E43 Unspecified severe protein-calorie malnutrition: Secondary | ICD-10-CM

## 2020-11-06 DIAGNOSIS — I480 Paroxysmal atrial fibrillation: Secondary | ICD-10-CM

## 2020-11-06 DIAGNOSIS — J9601 Acute respiratory failure with hypoxia: Secondary | ICD-10-CM

## 2020-11-06 DIAGNOSIS — I5022 Chronic systolic (congestive) heart failure: Secondary | ICD-10-CM

## 2020-11-06 LAB — CBC WITH DIFFERENTIAL/PLATELET
Abs Immature Granulocytes: 0.05 10*3/uL (ref 0.00–0.07)
Basophils Absolute: 0 10*3/uL (ref 0.0–0.1)
Basophils Relative: 0 %
Eosinophils Absolute: 0 10*3/uL (ref 0.0–0.5)
Eosinophils Relative: 0 %
HCT: 24.3 % — ABNORMAL LOW (ref 39.0–52.0)
Hemoglobin: 8.2 g/dL — ABNORMAL LOW (ref 13.0–17.0)
Immature Granulocytes: 1 %
Lymphocytes Relative: 12 %
Lymphs Abs: 1.1 10*3/uL (ref 0.7–4.0)
MCH: 35.7 pg — ABNORMAL HIGH (ref 26.0–34.0)
MCHC: 33.7 g/dL (ref 30.0–36.0)
MCV: 105.7 fL — ABNORMAL HIGH (ref 80.0–100.0)
Monocytes Absolute: 0.5 10*3/uL (ref 0.1–1.0)
Monocytes Relative: 6 %
Neutro Abs: 7.3 10*3/uL (ref 1.7–7.7)
Neutrophils Relative %: 81 %
Platelets: 278 10*3/uL (ref 150–400)
RBC: 2.3 MIL/uL — ABNORMAL LOW (ref 4.22–5.81)
RDW: 18.7 % — ABNORMAL HIGH (ref 11.5–15.5)
WBC: 9 10*3/uL (ref 4.0–10.5)
nRBC: 0.2 % (ref 0.0–0.2)

## 2020-11-06 LAB — FIBRIN DERIVATIVES D-DIMER (ARMC ONLY): Fibrin derivatives D-dimer (ARMC): 1296.49 ng/mL (FEU) — ABNORMAL HIGH (ref 0.00–499.00)

## 2020-11-06 LAB — TSH: TSH: 3.289 u[IU]/mL (ref 0.350–4.500)

## 2020-11-06 LAB — VITAMIN B12: Vitamin B-12: 906 pg/mL (ref 180–914)

## 2020-11-06 MED ORDER — IOHEXOL 350 MG/ML SOLN
75.0000 mL | Freq: Once | INTRAVENOUS | Status: AC | PRN
  Administered 2020-11-06: 75 mL via INTRAVENOUS

## 2020-11-06 MED ORDER — BOOST / RESOURCE BREEZE PO LIQD CUSTOM
1.0000 | ORAL | Status: DC
  Administered 2020-11-06 – 2020-11-08 (×2): 1 via ORAL

## 2020-11-06 MED ORDER — HALOPERIDOL LACTATE 5 MG/ML IJ SOLN
5.0000 mg | Freq: Once | INTRAMUSCULAR | Status: AC
  Administered 2020-11-06: 5 mg via INTRAVENOUS
  Filled 2020-11-06: qty 1

## 2020-11-06 MED ORDER — MIDODRINE HCL 5 MG PO TABS
5.0000 mg | ORAL_TABLET | Freq: Three times a day (TID) | ORAL | Status: DC
  Filled 2020-11-06 (×3): qty 1

## 2020-11-06 MED ORDER — ENSURE ENLIVE PO LIQD
237.0000 mL | Freq: Two times a day (BID) | ORAL | Status: DC
  Administered 2020-11-07 – 2020-11-08 (×2): 237 mL via ORAL

## 2020-11-06 NOTE — Progress Notes (Signed)
Np notified of CT angio repot. See new orders.

## 2020-11-06 NOTE — Progress Notes (Signed)
Initial Nutrition Assessment  RD working remotely.  DOCUMENTATION CODES:   Underweight  INTERVENTION:  - liberalize diet from Heart Healthy to Regular--ok with MD. - will order Boost Breeze once/day, each supplement provides 250 kcal and 9 grams of protein. - will order Ensure Enlive BID, each supplement provides 350 kcal and 20 grams of protein. - will order Magic Cup BID with meals, each supplement provides 290 kcal and 9 grams of protein. - complete NFPE at follow-up.    NUTRITION DIAGNOSIS:   Increased nutrient needs related to acute illness as evidenced by estimated needs.  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 He was sent from hematology clinic to the ED due to hypotension. His daughter reported that he had a fall ~5 days PTA and that he has been experiencing generalized weakness, occasional confusion, poor appetite and intakes, and that he has lost ~20 lb. Patient lives alone. CT abdomen/pelvis in the ED did not show any acute changes.  Patient noted to be a/o to self only; did not attempt to call patient in light of this.   No intakes documented since admission.   Weight on date of admission of 12/3 was 101 lb and weight on 10/5 was 115 lb. This indicates 14 lb weight loss (12.2% body weight) in the past 2 months; significant for time frame.   Suspect some degree of malnutrition but unable to confirm without completing NFPE.   He has not been seen by a [place] RD since 01/07/20.   Per notes: - hypotension 2/2 dehydration d/t poor PO intakes and diarrhea - AMS with slurred speech--pending CT head and MRI brain - FTT with apparent significant weakness, poor appetite,  and weight loss - anemia - AKI   Labs reviewed; Ca: 7.7 mg/dl. Medications reviewed; 20 mg megace/day started 12/4.     NUTRITION - FOCUSED PHYSICAL EXAM:  unable to complete at this time.   Diet Order:   Diet Order            Diet regular Room service appropriate? Yes; Fluid consistency: Thin  Diet effective now                 EDUCATION NEEDS:   Not appropriate for education at this time  Skin:  Skin Assessment: Reviewed RN Assessment  Last BM:  12/3 (patient reported)  Height:   Ht Readings from Last 1 Encounters:  11/11/2020 5\' 7"  (1.702 m)    Weight:   Wt Readings from Last 1 Encounters:  11/30/2020 45.8 kg    Estimated Nutritional Needs:  Kcal:  1465-1650 kcal Protein:  73-88 grams Fluid:  >/= 1.7 L/day      David Matin, MS, RD, LDN, CNSC Inpatient Clinical Dietitian RD pager # available in AMION  After hours/weekend pager # available in [facility]

## 2020-11-06 NOTE — Progress Notes (Addendum)
[employer] Nurse notified me regarding a "yellow MEWS secondary to increased heart and respiratory rate.  Review of chart shows fluctuating tachycardia and respiratory rates above 20  Review shows patient has been admitted from  hematologists office with reports of   hypotension, increasing SOB/fatigue, diffuse weakness and 14 lb weight loss over the past 6 weeks, and acute drop of hemoglobin.  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  He has not had any focal deficits .  Stroke did occur in the setting of being on low dose eliquis for paroxysmal atrial fib  Patent tellls me his shortness of breath started a couple of weeks.  Also endorses fall a  week ago in which he did not sustain any injury but did experience some right lower back/hip discomfort.  Denies effect to mobility at the at time.  Patient does not have any lower extremity edema, redness or or pain..  Given stroke occurred whil on eliquis, there was concern tachycardia and shortness of breath that have not improved could be pulmonary embolism.  D dimer significantly elevate CTA chest done  EXAM: CT ANGIOGRAPHY CHEST WITH CONTRAST  TECHNIQUE: Multidetector CT imaging of the chest was performed using the standard protocol during bolus administration of intravenous contrast. Multiplanar CT image reconstructions and MIPs were obtained to evaluate the vascular anatomy.  CONTRAST:  42mL OMNIPAQUE IOHEXOL 350 MG/ML SOLN  COMPARISON:  Radiograph 11/13/2020, CT 12/30/2018  FINDINGS: Cardiovascular: Satisfactory opacification the pulmonary arteries to the segmental level. No pulmonary artery filling defects are identified. Central pulmonary arteries are normal caliber. Borderline cardiomegaly. Calcification of the native coronaries. 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  No pericardial effusion. Atherosclerotic plaque within the normal caliber aorta. No visible acute luminal abnormality of the imaged aorta. No periaortic stranding or hemorrhage. Reflux of contrast into the hepatic veins and IVC.  Mediastinum/Nodes: Scattered mediastinal and hilar nodes are present, several increasing slightly in prominence from comparison studies, largest include a left paratracheal node measuring 11 mm (5/35) and an 11 mm subcarinal node (5/46). Worrisome axillary adenopathy. No acute abnormality of the thoracic esophagus. Coronal narrowing of the trachea may reflect some chronic obstructive pulmonary lung disease. No other acute abnormality is seen.  Lungs/Pleura: Diffuse centrilobular emphysema. Interlobular septal thickening is present as well as some developing hazy ground-glass and more consolidative opacity in mid to lower lung predominance. Vascular redistribution is seen. A more focal region of opacity with some adjacent pleural tenting/architectural distortion is seen in the lateral aspect of the left upper lobe/superior lingula (5/47) small bilateral effusions with adjacent passive atelectasis. A similar area is also present in the lateral segment left lower lobe as well (7/74). No pneumothorax.  Upper Abdomen: Atherosclerotic calcifications in the upper abdomen. Reflux of contrast into the hepatic veins and IVC, as above. No acute abnormalities present in the visualized portions of the upper abdomen.  Musculoskeletal: The osseous structures appear diffusely demineralized which may limit detection of small or nondisplaced fractures. Exaggerated thoracic kyphosis is similar to prior with some chronic likely degenerative vertebral body height loss. Increased diameter of chest is also unchanged. Prior sternotomy without acute complication. Paucity of subcutaneous fat. Mild diffuse body wall edema. No focal chest wall lesions.  Review of the MIP  images confirms the above findings.  IMPRESSION: 1. No evidence of acute pulmonary artery embolism. 2. Extensive severe centrilobular emphysema.  3. Interspersed areas of ground-glass opacity, septal thickening and vascular redistribution suggest developing CHF/volume overload with pulmonary edema. 4. More focal opacities in the periphery of the left upper lobe/lingula and left lower lobe, could reflect regions of scarring and architectural distortion with atelectatic collapse and edema though underlying airspace disease/infection not excluded. Furthermore, should pursue follow-up imaging post therapeutic intervention to ensure resolution. 5. Scattered mediastinal and hilar nodes, several increasing slightly in prominence from comparison studies, possibly reactive. 6. 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 7. Aortic Atherosclerosis (ICD10-I70.0).  L;ast echp - 06/10/2020  1. Left ventricular ejection fraction, by estimation, is 45 to 50%. The  left ventricle has mildly decreased function. The left ventricle  demonstrates regional wall motion abnormalities, mid anteroseptal/apical  septal severe hypokinesis. There is mild  left ventricular hypertrophy. Left ventricular diastolic parameters are  consistent with Grade II diastolic dysfunction (pseudonormalization).  2. Right ventricular systolic function is normal. The right ventricular  size is normal. There is mildly elevated pulmonary artery systolic  pressure. The estimated right ventricular systolic pressure is 85.8 mmHg.  3. Left atrial size was mildly dilated.  4. Right atrial size was mildly dilated.  5. The mitral valve is degenerative. Mild mitral valve regurgitation. No  evidence of mitral stenosis.  6. Tricuspid valve regurgitation is mild to moderate.  7. Bioprosthetic aortic valve functions normally. Aortic valve  regurgitation is not visualized. No aortic stenosis is  present. Aortic  valve mean gradient measures 13.0 mmHg.  8. The inferior vena cava is normal in size with greater than 50%  respiratory variability, suggesting right atrial pressure of 3 mmHg.   Given his continued hypotension despite fluid volume resucitaion on admit, diuresis will be difficult and unable to tolerate beta blocker therapy. He was started on midodrine for BP support. His TSH normal range and appears cortisol workup planned for am   Hemoglobin is improved above 8 but baseline 5 months ago at 11 so there could be component of symptomatic anemia to symptoms.  Monitor and given cardiac history with symptoms, HGB should be kept above 8.0    Troponin at 10 on admission without trending.  Will recheck.   Given CT findings Need BNP and also procalcitonin to assist in ruling out acut infectious process involved in changes of left lung and enlarged lymph nodes  Critical troponin now at 215 - trend - likely demand ischemia BNP - over 3000 - give albumin followed by lasix secondary to soft pressures Given sequelae of events leading to admission in patient  with knownsystolic heart failure, and increasing oxygen demand -  repeat echo is warranted  In addition to cardiology consult/follow up, patent will need repeat CT when euvolemia obtained  Continued increase oxygen demad and work of breathing requiring bipap - Elevated white count and oprocalcitonin Added - blood cultures , lactic acid rocephin, doxycyclin, steroids and transfer to progressive

## 2020-11-06 NOTE — Evaluation (Signed)
Occupational Therapy Evaluation Patient Details Name: David Perez MRN: [id number] DOB: [birth year] Today's Date: 11/06/2020    History of Present Illness [name] is a 79 y.o. 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   Clinical Impression   [name] was seen for OT/PT co-evaluation this date - seen together 2/2 behaviour/cognition. Prior to hospital admission, pt was Independent for mobility and ADLs including driving. Pt lives alone c 2 daughters nearby. Pt is adamant that he was/is not confused and contradict what he was previously noted in chart to have told MD (ie denies poor intake and denies he stopped going to gym). Pt presents to acute OT demonstrating impaired ADL performance and functional mobility 2/2 decreased safety awareness, functional strength/endurance deficits, and poor insight into deficits.   Pt currently requires  MIN VCs self-feeding at bed level - assist for spillage. SUPERVISION don B socks seated EOB. CGA for ADL t/f. SpO2 80% t/o session on 3.5-6L [place] (no change c PLB, rest, or increased O2 - unclear if poor pulse ox reading vs mouth breathing). RN in room and aware. Pt would benefit from skilled OT to address noted impairments and functional limitations (see below for any additional details) in order to maximize safety and independence while minimizing falls risk and caregiver burden. Upon hospital discharge, recommend HHOT to maximize pt safety and return to functional independence during meaningful occupations of daily life.     Follow Up Recommendations  Home health  OT;Supervision/Assistance - 24 hour    Equipment Recommendations  3 in 1 bedside commode    Recommendations for Other Services       Precautions / Restrictions Precautions Precautions: Fall Restrictions Weight Bearing Restrictions: No      Mobility Bed Mobility Overal bed mobility: Modified Independent       Transfers Overall transfer level: Needs assistance Equipment used: None Transfers: Sit to/from Stand Sit to Stand: Supervision         Balance Overall balance assessment: Needs assistance Sitting-balance support: No upper extremity supported;Feet supported Sitting balance-Leahy Scale: Normal     Standing balance support: No upper extremity supported;During functional activity Standing balance-Leahy Scale: Good          ADL either performed or assessed with clinical judgement   ADL Overall ADL's : Needs assistance/impaired         General ADL Comments: MIN VCs self-feeding at bed level - assist for spillage. SUPERVISION don B socks seated EOB. CGA for ADL t/f                   Pertinent Vitals/Pain Pain Assessment: No/denies pain     Hand Dominance     Extremity/Trunk Assessment Upper Extremity Assessment Upper Extremity Assessment: Generalized weakness   Lower Extremity Assessment Lower Extremity Assessment: Generalized weakness       Communication Communication Communication: No difficulties   Cognition Arousal/Alertness: Awake/alert Behavior During Therapy: Agitated Overall Cognitive Status: No family/caregiver present to determine baseline cognitive functioning        General Comments: Pt is adamant that he was not confused and never stated he had poor intake as documented in chart that he said to provider. Initially states he lives c his daughters then upon further questioning states  he lives alone.    General Comments  SpO2 80% t/o session on 3.5-6L  (no change c PLB, rest, or increased O2 - unclear if poor pulse ox reading  vs mouth breathing). RN in room and aware    Exercises Exercises: Other exercises Other Exercises Other Exercises: Pt educated re: OT role, DME recs, d.c recs, falls prevention, ECS Other Exercises: LBD, self-feeding, sup>sit, sit<>stand, sitting/standing balance/tolerance, ~20 ft mobility   Shoulder Instructions      Home Living Family/patient expects to be discharged to:: Private residence Living Arrangements: Alone Available Help at Discharge: Available PRN/intermittently;Family (2 daughters live nearby) Type of Home: House Home Access: Stairs to enter Technical brewer of Steps: 3 Entrance Stairs-Rails: None Home Layout: One level     Bathroom Shower/Tub: Tub/shower unit;Walk-in shower   Bathroom Toilet: Standard     Home Equipment: None          Prior Functioning/Environment Level of Independence: Independent        Comments: Enjoys exercise and drives. Reports 1 fall recently falling off of a stool         OT Problem List: Decreased strength;Decreased activity tolerance;Decreased safety awareness      OT Treatment/Interventions: Self-care/ADL training;Therapeutic exercise;DME and/or AE instruction;Energy conservation;Therapeutic activities;Patient/family education;Balance training    OT Goals(Current goals can be found in the care plan section) Acute Rehab OT Goals Patient Stated Goal: To return home OT Goal Formulation: With patient Time For Goal Achievement: 11/20/20 Potential to Achieve Goals: Fair ADL Goals Pt Will Perform Grooming: Independently;standing Pt Will Transfer to Toilet: Independently;ambulating;regular height toilet Additional ADL Goal #1: Pt will Independently verbalize plan to implement x3 falls prevention strategies  OT Frequency: Min 1X/week   Barriers to D/C: Decreased caregiver support          Co-evaluation PT/OT/SLP Co-Evaluation/Treatment: Yes Reason for Co-Treatment: Necessary to address cognition/behavior during  functional activity PT goals addressed during session: Mobility/safety with mobility;Balance OT goals addressed during session: ADL's and self-care      AM-PAC OT "6 Clicks" Daily Activity     Outcome Measure Help from another person eating meals?: A Little Help from another person taking care of personal grooming?: A Little Help from another person toileting, which includes using toliet, bedpan, or urinal?: A Little Help from another person bathing (including washing, rinsing, drying)?: A Little Help from another person to put on and taking off regular upper body clothing?: A Little Help from another person to put on and taking off regular lower body clothing?: A Little 6 Click Score: 18   End of Session Equipment Utilized During Treatment: Gait belt;Oxygen Nurse Communication: Mobility status  Activity Tolerance: Patient tolerated treatment well Patient left: in chair;with call bell/phone within reach;with chair alarm set;with family/visitor present  OT Visit Diagnosis: Other abnormalities of gait and mobility (R26.89)                Time: 6803-2122 OT Time Calculation (min): 32 min Charges:  OT General Charges $OT Visit: 1 Visit OT Evaluation $OT Eval Moderate Complexity: 1 Mod OT Treatments $Self Care/Home Management : 8-22 mins  Dessie Coma, M.S. OTR/L  11/06/20, 1:31 PM  ascom [phone]

## 2020-11-06 NOTE — Consult Note (Signed)
Referring Physician: Dr. Roosevelt Locks    Chief Complaint: Diffuse weakness  HPI: David Perez is an 79 y.o. male with a PMHx of macrocytic anemia, hepatitis B as well as CAD s/p CABG, aortic valve replacement and clipping of atrial appendage in January, on chronic anticoagulation with Eliquis, who presented to the [facility] ED from his Hematologist's office on Friday with a c/c of hypotension, increasing SOB/fatigue, diffuse weakness and 14 lb weight loss over the past 6 weeks. He also has had an acute drop of hemoglobin to 8.6 in conjunction with an increase in his MCV and his hematologist suspects an underlying bone marrow disorder such as myelodysplastic syndrome. Regarding his diffuse weakness, he normally can walk but has recently resorted to a wheelchair for getting around.   During his stay here an MRI brain was obtained, which revealed a subcentimeter acute right frontal lobe stroke, located within the right motor strip. The patient denies any recent focal weakness on the left, stating that he just feels weak all over.   Neurology was consulted for stroke work up.    LSN: Unknown tPA Given: No. Time of onset unknown.   Past Medical History:  Diagnosis Date  . 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 Hepatitis B     Past Surgical History:  Procedure Laterality Date  . AORTIC VALVE REPLACEMENT N/A 12/31/2019   Procedure: AORTIC VALVE REPLACEMENT (AVR);  Surgeon: Wonda Olds, MD;  Location: [place];  Service: Open Heart Surgery;  Laterality: N/A;  . CLIPPING OF ATRIAL APPENDAGE Left 12/31/2019   Procedure: Clipping Of Atrial Appendage;  Surgeon: Wonda Olds, MD;  Location: [place];  Service: Open Heart Surgery;  Laterality: Left;  . CORONARY ARTERY BYPASS GRAFT N/A 12/31/2019   Procedure: CORONARY ARTERY BYPASS GRAFTING (CABG) TIMES THREE USING LEFT INTERNAL MAMMARY ARTERY AND LEFT GREATER SAPHENOUS LEG VEIN HARVESTED ENDOSCOPICALLY;  Surgeon: Wonda Olds, MD;   Location: [place];  Service: Open Heart Surgery;  Laterality: N/A;  POSSIBLE BILATERAL IMA  . CORONARY/GRAFT ACUTE MI REVASCULARIZATION N/A 12/31/2019   Procedure: Coronary/Graft Acute MI Revascularization;  Surgeon: Yolonda Kida, MD;  Location: [place] [facility];  Service: Cardiovascular;  Laterality: N/A;  . IR THORACENTESIS ASP PLEURAL SPACE W/IMG GUIDE  01/14/2020  . LEFT HEART CATH AND CORONARY ANGIOGRAPHY N/A 12/31/2019   Procedure: LEFT HEART CATH AND CORONARY ANGIOGRAPHY;  Surgeon: Yolonda Kida, MD;  Location: [place] [facility];  Service: Cardiovascular;  Laterality: N/A;  . MAZE N/A 12/31/2019   Procedure: MAZE;  Surgeon: Wonda Olds, MD;  Location: [place];  Service: Open Heart Surgery;  Laterality: N/A;  . TEE WITHOUT CARDIOVERSION N/A 12/31/2019   Procedure: TRANSESOPHAGEAL ECHOCARDIOGRAM (TEE);  Surgeon: Wonda Olds, MD;  Location: [place];  Service: Open Heart Surgery;  Laterality: N/A;    Family History  Problem Relation Age of Onset  . Cancer Father    Social History:  reports that he quit smoking about 25 years ago. He has a 20.00 pack-year smoking history. He has never used smokeless tobacco. He reports previous alcohol use. He reports previous drug use.  Allergies: No Known Allergies  Medications:  Prior to Admission:  Medications Prior to Admission  Medication Sig Dispense Refill Last Dose  . apixaban (ELIQUIS) 2.5 MG TABS tablet Take 1 tablet (2.5 mg total) by mouth every 12 (twelve) hours. 180 tablet 3 11/27/2020 at 0730  . atorvastatin (LIPITOR) 80 MG tablet Take 1 tablet (80  mg total) by mouth daily at 6 PM. 90 tablet 3 11/03/2020 at 1800  . carvedilol (COREG) 6.25 MG tablet Take 1 tablet (6.25 mg total) by mouth 2 (two) times daily. 60 tablet 11 11/26/2020 at 0730  . megestrol (MEGACE) 20 MG tablet Take 1 tablet (20 mg total) by mouth daily. 30 tablet 4 11/08/2020 at 0730  . sacubitril-valsartan (ENTRESTO) 49-51 MG Take 1 tablet by mouth 2 (two)  times daily. 180 tablet 1 11/08/2020 at 0730  . spironolactone (ALDACTONE) 25 MG tablet Take 1 tablet (25 mg total) by mouth at bedtime. 90 tablet 3 11/03/2020 at 1800   Scheduled: . apixaban  2.5 mg Oral Q12H  . atorvastatin  80 mg Oral q1800  . feeding supplement  1 Container Oral Q24H  . feeding supplement  237 mL Oral BID BM  . megestrol  20 mg Oral Daily  . midodrine  5 mg Oral TID WC    ROS: As per HPI.   Physical Examination: Blood pressure 115/71, pulse 99, temperature 97.8 F (36.6 C), temperature source Oral, resp. rate (!) 22, height 5\' 7"  (1.702 m), weight 45.8 kg, SpO2 93 %.  HEENT: [place]/AT Lungs: Tachypneic on O2 via [place]. Becomes SOB after minimal physiological stress such as speaking, standing or performing motor examination.  Ext: Cool extremities.   Neurologic Examination: Mental Status: Alert, oriented to day of the week, date and month, but not year ("2025"). Could not recall the city or state on his own. He is cooperative but with dysthymic mood. Fully conversant. No dysarthria. Speech is fluent with intact comprehension. Able to follow all commands without any comprehension deficit. Cranial Nerves: II:  Visual fields intact with no extinction to DSS. PERRL.  III,IV, VI: EOMI. No nystagmus. No ptosis.  V,VII: Face symmetric. Does not smile for examiner. Facial temp sensation equal bilaterally VIII: Hearing intact to conversation IX,X: Mild hypophonia XI: Symmetric shoulder shrug XII: midline tongue extension  Motor: Right : Upper extremity   4+/5    Left:     Upper extremity   4+/5  Lower extremity   4+/5    Lower extremity   4+/5 Decreased muscle bulk proximally and distally x 4.  Tone in this context is normal.  Sensory: Temp and light touch intact throughout, bilaterally. No extinction to DSS.  Deep Tendon Reflexes:  2+ bilateral brachioradialis.  Trace patellar reflexes bilaterally 0 achilles bilaterally  Cerebellar: No ataxia with FNF bilaterally  Gait:  Able to stand from seated position with own power, but with some difficulty. On standing, he has stooped posture and easy fatiguability, and is slightly unsteady.    Results for orders placed or performed during the hospital encounter of 11/27/2020 (from the past 48 hour(s))  Basic metabolic panel     Status: Abnormal   Collection Time: 11/05/2020  1:58 PM  Result Value Ref Range   Sodium 138 135 - 145 mmol/L   Potassium 4.6 3.5 - 5.1 mmol/L   Chloride 104 98 - 111 mmol/L   CO2 25 22 - 32 mmol/L   Glucose, Bld 124 (H) 70 - 99 mg/dL    Comment: Glucose reference range applies only to samples taken after fasting for at least 8 hours.   BUN 28 (H) 8 - 23 mg/dL   Creatinine, Ser 1.35 (H) 0.61 - 1.24 mg/dL   Calcium 9.4 8.9 - 10.3 mg/dL   GFR, Estimated 53 (L) >60 mL/min    Comment: (NOTE) Calculated using the CKD-EPI Creatinine Equation (2021)  Anion gap 9 5 - 15    Comment: Performed at [facility], [address] [place], [place] [address]  CBC     Status: Abnormal   Collection Time: 11/07/2020  1:58 PM  Result Value Ref Range   WBC 6.5 4.0 - 10.5 K/uL   RBC 2.40 (L) 4.22 - 5.81 MIL/uL   Hemoglobin 8.6 (L) 13.0 - 17.0 g/dL   HCT 26.2 (L) 39 - 52 %   MCV 109.2 (H) 80.0 - 100.0 fL   MCH 35.8 (H) 26.0 - 34.0 pg   MCHC 32.8 30.0 - 36.0 g/dL   RDW 19.2 (H) 11.5 - 15.5 %   Platelets 311 150 - 400 K/uL   nRBC 0.0 0.0 - 0.2 %    Comment: Performed at [facility], [address], [place], [place] [address]  Troponin I (High Sensitivity)     Status: None   Collection Time: 11/29/2020  1:58 PM  Result Value Ref Range   Troponin I (High Sensitivity) 10 <18 ng/L    Comment: (NOTE) Elevated high sensitivity troponin I (hsTnI) values and significant  changes across serial measurements may suggest ACS but many other  chronic and acute conditions are known to elevate hsTnI results.  Refer to the "Links" section for chest pain algorithms and additional  guidance. Performed  at [facility], [address] [place], [place] [address]   Resp Panel by RT-PCR (Flu A&B, Covid) Nasopharyngeal Swab     Status: None   Collection Time: 11/14/2020 10:03 PM   Specimen: Nasopharyngeal Swab; Nasopharyngeal(NP) swabs in vial transport medium  Result Value Ref Range   SARS Coronavirus 2 by RT PCR NEGATIVE NEGATIVE    Comment: (NOTE) SARS-CoV-2 target nucleic acids are NOT DETECTED.  The SARS-CoV-2 RNA is generally detectable in upper respiratory specimens during the acute phase of infection. The lowest concentration of SARS-CoV-2 viral copies this assay can detect is 138 copies/mL. A negative result does not preclude SARS-Cov-2 infection and should not be used as the sole basis for treatment or other patient management decisions. A negative result may occur with  improper specimen collection/handling, submission of specimen other than nasopharyngeal swab, presence of viral mutation(s) within the areas targeted by this assay, and inadequate number of viral copies(<138 copies/mL). A negative result must be combined with clinical observations, patient history, and epidemiological information. The expected result is Negative.  Fact Sheet for Patients:  EntrepreneurPulse.com.au  Fact Sheet for Healthcare Providers:  IncredibleEmployment.be  This test is no t yet approved or cleared by the [place] FDA and  has been authorized for detection and/or diagnosis of SARS-CoV-2 by FDA under an Emergency Use Authorization (EUA). This EUA will remain  in effect (meaning this test can be used) for the duration of the COVID-19 declaration under Section 564(b)(1) of the Act, 21 U.S.C.section 360bbb-3(b)(1), unless the authorization is terminated  or revoked sooner.       Influenza A by PCR NEGATIVE NEGATIVE   Influenza B by PCR NEGATIVE NEGATIVE    Comment: (NOTE) The Xpert Xpress SARS-CoV-2/FLU/RSV plus assay is intended as an  aid in the diagnosis of influenza from Nasopharyngeal swab specimens and should not be used as a sole basis for treatment. Nasal washings and aspirates are unacceptable for Xpert Xpress SARS-CoV-2/FLU/RSV testing.  Fact Sheet for Patients: EntrepreneurPulse.com.au  Fact Sheet for Healthcare Providers: IncredibleEmployment.be  This test is not yet approved or cleared by the [place] FDA and has been authorized for detection  and/or diagnosis of SARS-CoV-2 by FDA under an Emergency Use Authorization (EUA). This EUA will remain in effect (meaning this test can be used) for the duration of the COVID-19 declaration under Section 564(b)(1) of the Act, 21 U.S.C. section 360bbb-3(b)(1), unless the authorization is terminated or revoked.  Performed at [facility], [address] [place], [place] [address]   CBC     Status: Abnormal   Collection Time: 11/05/20  5:53 AM  Result Value Ref Range   WBC 6.8 4.0 - 10.5 K/uL   RBC 2.11 (L) 4.22 - 5.81 MIL/uL   Hemoglobin 7.7 (L) 13.0 - 17.0 g/dL   HCT 23.1 (L) 39 - 52 %   MCV 109.5 (H) 80.0 - 100.0 fL   MCH 36.5 (H) 26.0 - 34.0 pg   MCHC 33.3 30.0 - 36.0 g/dL   RDW 19.0 (H) 11.5 - 15.5 %   Platelets 275 150 - 400 K/uL   nRBC 0.0 0.0 - 0.2 %    Comment: Performed at [facility], [address], [place], [place] [address]  Basic metabolic panel     Status: Abnormal   Collection Time: 11/05/20  5:53 AM  Result Value Ref Range   Sodium 138 135 - 145 mmol/L   Potassium 3.7 3.5 - 5.1 mmol/L   Chloride 110 98 - 111 mmol/L   CO2 22 22 - 32 mmol/L   Glucose, Bld 94 70 - 99 mg/dL    Comment: Glucose reference range applies only to samples taken after fasting for at least 8 hours.   BUN 22 8 - 23 mg/dL   Creatinine, Ser 1.00 0.61 - 1.24 mg/dL   Calcium 7.7 (L) 8.9 - 10.3 mg/dL   GFR, Estimated >60 >60 mL/min    Comment: (NOTE) Calculated using the CKD-EPI Creatinine Equation  (2021)    Anion gap 6 5 - 15    Comment: Performed at [facility], [address] [place], [place] [address]  Iron and TIBC     Status: Abnormal   Collection Time: 11/05/20  8:08 PM  Result Value Ref Range   Iron 27 (L) 45 - 182 ug/dL   TIBC 151 (L) 250 - 450 ug/dL   Saturation Ratios 18 17.9 - 39.5 %   UIBC 124 ug/dL    Comment: Performed at [facility], [address] [place],  [address]  Vitamin B12     Status: None   Collection Time: 11/05/20  8:08 PM  Result Value Ref Range   Vitamin B-12 906 180 - 914 pg/mL    Comment: (NOTE) This assay is not validated for testing neonatal or myeloproliferative syndrome specimens for Vitamin B12 levels. Performed at [facility], [place] [address], [place], [place] [address]   Glucose, capillary     Status: Abnormal   Collection Time: 11/05/20 11:47 PM  Result Value Ref Range   Glucose-Capillary 150 (H) 70 - 99 mg/dL    Comment: Glucose reference range applies only to samples taken after fasting for at least 8 hours.  CBC with Differential/Platelet     Status: Abnormal   Collection Time: 11/06/20  5:15 AM  Result Value Ref Range   WBC 9.0 4.0 - 10.5 K/uL   RBC 2.30 (L) 4.22 - 5.81 MIL/uL   Hemoglobin 8.2 (L) 13.0 - 17.0 g/dL   HCT 24.3 (L) 39 - 52 %   MCV 105.7 (H) 80.0 - 100.0 fL   MCH 35.7 (H) 26.0 - 34.0  pg   MCHC 33.7 30.0 - 36.0 g/dL   RDW 18.7 (H) 11.5 - 15.5 %   Platelets 278 150 - 400 K/uL   nRBC 0.2 0.0 - 0.2 %   Neutrophils Relative % 81 %   Neutro Abs 7.3 1.7 - 7.7 K/uL   Lymphocytes Relative 12 %   Lymphs Abs 1.1 0.7 - 4.0 K/uL   Monocytes Relative 6 %   Monocytes Absolute 0.5 0.1 - 1.0 K/uL   Eosinophils Relative 0 %   Eosinophils Absolute 0.0 0.0 - 0.5 K/uL   Basophils Relative 0 %   Basophils Absolute 0.0 0.0 - 0.1 K/uL   Immature Granulocytes 1 %   Abs Immature Granulocytes 0.05 0.00 - 0.07 K/uL    Comment: Performed at [facility], [address]  [place], [place] [address]  TSH     Status: None   Collection Time: 11/06/20  5:15 AM  Result Value Ref Range   TSH 3.289 0.350 - 4.500 uIU/mL    Comment: Performed by a 3rd Generation assay with a functional sensitivity of <=0.01 uIU/mL. Performed at [facility], [address] [place], [place] [address]    CT HEAD WO CONTRAST  Result Date: 11/05/2020 CLINICAL DATA:  Altered level of consciousness, hypotension EXAM: CT HEAD WITHOUT CONTRAST TECHNIQUE: Contiguous axial images were obtained from the base of the skull through the vertex without intravenous contrast. COMPARISON:  None. FINDINGS: Brain: Hypodensities are seen throughout the periventricular white matter and bilateral basal ganglia, most compatible with chronic small vessel ischemic change. No other signs of acute infarct or hemorrhage. The lateral ventricles and remaining midline structures are unremarkable. Age-appropriate cerebral atrophy. No acute extra-axial fluid collections. No mass effect. Vascular: No hyperdense vessel or unexpected calcification. Skull: Normal. Negative for fracture or focal lesion. Sinuses/Orbits: No acute finding. Other: None. IMPRESSION: 1. Chronic appearing small vessel ischemic changes throughout the periventricular white matter and basal ganglia. 2. No acute intracranial process. Electronically Signed   By: Randa Ngo M.D.   On: 11/05/2020 15:21   MR BRAIN WO CONTRAST  Result Date: 11/05/2020 CLINICAL DATA:  Neuro deficit, acute, stroke suspected. Additional provided: Generalized weakness with occasional confusion. Slurred speech. EXAM: MRI HEAD WITHOUT CONTRAST TECHNIQUE: Multiplanar, multiecho pulse sequences of the brain and surrounding structures were obtained without intravenous contrast. COMPARISON:  Head CT 11/05/2020. FINDINGS: Brain: The patient was unable to tolerate the full examination. As a result, only axial and coronal diffusion-weighted sequences, a sagittal T1 weighted sequence and  an axial T2 weighted sequence could be obtained. The axial diffusion-weighted sequence is moderately motion degraded. The sagittal T1 weighted sequence is severely motion degraded. The axial T2 weighted sequence is moderate to severely motion degraded. Mild generalized cerebral atrophy. 3 mm focus of restricted diffusion compatible with acute infarction within the posterior right frontal lobe (motor strip) (series 2, image 85) (series 4, image 58). A punctate acute infarct is also questioned within the left cerebellar hemisphere (series 4, image 65). Advanced multifocal T2/FLAIR hyperintensity within the cerebral white matter which is nonspecific, but compatible chronic small vessel ischemic disease. Within the limitations of motion degradation, there is no appreciable intracranial mass or extra-axial fluid collection. No midline shift Vascular: Expected proximal arterial flow voids. Skull and upper cervical spine: No focal marrow lesion is identified. Sinuses/Orbits: Visualized orbits show no acute finding. Mild ethmoid sinus mucosal thickening. IMPRESSION: Prematurely terminated and significantly motion degraded examination, as described and limiting evaluation. 3 mm acute infarction within the  posterior right frontal lobe (motor strip). Possible additional punctate acute infarct within the left cerebellar hemisphere. Mild cerebral atrophy and advanced chronic small vessel ischemic disease. Electronically Signed   By: Kellie Simmering DO   On: 11/05/2020 19:43   CT ABDOMEN PELVIS W CONTRAST  Result Date: 11/19/2020 CLINICAL DATA:  [place] 2 weeks ago, right lower back pain, hypotension EXAM: CT ABDOMEN AND PELVIS WITH CONTRAST TECHNIQUE: Multidetector CT imaging of the abdomen and pelvis was performed using the standard protocol following bolus administration of intravenous contrast. CONTRAST:  53mL OMNIPAQUE IOHEXOL 300 MG/ML  SOLN COMPARISON:  01/14/2020 FINDINGS: Lower chest: Emphysema, with bibasilar areas of  scarring and fibrosis. No acute pleural or parenchymal lung disease. Hepatobiliary: No focal liver abnormality is seen. No gallstones, gallbladder wall thickening, or biliary dilatation. Pancreas: Unremarkable. No pancreatic ductal dilatation or surrounding inflammatory changes. Spleen: Normal in size without focal abnormality. Adrenals/Urinary Tract: Adrenal glands are unremarkable. Kidneys are normal, without renal calculi, focal lesion, or hydronephrosis. Bladder is unremarkable. Stomach/Bowel: No bowel obstruction or ileus. Portions of the descending and sigmoid colon are contained within a left inguinal hernia, with no evidence of obstruction, incarceration, or bowel ischemia. Vascular/Lymphatic: Aortic atherosclerosis. No enlarged abdominal or pelvic lymph nodes. Reproductive: Prostate is unremarkable. Other: There is a small amount of free fluid within the lower pelvis, as well as within the left inguinal hernia. No free intraperitoneal gas. Musculoskeletal: No acute or destructive bony lesions. Reconstructed images demonstrate no additional findings. IMPRESSION: 1. Left inguinal hernia containing a portion of the distal descending and sigmoid colon. No bowel obstruction, incarceration, or strangulation. 2. Trace free fluid within the lower pelvis and left inguinal hernia sac. 3. Aortic Atherosclerosis (ICD10-I70.0) and Emphysema (ICD10-J43.9). Electronically Signed   By: Randa Ngo M.D.   On: 12/02/2020 21:59   DG Chest Portable 1 View  Result Date: 11/03/2020 CLINICAL DATA:  [place] 1 week ago, hypotension EXAM: PORTABLE CHEST 1 VIEW COMPARISON:  02/29/2020 FINDINGS: Single frontal view of the chest demonstrates postsurgical changes from CABG and aortic valve replacement. Cardiac silhouette is unremarkable. No airspace disease, effusion, or pneumothorax. No acute bony abnormalities. IMPRESSION: 1. No acute intrathoracic process. Electronically Signed   By: Randa Ngo M.D.   On: 11/03/2020 17:50    DG Hip Unilat W or Wo Pelvis 2-3 Views Right  Result Date: 11/22/2020 CLINICAL DATA:  Right hip pain after fall 1 week ago EXAM: DG HIP (WITH OR WITHOUT PELVIS) 2-3V RIGHT COMPARISON:  None. FINDINGS: Frontal view of the pelvis as well as frontal and frogleg lateral views of the right hip are obtained. No fracture, subluxation, or dislocation. Mild symmetrical hip osteoarthritis. Moderate degenerative changes at the lumbosacral junction. IMPRESSION: 1. No acute bony abnormality. 2. Degenerative changes of the bilateral hips and lumbar spine. Electronically Signed   By: Randa Ngo M.D.   On: 11/05/2020 17:49    Assessment: 79 y.o. male with acute punctate ischemic infarction involving the right motor strip. He has no prior history of stroke.  1. Exam reveals no focal motor or sensory deficit, as well as no ataxia. However, he is partially disoriented to place and time. Significant shortness of breath with minimal exertion is noted.  2. MRI brain: 3 mm acute infarction within the posterior right frontal lobe (motor strip). Possible additional punctate acute infarct within the left cerebellar hemisphere. Mild cerebral atrophy and advanced chronic small vessel ischemic disease. 3. EKG: Wide QRS rhythm. Rightward axis. Left ventricular hypertrophy with QRS widening and repolarization  abnormality ( Cornell product ) 4. Stroke Risk Factors - CAD  5. On exam, he becomes tachypneic with SOB after minimal physiological stress such as speaking, standing or performing motor examination. Will need further work up for possible cardiac or pulmonary etiology.  6. Hypotension. Although he has been hypotensive, the small stroke seen on MRI is not in a watershed distribution.  7. Etiology of the stroke is most likely artery-to-artery embolization due to atherosclerotic disease, versus cardioembolic.   Recommendations: 1. TTE 2. CTA of head and neck  3. Cardiac telemetry 4. HgbA1c, fasting lipid panel 5. PT  consult, OT consult, Speech consult 6. Prophylactic therapy- Continue Eliquis 8. Frequent neuro checks  9. Avoid hypotension. Out of permissive HTN time window. Goal SBP of 120-140. 10. On exam, he becomes tachypneic with SOB after minimal physiological stress such as speaking, standing or performing motor examination. Will need further work up for possible cardiac or pulmonary etiology.    @Electronically  signed: Dr. Kerney Elbe 11/06/2020, 9:00 AM

## 2020-11-06 NOTE — Progress Notes (Signed)
   11/06/20 0019  Assess: MEWS Score  Temp 97.8 F (36.6 C)  BP 114/75  Pulse Rate (!) 116  Resp (!) 40  SpO2 94 %  O2 Device Nasal Cannula  O2 Flow Rate (L/min) 5 L/min  Assess: MEWS Score  MEWS Temp 0  MEWS Systolic 0  MEWS Pulse 2  MEWS RR 3  MEWS LOC 0  MEWS Score 5  MEWS Score Color Red  Assess: if the MEWS score is Yellow or Red  Were vital signs taken at a resting state? Yes  Focused Assessment Change from prior assessment (see assessment flowsheet)  Early Detection of Sepsis Score *See Row Information* Low  MEWS guidelines implemented *See Row Information* Yes  Treat  MEWS Interventions Other (Comment) (contacted NP Randol Kern)  Take Vital Signs  Increase Vital Sign Frequency  Red: Q 1hr X 4 then Q 4hr X 4, if remains red, continue Q 4hrs  Escalate  MEWS: Escalate Red: discuss with charge nurse/RN and provider, consider discussing with RRT  Notify: Charge Nurse/RN  Name of Charge Nurse/RN Notified Janith Lima, RN  Date Charge Nurse/RN Notified 11/06/20  Time Charge Nurse/RN Notified 0026  Notify: Provider  Provider Name/Title Sharion Settler, RN  Date Provider Notified 11/06/20  Time Provider Notified 0025  Notification Type  (text)  Notification Reason Change in status  Response See new orders  Date of Provider Response 11/06/20  Time of Provider Response 0038  Document  Patient Outcome Other (Comment) (RR are better but still restless)  Patient was given haldol one time. Vitals are better, respirations have decreased to 30 and heart rate was 108. Patient is still restless, trying to get out of bed. He has been redirected and on a low bed with floor mats and mittens in place. Will continue to monitor.

## 2020-11-06 NOTE — Progress Notes (Addendum)
PROGRESS NOTE    David Perez  EXN:[id number] DOB: Apr 14, 1941 DOA: 11/11/2020 PCP: Marval Regal, NP   Chief complaint.  Weakness. 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  He was sent from hematology clinic with hypotension. Discussed with patient daughter, patient had a fall about 5 days ago.  He has generalized weakness.  He occasionally confused.  He has a poor p.o. intake, he has lost 20 pounds of body weight.  He also has some loose stools.  He currently lives alone at home. When I see the patient today, he appeared to have some confusion.  Speech is slurred.  But no weakness in the legs or arms.  No facial droop. He had a CT scan of abdomen/pelvis in the emergency room, did not see any acute changes.  Hb7.7. 12/5.  MRI of the brain showed acute stroke in the right parietal lobe and the left cerebellar.   Assessment & Plan:   Principal Problem:   Hypotension due to hypovolemia Active Problems:   S/P CABG x 3   Debility   Macrocytic anemia   AKI (acute kidney injury) (HCC)   Chronic anticoagulation   Weight loss   Protein malnutrition (HCC)   Hypotension   Failure to thrive in adult  #1. acute embolic stroke. Paroxysmal atrial fibrillation. Acute metabolic encephalopathy. I discussed with patient daughter, patient was  previously diagnosed with atrial fibrillation.  Was on lower dose Eliquis. Stroke most likely secondary to embolic event from atrial fibrillation. Consult neurology pending.  Consider increase Eliquis to 5 mg twice a day versus an add anticoagulation. Patient is not a thrombolytic therapy candidate, symptoms started probably 2 or 3 days ago. Reviewed most recent echocardiogram performed July 2021, ejection fraction 45 to 50% with a moderate pulmonary hypertension. Patient mental status has improved.  #2.  Chronic systolic congestive  heart failure. Moderate pulmonary hypertension. Due to low blood pressure, heart medicine cannot be started.  Currently, patient is euvolemic, no evidence of volume overload.  3.  Acute hypoxemic respiratory failure. Patient developed hypoxemia last night.  Will obtain speech therapy to rule out aspiration.  Patient also received a fluid yesterday, I will hold off diuretics for now.  Continue oxygen treatment, check a BNP in the morning.  4.  Hypotension. Patient will no longer has any dehydration.  Hold IV fluids. I will increase midodrine to 5 mg 3 times a day. I will check cortisol level to rule out adrenal insufficiency. 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  5.  Anemia of chronic disease. B12 level normal, iron level only borderline.  Will follow.  6.  Acute kidney injury. Renal function improved after fluids.  7.  Svere protein calorie malnutrition. Patient BMI is 15.8, significant muscle atrophy. Protein supplement.      DVT prophylaxis: Eliquis Code Status: DNR Family Communication: Daughter at the bedside Disposition Plan:  .   Status is: Inpatient  Remains inpatient appropriate because:Inpatient level of care appropriate due to severity of illness   Dispo: The patient is from: Home              Anticipated d/c is to: SNF              Anticipated d/c date is: 2 days              Patient currently is not medically stable to d/c.        I/O last  3 completed shifts: In: 140 [P.O.:140] Out: 100 [Urine:100] Total I/O In: -  Out: 39 [Urine:75]     Consultants:   Neurology  Procedures: None  Antimicrobials: None  Subjective: Patient feels better today, less confused.  Slurred speech is improving.  Still has generalized weakness. Denies any dizziness or lightheaded. No shortness of breath or cough, however, patient was placed on 4 L oxygen last night. No abdominal pain or nausea vomiting.  No diarrhea. No fever or chills  Objective: Vitals:   11/06/20  0153 11/06/20 0519 11/06/20 0838 11/06/20 1159  BP: (!) 101/49 (!) 102/57 115/71 (!) 86/58  Pulse: (!) 108 88 99 99  Resp: (!) 30 (!) 28 (!) 22 20  Temp: 98.5 F (36.9 C) 98.1 F (36.7 C) 97.8 F (36.6 C) (!) 97.5 F (36.4 C)  TempSrc:  Oral Oral Oral  SpO2: 93% 95% 93% 93%  Weight:      Height:        Intake/Output Summary (Last 24 hours) at 11/06/2020 1319 Last data filed at 11/06/2020 0846 Gross per 24 hour  Intake 140 ml  Output 75 ml  Net 65 ml   Filed Weights   11/15/2020 1350  Weight: 45.8 kg    Examination:  General exam: Appears calm and comfortable  Respiratory system: Clear to auscultation. Respiratory effort normal. Cardiovascular system: S1 & S2 heard, RRR. No JVD, murmurs, rubs, gallops or clicks. No pedal edema. Gastrointestinal system: Abdomen is nondistended, soft and nontender. No organomegaly or masses felt. Normal bowel sounds heard. Central nervous system: Alert and oriented x3. No focal neurological deficits. Extremities: Symmetric  Skin: No rashes, lesions or ulcers Psychiatry: Judgement and insight appear normal. Mood & affect appropriate.     Data Reviewed: I have personally reviewed following labs and imaging studies  CBC: Recent Labs  Lab 11/02/20 1131 11/23/2020 1358 11/05/20 0553 11/06/20 0515  WBC 8.4 6.5 6.8 9.0  NEUTROABS 7.0  --   --  7.3  HGB 8.1* 8.6* 7.7* 8.2*  HCT 24.8* 26.2* 23.1* 24.3*  MCV 109.7* 109.2* 109.5* 105.7*  PLT 294 311 275 878   Basic Metabolic Panel: Recent Labs  Lab 11/02/20 1131 11/05/2020 1358 11/05/20 0553  NA 140 138 138  K 4.3 4.6 3.7  CL 105 104 110  CO2 26 25 22   GLUCOSE 171* 124* 94  BUN 32* 28* 22  CREATININE 1.09 1.35* 1.00  CALCIUM 9.2 9.4 7.7*   GFR: Estimated Creatinine Clearance: 37.5 mL/min (by C-G formula based on SCr of 1 mg/dL). Liver Function Tests: Recent Labs  Lab 11/02/20 1131  AST 67*  ALT 103*  ALKPHOS 75  BILITOT 0.9  PROT 6.9  ALBUMIN 2.4*   No results for  input(s): LIPASE, AMYLASE in the last 168 hours. No results for input(s): AMMONIA in the last 168 hours. Coagulation Profile: No results for input(s): INR, PROTIME in the last 168 hours. Cardiac Enzymes: No results for input(s): CKTOTAL, CKMB, CKMBINDEX, TROPONINI in the last 168 hours. BNP (last 3 results) No results for input(s): PROBNP in the last 8760 hours. HbA1C: No results for input(s): HGBA1C in the last 72 hours. CBG: Recent Labs  Lab 11/05/20 2347  GLUCAP 150*   Lipid Profile: No results for input(s): CHOL, HDL, LDLCALC, TRIG, CHOLHDL, LDLDIRECT in the last 72 hours. Thyroid Function Tests: Recent Labs    11/06/20 0515  TSH 3.289   Anemia Panel: Recent Labs    11/05/20 2008  VITAMINB12 906  TIBC 151*  IRON  27*   Sepsis Labs: No results for input(s): PROCALCITON, LATICACIDVEN in the last 168 hours.  Recent Results (from the past 240 hour(s))  Resp Panel by RT-PCR (Flu A&B, Covid) Nasopharyngeal Swab     Status: None   Collection Time: 11/22/2020 10:03 PM   Specimen: Nasopharyngeal Swab; Nasopharyngeal(NP) swabs in vial transport medium  Result Value Ref Range Status   SARS Coronavirus 2 by RT PCR NEGATIVE NEGATIVE Final    Comment: (NOTE) SARS-CoV-2 target nucleic acids are NOT DETECTED.  The SARS-CoV-2 RNA is generally detectable in upper respiratory specimens during the acute phase of infection. The lowest concentration of SARS-CoV-2 viral copies this assay can detect is 138 copies/mL. A negative result does not preclude SARS-Cov-2 infection and should not be used as the sole basis for treatment or other patient management decisions. A negative result may occur with  improper specimen collection/handling, submission of specimen other than nasopharyngeal swab, presence of viral mutation(s) within the areas targeted by this assay, and inadequate number of viral copies(<138 copies/mL). A negative result must be combined with clinical observations, patient  history, and epidemiological information. The expected result is Negative.  Fact Sheet for Patients:  EntrepreneurPulse.com.au  Fact Sheet for Healthcare Providers:  IncredibleEmployment.be  This test is no t yet approved or cleared by the [place] FDA and  has been authorized for detection and/or diagnosis of SARS-CoV-2 by FDA under an Emergency Use Authorization (EUA). This EUA will remain  in effect (meaning this test can be used) for the duration of the COVID-19 declaration under Section 564(b)(1) of the Act, 21 U.S.C.section 360bbb-3(b)(1), unless the authorization is terminated  or revoked sooner.       Influenza A by PCR NEGATIVE NEGATIVE Final   Influenza B by PCR NEGATIVE NEGATIVE Final    Comment: (NOTE) The Xpert Xpress SARS-CoV-2/FLU/RSV plus assay is intended as an aid in the diagnosis of influenza from Nasopharyngeal swab specimens and should not be used as a sole basis for treatment. Nasal washings and aspirates are unacceptable for Xpert Xpress SARS-CoV-2/FLU/RSV testing.  Fact Sheet for Patients: EntrepreneurPulse.com.au  Fact Sheet for Healthcare Providers: IncredibleEmployment.be  This test is not yet approved or cleared by the [place] FDA and has been authorized for detection and/or diagnosis of SARS-CoV-2 by FDA under an Emergency Use Authorization (EUA). This EUA will remain in effect (meaning this test can be used) for the duration of the COVID-19 declaration under Section 564(b)(1) of the Act, 21 U.S.C. section 360bbb-3(b)(1), unless the authorization is terminated or revoked.  Performed at [facility], [address] [place], [place] [address]          Radiology Studies: CT HEAD WO CONTRAST  Result Date: 11/05/2020 CLINICAL DATA:  Altered level of consciousness, hypotension EXAM: CT HEAD WITHOUT CONTRAST TECHNIQUE: Contiguous axial images were  obtained from the base of the skull through the vertex without intravenous contrast. COMPARISON:  None. FINDINGS: Brain: Hypodensities are seen throughout the periventricular white matter and bilateral basal ganglia, most compatible with chronic small vessel ischemic change. No other signs of acute infarct or hemorrhage. The lateral ventricles and remaining midline structures are unremarkable. Age-appropriate cerebral atrophy. No acute extra-axial fluid collections. No mass effect. Vascular: No hyperdense vessel or unexpected calcification. Skull: Normal. Negative for fracture or focal lesion. Sinuses/Orbits: No acute finding. Other: None. IMPRESSION: 1. Chronic appearing small vessel ischemic changes throughout the periventricular white matter and basal ganglia. 2. No acute intracranial process. Electronically Signed   By: Legrand Como  Owens Shark M.D.   On: 11/05/2020 15:21   MR BRAIN WO CONTRAST  Result Date: 11/05/2020 CLINICAL DATA:  Neuro deficit, acute, stroke suspected. Additional provided: Generalized weakness with occasional confusion. Slurred speech. EXAM: MRI HEAD WITHOUT CONTRAST TECHNIQUE: Multiplanar, multiecho pulse sequences of the brain and surrounding structures were obtained without intravenous contrast. COMPARISON:  Head CT 11/05/2020. FINDINGS: Brain: The patient was unable to tolerate the full examination. As a result, only axial and coronal diffusion-weighted sequences, a sagittal T1 weighted sequence and an axial T2 weighted sequence could be obtained. The axial diffusion-weighted sequence is moderately motion degraded. The sagittal T1 weighted sequence is severely motion degraded. The axial T2 weighted sequence is moderate to severely motion degraded. Mild generalized cerebral atrophy. 3 mm focus of restricted diffusion compatible with acute infarction within the posterior right frontal lobe (motor strip) (series 2, image 85) (series 4, image 58). A punctate acute infarct is also questioned  within the left cerebellar hemisphere (series 4, image 65). Advanced multifocal T2/FLAIR hyperintensity within the cerebral white matter which is nonspecific, but compatible chronic small vessel ischemic disease. Within the limitations of motion degradation, there is no appreciable intracranial mass or extra-axial fluid collection. No midline shift Vascular: Expected proximal arterial flow voids. Skull and upper cervical spine: No focal marrow lesion is identified. Sinuses/Orbits: Visualized orbits show no acute finding. Mild ethmoid sinus mucosal thickening. IMPRESSION: Prematurely terminated and significantly motion degraded examination, as described and limiting evaluation. 3 mm acute infarction within the posterior right frontal lobe (motor strip). Possible additional punctate acute infarct within the left cerebellar hemisphere. Mild cerebral atrophy and advanced chronic small vessel ischemic disease. Electronically Signed   By: Kellie Simmering DO   On: 11/05/2020 19:43   CT ABDOMEN PELVIS W CONTRAST  Result Date: 11/27/2020 CLINICAL DATA:  [place] 2 weeks ago, right lower back pain, hypotension EXAM: CT ABDOMEN AND PELVIS WITH CONTRAST TECHNIQUE: Multidetector CT imaging of the abdomen and pelvis was performed using the standard protocol following bolus administration of intravenous contrast. CONTRAST:  40mL OMNIPAQUE IOHEXOL 300 MG/ML  SOLN COMPARISON:  01/14/2020 FINDINGS: Lower chest: Emphysema, with bibasilar areas of scarring and fibrosis. No acute pleural or parenchymal lung disease. Hepatobiliary: No focal liver abnormality is seen. No gallstones, gallbladder wall thickening, or biliary dilatation. Pancreas: Unremarkable. No pancreatic ductal dilatation or surrounding inflammatory changes. Spleen: Normal in size without focal abnormality. Adrenals/Urinary Tract: Adrenal glands are unremarkable. Kidneys are normal, without renal calculi, focal lesion, or hydronephrosis. Bladder is unremarkable.  Stomach/Bowel: No bowel obstruction or ileus. Portions of the descending and sigmoid colon are contained within a left inguinal hernia, with no evidence of obstruction, incarceration, or bowel ischemia. Vascular/Lymphatic: Aortic atherosclerosis. No enlarged abdominal or pelvic lymph nodes. Reproductive: Prostate is unremarkable. Other: There is a small amount of free fluid within the lower pelvis, as well as within the left inguinal hernia. No free intraperitoneal gas. Musculoskeletal: No acute or destructive bony lesions. Reconstructed images demonstrate no additional findings. IMPRESSION: 1. Left inguinal hernia containing a portion of the distal descending and sigmoid colon. No bowel obstruction, incarceration, or strangulation. 2. Trace free fluid within the lower pelvis and left inguinal hernia sac. 3. Aortic Atherosclerosis (ICD10-I70.0) and Emphysema (ICD10-J43.9). Electronically Signed   By: Randa Ngo M.D.   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 Cardiac silhouette is unremarkable. No airspace disease, effusion, or pneumothorax. No acute bony abnormalities. IMPRESSION: 1. No acute intrathoracic process. Electronically Signed   By: Randa Ngo M.D.   On: 11/05/2020 17:50   DG Hip Unilat W or Wo Pelvis 2-3 Views Right  Result Date: 11/30/2020 CLINICAL DATA:  Right hip pain after fall 1 week ago EXAM: DG HIP (WITH OR WITHOUT PELVIS) 2-3V RIGHT COMPARISON:  None. FINDINGS: Frontal view of the pelvis as well as frontal and frogleg lateral views of the right hip are obtained. No fracture, subluxation, or dislocation. Mild symmetrical hip osteoarthritis. Moderate degenerative changes at the lumbosacral junction. IMPRESSION: 1. No acute bony abnormality.  2. Degenerative changes of the bilateral hips and lumbar spine. Electronically Signed   By: Randa Ngo M.D.   On: 11/08/2020 17:49        Scheduled Meds: . apixaban  2.5 mg Oral Q12H  . atorvastatin  80 mg Oral q1800  . feeding supplement  1 Container Oral Q24H  . feeding supplement  237 mL Oral BID BM  . megestrol  20 mg Oral Daily  . midodrine  2.5 mg Oral TID WC   Continuous Infusions:   LOS: 1 day    Time spent: 36 minutes    Sharen Hones, MD [facility]   To contact the attending provider between 7A-7P or the covering provider during after hours 7P-7A, please log into the web site [email or web address] and access using universal [place] password for that web site. If you do not have the password, please call the hospital operator.  11/06/2020, 1:19 PM

## 2020-11-06 NOTE — Progress Notes (Signed)
NP notified of pt's D-Dimer. CT angio ordered.

## 2020-11-06 NOTE — Progress Notes (Signed)
   11/06/20 1953  Assess: MEWS Score  Temp 97.6 F (36.4 C)  BP 113/69  Pulse Rate (!) 105  Resp (!) 36  SpO2 94 %  O2 Device Nasal Cannula  Assess: MEWS Score  MEWS Temp 0  MEWS Systolic 0  MEWS Pulse 1  MEWS RR 3  MEWS LOC 0  MEWS Score 4  MEWS Score Color Red  Assess: if the MEWS score is Yellow or Red  Were vital signs taken at a resting state? Yes  Focused Assessment No change from prior assessment  Early Detection of Sepsis Score *See Row Information* High  MEWS guidelines implemented *See Row Information* Yes  Take Vital Signs  Increase Vital Sign Frequency  Red: Q 1hr X 4 then Q 4hr X 4, if remains red, continue Q 4hrs  Escalate  MEWS: Escalate Red: discuss with charge nurse/RN and provider, consider discussing with RRT  Notify: Charge Nurse/RN  Name of Charge Nurse/RN Notified Marscha RN   Date Charge Nurse/RN Notified 11/06/20  Time Charge Nurse/RN Notified 2013  Notify: Provider  Provider Name/Title Sharion Settler   Date Provider Notified 11/06/20  Time Provider Notified 2013  Notification Type Page  Notification Reason Other (Comment) (Red MEWS score)  Response See new orders  Date of Provider Response 11/06/20  Time of Provider Response 2018  Document  Patient Outcome Stabilized after interventions  Patient is a MEWS of 4 because of his HR and RR. NP notified. See new orders.

## 2020-11-06 NOTE — Evaluation (Signed)
Physical Therapy Evaluation Patient Details Name: Leanard Dimaio MRN: [id number] DOB: 1941-05-11 Today's Date: 11/06/2020   History of Present Illness  Patient is an 79 year old male with history of CAD s/p CABG, essential HTN, recent work up for myoplastic syndrome. Admitted with hypotension due to hypovolemia. Confusion with slurred speech, recent fall, failure to thrive, anemia, acute kidney injury. 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 Patient did agree to participate with PT after encouragement and education on role of PT in the hospital setting. He reports he lives alone and is normally independent with mobility at baseline, often driving to the gym to workout. Patient required Min guard assistance for ambulating a short distance in the room without assistive device. Patient is limited by increased work of breathing and shortness of breath with minimal activity. Sp02 mid to low 80's with standing activity on supplemental oxygen which he reports is not required at baseline. With rest break, Sp02 increased to high 80's.  Extra time required to complete  tasks due to fatigue. Recommend PT to maximize independence and address functional limitations listed below. Given safety concerns with cognition and mobility, supervision is recommended at discharge. If family is unable to provide supervision, consider SNF placement.     Follow Up Recommendations Supervision for mobility/OOB;Home health PT (recommend SNF if family unable to provide supervision )    Equipment Recommendations  None recommended by PT    Recommendations for Other Services       Precautions / Restrictions Precautions Precautions: Fall Restrictions Weight Bearing Restrictions: No      Mobility  Bed Mobility Overal bed mobility: Modified Independent             General bed  mobility comments: supine to short sitting     Transfers Overall transfer level: Needs assistance Equipment used: None Transfers: Sit to/from Stand Sit to Stand: Supervision         General transfer comment: supervision for safety  Ambulation/Gait Ambulation/Gait assistance: Min guard Gait Distance (Feet): 30 Feet Assistive device: None Gait Pattern/deviations: Trunk flexed;Narrow base of support Gait velocity: decreased   General Gait Details: no gross loss of balance with ambulation without assistive device. Min guard for safety. Patient fatigued with increased work of breathing with walking a short distance with Sp02 in the mid to low 80's on oxygen during activity.   Stairs            Wheelchair Mobility    Modified Rankin (Stroke Patients Only)       Balance Overall balance assessment: Needs assistance Sitting-balance support: No upper extremity supported;Feet supported Sitting balance-Leahy Scale: Normal     Standing balance support: During functional activity;No upper extremity supported Standing balance-Leahy Scale: Good Standing balance comment: no loss of balance with reaching outside base of support                              Pertinent Vitals/Pain Pain Assessment: No/denies pain    Home Living Family/patient expects to be discharged to:: Private residence Living Arrangements: Alone Available Help at Discharge: Available PRN/intermittently Type of Home: House Home Access: Stairs to enter Entrance Stairs-Rails: None Entrance Stairs-Number of Steps: 3 Home Layout: One level Home Equipment: None      Prior Function Level of Independence: Independent  Comments: Drives to the gym frequently      Hand Dominance   Dominant Hand: Right    Extremity/Trunk Assessment   Upper Extremity Assessment Upper Extremity Assessment: Defer to OT evaluation    Lower Extremity Assessment Lower Extremity Assessment: Generalized  weakness;RLE deficits/detail;LLE deficits/detail RLE Deficits / Details: 5/5 with MMT of dorsiflexion, plantarflexion, knee extension. endurance imparied for sustinaed activity  RLE Sensation: WNL LLE Deficits / Details: 5/5 with MMT of dorsiflexion, plantarflexion, knee extension. endurance imparied for sustinaed activity  LLE Sensation: WNL       Communication   Communication: No difficulties  Cognition Arousal/Alertness: Awake/alert Behavior During Therapy: Agitated Overall Cognitive Status: No family/caregiver present to determine baseline cognitive functioning                                 General Comments: Pt is adamant that he was not confused and never stated he had poor intake as documented in chart that he said to provider. Initially states he lives c his daughters then upon further questioning states he lives alone.       General Comments General comments (skin integrity, edema, etc.): SpO2 80% t/o session on 3.5-6L [place] (no change c PLB, rest, or increased O2 - unclear if poor pulse ox reading vs mouth breathing). RN in room and aware    Exercises Other Exercises Other Exercises: Pt educated re: OT role, DME recs, d.c recs, falls prevention, ECS Other Exercises: LBD, self-feeding, sup>sit, sit<>stand, sitting/standing balance/tolerance, ~20 ft mobility   Assessment/Plan    PT Assessment Patient needs continued PT services  PT Problem List Decreased strength;Decreased activity tolerance;Decreased balance;Decreased mobility;Decreased knowledge of use of DME;Decreased knowledge of precautions;Decreased safety awareness;Cardiopulmonary status limiting activity       PT Treatment Interventions DME instruction;Gait training;Stair training;Functional mobility training;Therapeutic activities;Therapeutic exercise;Balance training;Neuromuscular re-education;Patient/family education    PT Goals (Current goals can be found in the Care Plan section)  Acute Rehab PT  Goals Patient Stated Goal: to return home  PT Goal Formulation: With patient Time For Goal Achievement: 11/20/20 Potential to Achieve Goals: Fair    Frequency Min 2X/week   Barriers to discharge        Co-evaluation PT/OT/SLP Co-Evaluation/Treatment: Yes Reason for Co-Treatment: Complexity of the patient's impairments (multi-system involvement) PT goals addressed during session: Mobility/safety with mobility OT goals addressed during session: ADL's and self-care       AM-PAC PT "6 Clicks" Mobility  Outcome Measure Help needed turning from your back to your side while in a flat bed without using bedrails?: None Help needed moving from lying on your back to sitting on the side of a flat bed without using bedrails?: None Help needed moving to and from a bed to a chair (including a wheelchair)?: A Little Help needed standing up from a chair using your arms (e.g., wheelchair or bedside chair)?: A Little Help needed to walk in hospital room?: A Little Help needed climbing 3-5 steps with a railing? : A Little 6 Click Score: 20    End of Session Equipment Utilized During Treatment: Gait belt Activity Tolerance: Patient limited by fatigue Patient left: in chair;with call bell/phone within reach;with chair alarm set;with family/visitor present (daughter present at end of session ) Nurse Communication: Mobility status PT Visit Diagnosis: Unsteadiness on feet (R26.81);History of falling (Z91.81);Muscle weakness (generalized) (M62.81)    Time: 6195-0932 PT Time Calculation (min) (ACUTE ONLY): 33 min   Charges:  PT Evaluation $PT Eval Moderate Complexity: 1 Mod PT Treatments $Therapeutic Activity: 8-22 mins       Minna Merritts, PT, MPT   Percell Locus 11/06/2020, 3:06 PM

## 2020-11-06 NOTE — Progress Notes (Signed)
Patient is currently resting. Will continue to monitor.  David Perez

## 2020-11-07 ENCOUNTER — Inpatient Hospital Stay (HOSPITAL_COMMUNITY)
Admit: 2020-11-07 | Discharge: 2020-11-07 | Disposition: A | Discharge status: Home / Self Care | Payer: Medicare Other | Attending: Acute Care | Admitting: Acute Care

## 2020-11-07 DIAGNOSIS — Z515 Encounter for palliative care: Secondary | ICD-10-CM

## 2020-11-07 DIAGNOSIS — I5023 Acute on chronic systolic (congestive) heart failure: Secondary | ICD-10-CM

## 2020-11-07 DIAGNOSIS — Z7189 Other specified counseling: Secondary | ICD-10-CM

## 2020-11-07 DIAGNOSIS — Z951 Presence of aortocoronary bypass graft: Secondary | ICD-10-CM

## 2020-11-07 DIAGNOSIS — J9601 Acute respiratory failure with hypoxia: Secondary | ICD-10-CM

## 2020-11-07 DIAGNOSIS — I9589 Other hypotension: Principal | ICD-10-CM

## 2020-11-07 DIAGNOSIS — J690 Pneumonitis due to inhalation of food and vomit: Secondary | ICD-10-CM

## 2020-11-07 DIAGNOSIS — I639 Cerebral infarction, unspecified: Secondary | ICD-10-CM

## 2020-11-07 DIAGNOSIS — I480 Paroxysmal atrial fibrillation: Secondary | ICD-10-CM

## 2020-11-07 DIAGNOSIS — E861 Hypovolemia: Secondary | ICD-10-CM

## 2020-11-07 DIAGNOSIS — R5381 Other malaise: Secondary | ICD-10-CM

## 2020-11-07 LAB — CBC WITH DIFFERENTIAL/PLATELET
Abs Immature Granulocytes: 0.08 10*3/uL — ABNORMAL HIGH (ref 0.00–0.07)
Basophils Absolute: 0 10*3/uL (ref 0.0–0.1)
Basophils Relative: 0 %
Eosinophils Absolute: 0.1 10*3/uL (ref 0.0–0.5)
Eosinophils Relative: 1 %
HCT: 28.6 % — ABNORMAL LOW (ref 39.0–52.0)
Hemoglobin: 9.1 g/dL — ABNORMAL LOW (ref 13.0–17.0)
Immature Granulocytes: 1 %
Lymphocytes Relative: 15 %
Lymphs Abs: 2.5 10*3/uL (ref 0.7–4.0)
MCH: 35.7 pg — ABNORMAL HIGH (ref 26.0–34.0)
MCHC: 31.8 g/dL (ref 30.0–36.0)
MCV: 112.2 fL — ABNORMAL HIGH (ref 80.0–100.0)
Monocytes Absolute: 0.5 10*3/uL (ref 0.1–1.0)
Monocytes Relative: 3 %
Neutro Abs: 12.9 10*3/uL — ABNORMAL HIGH (ref 1.7–7.7)
Neutrophils Relative %: 80 %
Platelets: 335 10*3/uL (ref 150–400)
RBC: 2.55 MIL/uL — ABNORMAL LOW (ref 4.22–5.81)
RDW: 19.3 % — ABNORMAL HIGH (ref 11.5–15.5)
Smear Review: NORMAL
WBC: 16.1 10*3/uL — ABNORMAL HIGH (ref 4.0–10.5)
nRBC: 0.2 % (ref 0.0–0.2)

## 2020-11-07 LAB — COMPREHENSIVE METABOLIC PANEL
ALT: 49 U/L — ABNORMAL HIGH (ref 0–44)
AST: 42 U/L — ABNORMAL HIGH (ref 15–41)
Albumin: 3 g/dL — ABNORMAL LOW (ref 3.5–5.0)
Alkaline Phosphatase: 67 U/L (ref 38–126)
Anion gap: 15 (ref 5–15)
BUN: 27 mg/dL — ABNORMAL HIGH (ref 8–23)
CO2: 13 mmol/L — ABNORMAL LOW (ref 22–32)
Calcium: 8.2 mg/dL — ABNORMAL LOW (ref 8.9–10.3)
Chloride: 109 mmol/L (ref 98–111)
Creatinine, Ser: 1.42 mg/dL — ABNORMAL HIGH (ref 0.61–1.24)
GFR, Estimated: 50 mL/min — ABNORMAL LOW (ref >60)
Glucose, Bld: 303 mg/dL — ABNORMAL HIGH (ref 70–99)
Potassium: 4.4 mmol/L (ref 3.5–5.1)
Sodium: 137 mmol/L (ref 135–145)
Total Bilirubin: 1.3 mg/dL — ABNORMAL HIGH (ref 0.3–1.2)
Total Protein: 6.8 g/dL (ref 6.5–8.1)

## 2020-11-07 LAB — TROPONIN I (HIGH SENSITIVITY)
Troponin I (High Sensitivity): 203 ng/L (ref <18)
Troponin I (High Sensitivity): 215 ng/L (ref <18)
Troponin I (High Sensitivity): 376 ng/L (ref <18)

## 2020-11-07 LAB — PROCALCITONIN: Procalcitonin: 2.79 ng/mL

## 2020-11-07 LAB — ECHOCARDIOGRAM COMPLETE
AR max vel: 2.41 cm2
AV Area VTI: 2.5 cm2
AV Area mean vel: 2.32 cm2
AV Mean grad: 8 mmHg
AV Peak grad: 14.1 mmHg
Ao pk vel: 1.88 m/s
Area-P 1/2: 8.16 cm2
Height: 67 in
S' Lateral: 4.01 cm
Weight: 2336 oz

## 2020-11-07 LAB — CORTISOL: Cortisol, Plasma: 37.1 ug/dL

## 2020-11-07 LAB — MAGNESIUM: Magnesium: 2.2 mg/dL (ref 1.7–2.4)

## 2020-11-07 LAB — BRAIN NATRIURETIC PEPTIDE: B Natriuretic Peptide: 3841.8 pg/mL — ABNORMAL HIGH (ref 0.0–100.0)

## 2020-11-07 LAB — LACTIC ACID, PLASMA
Lactic Acid, Venous: 7.6 mmol/L (ref 0.5–1.9)
Lactic Acid, Venous: 6.8 mmol/L (ref 0.5–1.9)

## 2020-11-07 MED ORDER — LORAZEPAM 2 MG/ML IJ SOLN: 1.0000 mg | Freq: Four times a day (QID) | INTRAMUSCULAR | Status: DC | PRN

## 2020-11-07 MED ORDER — MORPHINE SULFATE (PF) 2 MG/ML IV SOLN
1.0000 mg | Freq: Once | INTRAVENOUS | Status: AC
  Administered 2020-11-07: 1 mg via INTRAVENOUS
  Filled 2020-11-07: qty 1

## 2020-11-07 MED ORDER — MORPHINE SULFATE (PF) 2 MG/ML IV SOLN
2.0000 mg | INTRAVENOUS | Status: DC | PRN
  Administered 2020-11-07: 2 mg via INTRAVENOUS
  Filled 2020-11-07: qty 1

## 2020-11-07 MED ORDER — FUROSEMIDE 10 MG/ML IJ SOLN: 40.0000 mg | Freq: Two times a day (BID) | INTRAMUSCULAR | Status: DC

## 2020-11-07 MED ORDER — FUROSEMIDE 10 MG/ML IJ SOLN
60.0000 mg | Freq: Two times a day (BID) | INTRAMUSCULAR | Status: DC
  Administered 2020-11-07: 60 mg via INTRAVENOUS
  Filled 2020-11-07 (×2): qty 6

## 2020-11-07 MED ORDER — ALBUMIN HUMAN 25 % IV SOLN
25.0000 g | Freq: Two times a day (BID) | INTRAVENOUS | Status: DC
  Administered 2020-11-07 (×2): 25 g via INTRAVENOUS
  Filled 2020-11-07: qty 100

## 2020-11-07 MED ORDER — PIPERACILLIN-TAZOBACTAM 3.375 G IVPB 30 MIN: 3.3750 g | Freq: Three times a day (TID) | INTRAVENOUS | Status: DC

## 2020-11-07 MED ORDER — FUROSEMIDE 10 MG/ML IJ SOLN
40.0000 mg | Freq: Once | INTRAMUSCULAR | Status: AC
  Administered 2020-11-07: 40 mg via INTRAVENOUS
  Filled 2020-11-07: qty 4

## 2020-11-07 MED ORDER — METHYLPREDNISOLONE SODIUM SUCC 40 MG IJ SOLR
40.0000 mg | Freq: Two times a day (BID) | INTRAMUSCULAR | Status: DC
  Administered 2020-11-07 – 2020-11-08 (×3): 40 mg via INTRAVENOUS
  Filled 2020-11-07 (×3): qty 1

## 2020-11-07 MED ORDER — FUROSEMIDE 10 MG/ML IJ SOLN
60.0000 mg | Freq: Two times a day (BID) | INTRAMUSCULAR | Status: DC
  Administered 2020-11-07: 60 mg via INTRAVENOUS
  Filled 2020-11-07: qty 6

## 2020-11-07 MED ORDER — FUROSEMIDE 10 MG/ML IJ SOLN: 60.0000 mg | Freq: Two times a day (BID) | INTRAMUSCULAR | Status: DC

## 2020-11-07 MED ORDER — SODIUM CHLORIDE 0.9 % IV SOLN
2.0000 g | INTRAVENOUS | Status: DC
  Administered 2020-11-07: 2 g via INTRAVENOUS
  Filled 2020-11-07: qty 20

## 2020-11-07 MED ORDER — ALBUMIN HUMAN 25 % IV SOLN: 25.0000 g | Freq: Two times a day (BID) | INTRAVENOUS | Status: DC

## 2020-11-07 MED ORDER — ALBUMIN HUMAN 25 % IV SOLN
25.0000 g | Freq: Once | INTRAVENOUS | Status: AC
  Administered 2020-11-07: 25 g via INTRAVENOUS
  Filled 2020-11-07: qty 100

## 2020-11-07 MED ORDER — MENTHOL 3 MG MT LOZG
1.0000 | LOZENGE | OROMUCOSAL | Status: DC | PRN
  Filled 2020-11-07: qty 9

## 2020-11-07 MED ORDER — PREDNISONE 50 MG PO TABS: 60.0000 mg | ORAL_TABLET | Freq: Every day | ORAL | Status: DC

## 2020-11-07 MED ORDER — SODIUM CHLORIDE 0.9 % IV SOLN
100.0000 mg | Freq: Two times a day (BID) | INTRAVENOUS | Status: DC
  Administered 2020-11-07: 100 mg via INTRAVENOUS
  Filled 2020-11-07: qty 100

## 2020-11-07 MED ORDER — PIPERACILLIN-TAZOBACTAM 3.375 G IVPB
3.3750 g | Freq: Three times a day (TID) | INTRAVENOUS | Status: DC
  Administered 2020-11-07 – 2020-11-08 (×3): 3.375 g via INTRAVENOUS
  Filled 2020-11-07 (×3): qty 50

## 2020-11-07 NOTE — Progress Notes (Signed)
This note also relates to the following rows which could not be included: BP - Cannot attach notes to unvalidated device data Pulse Rate - Cannot attach notes to unvalidated device data ECG Heart Rate - Cannot attach notes to unvalidated device data Resp - Cannot attach notes to unvalidated device data SpO2 - Cannot attach notes to unvalidated device data    11/07/20 0600  Assess: MEWS Score  Temp 97.8 F (36.6 C)  Assess: MEWS Score  MEWS Temp 0  MEWS Systolic 0  MEWS Pulse 1  MEWS RR 2  MEWS LOC 0  MEWS Score 3  MEWS Score Color Yellow  Assess: if the MEWS score is Yellow or Red  Were vital signs taken at a resting state? Yes  Focused Assessment No change from prior assessment  Early Detection of Sepsis Score *See Row Information* Low  MEWS guidelines implemented *See Row Information* No, previously yellow, continue vital signs every 4 hours  Treat  MEWS Interventions Administered scheduled meds/treatments  Pain Scale 0-10  Pain Score 0  Take Vital Signs  Increase Vital Sign Frequency  Yellow: Q 2hr X 2 then Q 4hr X 2, if remains yellow, continue Q 4hrs  Escalate  MEWS: Escalate Yellow: discuss with charge nurse/RN and consider discussing with provider and RRT  Notify: Charge Nurse/RN  Name of Charge Nurse/RN Notified Hinton Lovely, RN  Date Charge Nurse/RN Notified 11/07/20  Time Charge Nurse/RN Notified [phone]

## 2020-11-07 NOTE — Progress Notes (Signed)
This ICU Charge RN and Rapid Response RN went to check on patient per request of 2A staff due to declining condition. Patient was resting in bed with his daughters at bedside (initally just one, but the second one arrived during this RN's assessment). Patient is alert though speech difficult to understand (unsure if that is based on BIPAP mask or recent stroke). Patient very thin with barrel chest. Reports not feeling short of breath but observed accessory muscle use and tachypnea with RR in mid to upper 30s. Patient with lots of coarse and fine crackles, Patient 's primary MD already ordered albumin and lasix. Patient's RN was at bedside starting albumin in preparation for lasix. Dr. Mortimer Fries, ICU MD and pulmonologist came to bedside to assess patient and spoke with patient and family. This RN and 2A charge RN present for that discussion. Palliative care consult pending. Difficulty obtaining a pulse ox due to very cold extremities on patient. Warm blankets provided and new clip pulse ox obtained to try and get an accurate pulse ox reading. RT to come back by and titrate down patient's FiO2 as able. Team updated (including primary MD and [facility]).

## 2020-11-07 NOTE — Progress Notes (Signed)
Pt bladder scan showed 135. Notify NP Randol Kern. Will continue to monitor.

## 2020-11-07 NOTE — Progress Notes (Signed)
Tele called to inform me that the pt had six beats of SVT. NP notified and pt is already to get an EKG done later this morning.

## 2020-11-07 NOTE — Progress Notes (Signed)
*  PRELIMINARY RESULTS* Echocardiogram 2D Echocardiogram has been performed.  David Perez 11/07/2020, 12:25 PM

## 2020-11-07 NOTE — Progress Notes (Signed)
NP aware of critical Troponin and elevated BNP level. Per NP, will repeat Troponin in 3 hours.

## 2020-11-07 NOTE — Progress Notes (Addendum)
SLP Cancellation Note  Patient Details Name: David Perez MRN: [id number] DOB: 1941/03/24   Cancelled treatment:       Reason Eval/Treat Not Completed: Medical issues which prohibited therapy;Patient not medically ready (chart reviewed; consulted NSG re: pt's status this AM) Per CT Angio of Chest 11/06/2020 "extensive bilateral emphysematous changes(Extensive severe centrilobular emphysema) along with bilateral interstitial lung disease edema w/ suggested developing CHF/volume overload with pulmonary edema". Pt is also FTT per chart notes w/ recent weight loss.  Pt is currently on BiPAP this morning s/p Rapid Response d/t decline in Pulmonary status. Per chart notes, "patient very thin with barrel chest. Reports not feeling short of breath but observed accessory muscle use and tachypnea with RR in mid to upper 30s. Patient with lots of coarse and fine crackles, Patient 's primary MD already ordered albumin and lasix". ST services will hold on BSE at this time. NSG to contact ST services when pt is able to wean from BiPAP and calmly breath for participation in BSE. Recommend frequent oral care when able for hygiene and stimulation of swallowing.   Orinda Kenner, MS, CCC-SLP Speech Language Pathologist Rehab Services [phone] [facility] 11/07/2020, 10:26 AM

## 2020-11-07 NOTE — Consult Note (Signed)
Name: David Perez MRN: [id number] DOB: 1941/04/10     CONSULTATION DATE: 11/07/2020  REFERRING MD : Roosevelt Locks  CHIEF COMPLAINT: Respiratory distress  STUDIES:  Chest 11/07/2020 extensive bilateral emphysematous changes along with bilateral interstitial lung disease edema versus aspiration pneumonia CT of the chest independently reviewed by me today    HISTORY OF PRESENT ILLNESS:   79 y.o. 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  Patient currently with increased work of breathing respiratory distress placed on BiPAP Daughters at bedside Patient is alert and awake however is struggling to breathe  Patient is critically ill and unable to provide review of systems  Cording to the daughters he has been losing a significant amount of weight over the last 1 month  At this time I do see evidence of hypoxic respiratory failure along with cardiac failure with renal failure with elevated lactic acid consistent with metabolic acidosis  currently on BiPAP   PAST MEDICAL HISTORY :   has a past medical history of CAD (coronary artery disease) (01/03/2020) and Hepatitis B.  has a past surgical history that includes Coronary/Graft Acute MI Revascularization (N/A, 12/31/2019); LEFT HEART CATH AND CORONARY ANGIOGRAPHY (N/A, 12/31/2019); Coronary artery bypass graft (N/A, 12/31/2019); Aortic valve replacement (N/A, 12/31/2019); MAZE (N/A, 12/31/2019); TEE without cardioversion (N/A, 12/31/2019); Clipping of atrial appendage (Left, 12/31/2019); and IR THORACENTESIS ASP PLEURAL SPACE W/IMG GUIDE (01/14/2020). Prior to Admission medications   Medication Sig Start Date  End Date Taking? Authorizing Provider  apixaban (ELIQUIS) 2.5 MG TABS tablet Take 1 tablet (2.5 mg total) by mouth every 12 (twelve) hours. 02/25/20  Yes Clegg, Amy D, NP  atorvastatin (LIPITOR) 80 MG tablet Take 1 tablet (80 mg total) by mouth daily at 6 PM. 02/25/20  Yes Clegg, Amy D, NP  carvedilol (COREG) 6.25 MG tablet Take 1 tablet (6.25 mg total) by mouth 2 (two) times daily. 08/22/20  Yes Larey Dresser, MD  megestrol (MEGACE) 20 MG tablet Take 1 tablet (20 mg total) by mouth daily. 08/15/20  Yes Marval Regal, NP  sacubitril-valsartan (ENTRESTO) 49-51 MG Take 1 tablet by mouth 2 (two) times daily. 09/08/20  Yes Larey Dresser, MD  spironolactone (ALDACTONE) 25 MG tablet Take 1 tablet (25 mg total) by mouth at bedtime. 04/18/20  Yes Clegg, Amy D, NP   No Known Allergies  FAMILY HISTORY:  family history includes Cancer in his father. SOCIAL HISTORY:  reports that he quit smoking about 25 years ago. He has a 20.00 pack-year smoking history. He has never used smokeless tobacco. He reports previous alcohol use. He reports previous drug use.  REVIEW OF SYSTEMS:   Unable to obtain due to critical illness      Estimated body mass index is 22.87 kg/m as calculated from the following:   Height as of this encounter: 5\' 7"  (1.702 m).   Weight as of this encounter: 66.2 kg.    VITAL SIGNS: Temp:  [97.5 F (36.4 C)-99.3 F (37.4 C)] 97.9 F (36.6 C) (12/06 0750) Pulse Rate:  [94-135] 135 (12/06 0828) Resp:  [20-40] 34 (12/06 0750) BP: (86-121)/(58-78) 121/78 (12/06 0750) SpO2:  [71 %-96 %] 96 % (12/06 0828) Weight:  [66.2 kg] 66.2 kg (12/06 0552)   I/O last 3 completed  shifts: In: 140 [P.O.:140] Out: 725 [Urine:725] No intake/output data recorded.   SpO2: 96 % O2 Flow Rate (L/min): 15 L/min   Physical Examination:  GENERAL:critically ill appearing, +resp distress HEAD: Normocephalic, atraumatic.  EYES: Pupils equal, round, reactive to light.  No scleral icterus.   MOUTH: Moist mucosal membrane. NECK: Supple. No JVD.  PULMONARY: +rhonchi, +wheezing CARDIOVASCULAR: S1 and S2. Regular rate and rhythm. No murmurs, rubs, or gallops.  GASTROINTESTINAL: Soft, nontender, -distended.  Positive bowel sounds.  MUSCULOSKELETAL: No swelling, clubbing, or edema.  NEUROLOGIC: Alert and awake SKIN:intact,warm,dry    MEDICATIONS: I have reviewed all medications and confirmed regimen as documented   CULTURE RESULTS   Recent Results (from the past 240 hour(s))  Resp Panel by RT-PCR (Flu A&B, Covid) Nasopharyngeal Swab     Status: None   Collection Time: 11/23/2020 10:03 PM   Specimen: Nasopharyngeal Swab; Nasopharyngeal(NP) swabs in vial transport medium  Result Value Ref Range Status   SARS Coronavirus 2 by RT PCR NEGATIVE NEGATIVE Final    Comment: (NOTE) SARS-CoV-2 target nucleic acids are NOT DETECTED.  The SARS-CoV-2 RNA is generally detectable in upper respiratory specimens during the acute phase of infection. The lowest concentration of SARS-CoV-2 viral copies this assay can detect is 138 copies/mL. A negative result does not preclude SARS-Cov-2 infection and should not be used as the sole basis for treatment or other patient management decisions. A negative result may occur with  improper specimen collection/handling, submission of specimen other than nasopharyngeal swab, presence of viral mutation(s) within the areas targeted by this assay, and inadequate number of viral copies(<138 copies/mL). A negative result must be combined with clinical observations, patient history, and epidemiological information. The expected result is Negative.  Fact Sheet for Patients:  EntrepreneurPulse.com.au  Fact Sheet for Healthcare Providers:  IncredibleEmployment.be  This test is no t yet approved or cleared by the [place] FDA and  has been authorized for detection and/or diagnosis of SARS-CoV-2 by FDA under an  Emergency Use Authorization (EUA). This EUA will remain  in effect (meaning this test can be used) for the duration of the COVID-19 declaration under Section 564(b)(1) of the Act, 21 U.S.C.section 360bbb-3(b)(1), unless the authorization is terminated  or revoked sooner.       Influenza A by PCR NEGATIVE NEGATIVE Final   Influenza B by PCR NEGATIVE NEGATIVE Final    Comment: (NOTE) The Xpert Xpress SARS-CoV-2/FLU/RSV plus assay is intended as an aid in the diagnosis of influenza from Nasopharyngeal swab specimens and should not be used as a sole basis for treatment. Nasal washings and aspirates are unacceptable for Xpert Xpress SARS-CoV-2/FLU/RSV testing.  Fact Sheet for Patients: EntrepreneurPulse.com.au  Fact Sheet for Healthcare Providers: IncredibleEmployment.be  This test is not yet approved or cleared by the [place] FDA and has been authorized for detection and/or diagnosis of SARS-CoV-2 by FDA under an Emergency Use Authorization (EUA). This EUA will remain in effect (meaning this test can be used) for the duration of the COVID-19 declaration under Section 564(b)(1) of the Act, 21 U.S.C. section 360bbb-3(b)(1), unless the authorization is terminated or revoked.  Performed at [facility], [address] [place], [place] [address]   CULTURE, BLOOD (ROUTINE X 2) w Reflex to ID Panel     Status: None (Preliminary result)   Collection Time: 11/07/20  6:29 AM   Specimen: BLOOD  Result Value Ref Range Status   Specimen Description BLOOD BLOOD RIGHT HAND  Final   Special Requests  Final    BOTTLES DRAWN AEROBIC AND ANAEROBIC Blood Culture results may not be optimal due to an inadequate volume of blood received in culture bottles   Culture   Final    NO GROWTH <12 HOURS Performed at [facility], [address] [place], [place] [address]    Report Status PENDING  Incomplete  CULTURE, BLOOD (ROUTINE X 2) w  Reflex to ID Panel     Status: None (Preliminary result)   Collection Time: 11/07/20  6:29 AM   Specimen: BLOOD  Result Value Ref Range Status   Specimen Description BLOOD FINGER  Final   Special Requests   Final    BOTTLES DRAWN AEROBIC AND ANAEROBIC Blood Culture adequate volume   Culture   Final    NO GROWTH <12 HOURS Performed at [facility], [address], [place], [place] [address]    Report Status PENDING  Incomplete          IMAGING    CT ANGIO CHEST PE W OR WO CONTRAST  Result Date: 11/06/2020 CLINICAL DATA:  PE suspected, positive D-dimer EXAM: CT ANGIOGRAPHY CHEST WITH CONTRAST TECHNIQUE: Multidetector CT imaging of the chest was performed using the standard protocol during bolus administration of intravenous contrast. Multiplanar CT image reconstructions and MIPs were obtained to evaluate the vascular anatomy. CONTRAST:  58mL OMNIPAQUE IOHEXOL 350 MG/ML SOLN COMPARISON:  Radiograph 11/03/2020, CT 12/30/2018 FINDINGS: Cardiovascular: Satisfactory opacification the pulmonary arteries to the segmental level. No pulmonary artery filling defects are identified. Central pulmonary arteries are normal caliber. Borderline cardiomegaly. Calcification of the native coronaries. 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 No pericardial effusion. Atherosclerotic plaque within the normal caliber aorta. No visible acute luminal abnormality of the imaged aorta. No periaortic stranding or hemorrhage. Reflux of contrast into the hepatic veins and IVC. Mediastinum/Nodes: Scattered mediastinal and hilar nodes are present, several increasing slightly in prominence from comparison studies, largest include a left paratracheal node measuring 11 mm (5/35) and an 11 mm subcarinal node (5/46). Worrisome axillary adenopathy. No acute abnormality of the thoracic esophagus. Coronal narrowing of the trachea may reflect some  chronic obstructive pulmonary lung disease. No other acute abnormality is seen. Lungs/Pleura: Diffuse centrilobular emphysema. Interlobular septal thickening is present as well as some developing hazy ground-glass and more consolidative opacity in mid to lower lung predominance. Vascular redistribution is seen. A more focal region of opacity with some adjacent pleural tenting/architectural distortion is seen in the lateral aspect of the left upper lobe/superior lingula (5/47) small bilateral effusions with adjacent passive atelectasis. A similar area is also present in the lateral segment left lower lobe as well (7/74). No pneumothorax. Upper Abdomen: Atherosclerotic calcifications in the upper abdomen. Reflux of contrast into the hepatic veins and IVC, as above. No acute abnormalities present in the visualized portions of the upper abdomen. Musculoskeletal: The osseous structures appear diffusely demineralized which may limit detection of small or nondisplaced fractures. Exaggerated thoracic kyphosis is similar to prior with some chronic likely degenerative vertebral body height loss. Increased diameter of chest is also unchanged. Prior sternotomy without acute complication. Paucity of subcutaneous fat. Mild diffuse body wall edema. No focal chest wall lesions. Review of the MIP images confirms the above findings. IMPRESSION: 1. No evidence of acute pulmonary artery embolism. 2. Extensive severe centrilobular emphysema. 3. Interspersed areas of ground-glass opacity, septal thickening and vascular redistribution suggest developing CHF/volume overload with pulmonary edema. 4. More focal opacities in  the periphery of the left upper lobe/lingula and left lower lobe, could reflect regions of scarring and architectural distortion with atelectatic collapse and edema though underlying airspace disease/infection not excluded. Furthermore, should pursue follow-up imaging post therapeutic intervention to ensure resolution. 5.  Scattered mediastinal and hilar nodes, several increasing slightly in prominence from comparison studies, possibly reactive. 6. 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 7. Aortic Atherosclerosis (ICD10-I70.0). Electronically Signed   By: Lovena Le M.D.   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  Continue BiPAP as prescribed Wean off as tolerated We will provide morphine as needed  At this time patient with multiorgan failure I had a long conversation with 2 daughters at bedside I have explained to them patient is increased work of breathing and is struggling to breathe on BiPAP however he is alert and awake but unable to comprehend what he is saying  Patient is a DNR/DNI At this time will provide morphine as needed as explained to the patient's family the 2 daughters at bedside  They do not want patient suffering anymore  GOALS OF CARE DISCUSSION  The Clinical status was relayed to family in detail 2 daughters .  Updated and notified of patients medical condition.   patient with increased WOB and using accessory muscles to breathe Explained to family course of therapy and the modalities     Patient with Progressive multiorgan failure with very low chance of meaningful recovery despite all aggressive and optimal medical therapy.   Family understands  the situation.  They have consented and agreed to DNR/DNI Will start with Morphine as needed and assess. Palliative care team to assess patient.     Family are satisfied with Plan of action and management. All questions answered     Critical Care Time devoted to patient care services described in this note is 55 minutes.   Overall, patient is critically ill, prognosis is guarded.  Patient with Multiorgan failure and at high risk for cardiac arrest and death.    Corrin Parker, M.D.  [facility]  Medical Director [place] Director [facility] Cardio-Pulmonary Department

## 2020-11-07 NOTE — Consult Note (Signed)
Consultation Note Date: 11/07/2020   Patient Name: David Perez  DOB: 1941-02-26  MRN: [id number]  Age / Sex: 79 y.o., male  PCP: Marval Regal, NP Referring Physician: Sharen Hones, MD  Reason for Consultation: Establishing goals of care  HPI/Patient Profile: 79 y.o. 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 Family reports fall and weight loss. 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 On 12/6, patient developed severe hypoxemia, lactic acidosis. PCCM consulted. Chest CT reveals extensive bilateral emphysematous changes along with bilateral interstitial lung disease versus edema versus aspiration pneumonia. Dr. Mortimer Fries spoke with daughters. Patient placed on BiPAP. DNR/DNI and no plans for transfer to ICU. IV morphine as needed. Palliative medicine consultation for goals of care/terminal care.    Clinical Assessment and Goals of Care: Reviewed medical records, discussed with care team, and met with patient and daughter Langley Gauss) at bedside. Patient is awake on BiPAP. He is attempting to communicate and asking for water. He does not appear to be in pain or discomfort.   Unfortunately, Denise's sister [name]) has left the hospital for the evening.   Denise and I reviewed her father's condition including diagnoses, interventions, plan of care.   Langley Gauss and I called sister, [name] via telephone and again discussed diagnoses, interventions, plan of care, poor prognosis, and events from today. Langley Gauss and [place] confirm the patient's decision for DNR/DNI code status. He would not wish for prolonged extraordinary measures. Discussed that BiPAP can be considered a form of non-invasive  life support.   Discussed concern with irreversible conditions and he may decline quickly when Bipap is removed. Explained that we would need to be prepared for anything to happen at any time, of course ensuring he is comfortable. Daughters share they do not wish to see him suffer.   It is important for both daughters to be present before a transition to comfort focused pathway. Confirmed DNR/DNI, no escalation of care but ongoing BiPAP over night. Daughters and I plan to meet tomorrow 12/7 at 0830 for [place] discussion. Daughters ok with prn ativan and morphine for comfort and symptoms.    The patient has continued to ask Langley Gauss for food this afternoon. We did discuss aspiration risk currently being on BiPAP but plan for frequent oral care. Discussed comfort feeds when we can get him off the BiPAP, hopefully tomorrow morning.   Answered questions. [facility] Cho[facility] and PMT contact information given.    SUMMARY OF RECOMMENDATIONS    DNR/DNI  No escalation of care. No transfer to ICU.  Continue medical management and current plan of care including BiPAP over night. It is important for both daughters to be present prior to a transition to comfort measures.   Daughters agree with prn ativan and morphine for comfort and to ensure he does not suffer.   F/u [place] meeting with both daughters 12/7 at  0830.  Continue frequent oral care and repositioning.   Code Status/Advance Care Planning:  DNR/DNI: confirmed by both daughters [name] and [place])  Symptom Management:   Morphine 2-$RemoveBef'4mg'yLsHvWnYpn$  IV q2h prn pain/dyspnea/air hunger/tachypnea  Ativan $Remov'1mg'rTGDwr$  IV q6h prn anxiety/agitation  Palliative Prophylaxis:   Aspiration, Delirium Protocol, Frequent Pain Assessment, Oral Care and Turn Reposition  Psycho-social/Spiritual:   Desire for further Chaplaincy support: yes  Additional Recommendations: Caregiving  Support/Resources, Compassionate Wean Education and Education on Hospice  Prognosis:   Poor  prognosis with multiorgan failure  Discharge Planning: To Be Determined      Primary Diagnoses: Present on Admission: . AKI (acute kidney injury) ([place]) . Macrocytic anemia . Debility . Weight loss . Hypotension due to hypovolemia . Protein malnutrition ([place]) . Hypotension . Moderate pulmonary hypertension ([place]) . Severe protein-calorie malnutrition [place]: less than 60% of standard weight) ([place])   I have reviewed the medical record, interviewed the patient and family, and examined the patient. The following aspects are pertinent.  Past Medical History:  Diagnosis Date  . 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 Hepatitis B    Social History   Socioeconomic History  . Marital status: Widowed    Spouse name: Not on file  . Number of children: Not on file  . Years of education: Not on file  . Highest education level: Not on file  Occupational History  . Not on file  Tobacco Use  . Smoking status: Former Smoker    Packs/day: 0.50    Years: 40.00    Pack years: 20.00    Quit date: 1996    Years since quitting: 25.9  . Smokeless tobacco: Never Used  Vaping Use  . Vaping Use: Never used  Substance and Sexual Activity  . Alcohol use: Not Currently  . Drug use: Not Currently  . Sexual activity: Not Currently  Other Topics Concern  . Not on file  Social History Narrative  . Not on file   Social Determinants of Health   Financial Resource Strain: Low Risk   . Difficulty of Paying Living Expenses: Not hard at all  Food Insecurity: No Food Insecurity  . Worried About Charity fundraiser in the Last Year: Never true  . Ran Out of Food in the Last Year: Never true  Transportation Needs: No Transportation Needs  . Lack of Transportation (Medical): No  . Lack of Transportation (Non-Medical): No  Physical Activity: Sufficiently Active  . Days of Exercise per Week: 3 days  . Minutes of Exercise per Session: 60 min  Stress: No Stress Concern Present  .  Feeling of Stress : Not at all  Social Connections: Unknown  . Frequency of Communication with Friends and Family: More than three times a week  . Frequency of Social Gatherings with Friends and Family: Three times a week  . Attends Religious Services: Not on file  . Active Member of Clubs or Organizations: Not on file  . Attends Archivist Meetings: Not on file  . Marital Status: Widowed   Family History  Problem Relation Age of Onset  . Cancer Father    Scheduled Meds: . apixaban  2.5 mg Oral Q12H  . feeding supplement  1 Container Oral Q24H  . feeding supplement  237 mL Oral BID BM  . furosemide  60 mg Intravenous BID  . megestrol  20 mg Oral Daily  . methylPREDNISolone (SOLU-MEDROL) injection  40 mg Intravenous Q12H  . midodrine  5  mg Oral TID WC  . [START ON 11/08/2020] predniSONE  60 mg Oral Q breakfast   Continuous Infusions: . albumin human 25 g (11/07/20 0904)  . piperacillin-tazobactam (ZOSYN)  IV 3.375 g (11/07/20 1605)   PRN Meds:.acetaminophen **OR** acetaminophen, LORazepam, morphine injection, ondansetron **OR** ondansetron (ZOFRAN) IV Medications Prior to Admission:  Prior to Admission medications   Medication Sig Start Date End Date Taking? Authorizing Provider  apixaban (ELIQUIS) 2.5 MG TABS tablet Take 1 tablet (2.5 mg total) by mouth every 12 (twelve) hours. 02/25/20  Yes Clegg, Amy D, NP  atorvastatin (LIPITOR) 80 MG tablet Take 1 tablet (80 mg total) by mouth daily at 6 PM. 02/25/20  Yes Clegg, Amy D, NP  carvedilol (COREG) 6.25 MG tablet Take 1 tablet (6.25 mg total) by mouth 2 (two) times daily. 08/22/20  Yes Laurey Morale, MD  megestrol (MEGACE) 20 MG tablet Take 1 tablet (20 mg total) by mouth daily. 08/15/20  Yes Theadore Nan, NP  sacubitril-valsartan (ENTRESTO) 49-51 MG Take 1 tablet by mouth 2 (two) times daily. 09/08/20  Yes Laurey Morale, MD  spironolactone (ALDACTONE) 25 MG tablet Take 1 tablet (25 mg total) by mouth at bedtime.  04/18/20  Yes Clegg, Amy D, NP   No Known Allergies Review of Systems  Unable to perform ROS: Acuity of condition    Physical Exam Vitals and nursing note reviewed.  Constitutional:      Appearance: He is cachectic. He is ill-appearing.  HENT:     Head: Normocephalic and atraumatic.  Pulmonary:     Effort: No tachypnea, accessory muscle usage or respiratory distress.     Breath sounds: Decreased breath sounds present.     Comments: BiPAP 60% Skin:    General: Skin is warm and dry.     Coloration: Skin is pale.  Neurological:     Mental Status: He is easily aroused.     Comments: Awake, ? Orientation since on BiPAP. Follows simple commands. Per daughter at bedside, intermittent confusion  Psychiatric:        Attention and Perception: He is inattentive.    Vital Signs: BP (!) 94/54   Pulse (!) 101   Temp 98.1 F (36.7 C) (Oral)   Resp (!) 26   Ht 5\' 7"  (1.702 m)   Wt 66.2 kg   SpO2 94%   BMI 22.87 kg/m  Pain Scale: 0-10 POSS *See Group Information*: S-Acceptable,Sleep, easy to arouse Pain Score: 0-No pain   SpO2: SpO2: 94 % O2 Device:SpO2: 94 % O2 Flow Rate: .O2 Flow Rate (L/min): 15 L/min  IO: Intake/output summary:   Intake/Output Summary (Last 24 hours) at 11/07/2020 1644 Last data filed at 11/07/2020 1401 Gross per 24 hour  Intake 0 ml  Output 1400 ml  Net -1400 ml    LBM: Last BM Date: 11/06/20 Baseline Weight: Weight: 45.8 kg Most recent weight: Weight: 66.2 kg     Palliative Assessment/Data: PPS 30%   Flowsheet Rows     Most Recent Value  Intake Tab  Referral Department Hospitalist  Unit at Time of Referral Cardiac/Telemetry Unit  Palliative Care Primary Diagnosis Cardiac  Palliative Care Type New Palliative care  Reason for referral Clarify Goals of Care  Date first seen by Palliative Care 11/07/20  Clinical Assessment  Palliative Performance Scale Score 30%  Psychosocial & Spiritual Assessment  Palliative Care Outcomes  Patient/Family  meeting held? Yes  Who was at the meeting? daughter at bedside, daughter on speaker phone  Palliative  Care Outcomes Clarified goals of care, Improved pain interventions, Improved non-pain symptom therapy, Provided end of life care assistance, Provided psychosocial or spiritual support, ACP counseling assistance      Time Total: 60 Greater than 50%  of this time was spent counseling and coordinating care related to the above assessment and plan.  Signed by:  Ihor Dow, DNP, FNP-C Palliative Medicine Team  Phone: [phone] Fax: [phone]   Please contact Palliative Medicine Team phone at [phone] for questions and concerns.  For individual provider: See Shea Evans

## 2020-11-07 NOTE — Progress Notes (Signed)
RN states increased o2 due to patient had take o2 off and stats were low. Patient with saturation 91 then 99. Left him there for now. Will continue to monitor

## 2020-11-07 NOTE — Progress Notes (Signed)
RT NOTE: Patient was placed on high flow bubble due to fluid overload at 15 liters. Highest o2 saturation noted at 71% per monitor. Reached out to NP who ordered bipap. Bipap started at 10/5 r8 100% patient tolerated therapy very well. o2 saturation immediately increased to 95% however remains sob. Will continue to monitor. Have updated NP on progress.

## 2020-11-07 NOTE — Progress Notes (Signed)
Lab called and reported a lactic of 7.6. Notify NP Randol Kern. Will continue to monitor.

## 2020-11-07 NOTE — Progress Notes (Signed)
PROGRESS NOTE    David Perez  LTJ:[id number] DOB: [birth year] DOA: 11/28/2020 PCP: Marval Regal, NP   Chief Complaint.  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 He was sent from hematology clinic with hypotension. Discussed with patient daughter, patient had a fall about 5 days ago. He has generalized weakness. He occasionally confused. He has a poor p.o. intake, he has lost 20 pounds of body weight. He also has some loose stools. He currently lives alone at home. When I see the patient today, he appeared to have some confusion. Speech is slurred. But no weakness in the legs or arms. No facial droop. He had a CT scan of abdomen/pelvis in the emergency room, did not see any acute changes.Hb7.7. 12/5.  MRI of the brain showed acute stroke in the right parietal lobe and the left cerebellar. 12/6.  Patient developed severe hypoxemia, lactic acidosis, consult intensive care.   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  Acute hypoxemic respiratory failure. Likely aspiration pneumonia.  Patient might be aspirated last night.   Acute on chronic systolic congestive heart failure. Severe sepsis with a severe lactic acidosis. Patient develop severe hypoxemia overnight.   Currently on BiPAP with 100% oxygen.  Dr. Mortimer Fries will see the patient to determine if patient need to be transferred to ICU. Patient also has severe lactic acidosis with a lactic level more than 7.  This is a combination of sepsis and congestive heart failure with poor perfusion. Not able to give her fluids due to volume overload.  IV Lasix started last night.  Will monitor blood pressure closely, will need pressor if blood pressure drops with Lasix Antibiotics were changed to Zosyn to cover for aspiration pathogen. N.p.o.   #2. Acute embolic stroke. Paroxysmal atrial fibrillation. Acute metabolic encephalopathy. Appreciate neurology consult. Further work-up has not been possible due to deterioration of patient condition.   #3.  Hypotension. Blood pressure is better.  #4.  Severe protein calorie malnutrition. We will follow.  Currently n.p.o.  5.  Anemia of chronic disease. Follow, transfuse as needed.  6.  Acute kidney injury. Secondary to sepsis, congestive heart failure and diuretics.  Follow.  Discussed with daughter, patient prognosis is guarded to poor.  High risk of mortality.    DVT prophylaxis: eliquis Code Status: dnr Family Communication: Daughter updated Disposition Plan:  .   Status is: Inpatient  Remains inpatient appropriate because:Inpatient level of care appropriate due to severity of illness   Dispo: The patient is from: Home              Anticipated d/c is to: ?              Anticipated d/c date is: > 3 days  Patient currently is not medically stable to d/c.        I/O last 3 completed shifts: In: 140 [P.O.:140] Out: 425 [Urine:425] No intake/output data recorded.     Consultants:   ICU  Procedures: None  Antimicrobials: Zosyn  Subjective: Patient developed significant hypoxemia last night.  He is placed on 100% oxygen on BiPAP this morning. Currently, patient is comfortable on BiPAP. No fever or chills. No abdominal  pain or nausea vomiting.  No diarrhea.   Objective: Vitals:   11/07/20 0340 11/07/20 0550 11/07/20 0552 11/07/20 0600  BP: 105/65     Pulse: (!) 108     Resp: (!) 32     Temp: 98.7 F (37.1 C) 97.8 F (36.6 C)  97.8 F (36.6 C)  TempSrc:  Axillary  Oral  SpO2: 92%     Weight:   66.2 kg   Height:        Intake/Output Summary (Last 24 hours) at 11/07/2020 0731 Last data filed at 11/07/2020 0046 Gross per 24 hour  Intake 0 ml  Output 425 ml  Net -425 ml   Filed Weights   11/27/2020 1350 11/07/20 0552  Weight: 45.8 kg 66.2 kg    Examination:  General exam: Acutely ill.  Respiratory system: Crackles in the bas. Respiratory effort normal. Cardiovascular system: S1 & S2 heard, RRR. No JVD, murmurs, rubs, gallops or clicks. No pedal edema. Gastrointestinal system: Abdomen is nondistended, soft and nontender. No organomegaly or masses felt. Normal bowel sounds heard. Central nervous system: Alert and oriented x2.  Extremities: Symmetric 5 x 5 power. Skin: No rashes, lesions or ulcers     Data Reviewed: I have personally reviewed following labs and imaging studies  CBC: Recent Labs  Lab 11/02/20 1131 11/28/2020 1358 11/05/20 0553 11/06/20 0515 11/07/20 0303  WBC 8.4 6.5 6.8 9.0 16.1*  NEUTROABS 7.0  --   --  7.3 12.9*  HGB 8.1* 8.6* 7.7* 8.2* 9.1*  HCT 24.8* 26.2* 23.1* 24.3* 28.6*  MCV 109.7* 109.2* 109.5* 105.7* 112.2*  PLT 294 311 275 278 725   Basic Metabolic Panel: Recent Labs  Lab 11/02/20 1131 11/13/2020 1358 11/05/20 0553 11/07/20 0303  NA 140 138 138 137  K 4.3 4.6 3.7 4.4  CL 105 104 110 109  CO2 26 25 22  13*  GLUCOSE 171* 124* 94 303*  BUN 32* 28* 22 27*  CREATININE 1.09 1.35* 1.00 1.42*  CALCIUM 9.2 9.4 7.7* 8.2*  MG  --   --   --  2.2   GFR: Estimated Creatinine Clearance: 38.1 mL/min (A) (by C-G formula based on SCr of 1.42 mg/dL (H)). Liver Function Tests: Recent Labs  Lab 11/02/20 1131 11/07/20 0303  AST 67* 42*  ALT 103* 49*   ALKPHOS 75 67  BILITOT 0.9 1.3*  PROT 6.9 6.8  ALBUMIN 2.4* 3.0*   No results for input(s): LIPASE, AMYLASE in the last 168 hours. No results for input(s): AMMONIA in the last 168 hours. Coagulation Profile: No results for input(s): INR, PROTIME in the last 168 hours. Cardiac Enzymes: No results for input(s): CKTOTAL, CKMB, CKMBINDEX, TROPONINI in the last 168 hours. BNP (last 3 results) No results for input(s): PROBNP in the last 8760 hours. HbA1C: No results for input(s): HGBA1C in the last 72 hours. CBG: Recent Labs  Lab 11/05/20 2347  GLUCAP 150*   Lipid Profile: No results for input(s): CHOL, HDL, LDLCALC, TRIG, CHOLHDL, LDLDIRECT in the last 72 hours. Thyroid Function Tests: Recent Labs  11/06/20 0515  TSH 3.289   Anemia Panel: Recent Labs    11/05/20 2008  VITAMINB12 906  TIBC 151*  IRON 27*   Sepsis Labs: Recent Labs  Lab 11/06/20 2342 11/07/20 0629  PROCALCITON 2.79  --   LATICACIDVEN  --  7.6*    Recent Results (from the past 240 hour(s))  Resp Panel by RT-PCR (Flu A&B, Covid) Nasopharyngeal Swab     Status: None   Collection Time: 11/19/2020 10:03 PM   Specimen: Nasopharyngeal Swab; Nasopharyngeal(NP) swabs in vial transport medium  Result Value Ref Range Status   SARS Coronavirus 2 by RT PCR NEGATIVE NEGATIVE Final    Comment: (NOTE) SARS-CoV-2 target nucleic acids are NOT DETECTED.  The SARS-CoV-2 RNA is generally detectable in upper respiratory specimens during the acute phase of infection. The lowest concentration of SARS-CoV-2 viral copies this assay can detect is 138 copies/mL. A negative result does not preclude SARS-Cov-2 infection and should not be used as the sole basis for treatment or other patient management decisions. A negative result may occur with  improper specimen collection/handling, submission of specimen other than nasopharyngeal swab, presence of viral mutation(s) within the areas targeted by this assay, and inadequate  number of viral copies(<138 copies/mL). A negative result must be combined with clinical observations, patient history, and epidemiological information. The expected result is Negative.  Fact Sheet for Patients:  EntrepreneurPulse.com.au  Fact Sheet for Healthcare Providers:  IncredibleEmployment.be  This test is no t yet approved or cleared by the [place] FDA and  has been authorized for detection and/or diagnosis of SARS-CoV-2 by FDA under an Emergency Use Authorization (EUA). This EUA will remain  in effect (meaning this test can be used) for the duration of the COVID-19 declaration under Section 564(b)(1) of the Act, 21 U.S.C.section 360bbb-3(b)(1), unless the authorization is terminated  or revoked sooner.       Influenza A by PCR NEGATIVE NEGATIVE Final   Influenza B by PCR NEGATIVE NEGATIVE Final    Comment: (NOTE) The Xpert Xpress SARS-CoV-2/FLU/RSV plus assay is intended as an aid in the diagnosis of influenza from Nasopharyngeal swab specimens and should not be used as a sole basis for treatment. Nasal washings and aspirates are unacceptable for Xpert Xpress SARS-CoV-2/FLU/RSV testing.  Fact Sheet for Patients: EntrepreneurPulse.com.au  Fact Sheet for Healthcare Providers: IncredibleEmployment.be  This test is not yet approved or cleared by the [place] FDA and has been authorized for detection and/or diagnosis of SARS-CoV-2 by FDA under an Emergency Use Authorization (EUA). This EUA will remain in effect (meaning this test can be used) for the duration of the COVID-19 declaration under Section 564(b)(1) of the Act, 21 U.S.C. section 360bbb-3(b)(1), unless the authorization is terminated or revoked.  Performed at [facility], [address], [place], [place] [address]          Radiology Studies: CT HEAD WO CONTRAST  Result Date: 11/05/2020 CLINICAL DATA:   Altered level of consciousness, hypotension EXAM: CT HEAD WITHOUT CONTRAST TECHNIQUE: Contiguous axial images were obtained from the base of the skull through the vertex without intravenous contrast. COMPARISON:  None. FINDINGS: Brain: Hypodensities are seen throughout the periventricular white matter and bilateral basal ganglia, most compatible with chronic small vessel ischemic change. No other signs of acute infarct or hemorrhage. The lateral ventricles and remaining midline structures are unremarkable. Age-appropriate cerebral atrophy. No acute extra-axial fluid collections. No mass effect. Vascular: No hyperdense vessel or unexpected calcification. Skull: Normal. Negative for fracture or focal  lesion. Sinuses/Orbits: No acute finding. Other: None. IMPRESSION: 1. Chronic appearing small vessel ischemic changes throughout the periventricular white matter and basal ganglia. 2. No acute intracranial process. Electronically Signed   By: Randa Ngo M.D.   On: 11/05/2020 15:21   CT ANGIO CHEST PE W OR WO CONTRAST  Result Date: 11/06/2020 CLINICAL DATA:  PE suspected, positive D-dimer EXAM: CT ANGIOGRAPHY CHEST WITH CONTRAST TECHNIQUE: Multidetector CT imaging of the chest was performed using the standard protocol during bolus administration of intravenous contrast. Multiplanar CT image reconstructions and MIPs were obtained to evaluate the vascular anatomy. CONTRAST:  46mL OMNIPAQUE IOHEXOL 350 MG/ML SOLN COMPARISON:  Radiograph 11/07/2020, CT 12/30/2018 FINDINGS: Cardiovascular: Satisfactory opacification the pulmonary arteries to the segmental level. No pulmonary artery filling defects are identified. Central pulmonary arteries are normal caliber. Borderline cardiomegaly. Calcification of the native coronaries. 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 No pericardial effusion. Atherosclerotic plaque within the normal caliber  aorta. No visible acute luminal abnormality of the imaged aorta. No periaortic stranding or hemorrhage. Reflux of contrast into the hepatic veins and IVC. Mediastinum/Nodes: Scattered mediastinal and hilar nodes are present, several increasing slightly in prominence from comparison studies, largest include a left paratracheal node measuring 11 mm (5/35) and an 11 mm subcarinal node (5/46). Worrisome axillary adenopathy. No acute abnormality of the thoracic esophagus. Coronal narrowing of the trachea may reflect some chronic obstructive pulmonary lung disease. No other acute abnormality is seen. Lungs/Pleura: Diffuse centrilobular emphysema. Interlobular septal thickening is present as well as some developing hazy ground-glass and more consolidative opacity in mid to lower lung predominance. Vascular redistribution is seen. A more focal region of opacity with some adjacent pleural tenting/architectural distortion is seen in the lateral aspect of the left upper lobe/superior lingula (5/47) small bilateral effusions with adjacent passive atelectasis. A similar area is also present in the lateral segment left lower lobe as well (7/74). No pneumothorax. Upper Abdomen: Atherosclerotic calcifications in the upper abdomen. Reflux of contrast into the hepatic veins and IVC, as above. No acute abnormalities present in the visualized portions of the upper abdomen. Musculoskeletal: The osseous structures appear diffusely demineralized which may limit detection of small or nondisplaced fractures. Exaggerated thoracic kyphosis is similar to prior with some chronic likely degenerative vertebral body height loss. Increased diameter of chest is also unchanged. Prior sternotomy without acute complication. Paucity of subcutaneous fat. Mild diffuse body wall edema. No focal chest wall lesions. Review of the MIP images confirms the above findings. IMPRESSION: 1. No evidence of acute pulmonary artery embolism. 2. Extensive severe  centrilobular emphysema. 3. Interspersed areas of ground-glass opacity, septal thickening and vascular redistribution suggest developing CHF/volume overload with pulmonary edema. 4. More focal opacities in the periphery of the left upper lobe/lingula and left lower lobe, could reflect regions of scarring and architectural distortion with atelectatic collapse and edema though underlying airspace disease/infection not excluded. Furthermore, should pursue follow-up imaging post therapeutic intervention to ensure resolution. 5. Scattered mediastinal and hilar nodes, several increasing slightly in prominence from comparison studies, possibly reactive. 6. 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 7. Aortic Atherosclerosis (ICD10-I70.0). Electronically Signed   By: Lovena Le M.D.   On: 11/06/2020 22:38   MR BRAIN WO CONTRAST  Result Date: 11/05/2020 CLINICAL DATA:  Neuro deficit, acute, stroke suspected. Additional provided: Generalized weakness with occasional confusion. Slurred speech. EXAM: MRI HEAD WITHOUT CONTRAST TECHNIQUE: Multiplanar, multiecho pulse sequences of the  brain and surrounding structures were obtained without intravenous contrast. COMPARISON:  Head CT 11/05/2020. FINDINGS: Brain: The patient was unable to tolerate the full examination. As a result, only axial and coronal diffusion-weighted sequences, a sagittal T1 weighted sequence and an axial T2 weighted sequence could be obtained. The axial diffusion-weighted sequence is moderately motion degraded. The sagittal T1 weighted sequence is severely motion degraded. The axial T2 weighted sequence is moderate to severely motion degraded. Mild generalized cerebral atrophy. 3 mm focus of restricted diffusion compatible with acute infarction within the posterior right frontal lobe (motor strip) (series 2, image 85) (series 4, image 58). A punctate acute infarct is also questioned within the left cerebellar  hemisphere (series 4, image 65). Advanced multifocal T2/FLAIR hyperintensity within the cerebral white matter which is nonspecific, but compatible chronic small vessel ischemic disease. Within the limitations of motion degradation, there is no appreciable intracranial mass or extra-axial fluid collection. No midline shift Vascular: Expected proximal arterial flow voids. Skull and upper cervical spine: No focal marrow lesion is identified. Sinuses/Orbits: Visualized orbits show no acute finding. Mild ethmoid sinus mucosal thickening. IMPRESSION: Prematurely terminated and significantly motion degraded examination, as described and limiting evaluation. 3 mm acute infarction within the posterior right frontal lobe (motor strip). Possible additional punctate acute infarct within the left cerebellar hemisphere. Mild cerebral atrophy and advanced chronic small vessel ischemic disease. Electronically Signed   By: Kellie Simmering DO   On: 11/05/2020 19:43        Scheduled Meds: . apixaban  2.5 mg Oral Q12H  . atorvastatin  80 mg Oral q1800  . feeding supplement  1 Container Oral Q24H  . feeding supplement  237 mL Oral BID BM  . furosemide  60 mg Intravenous Q12H  . megestrol  20 mg Oral Daily  . methylPREDNISolone (SOLU-MEDROL) injection  40 mg Intravenous Q12H  . midodrine  5 mg Oral TID WC  . [START ON 11/08/2020] predniSONE  60 mg Oral Q breakfast   Continuous Infusions: . albumin human    . piperacillin-tazobactam (ZOSYN)  IV       LOS: 2 days    Time spent: 6 minutes    Sharen Hones, MD [facility]   To contact the attending provider between 7A-7P or the covering provider during after hours 7P-7A, please log into the web site [email or web address] and access using universal [place] password for that web site. If you do not have the password, please call the hospital operator.  11/07/2020, 7:31 AM

## 2020-11-07 NOTE — Progress Notes (Signed)
OT Cancellation Note  Patient Details Name: David Perez MRN: [id number] DOB: 03-14-1941   Cancelled Treatment:  Reason Eval/Treat Not Completed: Patient not medically ready. Per chart review pt noted to have transitioned to higher level of care. Currently requiring bipap and pending palliative consult. Per therapy protocols, will require new orders or continue at transfer to initiate therapy services. Will follow acutely and initiate services as pt medically appropriate.  Dessie Coma, M.S. OTR/L  11/07/20, 1:18 PM  ascom [phone]

## 2020-11-08 DIAGNOSIS — R0600 Dyspnea, unspecified: Secondary | ICD-10-CM

## 2020-11-08 DIAGNOSIS — F411 Generalized anxiety disorder: Secondary | ICD-10-CM

## 2020-11-08 LAB — CBC WITH DIFFERENTIAL/PLATELET
Abs Immature Granulocytes: 0.1 10*3/uL — ABNORMAL HIGH (ref 0.00–0.07)
Basophils Absolute: 0 10*3/uL (ref 0.0–0.1)
Basophils Relative: 0 %
Eosinophils Absolute: 0 10*3/uL (ref 0.0–0.5)
Eosinophils Relative: 0 %
HCT: 26.5 % — ABNORMAL LOW (ref 39.0–52.0)
Hemoglobin: 8.9 g/dL — ABNORMAL LOW (ref 13.0–17.0)
Immature Granulocytes: 1 %
Lymphocytes Relative: 8 %
Lymphs Abs: 1.2 10*3/uL (ref 0.7–4.0)
MCH: 36 pg — ABNORMAL HIGH (ref 26.0–34.0)
MCHC: 33.6 g/dL (ref 30.0–36.0)
MCV: 107.3 fL — ABNORMAL HIGH (ref 80.0–100.0)
Monocytes Absolute: 0.5 10*3/uL (ref 0.1–1.0)
Monocytes Relative: 3 %
Neutro Abs: 13.1 10*3/uL — ABNORMAL HIGH (ref 1.7–7.7)
Neutrophils Relative %: 88 %
Platelets: 332 10*3/uL (ref 150–400)
RBC: 2.47 MIL/uL — ABNORMAL LOW (ref 4.22–5.81)
RDW: 19.3 % — ABNORMAL HIGH (ref 11.5–15.5)
WBC: 14.9 10*3/uL — ABNORMAL HIGH (ref 4.0–10.5)
nRBC: 0.7 % — ABNORMAL HIGH (ref 0.0–0.2)

## 2020-11-08 LAB — BASIC METABOLIC PANEL
Anion gap: 17 — ABNORMAL HIGH (ref 5–15)
BUN: 46 mg/dL — ABNORMAL HIGH (ref 8–23)
CO2: 19 mmol/L — ABNORMAL LOW (ref 22–32)
Calcium: 8.5 mg/dL — ABNORMAL LOW (ref 8.9–10.3)
Chloride: 109 mmol/L (ref 98–111)
Creatinine, Ser: 2.06 mg/dL — ABNORMAL HIGH (ref 0.61–1.24)
GFR, Estimated: 32 mL/min — ABNORMAL LOW (ref >60)
Glucose, Bld: 177 mg/dL — ABNORMAL HIGH (ref 70–99)
Potassium: 3.6 mmol/L (ref 3.5–5.1)
Sodium: 145 mmol/L (ref 135–145)

## 2020-11-08 LAB — MAGNESIUM: Magnesium: 2.2 mg/dL (ref 1.7–2.4)

## 2020-11-08 LAB — PHOSPHORUS: Phosphorus: 5 mg/dL — ABNORMAL HIGH (ref 2.5–4.6)

## 2020-11-08 MED ORDER — HALOPERIDOL LACTATE 5 MG/ML IJ SOLN
2.5000 mg | Freq: Once | INTRAMUSCULAR | Status: AC
  Administered 2020-11-08: 2.5 mg via INTRAVENOUS
  Filled 2020-11-08: qty 1

## 2020-11-08 MED ORDER — BISACODYL 10 MG RE SUPP: 10.0000 mg | Freq: Every day | RECTAL | Status: DC | PRN

## 2020-11-08 MED ORDER — GLYCOPYRROLATE 0.2 MG/ML IJ SOLN
0.2000 mg | INTRAMUSCULAR | Status: DC | PRN
  Administered 2020-11-10: 0.2 mg via INTRAVENOUS
  Filled 2020-11-08 (×3): qty 1

## 2020-11-08 MED ORDER — LORAZEPAM 2 MG/ML IJ SOLN
1.0000 mg | INTRAMUSCULAR | Status: DC | PRN
  Administered 2020-11-08: 1 mg via INTRAVENOUS
  Filled 2020-11-08: qty 1

## 2020-11-08 MED ORDER — HYDROMORPHONE HCL 1 MG/ML IJ SOLN
0.5000 mg | INTRAMUSCULAR | Status: AC
  Administered 2020-11-08: 0.5 mg via INTRAVENOUS
  Filled 2020-11-08: qty 1

## 2020-11-08 MED ORDER — SODIUM CHLORIDE 0.9 % IV SOLN
0.5000 mg/h | INTRAVENOUS | Status: DC
  Administered 2020-11-08 – 2020-11-12 (×3): 0.5 mg/h via INTRAVENOUS
  Filled 2020-11-08 (×3): qty 2.5

## 2020-11-08 MED ORDER — HYDROMORPHONE BOLUS VIA INFUSION
0.5000 mg | INTRAVENOUS | Status: DC | PRN
  Administered 2020-11-08: 0.5 mg via INTRAVENOUS
  Filled 2020-11-08: qty 1

## 2020-11-08 MED ORDER — HALOPERIDOL LACTATE 5 MG/ML IJ SOLN: 2.0000 mg | Freq: Four times a day (QID) | INTRAMUSCULAR | Status: DC | PRN

## 2020-11-08 NOTE — Progress Notes (Addendum)
Daily Progress Note   Patient Name: David Perez       Date: 11/08/2020 DOB: 04-01-1941  Age: 79 y.o. MRN#: [id number] Attending Physician: Sharen Hones, MD Primary Care Physician: Marval Regal, NP Admit Date: 11/09/2020  Reason for Consultation/Follow-up: Establishing goals of care and Terminal Care  Subjective: Patient awake, alert with some confusion. Fixates on food and drink.   GOC: Met with daughters, [name] and Langley Gauss, in family waiting room. Introduced role of palliative medicine.  Discussed in detail course of hospitalization including diagnoses, interventions, plan of care, and unfortunate poor prognosis secondary to multiorgan failure. Daughters appreciate conversations with Dr. Roosevelt Locks and Dr. Mortimer Fries yesterday and understand their father's poor condition and prognosis. The patient was agitated on night shift and continues to ask for food and drink. Daughters agree that comfort and allowing him to eat, knowing there is limited time, is their priority. They do not wish to see him suffer or struggle for air.   Discussed shift to comfort focused care plan and discontinuation of BiPAP in order to start comfort feeds. Discussed aspiration risk and precautions. Emphasized focus on symptom management to ensure comfort and relief from suffering. Daughters understand and agree with shift to comfort. Discussed initiation of continuous infusion for pain/dyspnea to ensure he does not struggle. Daughters feel this would be best. Prepared daughters for anything to happen at any time when BiPAP is discontinued.   Went back to bedside with daughters and patient. Removed BiPAP and placed on 6L [place]. Removed cardiac monitor. Patient awake and alert and asking for food and drink. Repositioned and  daughters assisting with sips. Patient reports feeling slightly short of breath. Educated on medication to help with his breathing and in order to keep him off of BiPAP. Patient agreeable to take medicine.   Answered questions. Emotional support provided. PMT contact information given.   Updated RN and Dr. Roosevelt Locks on plan of care.    Length of Stay: 3  Current Medications: Scheduled Meds:  . feeding supplement  1 Container Oral Q24H  . feeding supplement  237 mL Oral BID BM  .  HYDROmorphone (DILAUDID) injection  0.5 mg Intravenous NOW    Continuous Infusions: . HYDROmorphone      PRN Meds: acetaminophen **OR** acetaminophen, glycopyrrolate, haloperidol lactate, HYDROmorphone, LORazepam, menthol-cetylpyridinium, ondansetron **OR** ondansetron (ZOFRAN) IV  Physical Exam Vitals and nursing note reviewed.  Constitutional:      Appearance: He is cachectic. He is ill-appearing.  HENT:     Head: Normocephalic and atraumatic.  Pulmonary:     Effort: No tachypnea, accessory muscle usage or respiratory distress.     Breath sounds: Decreased breath sounds present.     Comments: BiPAP removed. 6L [place] Abdominal:     Tenderness: There is no abdominal tenderness.  Skin:    General: Skin is warm and dry.     Coloration: Skin is pale.  Neurological:     Mental Status: He is alert and easily aroused.     Comments: Awake, some confusion            Vital Signs: BP 123/63 (BP Location: Right Arm)   Pulse (!) 108   Temp 97.8 F (36.6 C) (Axillary)   Resp 17   Ht _0  (1.702 m)   Wt 59.4 kg Comment: low bed  SpO2 100%   BMI 20.52 kg/m  SpO2: SpO2: 100 % O2 Device: O2 Device: Bi-PAP O2 Flow Rate: O2 Flow Rate (L/min): 15 L/min  Intake/output summary:   Intake/Output Summary (Last 24 hours) at 11/08/2020 0920 Last data filed at 11/08/2020 0400 Gross per 24 hour  Intake 211.74 ml  Output 1350 ml  Net -1138.26 ml   LBM: Last BM Date: 11/06/20 Baseline Weight: Weight: 45.8  kg Most recent weight: Weight: 59.4 kg (low bed)       Palliative Assessment/Data: PPS 30%    Flowsheet Rows     Most Recent Value  Intake Tab  Referral Department Hospitalist  Unit at Time of Referral Cardiac/Telemetry Unit  Palliative Care Primary Diagnosis Cardiac  Palliative Care Type New Palliative care  Reason for referral Clarify Goals of Care  Date first seen by Palliative Care 11/07/20  Clinical Assessment  Palliative Performance Scale Score 30%  Psychosocial & Spiritual Assessment  Palliative Care Outcomes  Patient/Family meeting held? Yes  Who was at the meeting? daughter at bedside, daughter on speaker phone  Palliative Care Outcomes Clarified goals of care, Improved pain interventions, Improved non-pain symptom therapy, Provided end of life care assistance, Provided psychosocial or spiritual support, ACP counseling assistance      Patient Active Problem List   Diagnosis Date Noted  . Aspiration pneumonia of both lower lobes due to gastric secretions ([place]) 11/07/2020  . Palliative care by specialist   . Acute embolic stroke ([place]) 02/63/7858  . Moderate pulmonary hypertension ([place]) 11/06/2020  . Anemia of chronic disease 11/06/2020  . Severe protein-calorie malnutrition Altamease Oiler: less than 60% of standard weight) ([place]) 11/06/2020  . AF (paroxysmal atrial fibrillation) ([place]) 11/06/2020  . Acute hypoxemic respiratory failure ([place]) 11/06/2020  . Hypotension 11/05/2020  . Failure to thrive in adult 11/05/2020  . Hypotension due to hypovolemia 12/02/2020  . Protein malnutrition ([place]) 11/26/2020  . Elevated liver enzymes 08/15/2020  . Weight loss 08/15/2020  . 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 Chronic anticoagulation 05/11/2020  . Goals of care, counseling/discussion 05/10/2020  . Pleural effusion 01/30/2020  . CHF (congestive heart failure) ([place])   . Leukocytosis   . Macrocytic anemia   . Acute systolic congestive heart failure ([place])   . AKI (acute  kidney injury) ([place])   . SOB (shortness of breath)   . Acute on chronic systolic CHF (congestive heart failure) ([place])   . Debility 01/08/2020  . STEMI involving left main coronary artery ([place]) 12/31/2019  . STEMI (ST  elevation myocardial infarction) ([place]) 12/31/2019  . 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 Family reports fall and weight loss. 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 On 12/6, patient developed severe hypoxemia, lactic acidosis. PCCM consulted. Chest CT reveals extensive bilateral emphysematous changes along with bilateral interstitial lung disease versus edema versus aspiration pneumonia. Dr. Mortimer Fries spoke with daughters. Patient placed on BiPAP. DNR/DNI and no plans for transfer to ICU. IV morphine as needed. Palliative medicine consultation for goals of care/terminal care.    Assessment: Acute hypoxemic respiratory failure Likely aspiration pneumonia Acute on chronic systolic CHF with EF 16-10% Severe sepsis with lactic acidosis Acute embolic stroke Paroxysmal atrial fibrillation Acute metabolic encephalopathy Emphysema/COPD Severe protein calorie malnutrition AKI Anemia of chronic disease  Recommendations/Plan:  F/u GOC with daughters Langley Gauss and [name]) if family conference room. Daughters understand his condition and poor prognosis.  Shift to comfort measures only. Discontinue interventions not aimed at comfort including BiPAP, cardiac monitor.   Symptom management  Dilaudid 0.49m continuous infusion  RN may bolus dilaudid via infusion [id number] pain/dyspnea/air hunger/tachypnea  Ativan 1mg8m q4h prn anxiety  Haldol 2mg 55m q6h prn agitation  Robinul 0.2mg I67m4h prn secretions  Comfort feeds per patient/family request. Daughters understand aspiration risk. Aspiration precautions.  Spiritual care consult if daughters request.  Unrestricted visitor access as patient is nearing EOL.   Code Status: DNR/DNI   Code Status Orders  (From admission, onward)         Start     Ordered   11/05/20 1447  Do not attempt resuscitation (DNR)  Continuous       Question Answer Comment  In the event of cardiac or respiratory ARREST Do not call a "code blue"   In the event of cardiac or respiratory ARREST Do not perform Intubation, CPR, defibrillation or ACLS   In the event of cardiac or respiratory ARREST Use medication by any route, position, wound care, and other measures to relive pain and suffering. May use oxygen, suction and manual treatment of airway obstruction as needed for comfort.      11/05/20 1446        Code Status History    Date Active Date Inactive Code Status Order ID Comments User Context   11/13/2020 2308 11/05/2020 1446 Full Code [id number] Amy Briant CedarD   01/08/2020 1817 01/29/2020 2017 Full Code [id number], Flora Lippsient   12/31/2019 0411 01/08/2020 1809 Full Code [id number] Dressernpatient   12/31/2019 0408 12/31/2019 0411 Full Code [id number] Kidanpatient   12/31/2019 0351 12/31/2019 0408 Full Code [id number] Dressernpatient   Advance Care Planning Activity    Advance Directive Documentation     Most Recent Value  Type of Advance Directive Living will  Pre-existing out of facility DNR order (yellow form or pink MOST form) --  "MOST" Form in Place? --       Prognosis:  Prognosis poor: days if not hours if acute decline from aspiration. Multiorgan failure. Goals are aimed at comfort.   Discharge Planning:  Hospital death vs transfer to hospice facility if stable  Care plan was discussed with daughters Langley Gauss and  [name]), RN, Dr. Roosevelt Locks  Thank you for allowing the Palliative Medicine Team to assist in the care of this patient.   Total Time 50 Prolonged Time Billed no      Greater than 50%  of this time was spent counseling and coordinating care related to the above assessment and plan.  Ihor Dow, DNP, FNP-C Palliative Medicine Team  Phone: [phone] Fax: [phone]  Please contact Palliative Medicine Team phone at [phone] for questions and concerns.

## 2020-11-08 NOTE — TOC Progression Note (Signed)
Transition of Care [facility]) - Progression Note    Patient Details  Name: David Perez MRN: [id number] Date of Birth: [birth year]  Transition [facility] CM/SW [place], RN Phone Number: 11/08/2020, 1:42 PM  Clinical Narrative:     Referral made to [facility], there are no beds available today, but they have referral and are currently working on this.        Expected Discharge Plan and Services                                                 Social Determinants of Health (SDOH) Interventions    Readmission Risk Interventions No flowsheet data found.

## 2020-11-08 NOTE — Progress Notes (Signed)
[facility] hospital Liaison note:  New referral for family interest in [facility] hospice home. Patient information sent to referral. Hospice home eligibility has been confirmed.  Writer spoke via telephone to patient's daughter [name] to initiate education regarding hospice services, philosophy, team approach to care and current visitation policy with understanding voiced.  Unfortunately [facility] is unable to offer a bed today. Family and hospital care team made aware. Liaison will follow and update daily regarding bed availability. Thank you for the opportunity to be involved in the care of this patient and his family.  Flo Shanks BSN, RN, [place] [phone]

## 2020-11-08 NOTE — Progress Notes (Signed)
Pt has continually pulled off his monitor leads and O2 and gown off throughout the night.  Pt refuses ativan and is combative at times.  Attempted to put mitts on but pt pleaded not to.  Will cont to observe.  ST on the monitor.

## 2020-11-08 NOTE — Progress Notes (Signed)
Patient seen appx hour and half ago and asked to put back on bipap per RN as suspected was pulling oxygen off causing desaturation. Patient however had oxygen on and was distressed due to noisy distractions about room. O2 readings not picking up well as patients presentation was somewhat better breathing wise from when I saw him 3am; less labored. Patient is confused at times however can carry on a conversation fairly well. O2 saturation when reading well noted at 92%. I expressed my concerns with RN that since patient is at end of hall placing him on bipap without someone to respond immediately if he disconnects and can't take mask off is my concern, if he is actually taking his oxygen off. He is also coughing up what appears to be clots raises another issue. So we agreed to continue with HF 15 liter cannula at the time and continue to monitor

## 2020-11-08 NOTE — Progress Notes (Signed)
PROGRESS NOTE    David Perez  FIE:[id number] DOB: 1941-06-15 DOA: 11/22/2020 PCP: Marval Regal, NP   Chief complaint.  Shortness of breath. 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 He was sent from hematology clinic with hypotension. Discussed with patient daughter, patient had a fall about 5 days ago. He has generalized weakness. He occasionally confused. He has a poor p.o. intake, he has lost 20 pounds of body weight. He also has some loose stools. He currently lives alone at home. When I see the patient today, he appeared to have some confusion. Speech is slurred. But no weakness in the legs or arms. No facial droop. He had a CT scan of abdomen/pelvis in the emergency room, did not see any acute changes.Hb7.7. 12/5.MRI of the brain showed acute stroke in the right parietal lobe and the left cerebellar. 12/6.  Patient developed severe hypoxemia, lactic acidosis, patient is seen by pulmonology, started palliative care.   Assessment & Plan:   Principal Problem:   Hypotension due to hypovolemia Active Problems:   S/P CABG x 3   Debility   Acute on chronic systolic CHF (congestive heart failure) (HCC)   Macrocytic anemia   AKI (acute kidney injury) ([place])   Goals of care, counseling/discussion   Chronic anticoagulation   Weight loss   Protein malnutrition (HCC)   Hypotension   Failure to thrive in adult   Acute embolic stroke (HCC)   Moderate pulmonary hypertension ([place])   Anemia of chronic disease   Severe protein-calorie malnutrition [employer]: less than 60% of standard weight) (HCC)   AF (paroxysmal atrial fibrillation) (HCC)   Acute hypoxemic respiratory failure (HCC)   Aspiration pneumonia of both lower lobes due to gastric secretions ([place])   Palliative care by specialist  Patient condition is not improving, discontinue the BiPAP, changed  nasal cannula.  Patient is a seen by palliative care/hospice, currently receiving comfort care. We will continue current treatment. May be able to transfer to inpatient hospice by tomorrow.      Subjective: Patient is comfortable today, currently receiving Dilaudid drip.  Palliative care try to wean that off before transfer to inpatient hospice. Currently patient does not have been shortness of breath, but has some confusion. He ate some lunch today, no nausea vomiting.  Objective: Vitals:   11/07/20 2234 11/08/20 0412 11/08/20 0518 11/08/20 0736  BP:  110/77 104/71 123/63  Pulse:  (!) 143 (!) 105 (!) 108  Resp:  (!) 28 (!) 24 17  Temp:  98.1 F (36.7 C) 98 F (36.7 C) 97.8 F (36.6 C)  TempSrc:  Axillary Axillary Axillary  SpO2: 99% 95% 93% 100%  Weight:   59.4 kg   Height:        Intake/Output Summary (Last 24 hours) at 11/08/2020 1349 Last data filed at 11/08/2020 0400 Gross per 24 hour  Intake 211.74 ml  Output 1350 ml  Net -1138.26 ml   Filed Weights   11/08/2020 1350 11/07/20 0552 11/08/20 0518  Weight: 45.8 kg 66.2 kg 59.4 kg    Examination:  General exam: Ill-appearing, cachectic, severe muscle atrophy. Respiratory system: Decreased breathing sounds. Respiratory effort normal. Cardiovascular system: Regular and tachycardic no JVD, murmurs, rubs, gallops or clicks. No pedal edema. Gastrointestinal system: Abdomen is nondistended, soft and nontender. No organomegaly or masses felt. Normal bowel sounds heard. Central nervous system: Alert and oriented x1.  No focal neurological deficits. Extremities: Symmetric  Skin: No rashes, lesions or ulcers     Data Reviewed: I have personally reviewed following labs and imaging studies  CBC: Recent Labs  Lab 11/02/20 1131 11/02/20 1131 11/28/2020 1358 11/05/20 0553 11/06/20 0515 11/07/20 0303 11/08/20 0426  WBC 8.4   < > 6.5 6.8 9.0 16.1* 14.9*  NEUTROABS 7.0  --   --   --  7.3 12.9* 13.1*  HGB 8.1*   < > 8.6*  7.7* 8.2* 9.1* 8.9*  HCT 24.8*   < > 26.2* 23.1* 24.3* 28.6* 26.5*  MCV 109.7*   < > 109.2* 109.5* 105.7* 112.2* 107.3*  PLT 294   < > 311 275 278 335 332   < > = values in this interval not displayed.   Basic Metabolic Panel: Recent Labs  Lab 11/02/20 1131 11/18/2020 1358 11/05/20 0553 11/07/20 0303 11/08/20 0426  NA 140 138 138 137 145  K 4.3 4.6 3.7 4.4 3.6  CL 105 104 110 109 109  CO2 26 25 22  13* 19*  GLUCOSE 171* 124* 94 303* 177*  BUN 32* 28* 22 27* 46*  CREATININE 1.09 1.35* 1.00 1.42* 2.06*  CALCIUM 9.2 9.4 7.7* 8.2* 8.5*  MG  --   --   --  2.2 2.2  PHOS  --   --   --   --  5.0*   GFR: Estimated Creatinine Clearance: 23.6 mL/min (A) (by C-G formula based on SCr of 2.06 mg/dL (H)). Liver Function Tests: Recent Labs  Lab 11/02/20 1131 11/07/20 0303  AST 67* 42*  ALT 103* 49*  ALKPHOS 75 67  BILITOT 0.9 1.3*  PROT 6.9 6.8  ALBUMIN 2.4* 3.0*   No results for input(s): LIPASE, AMYLASE in the last 168 hours. No results for input(s): AMMONIA in the last 168 hours. Coagulation Profile: No results for input(s): INR, PROTIME in the last 168 hours. Cardiac Enzymes: No results for input(s): CKTOTAL, CKMB, CKMBINDEX, TROPONINI in the last 168 hours. BNP (last 3 results) No results for input(s): PROBNP in the last 8760 hours. HbA1C: No results for input(s): HGBA1C in the last 72 hours. CBG: Recent Labs  Lab 11/05/20 2347  GLUCAP 150*   Lipid Profile: No results for input(s): CHOL, HDL, LDLCALC, TRIG, CHOLHDL, LDLDIRECT in the last 72 hours. Thyroid Function Tests: Recent Labs    11/06/20 0515  TSH 3.289   Anemia Panel: Recent Labs    11/05/20 2008  VITAMINB12 906  TIBC 151*  IRON 27*   Sepsis Labs: Recent Labs  Lab 11/06/20 2342 11/07/20 0629 11/07/20 0746  PROCALCITON 2.79  --   --   LATICACIDVEN  --  7.6* 6.8*    Recent Results (from the past 240 hour(s))  Resp Panel by RT-PCR (Flu A&B, Covid) Nasopharyngeal Swab     Status: None    Collection Time: 11/08/2020 10:03 PM   Specimen: Nasopharyngeal Swab; Nasopharyngeal(NP) swabs in vial transport medium  Result Value Ref Range Status   SARS Coronavirus 2 by RT PCR NEGATIVE NEGATIVE Final    Comment: (NOTE) SARS-CoV-2 target nucleic acids are NOT DETECTED.  The SARS-CoV-2 RNA is generally detectable in upper respiratory specimens during the acute phase of infection. The lowest concentration of SARS-CoV-2 viral copies this assay can detect is 138 copies/mL. A negative result does not preclude SARS-Cov-2 infection and should not be used as the sole basis for treatment or other patient management decisions. A negative result may occur with  improper specimen collection/handling, submission of specimen other than nasopharyngeal swab, presence  of viral mutation(s) within the areas targeted by this assay, and inadequate number of viral copies(<138 copies/mL). A negative result must be combined with clinical observations, patient history, and epidemiological information. The expected result is Negative.  Fact Sheet for Patients:  EntrepreneurPulse.com.au  Fact Sheet for Healthcare Providers:  IncredibleEmployment.be  This test is no t yet approved or cleared by the [place] FDA and  has been authorized for detection and/or diagnosis of SARS-CoV-2 by FDA under an Emergency Use Authorization (EUA). This EUA will remain  in effect (meaning this test can be used) for the duration of the COVID-19 declaration under Section 564(b)(1) of the Act, 21 U.S.C.section 360bbb-3(b)(1), unless the authorization is terminated  or revoked sooner.       Influenza A by PCR NEGATIVE NEGATIVE Final   Influenza B by PCR NEGATIVE NEGATIVE Final    Comment: (NOTE) The Xpert Xpress SARS-CoV-2/FLU/RSV plus assay is intended as an aid in the diagnosis of influenza from Nasopharyngeal swab specimens and should not be used as a sole basis for treatment.  Nasal washings and aspirates are unacceptable for Xpert Xpress SARS-CoV-2/FLU/RSV testing.  Fact Sheet for Patients: EntrepreneurPulse.com.au  Fact Sheet for Healthcare Providers: IncredibleEmployment.be  This test is not yet approved or cleared by the [place] FDA and has been authorized for detection and/or diagnosis of SARS-CoV-2 by FDA under an Emergency Use Authorization (EUA). This EUA will remain in effect (meaning this test can be used) for the duration of the COVID-19 declaration under Section 564(b)(1) of the Act, 21 U.S.C. section 360bbb-3(b)(1), unless the authorization is terminated or revoked.  Performed at [facility], [address] [place], [place] [address]   CULTURE, BLOOD (ROUTINE X 2) w Reflex to ID Panel     Status: None (Preliminary result)   Collection Time: 11/07/20  6:29 AM   Specimen: BLOOD  Result Value Ref Range Status   Specimen Description BLOOD BLOOD RIGHT HAND  Final   Special Requests   Final    BOTTLES DRAWN AEROBIC AND ANAEROBIC Blood Culture results may not be optimal due to an inadequate volume of blood received in culture bottles   Culture   Final    NO GROWTH 1 DAY Performed at [facility], [address] [place], [place] [address]    Report Status PENDING  Incomplete  CULTURE, BLOOD (ROUTINE X 2) w Reflex to ID Panel     Status: None (Preliminary result)   Collection Time: 11/07/20  6:29 AM   Specimen: BLOOD  Result Value Ref Range Status   Specimen Description BLOOD FINGER  Final   Special Requests   Final    BOTTLES DRAWN AEROBIC AND ANAEROBIC Blood Culture adequate volume   Culture   Final    NO GROWTH 1 DAY Performed at [facility], [address] [place], [place] [address]    Report Status PENDING  Incomplete         Radiology Studies: CT ANGIO CHEST PE W OR WO CONTRAST  Result Date: 11/06/2020 CLINICAL DATA:  PE suspected, positive D-dimer  EXAM: CT ANGIOGRAPHY CHEST WITH CONTRAST TECHNIQUE: Multidetector CT imaging of the chest was performed using the standard protocol during bolus administration of intravenous contrast. Multiplanar CT image reconstructions and MIPs were obtained to evaluate the vascular anatomy. CONTRAST:  1mL OMNIPAQUE IOHEXOL 350 MG/ML SOLN COMPARISON:  Radiograph 11/23/2020, CT 12/30/2018 FINDINGS: Cardiovascular: Satisfactory opacification the pulmonary arteries to the segmental level. No pulmonary artery filling defects are identified. Central pulmonary  arteries are normal caliber. Borderline cardiomegaly. Calcification of the native coronaries. 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 No pericardial effusion. Atherosclerotic plaque within the normal caliber aorta. No visible acute luminal abnormality of the imaged aorta. No periaortic stranding or hemorrhage. Reflux of contrast into the hepatic veins and IVC. Mediastinum/Nodes: Scattered mediastinal and hilar nodes are present, several increasing slightly in prominence from comparison studies, largest include a left paratracheal node measuring 11 mm (5/35) and an 11 mm subcarinal node (5/46). Worrisome axillary adenopathy. No acute abnormality of the thoracic esophagus. Coronal narrowing of the trachea may reflect some chronic obstructive pulmonary lung disease. No other acute abnormality is seen. Lungs/Pleura: Diffuse centrilobular emphysema. Interlobular septal thickening is present as well as some developing hazy ground-glass and more consolidative opacity in mid to lower lung predominance. Vascular redistribution is seen. A more focal region of opacity with some adjacent pleural tenting/architectural distortion is seen in the lateral aspect of the left upper lobe/superior lingula (5/47) small bilateral effusions with adjacent passive atelectasis. A similar area is also present in the lateral  segment left lower lobe as well (7/74). No pneumothorax. Upper Abdomen: Atherosclerotic calcifications in the upper abdomen. Reflux of contrast into the hepatic veins and IVC, as above. No acute abnormalities present in the visualized portions of the upper abdomen. Musculoskeletal: The osseous structures appear diffusely demineralized which may limit detection of small or nondisplaced fractures. Exaggerated thoracic kyphosis is similar to prior with some chronic likely degenerative vertebral body height loss. Increased diameter of chest is also unchanged. Prior sternotomy without acute complication. Paucity of subcutaneous fat. Mild diffuse body wall edema. No focal chest wall lesions. Review of the MIP images confirms the above findings. IMPRESSION: 1. No evidence of acute pulmonary artery embolism. 2. Extensive severe centrilobular emphysema. 3. Interspersed areas of ground-glass opacity, septal thickening and vascular redistribution suggest developing CHF/volume overload with pulmonary edema. 4. More focal opacities in the periphery of the left upper lobe/lingula and left lower lobe, could reflect regions of scarring and architectural distortion with atelectatic collapse and edema though underlying airspace disease/infection not excluded. Furthermore, should pursue follow-up imaging post therapeutic intervention to ensure resolution. 5. Scattered mediastinal and hilar nodes, several increasing slightly in prominence from comparison studies, possibly reactive. 6. 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 7. Aortic Atherosclerosis (ICD10-I70.0). Electronically Signed   By: Lovena Le M.D.   On: 11/06/2020 22:38   ECHOCARDIOGRAM COMPLETE  Result Date: 11/07/2020    ECHOCARDIOGRAM REPORT   Patient Name:   JERONIMO HELLBERG Date of Exam: 11/07/2020 Medical Rec #:  [id number]          Height:       67.0 in Accession #:    [id number]         Weight:       146.0 lb Date of  Birth:  [birth year]          BSA:          1.769 m Patient Age:    47 years           BP:           95/61 mmHg Patient Gender: M                  HR:           97 bpm. Exam Location:  [facility] Procedure: 2D Echo, Color Doppler, Cardiac Doppler and Strain Analysis  Indications:     I50.9 Congestive Heart Failure  History:         Patient has prior history of Echocardiogram examinations, most                  recent 06/10/2020. CAD; Prior CABG. Hepatitis B.  Sonographer:     Charmayne Sheer RDCS (AE) Referring Phys:  [id number] BRENDA MORRISON Diagnosing Phys: Kathlyn Sacramento MD  Sonographer Comments: Suboptimal parasternal window. Global longitudinal strain was attempted. IMPRESSIONS  1. Left ventricular ejection fraction, by estimation, is 25 to 30%. The left ventricle has severely decreased function. The left ventricle demonstrates regional wall motion abnormalities (see scoring diagram/findings for description). The left ventricular internal cavity size was moderately dilated. Left ventricular diastolic parameters are consistent with Grade II diastolic dysfunction (pseudonormalization). There is akinesis of the left ventricular, entire anteroseptal wall, anterior wall and apical segment. The average left ventricular global longitudinal strain is -5.5 %. The global longitudinal strain is abnormal.  2. Right ventricular systolic function is normal. The right ventricular size is normal. There is severely elevated pulmonary artery systolic pressure. The estimated right ventricular systolic pressure is 69.6 mmHg.  3. Left atrial size was mildly dilated.  4. The mitral valve is abnormal. Severe mitral valve regurgitation. No evidence of mitral stenosis. Moderate mitral annular calcification.  5. Tricuspid valve regurgitation is severe.  6. The aortic valve has been repaired/replaced. Aortic valve regurgitation is not visualized. No aortic stenosis is present. Aortic valve mean gradient measures 8.0 mmHg.  7. The inferior vena cava is  normal in size with <50% respiratory variability, suggesting right atrial pressure of 8 mmHg. FINDINGS  Left Ventricle: Left ventricular ejection fraction, by estimation, is 25 to 30%. The left ventricle has severely decreased function. The left ventricle demonstrates regional wall motion abnormalities. The average left ventricular global longitudinal strain is -5.5 %. The global longitudinal strain is abnormal. The left ventricular internal cavity size was moderately dilated. There is no left ventricular hypertrophy. Left ventricular diastolic parameters are consistent with Grade II diastolic dysfunction (pseudonormalization). Right Ventricle: The right ventricular size is normal. No increase in right ventricular wall thickness. Right ventricular systolic function is normal. There is severely elevated pulmonary artery systolic pressure. The tricuspid regurgitant velocity is 3.62 m/s, and with an assumed right atrial pressure of 8 mmHg, the estimated right ventricular systolic pressure is 78.9 mmHg. Left Atrium: Left atrial size was mildly dilated. Right Atrium: Right atrial size was normal in size. Pericardium: There is no evidence of pericardial effusion. Mitral Valve: The mitral valve is abnormal. Moderate mitral annular calcification. Severe mitral valve regurgitation. No evidence of mitral valve stenosis. MV peak gradient, 9.7 mmHg. The mean mitral valve gradient is 5.0 mmHg. Tricuspid Valve: The tricuspid valve is normal in structure. Tricuspid valve regurgitation is severe. No evidence of tricuspid stenosis. Aortic Valve: The aortic valve has been repaired/replaced. Aortic valve regurgitation is not visualized. No aortic stenosis is present. Aortic valve mean gradient measures 8.0 mmHg. Aortic valve peak gradient measures 14.1 mmHg. Aortic valve area, by VTI  measures 2.50 cm. 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 Pulmonic Valve: The pulmonic valve was normal in structure. Pulmonic  valve regurgitation is not visualized. No evidence of pulmonic stenosis. Aorta: The aortic root is normal in size and structure. Venous: The inferior vena cava is normal in size with less than 50% respiratory variability, suggesting right atrial pressure of 8 mmHg. IAS/Shunts: No atrial level shunt detected by color  flow Doppler.  LEFT VENTRICLE PLAX 2D LVIDd:         5.16 cm  Diastology LVIDs:         4.01 cm  LV e' medial:    3.70 cm/s LV PW:         0.92 cm  LV E/e' medial:  34.6 LV IVS:        0.76 cm  LV e' lateral:   4.13 cm/s LVOT diam:     2.20 cm  LV E/e' lateral: 31.0 LV SV:         78 LV SV Index:   44       2D Longitudinal Strain LVOT Area:     3.80 cm 2D Strain GLS Avg:     -5.5 %                          3D Volume EF:                         3D EF:        40 %                         LV EDV:       147 ml                         LV ESV:       88 ml                         LV SV:        59 ml RIGHT VENTRICLE RV Basal diam:  3.44 cm LEFT ATRIUM           Index       RIGHT ATRIUM           Index LA diam:      2.90 cm 1.64 cm/m  RA Area:     16.00 cm LA Vol (A2C): 31.8 ml 17.98 ml/m RA Volume:   41.50 ml  23.46 ml/m LA Vol (A4C): 67.7 ml 38.27 ml/m  AORTIC VALVE                    PULMONIC VALVE AV Area (Vmax):    2.41 cm     PV Vmax:       0.88 m/s AV Area (Vmean):   2.32 cm     PV Vmean:      62.600 cm/s AV Area (VTI):     2.50 cm     PV VTI:        0.118 m AV Vmax:           188.00 cm/s  PV Peak grad:  3.1 mmHg AV Vmean:          130.000 cm/s PV Mean grad:  2.0 mmHg AV VTI:            0.312 m AV Peak Grad:      14.1 mmHg AV Mean Grad:      8.0 mmHg LVOT Vmax:         119.00 cm/s LVOT Vmean:        79.500 cm/s LVOT VTI:          0.205 m LVOT/AV VTI ratio: 0.66  AORTA Ao Root diam: 2.80 cm MITRAL VALVE  TRICUSPID VALVE MV Area (PHT): 8.16 cm     TR Peak grad:   52.4 mmHg MV Peak grad:  9.7 mmHg     TR Vmax:        362.00 cm/s MV Mean grad:  5.0 mmHg MV Vmax:       1.56 m/s      SHUNTS MV Vmean:      110.0 cm/s   Systemic VTI:  0.20 m MV Decel Time: 93 msec      Systemic Diam: 2.20 cm MV E velocity: 128.00 cm/s MV A velocity: 117.00 cm/s MV E/A ratio:  1.09 Kathlyn Sacramento MD Electronically signed by Kathlyn Sacramento MD Signature Date/Time: 11/07/2020/2:30:00 PM    Final         Scheduled Meds: . feeding supplement  1 Container Oral Q24H  . feeding supplement  237 mL Oral BID BM   Continuous Infusions: . HYDROmorphone 0.5 mg/hr (11/08/20 0944)     LOS: 3 days    Time spent: 27 minutes    Sharen Hones, MD [facility]   To contact the attending provider between 7A-7P or the covering provider during after hours 7P-7A, please log into the web site [email or web address] and access using universal [place] password for that web site. If you do not have the password, please call the hospital operator.  11/08/2020, 1:49 PM

## 2020-11-09 ENCOUNTER — Encounter: Payer: Self-pay | Admitting: *Deleted

## 2020-11-09 DIAGNOSIS — I5022 Chronic systolic (congestive) heart failure: Secondary | ICD-10-CM

## 2020-11-09 NOTE — Care Management Important Message (Signed)
Important Message  Patient Details  Name: David Perez MRN: [id number] Date of Birth: 04-22-1941   Medicare Important Message Given:  Yes     Dannette Barbara 11/09/2020, 11:31 AM

## 2020-11-09 NOTE — Progress Notes (Signed)
Manufacturing engineer hospital Liaison note:  Follow up visit to new referral for [facility] hosp[facility] home. Patient seen lying in bed, appears comfortable, remains on 0.29m/hr continuous Dilaudid infusion and 8 liters of oxygen..   Breathing regular, 10 second periods of apnea noted. [employer] operational officer at bedside. End of life and symptom management education given to family. Emotional support given as well.  Family and hospital care team aware that at this time [facility] does not have bed availability.  Liaison will continue to follow and update all regarding bed availability. Flo Shanks BSN, RN, [place] [phone]

## 2020-11-09 NOTE — Progress Notes (Signed)
PROGRESS NOTE    David Perez  OZD:[id number] DOB: 13-Jun-1941 DOA: 11/10/2020 PCP: Marval Regal, NP   Chief complaint.  Shortness of breath. 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 He was sent from hematology clinic with hypotension. Discussed with patient daughter, patient had a fall about 5 days ago. He has generalized weakness. He occasionally confused. He has a poor p.o. intake, he has lost 20 pounds of body weight. He also has some loose stools. He currently lives alone at home. When I see the patient today, he appeared to have some confusion. Speech is slurred. But no weakness in the legs or arms. No facial droop. He had a CT scan of abdomen/pelvis in the emergency room, did not see any acute changes.Hb7.7. 12/5.MRI of the brain showed acute stroke in the right parietal lobe and the left cerebellar. 12/6.  Patient developed severe hypoxemia, lactic acidosis, patient is seen by pulmonology, started palliative care.   Assessment & Plan:   Principal Problem:   Hypotension due to hypovolemia Active Problems:   S/P CABG x 3   Debility   Acute on chronic systolic CHF (congestive heart failure) (HCC)   Macrocytic anemia   AKI (acute kidney injury) ([place])   Goals of care, counseling/discussion   Chronic anticoagulation   Weight loss   Protein malnutrition (HCC)   Hypotension   Failure to thrive in adult   Acute embolic stroke (HCC)   Moderate pulmonary hypertension ([place])   Anemia of chronic disease   Severe protein-calorie malnutrition ([place]: less than 60% of standard weight) (HCC)   AF (paroxysmal atrial fibrillation) ([place])   Acute hypoxemic respiratory failure (HCC)   Aspiration pneumonia of both lower lobes due to gastric secretions ([place])   Palliative care by specialist  End of life care Transitioned to comfort care, now asleep with  asynchronous breathing, appears comfortable on dilaudid gtt. Plan for inpatient hospice vs death in hospital, no hospice bed available today. Updated both daughters at bedside today, answered all questions.        Subjective: Asleep, appears comfortable  Objective: Vitals:   11/08/20 0736 11/08/20 2027 11/08/20 2028 11/09/20 0814  BP: 123/63 101/76 103/73 91/61  Pulse: (!) 108 (!) 141 (!) 139 (!) 136  Resp: 17   16  Temp: 97.8 F (36.6 C)   97.8 F (36.6 C)  TempSrc: Axillary   Oral  SpO2: 100% (!) 83%  (!) 88%  Weight:      Height:        Intake/Output Summary (Last 24 hours) at 11/09/2020 1207 Last data filed at 11/09/2020 0537 Gross per 24 hour  Intake 129.28 ml  Output 365 ml  Net -235.72 ml   Filed Weights   11/07/20 0552 11/08/20 0518  Weight: 66.2 kg 59.4 kg    Examination:  General exam: Ill-appearing, cachectic, severe muscle atrophy. Respiratory system: Decreased breathing sounds.  Cardiovascular system: Regular and tachycardic no JVD, murmurs, rubs, gallops or clicks. No pedal edema. Gastrointestinal system: Abdomen is nondistended, soft and nontender.  Central nervous system: asleep Extremities: Symmetric  Skin: No rashes, lesions or ulcers     Data Reviewed: I have personally reviewed following labs and imaging studies  CBC: Recent Labs  Lab 11/09/2020 1358 11/05/20 0553 11/06/20 0515 11/07/20 0303 11/08/20 0426  WBC 6.5 6.8 9.0 16.1* 14.9*  NEUTROABS  --   --  7.3 12.9* 13.1*  HGB 8.6* 7.7*  8.2* 9.1* 8.9*  HCT 26.2* 23.1* 24.3* 28.6* 26.5*  MCV 109.2* 109.5* 105.7* 112.2* 107.3*  PLT 311 275 278 335 378   Basic Metabolic Panel: Recent Labs  Lab 11/05/2020 1358 11/05/20 0553 11/07/20 0303 11/08/20 0426  NA 138 138 137 145  K 4.6 3.7 4.4 3.6  CL 104 110 109 109  CO2 25 22 13* 19*  GLUCOSE 124* 94 303* 177*  BUN 28* 22 27* 46*  CREATININE 1.35* 1.00 1.42* 2.06*  CALCIUM 9.4 7.7* 8.2* 8.5*  MG  --   --  2.2 2.2  PHOS  --   --   --   5.0*   GFR: Estimated Creatinine Clearance: 23.6 mL/min (A) (by C-G formula based on SCr of 2.06 mg/dL (H)). Liver Function Tests: Recent Labs  Lab 11/07/20 0303  AST 42*  ALT 49*  ALKPHOS 67  BILITOT 1.3*  PROT 6.8  ALBUMIN 3.0*   No results for input(s): LIPASE, AMYLASE in the last 168 hours. No results for input(s): AMMONIA in the last 168 hours. Coagulation Profile: No results for input(s): INR, PROTIME in the last 168 hours. Cardiac Enzymes: No results for input(s): CKTOTAL, CKMB, CKMBINDEX, TROPONINI in the last 168 hours. BNP (last 3 results) No results for input(s): PROBNP in the last 8760 hours. HbA1C: No results for input(s): HGBA1C in the last 72 hours. CBG: Recent Labs  Lab 11/05/20 2347  GLUCAP 150*   Lipid Profile: No results for input(s): CHOL, HDL, LDLCALC, TRIG, CHOLHDL, LDLDIRECT in the last 72 hours. Thyroid Function Tests: No results for input(s): TSH, T4TOTAL, FREET4, T3FREE, THYROIDAB in the last 72 hours. Anemia Panel: No results for input(s): VITAMINB12, FOLATE, FERRITIN, TIBC, IRON, RETICCTPCT in the last 72 hours. Sepsis Labs: Recent Labs  Lab 11/06/20 2342 11/07/20 0629 11/07/20 0746  PROCALCITON 2.79  --   --   LATICACIDVEN  --  7.6* 6.8*    Recent Results (from the past 240 hour(s))  Resp Panel by RT-PCR (Flu A&B, Covid) Nasopharyngeal Swab     Status: None   Collection Time: 11/22/2020 10:03 PM   Specimen: Nasopharyngeal Swab; Nasopharyngeal(NP) swabs in vial transport medium  Result Value Ref Range Status   SARS Coronavirus 2 by RT PCR NEGATIVE NEGATIVE Final    Comment: (NOTE) SARS-CoV-2 target nucleic acids are NOT DETECTED.  The SARS-CoV-2 RNA is generally detectable in upper respiratory specimens during the acute phase of infection. The lowest concentration of SARS-CoV-2 viral copies this assay can detect is 138 copies/mL. A negative result does not preclude SARS-Cov-2 infection and should not be used as the sole basis for  treatment or other patient management decisions. A negative result may occur with  improper specimen collection/handling, submission of specimen other than nasopharyngeal swab, presence of viral mutation(s) within the areas targeted by this assay, and inadequate number of viral copies(<138 copies/mL). A negative result must be combined with clinical observations, patient history, and epidemiological information. The expected result is Negative.  Fact Sheet for Patients:  EntrepreneurPulse.com.au  Fact Sheet for Healthcare Providers:  IncredibleEmployment.be  This test is no t yet approved or cleared by the [place] FDA and  has been authorized for detection and/or diagnosis of SARS-CoV-2 by FDA under an Emergency Use Authorization (EUA). This EUA will remain  in effect (meaning this test can be used) for the duration of the COVID-19 declaration under Section 564(b)(1) of the Act, 21 U.S.C.section 360bbb-3(b)(1), unless the authorization is terminated  or revoked sooner.  Influenza A by PCR NEGATIVE NEGATIVE Final   Influenza B by PCR NEGATIVE NEGATIVE Final    Comment: (NOTE) The Xpert Xpress SARS-CoV-2/FLU/RSV plus assay is intended as an aid in the diagnosis of influenza from Nasopharyngeal swab specimens and should not be used as a sole basis for treatment. Nasal washings and aspirates are unacceptable for Xpert Xpress SARS-CoV-2/FLU/RSV testing.  Fact Sheet for Patients: EntrepreneurPulse.com.au  Fact Sheet for Healthcare Providers: IncredibleEmployment.be  This test is not yet approved or cleared by the [place] FDA and has been authorized for detection and/or diagnosis of SARS-CoV-2 by FDA under an Emergency Use Authorization (EUA). This EUA will remain in effect (meaning this test can be used) for the duration of the COVID-19 declaration under Section 564(b)(1) of the Act, 21  U.S.C. section 360bbb-3(b)(1), unless the authorization is terminated or revoked.  Performed at [facility], [address] [place], [place] [address]   CULTURE, BLOOD (ROUTINE X 2) w Reflex to ID Panel     Status: None (Preliminary result)   Collection Time: 11/07/20  6:29 AM   Specimen: BLOOD  Result Value Ref Range Status   Specimen Description BLOOD BLOOD RIGHT HAND  Final   Special Requests   Final    BOTTLES DRAWN AEROBIC AND ANAEROBIC Blood Culture results may not be optimal due to an inadequate volume of blood received in culture bottles   Culture   Final    NO GROWTH 2 DAYS Performed at [facility], [address] [place], [place] [address]    Report Status PENDING  Incomplete  CULTURE, BLOOD (ROUTINE X 2) w Reflex to ID Panel     Status: None (Preliminary result)   Collection Time: 11/07/20  6:29 AM   Specimen: BLOOD  Result Value Ref Range Status   Specimen Description BLOOD FINGER  Final   Special Requests   Final    BOTTLES DRAWN AEROBIC AND ANAEROBIC Blood Culture adequate volume   Culture   Final    NO GROWTH 2 DAYS Performed at [facility], [address], [place], [place] [address]    Report Status PENDING  Incomplete         Radiology Studies: ECHOCARDIOGRAM COMPLETE  Result Date: 11/07/2020    ECHOCARDIOGRAM REPORT   Patient Name:   David Perez Date of Exam: 11/07/2020 Medical Rec #:  [id number]          Height:       67.0 in Accession #:    [id number]         Weight:       146.0 lb Date of Birth:  [birth year]          BSA:          1.769 m Patient Age:    10 years           BP:           95/61 mmHg Patient Gender: M                  HR:           97 bpm. Exam Location:  [facility] Procedure: 2D Echo, Color Doppler, Cardiac Doppler and Strain Analysis Indications:     I50.9 Congestive Heart Failure  History:         Patient has prior history of Echocardiogram examinations, most                  recent 06/10/2020. CAD; Prior  CABG. Hepatitis B.  Sonographer:     Charmayne Sheer RDCS (AE) Referring Phys:  [id number] BRENDA MORRISON Diagnosing Phys: Kathlyn Sacramento MD  Sonographer Comments: Suboptimal parasternal window. Global longitudinal strain was attempted. IMPRESSIONS  1. Left ventricular ejection fraction, by estimation, is 25 to 30%. The left ventricle has severely decreased function. The left ventricle demonstrates regional wall motion abnormalities (see scoring diagram/findings for description). The left ventricular internal cavity size was moderately dilated. Left ventricular diastolic parameters are consistent with Grade II diastolic dysfunction (pseudonormalization). There is akinesis of the left ventricular, entire anteroseptal wall, anterior wall and apical segment. The average left ventricular global longitudinal strain is -5.5 %. The global longitudinal strain is abnormal.  2. Right ventricular systolic function is normal. The right ventricular size is normal. There is severely elevated pulmonary artery systolic pressure. The estimated right ventricular systolic pressure is 46.5 mmHg.  3. Left atrial size was mildly dilated.  4. The mitral valve is abnormal. Severe mitral valve regurgitation. No evidence of mitral stenosis. Moderate mitral annular calcification.  5. Tricuspid valve regurgitation is severe.  6. The aortic valve has been repaired/replaced. Aortic valve regurgitation is not visualized. No aortic stenosis is present. Aortic valve mean gradient measures 8.0 mmHg.  7. The inferior vena cava is normal in size with <50% respiratory variability, suggesting right atrial pressure of 8 mmHg. FINDINGS  Left Ventricle: Left ventricular ejection fraction, by estimation, is 25 to 30%. The left ventricle has severely decreased function. The left ventricle demonstrates regional wall motion abnormalities. The average left ventricular global longitudinal strain is -5.5 %. The global longitudinal strain is abnormal. The left  ventricular internal cavity size was moderately dilated. There is no left ventricular hypertrophy. Left ventricular diastolic parameters are consistent with Grade II diastolic dysfunction (pseudonormalization). Right Ventricle: The right ventricular size is normal. No increase in right ventricular wall thickness. Right ventricular systolic function is normal. There is severely elevated pulmonary artery systolic pressure. The tricuspid regurgitant velocity is 3.62 m/s, and with an assumed right atrial pressure of 8 mmHg, the estimated right ventricular systolic pressure is 03.5 mmHg. Left Atrium: Left atrial size was mildly dilated. Right Atrium: Right atrial size was normal in size. Pericardium: There is no evidence of pericardial effusion. Mitral Valve: The mitral valve is abnormal. Moderate mitral annular calcification. Severe mitral valve regurgitation. No evidence of mitral valve stenosis. MV peak gradient, 9.7 mmHg. The mean mitral valve gradient is 5.0 mmHg. Tricuspid Valve: The tricuspid valve is normal in structure. Tricuspid valve regurgitation is severe. No evidence of tricuspid stenosis. Aortic Valve: The aortic valve has been repaired/replaced. Aortic valve regurgitation is not visualized. No aortic stenosis is present. Aortic valve mean gradient measures 8.0 mmHg. Aortic valve peak gradient measures 14.1 mmHg. Aortic valve area, by VTI  measures 2.50 cm. 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 Pulmonic Valve: The pulmonic valve was normal in structure. Pulmonic valve regurgitation is not visualized. No evidence of pulmonic stenosis. Aorta: The aortic root is normal in size and structure. Venous: The inferior vena cava is normal in size with less than 50% respiratory variability, suggesting right atrial pressure of 8 mmHg. IAS/Shunts: No atrial level shunt detected by color flow Doppler.  LEFT VENTRICLE PLAX 2D LVIDd:         5.16 cm  Diastology LVIDs:         4.01 cm  LV e'  medial:    3.70 cm/s LV PW:  0.92 cm  LV E/e' medial:  34.6 LV IVS:        0.76 cm  LV e' lateral:   4.13 cm/s LVOT diam:     2.20 cm  LV E/e' lateral: 31.0 LV SV:         78 LV SV Index:   44       2D Longitudinal Strain LVOT Area:     3.80 cm 2D Strain GLS Avg:     -5.5 %                          3D Volume EF:                         3D EF:        40 %                         LV EDV:       147 ml                         LV ESV:       88 ml                         LV SV:        59 ml RIGHT VENTRICLE RV Basal diam:  3.44 cm LEFT ATRIUM           Index       RIGHT ATRIUM           Index LA diam:      2.90 cm 1.64 cm/m  RA Area:     16.00 cm LA Vol (A2C): 31.8 ml 17.98 ml/m RA Volume:   41.50 ml  23.46 ml/m LA Vol (A4C): 67.7 ml 38.27 ml/m  AORTIC VALVE                    PULMONIC VALVE AV Area (Vmax):    2.41 cm     PV Vmax:       0.88 m/s AV Area (Vmean):   2.32 cm     PV Vmean:      62.600 cm/s AV Area (VTI):     2.50 cm     PV VTI:        0.118 m AV Vmax:           188.00 cm/s  PV Peak grad:  3.1 mmHg AV Vmean:          130.000 cm/s PV Mean grad:  2.0 mmHg AV VTI:            0.312 m AV Peak Grad:      14.1 mmHg AV Mean Grad:      8.0 mmHg LVOT Vmax:         119.00 cm/s LVOT Vmean:        79.500 cm/s LVOT VTI:          0.205 m LVOT/AV VTI ratio: 0.66  AORTA Ao Root diam: 2.80 cm MITRAL VALVE                TRICUSPID VALVE MV Area (PHT): 8.16 cm     TR Peak grad:   52.4 mmHg MV Peak grad:  9.7 mmHg     TR Vmax:        362.00  cm/s MV Mean grad:  5.0 mmHg MV Vmax:       1.56 m/s     SHUNTS MV Vmean:      110.0 cm/s   Systemic VTI:  0.20 m MV Decel Time: 93 msec      Systemic Diam: 2.20 cm MV E velocity: 128.00 cm/s MV A velocity: 117.00 cm/s MV E/A ratio:  1.09 Kathlyn Sacramento MD Electronically signed by Kathlyn Sacramento MD Signature Date/Time: 11/07/2020/2:30:00 PM    Final         Scheduled Meds: . feeding supplement  1 Container Oral Q24H  . feeding supplement  237 mL Oral BID BM    Continuous Infusions: . HYDROmorphone 0.5 mg/hr (11/08/20 0944)     LOS: 4 days    Time spent: 20 minutes    Desma Maxim, MD [facility]   To contact the attending provider between 7A-7P or the covering provider during after hours 7P-7A, please log into the web site [email or web address] and access using universal [place] password for that web site. If you do not have the password, please call the hospital operator.  11/09/2020, 12:07 PM

## 2020-11-10 DIAGNOSIS — K117 Disturbances of salivary secretion: Secondary | ICD-10-CM

## 2020-11-10 MED ORDER — SCOPOLAMINE 1 MG/3DAYS TD PT72
1.0000 | MEDICATED_PATCH | TRANSDERMAL | Status: DC
  Administered 2020-11-10: 1.5 mg via TRANSDERMAL
  Filled 2020-11-10 (×2): qty 1

## 2020-11-10 NOTE — Progress Notes (Signed)
Nutrition Brief Note  Chart reviewed. Pt now transitioned to comfort care.  No further nutrition interventions warranted at this time.  Please re-consult as needed.   Koleen Distance MS, RD, LDN Please refer to [facility] for RD and/or RD on-call/weekend/after hours pager

## 2020-11-10 NOTE — Progress Notes (Signed)
Daily Progress Note   Patient Name: David Perez       Date: 11/10/2020 DOB: 02/04/1941  Age: 79 y.o. MRN#: [id number] Attending Physician: Gwynne Edinger, MD Primary Care Physician: Marval Regal, NP Admit Date: 11/18/2020  Reason for Consultation/Follow-up: Establishing goals of care and Terminal Care  Subjective: Patient unresponsive to sternal rub but appears comfortable on dilaudid gtt. Increased oropharyngeal secretions. RN to give prn robinul and will order scopolamine TD. Respirations shallow, regular.    GOC: Daughters, Armed forces operational officer at bedside. Reviewed comfort focused care plan and recommended leaving him inpatient, as he appears to be transitioning and nearing EOL. Daughters agree and again confirm plan for comfort. They do feel he looks comfortable and peaceful this morning. They are prepared for 'anything to happen at anytime.' They would appreciate visit from chaplain. Discussed symptom management medications. Encouraged daughters to call PMT phone with questions or concerns this afternoon.  Length of Stay: 5  Current Medications: Scheduled Meds:  . scopolamine  1 patch Transdermal Q72H    Continuous Infusions: . HYDROmorphone 0.5 mg/hr (11/08/20 0944)    PRN Meds: acetaminophen **OR** acetaminophen, bisacodyl, glycopyrrolate, haloperidol lactate, HYDROmorphone, LORazepam, menthol-cetylpyridinium, ondansetron **OR** ondansetron (ZOFRAN) IV  Physical Exam Vitals and nursing note reviewed.  Constitutional:      Appearance: He is cachectic. He is ill-appearing.  HENT:     Head: Normocephalic and atraumatic.  Cardiovascular:     Heart sounds: Normal heart sounds.  Pulmonary:     Effort: No tachypnea, accessory muscle usage or respiratory distress.      Breath sounds: Decreased breath sounds present.     Comments: Shallow, regular respiration. Periods of short apnea. Abdominal:     Tenderness: There is no abdominal tenderness.  Skin:    General: Skin is warm and dry.     Coloration: Skin is pale.  Neurological:     Mental Status: He is unresponsive.     Comments: Unresponsive to sternal rub. Comfortable            Vital Signs: BP (!) 88/64 (BP Location: Left Arm) Comment: report to RN  Pulse (!) 129   Temp 97.7 F (36.5 C) (Oral)   Resp 10   Ht 5\' 7"  (1.702 m)   Wt 59.4 kg Comment: low bed  SpO2 99%  BMI 20.52 kg/m  SpO2: SpO2: 99 % O2 Device: O2 Device: Nasal Cannula O2 Flow Rate: O2 Flow Rate (L/min): 8 L/min  Intake/output summary:   Intake/Output Summary (Last 24 hours) at 11/10/2020 1150 Last data filed at 11/10/2020 1046 Gross per 24 hour  Intake 0 ml  Output 100 ml  Net -100 ml   LBM: Last BM Date: 11/06/20 Baseline Weight: Weight: 45.8 kg Most recent weight: Weight:  (there is not a scale on the bed to weigh him)       Palliative Assessment/Data: PPS 10%    Flowsheet Rows   Flowsheet Row Most Recent Value  Intake Tab   Referral Department Hospitalist  Unit at Time of Referral Cardiac/Telemetry Unit  Palliative Care Primary Diagnosis Cardiac  Palliative Care Type New Palliative care  Reason for referral Clarify Goals of Care  Date first seen by Palliative Care 11/07/20  Clinical Assessment   Palliative Performance Scale Score 10%  Psychosocial & Spiritual Assessment   Palliative Care Outcomes   Patient/Family meeting held? Yes  Who was at the meeting? daughter  Palliative Care Outcomes Clarified goals of care, Counseled regarding hospice, Provided end of life care assistance, Improved pain interventions, Improved non-pain symptom therapy, Provided psychosocial or spiritual support, Changed to focus on comfort, Transitioned to hospice      Patient Active Problem List   Diagnosis Date Noted  .  Increased oropharyngeal secretions   . Aspiration pneumonia of both lower lobes due to gastric secretions ([place]) 11/07/2020  . Palliative care by specialist   . Acute embolic stroke ([place]) 99/83/3825  . Moderate pulmonary hypertension ([place]) 11/06/2020  . Anemia of chronic disease 11/06/2020  . Severe protein-calorie malnutrition Altamease Oiler: less than 60% of standard weight) ([place]) 11/06/2020  . AF (paroxysmal atrial fibrillation) ([place]) 11/06/2020  . Acute hypoxemic respiratory failure ([place]) 11/06/2020  . Hypotension 11/05/2020  . Failure to thrive in adult 11/05/2020  . Hypotension due to hypovolemia 11/03/2020  . Protein malnutrition ([place]) 11/24/2020  . Elevated liver enzymes 08/15/2020  . Weight loss 08/15/2020  . 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 Chronic anticoagulation 05/11/2020  . Terminal care 05/10/2020  . Pleural effusion 01/30/2020  . CHF (congestive heart failure) ([place])   . Leukocytosis   . Macrocytic anemia   . Acute systolic congestive heart failure ([place])   . AKI (acute kidney injury) ([place])   . SOB (shortness of breath)   . Acute on chronic systolic CHF (congestive heart failure) ([place])   . Debility 01/08/2020  . STEMI involving left main coronary artery ([place]) 12/31/2019  . STEMI (ST elevation myocardial infarction) ([place]) 12/31/2019  . 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 Family reports fall and weight loss. 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 On 12/6, patient developed severe hypoxemia, lactic acidosis. PCCM consulted. Chest CT reveals extensive bilateral emphysematous changes along with bilateral interstitial lung disease versus edema  versus aspiration pneumonia. Dr. Mortimer Fries spoke with daughters. Patient placed on BiPAP. DNR/DNI and no plans for transfer to ICU. IV morphine as needed. Palliative medicine consultation for goals of care/terminal care.    Assessment: Acute hypoxemic respiratory failure Likely aspiration pneumonia Acute on chronic systolic CHF with EF 05-39%  Severe sepsis with lactic acidosis Acute embolic stroke Paroxysmal atrial fibrillation Acute metabolic encephalopathy Emphysema/COPD Severe protein calorie malnutrition AKI Anemia of chronic disease  Recommendations/Plan:  Shifted to comfort measures only on 11/08/20. Discontinue interventions not aimed at comfort including BiPAP, cardiac monitor.   Symptom management  Dilaudid 0.5mg  continuous infusion  RN may bolus dilaudid via infusion 0.5-1mg  q69min prn pain/dyspnea/air hunger/tachypnea  Ativan 1mg  IV q4h prn anxiety  Haldol 2mg  IV q6h prn agitation  Robinul 0.2mg  IV q4h prn secretions  Scopolamine TD ordered  Comfort feeds per patient/family request. Daughters understand aspiration risk. Aspiration precautions.  Spiritual care consult  Unrestricted visitor access as patient is nearing EOL.   Do not recommend transfer to hospice facility. Anticipate hospital death.   Code Status: DNR/DNI   Code Status Orders  (From admission, onward)         Start     Ordered   11/05/20 1447  Do not attempt resuscitation (DNR)  Continuous       Question Answer Comment  In the event of cardiac or respiratory ARREST Do not call a "code blue"   In the event of cardiac or respiratory ARREST Do not perform Intubation, CPR, defibrillation or ACLS   In the event of cardiac or respiratory ARREST Use medication by any route, position, wound care, and other measures to relive pain and suffering. May use oxygen, suction and manual treatment of airway obstruction as needed for comfort.      11/05/20 1446        Code Status History    Date Active  Date Inactive Code Status Order ID Comments User Context   11/17/2020 2308 11/05/2020 1446 Full Code [id number]  [facility] Cedar, DO ED   01/08/2020 1817 01/29/2020 2017 Full Code [id number]  Flora Lipps Inpatient   12/31/2019 0411 01/08/2020 1809 Full Code [id number]  Larey Dresser, MD Inpatient   12/31/2019 0408 12/31/2019 0411 Full Code [id number]  Yolonda Kida, MD Inpatient   12/31/2019 0351 12/31/2019 0408 Full Code [id number]  Larey Dresser, MD Inpatient   Advance Care Planning Activity    Advance Directive Documentation     Most Recent Value  Type of Advance Directive Living will  Pre-existing out of facility DNR order (yellow form or pink MOST form) --  "MOST" Form in Place? --       Prognosis:  Days if not hours. Unstable for transfer to hospice facility. Appears to be getting closer to EOL.   Discharge Planning:  Anticipate hospital death  Care plan was discussed with daughters David Perez and David Perez), RN, Dr. Si Raider, hospice liaison  Thank you for allowing the Palliative Medicine Team to assist in the care of this patient.   Total Time 20 Prolonged Time Billed no    Greater than 50% of this time was spent counseling and coordinating care related to the above assessment and plan.   Ihor Dow, DNP, FNP-C Palliative Medicine Team  Phone: [phone] Fax: [phone]  Please contact Palliative Medicine Team phone at [phone] for questions and concerns.

## 2020-11-10 NOTE — Progress Notes (Signed)
[facility] Liaison note:  Follow up visit made to new referral for [facility] hospice home. Patient noted with 45 second periods of apnea. Daughters at bedside. Emotional support given.  Per Palliative NP Ihor Dow a hospital death is now expected.  Liaison will continue to follow and provide support to family. Referral notified. Flo Shanks BSN, RN, [place] [phone]

## 2020-11-10 NOTE — Progress Notes (Signed)
PROGRESS NOTE    David Perez  GQQ:[id number] DOB: 04/13/1941 DOA: 11/03/2020 PCP: Marval Regal, NP   Chief complaint.  Shortness of breath. 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 He was sent from hematology clinic with hypotension. Discussed with patient daughter, patient had a fall about 5 days ago. He has generalized weakness. He occasionally confused. He has a poor p.o. intake, he has lost 20 pounds of body weight. He also has some loose stools. He currently lives alone at home. When I see the patient today, he appeared to have some confusion. Speech is slurred. But no weakness in the legs or arms. No facial droop. He had a CT scan of abdomen/pelvis in the emergency room, did not see any acute changes.Hb7.7. 12/5.MRI of the brain showed acute stroke in the right parietal lobe and the left cerebellar. 12/6.  Patient developed severe hypoxemia, lactic acidosis, patient is seen by pulmonology, started palliative care.   Assessment & Plan:   Principal Problem:   Hypotension due to hypovolemia Active Problems:   S/P CABG x 3   Debility   Acute on chronic systolic CHF (congestive heart failure) (HCC)   Macrocytic anemia   AKI (acute kidney injury) ([place])   Terminal care   Chronic anticoagulation   Weight loss   Protein malnutrition (HCC)   Hypotension   Failure to thrive in adult   Acute embolic stroke (HCC)   Moderate pulmonary hypertension ([place])   Anemia of chronic disease   Severe protein-calorie malnutrition Altamease Oiler: less than 60% of standard weight) (HCC)   AF (paroxysmal atrial fibrillation) (HCC)   Acute hypoxemic respiratory failure (HCC)   Aspiration pneumonia of both lower lobes due to gastric secretions (HCC)   Palliative care by specialist   Increased oropharyngeal secretions  End of life care Transitioned to comfort care, now  asleep with asynchronous breathing, appears comfortable on dilaudid gtt. Plan for inpatient hospice vs death in hospital, no hospice bed available today and hospice now things unstable for transfer so likely remain here until passes. On dilaudid gtt. Updated both daughters at bedside 12/8, answered all questions.    Subjective: Asleep, appears comfortable  Objective: Vitals:   11/09/20 0814 11/09/20 2035 11/10/20 0512 11/10/20 0843  BP: 91/61 100/71 112/72 (!) 88/64  Pulse: (!) 136 (!) 134 (!) 133 (!) 129  Resp: 16   10  Temp: 97.8 F (36.6 C) (!) 97.5 F (36.4 C) (!) 97.5 F (36.4 C) 97.7 F (36.5 C)  TempSrc: Oral Oral Oral Oral  SpO2: (!) 88% 97% 98% 99%  Weight:      Height:        Intake/Output Summary (Last 24 hours) at 11/10/2020 1502 Last data filed at 11/10/2020 1341 Gross per 24 hour  Intake 0 ml  Output 100 ml  Net -100 ml   Filed Weights   11/08/20 0518  Weight: 59.4 kg    Examination:  General exam: Ill-appearing, cachectic, severe muscle atrophy. Respiratory system: Decreased breathing sounds. Asynchronous breathing Cardiovascular system: Regular and tachycardic no JVD, murmurs, rubs, gallops or clicks. No pedal edema. Gastrointestinal system: Abdomen is nondistended, soft and nontender.  Central nervous system: asleep Extremities: Symmetric  Skin: No rashes, lesions or ulcers. warm     Data Reviewed: I have personally reviewed following labs and imaging studies  CBC: Recent Labs  Lab 11/11/2020 1358 11/05/20 0553 11/06/20 0515 11/07/20 0303 11/08/20 6712  WBC 6.5 6.8 9.0 16.1* 14.9*  NEUTROABS  --   --  7.3 12.9* 13.1*  HGB 8.6* 7.7* 8.2* 9.1* 8.9*  HCT 26.2* 23.1* 24.3* 28.6* 26.5*  MCV 109.2* 109.5* 105.7* 112.2* 107.3*  PLT 311 275 278 335 355   Basic Metabolic Panel: Recent Labs  Lab 11/08/2020 1358 11/05/20 0553 11/07/20 0303 11/08/20 0426  NA 138 138 137 145  K 4.6 3.7 4.4 3.6  CL 104 110 109 109  CO2 25 22 13* 19*  GLUCOSE  124* 94 303* 177*  BUN 28* 22 27* 46*  CREATININE 1.35* 1.00 1.42* 2.06*  CALCIUM 9.4 7.7* 8.2* 8.5*  MG  --   --  2.2 2.2  PHOS  --   --   --  5.0*   GFR: Estimated Creatinine Clearance: 23.6 mL/min (A) (by C-G formula based on SCr of 2.06 mg/dL (H)). Liver Function Tests: Recent Labs  Lab 11/07/20 0303  AST 42*  ALT 49*  ALKPHOS 67  BILITOT 1.3*  PROT 6.8  ALBUMIN 3.0*   No results for input(s): LIPASE, AMYLASE in the last 168 hours. No results for input(s): AMMONIA in the last 168 hours. Coagulation Profile: No results for input(s): INR, PROTIME in the last 168 hours. Cardiac Enzymes: No results for input(s): CKTOTAL, CKMB, CKMBINDEX, TROPONINI in the last 168 hours. BNP (last 3 results) No results for input(s): PROBNP in the last 8760 hours. HbA1C: No results for input(s): HGBA1C in the last 72 hours. CBG: Recent Labs  Lab 11/05/20 2347  GLUCAP 150*   Lipid Profile: No results for input(s): CHOL, HDL, LDLCALC, TRIG, CHOLHDL, LDLDIRECT in the last 72 hours. Thyroid Function Tests: No results for input(s): TSH, T4TOTAL, FREET4, T3FREE, THYROIDAB in the last 72 hours. Anemia Panel: No results for input(s): VITAMINB12, FOLATE, FERRITIN, TIBC, IRON, RETICCTPCT in the last 72 hours. Sepsis Labs: Recent Labs  Lab 11/06/20 2342 11/07/20 0629 11/07/20 0746  PROCALCITON 2.79  --   --   LATICACIDVEN  --  7.6* 6.8*    Recent Results (from the past 240 hour(s))  Resp Panel by RT-PCR (Flu A&B, Covid) Nasopharyngeal Swab     Status: None   Collection Time: 11/21/2020 10:03 PM   Specimen: Nasopharyngeal Swab; Nasopharyngeal(NP) swabs in vial transport medium  Result Value Ref Range Status   SARS Coronavirus 2 by RT PCR NEGATIVE NEGATIVE Final    Comment: (NOTE) SARS-CoV-2 target nucleic acids are NOT DETECTED.  The SARS-CoV-2 RNA is generally detectable in upper respiratory specimens during the acute phase of infection. The lowest concentration of SARS-CoV-2 viral  copies this assay can detect is 138 copies/mL. A negative result does not preclude SARS-Cov-2 infection and should not be used as the sole basis for treatment or other patient management decisions. A negative result may occur with  improper specimen collection/handling, submission of specimen other than nasopharyngeal swab, presence of viral mutation(s) within the areas targeted by this assay, and inadequate number of viral copies(<138 copies/mL). A negative result must be combined with clinical observations, patient history, and epidemiological information. The expected result is Negative.  Fact Sheet for Patients:  EntrepreneurPulse.com.au  Fact Sheet for Healthcare Providers:  IncredibleEmployment.be  This test is no t yet approved or cleared by the [place] FDA and  has been authorized for detection and/or diagnosis of SARS-CoV-2 by FDA under an Emergency Use Authorization (EUA). This EUA will remain  in effect (meaning this test can be used) for the duration of the COVID-19 declaration under Section  564(b)(1) of the Act, 21 U.S.C.section 360bbb-3(b)(1), unless the authorization is terminated  or revoked sooner.       Influenza A by PCR NEGATIVE NEGATIVE Final   Influenza B by PCR NEGATIVE NEGATIVE Final    Comment: (NOTE) The Xpert Xpress SARS-CoV-2/FLU/RSV plus assay is intended as an aid in the diagnosis of influenza from Nasopharyngeal swab specimens and should not be used as a sole basis for treatment. Nasal washings and aspirates are unacceptable for Xpert Xpress SARS-CoV-2/FLU/RSV testing.  Fact Sheet for Patients: EntrepreneurPulse.com.au  Fact Sheet for Healthcare Providers: IncredibleEmployment.be  This test is not yet approved or cleared by the [place] FDA and has been authorized for detection and/or diagnosis of SARS-CoV-2 by FDA under an Emergency Use Authorization (EUA). This  EUA will remain in effect (meaning this test can be used) for the duration of the COVID-19 declaration under Section 564(b)(1) of the Act, 21 U.S.C. section 360bbb-3(b)(1), unless the authorization is terminated or revoked.  Performed at [facility], [address] [place], [place] [address]   CULTURE, BLOOD (ROUTINE X 2) w Reflex to ID Panel     Status: None (Preliminary result)   Collection Time: 11/07/20  6:29 AM   Specimen: BLOOD  Result Value Ref Range Status   Specimen Description BLOOD BLOOD RIGHT HAND  Final   Special Requests   Final    BOTTLES DRAWN AEROBIC AND ANAEROBIC Blood Culture results may not be optimal due to an inadequate volume of blood received in culture bottles   Culture   Final    NO GROWTH 3 DAYS Performed at [facility], [address], [place], [place] [address]    Report Status PENDING  Incomplete  CULTURE, BLOOD (ROUTINE X 2) w Reflex to ID Panel     Status: None (Preliminary result)   Collection Time: 11/07/20  6:29 AM   Specimen: BLOOD  Result Value Ref Range Status   Specimen Description BLOOD FINGER  Final   Special Requests   Final    BOTTLES DRAWN AEROBIC AND ANAEROBIC Blood Culture adequate volume   Culture   Final    NO GROWTH 3 DAYS Performed at [facility], [address], [place], [place] [address]    Report Status PENDING  Incomplete         Radiology Studies: No results found.      Scheduled Meds: . scopolamine  1 patch Transdermal Q72H   Continuous Infusions: . HYDROmorphone 0.5 mg/hr (11/10/20 1221)     LOS: 5 days    Time spent: 20 minutes    Desma Maxim, MD [facility]   To contact the attending provider between 7A-7P or the covering provider during after hours 7P-7A, please log into the web site [email or web address] and access using universal [place] password for that web site. If you do not have the password, please call the hospital operator.  11/10/2020, 3:02 PM

## 2020-11-10 NOTE — Progress Notes (Signed)
RN spoke with patient's daughter([name]) and made aware that patient was transferred to [facility] unit Room 207. Daughter stated that she was already informed and thankful for the call.  Thresa Ross

## 2020-11-11 NOTE — Progress Notes (Deleted)
Tele called stating that the patient had a run of SVT with a HR of 178. NP Ouma notified. No new orders at this time. Patient resting in bed with no complaints.

## 2020-11-11 NOTE — Care Management Important Message (Signed)
Important Message  Patient Details  Name: David Perez MRN: [id number] Date of Birth: 1941-03-16   Medicare Important Message Given:  Other (see comment)  Patient on comfort care measures.  Concurrent Medicare IM not given at this time out of respect for patient and family.   Dannette Barbara 11/11/2020, 2:55 PM

## 2020-11-11 NOTE — Progress Notes (Signed)
Daily Progress Note   Patient Name: David Perez       Date: 11/11/2020 DOB: 02/26/1941  Age: 79 y.o. MRN#: [id number] Attending Physician: Gwynne Edinger, MD Primary Care Physician: Marval Regal, NP Admit Date: 11/02/2020  Reason for Consultation/Follow-up: Establishing goals of care and Terminal Care  Subjective: Patient unresponsive to sternal rub but appears comfortable on dilaudid gtt. Shallow, irregular respirations. Periods of apnea.   Daughter, [name] at bedside. Provided update and answered questions regarding EOL care. [name] has PMT contact information. She feels he looks very peaceful and comfortable and appreciative of visit.    Length of Stay: 6  Current Medications: Scheduled Meds:  . scopolamine  1 patch Transdermal Q72H    Continuous Infusions: . HYDROmorphone 0.5 mg/hr (11/10/20 1221)    PRN Meds: acetaminophen **OR** acetaminophen, bisacodyl, glycopyrrolate, haloperidol lactate, HYDROmorphone, LORazepam, menthol-cetylpyridinium, ondansetron **OR** ondansetron (ZOFRAN) IV  Physical Exam Vitals and nursing note reviewed.  Constitutional:      Appearance: He is cachectic. He is ill-appearing.  HENT:     Head: Normocephalic and atraumatic.  Cardiovascular:     Heart sounds: Normal heart sounds.  Pulmonary:     Effort: No tachypnea, accessory muscle usage or respiratory distress.     Breath sounds: Decreased breath sounds present.     Comments: Shallow, irregular respirations. Periods of apnea. Abdominal:     Tenderness: There is no abdominal tenderness.  Skin:    General: Skin is warm and dry.     Coloration: Skin is pale.  Neurological:     Mental Status: He is unresponsive.     Comments: Unresponsive to sternal rub. Comfortable             Vital Signs: BP (!) 81/55 (BP Location: Left Arm)   Pulse (!) 133   Temp 98 F (36.7 C) (Oral)   Resp 14   Ht 5\' 7"  (1.702 m)   Wt 59.4 kg Comment: low bed  SpO2 (!) 72%   BMI 20.52 kg/m  SpO2: SpO2: (!) 72 % O2 Device: O2 Device: Nasal Cannula O2 Flow Rate: O2 Flow Rate (L/min): 2 L/min  Intake/output summary:   Intake/Output Summary (Last 24 hours) at 11/11/2020 0914 Last data filed at 11/11/2020 0600 Gross per 24 hour  Intake 0 ml  Output 150 ml  Net -  150 ml   LBM: Last BM Date: 11/06/20 Baseline Weight: Weight: 45.8 kg Most recent weight: Weight:  (there is not a scale on the bed to weigh him)       Palliative Assessment/Data: PPS 10%    Flowsheet Rows   Flowsheet Row Most Recent Value  Intake Tab   Referral Department Hospitalist  Unit at Time of Referral Cardiac/Telemetry Unit  Palliative Care Primary Diagnosis Cardiac  Palliative Care Type New Palliative care  Reason for referral Clarify Goals of Care  Date first seen by Palliative Care 11/07/20  Clinical Assessment   Palliative Performance Scale Score 10%  Psychosocial & Spiritual Assessment   Palliative Care Outcomes   Patient/Family meeting held? Yes  Who was at the meeting? daughter  Palliative Care Outcomes Clarified goals of care, Counseled regarding hospice, Provided end of life care assistance, Improved pain interventions, Improved non-pain symptom therapy, Provided psychosocial or spiritual support, Changed to focus on comfort, Transitioned to hospice      Patient Active Problem List   Diagnosis Date Noted  . Increased oropharyngeal secretions   . Aspiration pneumonia of both lower lobes due to gastric secretions ([place]) 11/07/2020  . Palliative care by specialist   . Acute embolic stroke ([place]) 83/41/9622  . Moderate pulmonary hypertension ([place]) 11/06/2020  . Anemia of chronic disease 11/06/2020  . Severe protein-calorie malnutrition Altamease Oiler: less than 60% of standard weight) ([place])  11/06/2020  . AF (paroxysmal atrial fibrillation) ([place]) 11/06/2020  . Acute hypoxemic respiratory failure ([place]) 11/06/2020  . Hypotension 11/05/2020  . Failure to thrive in adult 11/05/2020  . Hypotension due to hypovolemia 11/03/2020  . Protein malnutrition ([place]) 11/07/2020  . Elevated liver enzymes 08/15/2020  . Weight loss 08/15/2020  . 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 Chronic anticoagulation 05/11/2020  . Terminal care 05/10/2020  . Pleural effusion 01/30/2020  . CHF (congestive heart failure) ([place])   . Leukocytosis   . Macrocytic anemia   . Acute systolic congestive heart failure ([place])   . AKI (acute kidney injury) ([place])   . SOB (shortness of breath)   . Acute on chronic systolic CHF (congestive heart failure) ([place])   . Debility 01/08/2020  . STEMI involving left main coronary artery ([place]) 12/31/2019  . STEMI (ST elevation myocardial infarction) ([place]) 12/31/2019  . 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 Family reports fall and weight loss. 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 On 12/6, patient developed severe hypoxemia, lactic acidosis. PCCM consulted. Chest CT reveals extensive bilateral emphysematous changes along with bilateral interstitial lung disease versus edema versus aspiration pneumonia. Dr. Mortimer Fries spoke with daughters. Patient placed on BiPAP. DNR/DNI and no plans for transfer to ICU. IV morphine as needed. Palliative medicine consultation for goals of care/terminal care.    Assessment: Acute hypoxemic respiratory failure Likely aspiration pneumonia Acute on chronic systolic CHF with EF 29-79% Severe sepsis with lactic  acidosis Acute embolic stroke Paroxysmal atrial fibrillation Acute metabolic encephalopathy Emphysema/COPD Severe protein calorie malnutrition AKI Anemia of chronic disease  Recommendations/Plan:  Shifted to comfort measures only on 11/08/20. Discontinue interventions not aimed at comfort including BiPAP, cardiac monitor.   Symptom management  Dilaudid 0.5mg  continuous infusion  RN may  bolus dilaudid via infusion 0.5-1mg  q23min prn pain/dyspnea/air hunger/tachypnea  Ativan 1mg  IV q4h prn anxiety  Haldol 2mg  IV q6h prn agitation  Robinul 0.2mg  IV q4h prn secretions  Scopolamine TD ordered  Comfort feeds per patient/family request. Daughters understand aspiration risk. Aspiration precautions.  Spiritual care consult  Unrestricted visitor access as patient is nearing EOL.   Do not recommend transfer to hospice facility. Anticipate hospital death.   Code Status: DNR/DNI   Code Status Orders  (From admission, onward)         Start     Ordered   11/05/20 1447  Do not attempt resuscitation (DNR)  Continuous       Question Answer Comment  In the event of cardiac or respiratory ARREST Do not call a "code blue"   In the event of cardiac or respiratory ARREST Do not perform Intubation, CPR, defibrillation or ACLS   In the event of cardiac or respiratory ARREST Use medication by any route, position, wound care, and other measures to relive pain and suffering. May use oxygen, suction and manual treatment of airway obstruction as needed for comfort.      11/05/20 1446        Code Status History    Date Active Date Inactive Code Status Order ID Comments User Context   11/06/2020 2308 11/05/2020 1446 Full Code [id number]  [facility], DO ED   01/08/2020 1817 01/29/2020 2017 Full Code [id number]  Flora Lipps Inpatient   12/31/2019 0411 01/08/2020 1809 Full Code [id number]  Larey Dresser, MD Inpatient   12/31/2019 0408 12/31/2019 0411 Full Code [id number]  Yolonda Kida, MD  Inpatient   12/31/2019 0351 12/31/2019 0408 Full Code [id number]  Larey Dresser, MD Inpatient   Advance Care Planning Activity    Advance Directive Documentation     Most Recent Value  Type of Advance Directive Living will  Pre-existing out of facility DNR order (yellow form or pink MOST form) --  "MOST" Form in Place? --       Prognosis:  Days if not hours. Unstable for transfer to hospice facility. Appears to be getting closer to EOL.   Discharge Planning:  Anticipate hospital death  Care plan was discussed with daughter [name]), RN  Thank you for allowing the Palliative Medicine Team to assist in the care of this patient.   Total Time 15 Prolonged Time Billed no    Greater than 50% of this time was spent counseling and coordinating care related to the above assessment and plan.   Ihor Dow, DNP, FNP-C Palliative Medicine Team  Phone: [phone] Fax: [phone]  Please contact Palliative Medicine Team phone at [phone] for questions and concerns.

## 2020-11-11 NOTE — Progress Notes (Signed)
Manufacturing engineer hospital Liaison note:  Follow up visit made to new referral for [facility] home. Patient is now too unstable for transport and Hospital death is expected. He does appear comfortable with symptoms managed on continuous dilaudid infusion, no boluses given overnight. Mouth care provided. Daughter Langley Gauss and [place] at bedside. Emotional support provided. Flo Shanks BSN, RN, [place] [phone]

## 2020-11-11 NOTE — Progress Notes (Signed)
PROGRESS NOTE    David Perez  IEP:[id number] DOB: 30-Jun-1941 DOA: 11/10/2020 PCP: Marval Regal, NP   Chief complaint.  Shortness of breath. 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 He was sent from hematology clinic with hypotension. Discussed with patient daughter, patient had a fall about 5 days ago. He has generalized weakness. He occasionally confused. He has a poor p.o. intake, he has lost 20 pounds of body weight. He also has some loose stools. He currently lives alone at home. When I see the patient today, he appeared to have some confusion. Speech is slurred. But no weakness in the legs or arms. No facial droop. He had a CT scan of abdomen/pelvis in the emergency room, did not see any acute changes.Hb7.7. 12/5.MRI of the brain showed acute stroke in the right parietal lobe and the left cerebellar. 12/6.  Patient developed severe hypoxemia, lactic acidosis, patient is seen by pulmonology, started palliative care.   Assessment & Plan:   Principal Problem:   Hypotension due to hypovolemia Active Problems:   S/P CABG x 3   Debility   Acute on chronic systolic CHF (congestive heart failure) (HCC)   Macrocytic anemia   AKI (acute kidney injury) ([place])   Terminal care   Chronic anticoagulation   Weight loss   Protein malnutrition (H[facility])   Hypotension   Failure to thrive in adult   Acute embolic stroke (HCC)   Moderate pulmonary hypertension ([place])   Anemia of chronic disease   Severe protein-calorie malnutrition Altamease Oiler: less than 60% of standard weight) (HCC)   AF (paroxysmal atrial fibrillation) (HCC)   Acute hypoxemic respiratory failure (H[facility])   Aspiration pneumonia of both lower lobes due to gastric secretions (HCC)   Palliative care by specialist   Increased oropharyngeal secretions  End of life care Transitioned to comfort care, now  asleep with asynchronous breathing, appears comfortable on dilaudid gtt. Seen by palliative this morning and daughter updated @ bedside, was not there when I rounded. Unstable for transfer to inpt hospice so plan now is in-hospital death. On dilaudid gtt, appears comfortable.  Subjective: Asleep  Objective: Vitals:   11/10/20 0843 11/10/20 1935 11/10/20 2000 11/11/20 0815  BP: (!) 88/64 (!) 92/57  (!) 81/55  Pulse: (!) 129 (!) 103  (!) 133  Resp: 10 16  14   Temp: 97.7 F (36.5 C) 99.5 F (37.5 C)  98 F (36.7 C)  TempSrc: Oral   Oral  SpO2: 99% (!) 80% 90% (!) 72%  Weight:      Height:        Intake/Output Summary (Last 24 hours) at 11/11/2020 1415 Last data filed at 11/11/2020 0600 Gross per 24 hour  Intake 0 ml  Output 150 ml  Net -150 ml   Filed Weights   11/08/20 0518  Weight: 59.4 kg    Examination:  General exam: Ill-appearing, cachectic, severe muscle atrophy. Respiratory system: Decreased breathing sounds. Asynchronous breathing Cardiovascular system: Regular and tachycardic no JVD, murmurs, rubs, gallops or clicks. No pedal edema. Gastrointestinal system: Abdomen is nondistended, soft and nontender.  Central nervous system: asleep Extremities: Symmetric  Skin: No rashes, lesions or ulcers. warm     Data Reviewed: I have personally reviewed following labs and imaging studies  CBC: Recent Labs  Lab 11/05/20 0553 11/06/20 0515 11/07/20 0303 11/08/20 0426  WBC 6.8 9.0 16.1* 14.9*  NEUTROABS  --  7.3 12.9* 13.1*  HGB 7.7* 8.2* 9.1* 8.9*  HCT 23.1* 24.3* 28.6* 26.5*  MCV 109.5* 105.7* 112.2* 107.3*  PLT 275 278 335 161   Basic Metabolic Panel: Recent Labs  Lab 11/05/20 0553 11/07/20 0303 11/08/20 0426  NA 138 137 145  K 3.7 4.4 3.6  CL 110 109 109  CO2 22 13* 19*  GLUCOSE 94 303* 177*  BUN 22 27* 46*  CREATININE 1.00 1.42* 2.06*  CALCIUM 7.7* 8.2* 8.5*  MG  --  2.2 2.2  PHOS  --   --  5.0*   GFR: Estimated Creatinine Clearance: 23.6  mL/min (A) (by C-G formula based on SCr of 2.06 mg/dL (H)). Liver Function Tests: Recent Labs  Lab 11/07/20 0303  AST 42*  ALT 49*  ALKPHOS 67  BILITOT 1.3*  PROT 6.8  ALBUMIN 3.0*   No results for input(s): LIPASE, AMYLASE in the last 168 hours. No results for input(s): AMMONIA in the last 168 hours. Coagulation Profile: No results for input(s): INR, PROTIME in the last 168 hours. Cardiac Enzymes: No results for input(s): CKTOTAL, CKMB, CKMBINDEX, TROPONINI in the last 168 hours. BNP (last 3 results) No results for input(s): PROBNP in the last 8760 hours. HbA1C: No results for input(s): HGBA1C in the last 72 hours. CBG: Recent Labs  Lab 11/05/20 2347  GLUCAP 150*   Lipid Profile: No results for input(s): CHOL, HDL, LDLCALC, TRIG, CHOLHDL, LDLDIRECT in the last 72 hours. Thyroid Function Tests: No results for input(s): TSH, T4TOTAL, FREET4, T3FREE, THYROIDAB in the last 72 hours. Anemia Panel: No results for input(s): VITAMINB12, FOLATE, FERRITIN, TIBC, IRON, RETICCTPCT in the last 72 hours. Sepsis Labs: Recent Labs  Lab 11/06/20 2342 11/07/20 0629 11/07/20 0746  PROCALCITON 2.79  --   --   LATICACIDVEN  --  7.6* 6.8*    Recent Results (from the past 240 hour(s))  Resp Panel by RT-PCR (Flu A&B, Covid) Nasopharyngeal Swab     Status: None   Collection Time: 11/23/2020 10:03 PM   Specimen: Nasopharyngeal Swab; Nasopharyngeal(NP) swabs in vial transport medium  Result Value Ref Range Status   SARS Coronavirus 2 by RT PCR NEGATIVE NEGATIVE Final    Comment: (NOTE) SARS-CoV-2 target nucleic acids are NOT DETECTED.  The SARS-CoV-2 RNA is generally detectable in upper respiratory specimens during the acute phase of infection. The lowest concentration of SARS-CoV-2 viral copies this assay can detect is 138 copies/mL. A negative result does not preclude SARS-Cov-2 infection and should not be used as the sole basis for treatment or other patient management decisions. A  negative result may occur with  improper specimen collection/handling, submission of specimen other than nasopharyngeal swab, presence of viral mutation(s) within the areas targeted by this assay, and inadequate number of viral copies(<138 copies/mL). A negative result must be combined with clinical observations, patient history, and epidemiological information. The expected result is Negative.  Fact Sheet for Patients:  EntrepreneurPulse.com.au  Fact Sheet for Healthcare Providers:  IncredibleEmployment.be  This test is no t yet approved or cleared by the [place] FDA and  has been authorized for detection and/or diagnosis of SARS-CoV-2 by FDA under an Emergency Use Authorization (EUA). This EUA will remain  in effect (meaning this test can be used) for the duration of the COVID-19 declaration under Section 564(b)(1) of the Act, 21 U.S.C.section 360bbb-3(b)(1), unless the authorization is terminated  or revoked sooner.       Influenza A by PCR NEGATIVE NEGATIVE Final   Influenza B by PCR NEGATIVE NEGATIVE Final  Comment: (NOTE) The Xpert Xpress SARS-CoV-2/FLU/RSV plus assay is intended as an aid in the diagnosis of influenza from Nasopharyngeal swab specimens and should not be used as a sole basis for treatment. Nasal washings and aspirates are unacceptable for Xpert Xpress SARS-CoV-2/FLU/RSV testing.  Fact Sheet for Patients: EntrepreneurPulse.com.au  Fact Sheet for Healthcare Providers: IncredibleEmployment.be  This test is not yet approved or cleared by the [place] FDA and has been authorized for detection and/or diagnosis of SARS-CoV-2 by FDA under an Emergency Use Authorization (EUA). This EUA will remain in effect (meaning this test can be used) for the duration of the COVID-19 declaration under Section 564(b)(1) of the Act, 21 U.S.C. section 360bbb-3(b)(1), unless the authorization  is terminated or revoked.  Performed at [facility], [address] [place], [place] [address]   CULTURE, BLOOD (ROUTINE X 2) w Reflex to ID Panel     Status: None (Preliminary result)   Collection Time: 11/07/20  6:29 AM   Specimen: BLOOD  Result Value Ref Range Status   Specimen Description BLOOD BLOOD RIGHT HAND  Final   Special Requests   Final    BOTTLES DRAWN AEROBIC AND ANAEROBIC Blood Culture results may not be optimal due to an inadequate volume of blood received in culture bottles   Culture   Final    NO GROWTH 4 DAYS Performed at [facility], [address], [place], [place] [address]    Report Status PENDING  Incomplete  CULTURE, BLOOD (ROUTINE X 2) w Reflex to ID Panel     Status: None (Preliminary result)   Collection Time: 11/07/20  6:29 AM   Specimen: BLOOD  Result Value Ref Range Status   Specimen Description BLOOD FINGER  Final   Special Requests   Final    BOTTLES DRAWN AEROBIC AND ANAEROBIC Blood Culture adequate volume   Culture   Final    NO GROWTH 4 DAYS Performed at [facility], [address], [place], [place] [address]    Report Status PENDING  Incomplete         Radiology Studies: No results found.      Scheduled Meds: . scopolamine  1 patch Transdermal Q72H   Continuous Infusions: . HYDROmorphone 0.5 mg/hr (11/10/20 1221)     LOS: 6 days    Time spent: 15 minutes    Desma Maxim, MD [facility]   To contact the attending provider between 7A-7P or the covering provider during after hours 7P-7A, please log into the web site [email or web address] and access using universal [place] password for that web site. If you do not have the password, please call the hospital operator.  11/11/2020, 2:15 PM

## 2020-11-12 LAB — CULTURE, BLOOD (ROUTINE X 2)
Culture: NO GROWTH
Culture: NO GROWTH
Special Requests: ADEQUATE

## 2020-11-14 ENCOUNTER — Encounter: Payer: Self-pay | Admitting: *Deleted

## 2020-11-14 NOTE — Progress Notes (Signed)
Cardiac Individual Treatment Plan  Patient Details  Name: David Perez MRN: [id number] Date of Birth: Feb 14, 1941 Referring Provider:   April Manson Pulmonary Rehab from 09/07/2020 in [facility] Cardiac and Pulmonary Rehab  Referring Provider Loralie Champagne MD      Initial Encounter Date:  Flowsheet Row Pulmonary Rehab from 09/07/2020 in [facility] Cardiac and Pulmonary Rehab  Date 09/07/20      Visit Diagnosis: No diagnosis found.  Patient's Home Medications on Admission:  Current Outpatient Medications:  .  apixaban (ELIQUIS) 2.5 MG TABS tablet, Take 1 tablet (2.5 mg total) by mouth every 12 (twelve) hours., Disp: 180 tablet, Rfl: 3 .  atorvastatin (LIPITOR) 80 MG tablet, Take 1 tablet (80 mg total) by mouth daily at 6 PM., Disp: 90 tablet, Rfl: 3 .  carvedilol (COREG) 6.25 MG tablet, Take 1 tablet (6.25 mg total) by mouth 2 (two) times daily., Disp: 60 tablet, Rfl: 11 .  megestrol (MEGACE) 20 MG tablet, Take 1 tablet (20 mg total) by mouth daily., Disp: 30 tablet, Rfl: 4 .  sacubitril-valsartan (ENTRESTO) 49-51 MG, Take 1 tablet by mouth 2 (two) times daily., Disp: 180 tablet, Rfl: 1 .  spironolactone (ALDACTONE) 25 MG tablet, Take 1 tablet (25 mg total) by mouth at bedtime., Disp: 90 tablet, Rfl: 3  Past Medical History: Past Medical History:  Diagnosis Date  . 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 Hepatitis B     Tobacco Use: Social History   Tobacco Use  Smoking Status Former Smoker  . Packs/day: 0.50  . Years: 40.00  . Pack years: 20.00  . Quit date: 56  . Years since quitting: 25.9  Smokeless Tobacco Never Used    Labs: Recent Chemical engineer    Labs for ITP Cardiac and Pulmonary Rehab Latest Ref Rng & Units 01/06/2020 01/06/2020 01/07/2020 05/10/2020 08/15/2020   Cholestrol 0 - 200 mg/dL - - - 137 122   LDLCALC 0 - 99 mg/dL - - - 76 65   HDL >39.00 mg/dL - - - 48.60 47.90   Trlycerides 0.0 - 149.0 mg/dL - - - 58.0 47.0   Hemoglobin A1c 4.8 -  5.6 % - - - - -   PHART 7.350 - 7.450 - - - - -   PCO2ART 32.0 - 48.0 mmHg - - - - -   HCO3 20.0 - 28.0 mmol/L - - - - -   TCO2 22 - 32 mmol/L - - - - -   ACIDBASEDEF 0.0 - 2.0 mmol/L - - - - -   O2SAT % 52.7 58.4 57.3 - -       Exercise Target Goals: Exercise Program Goal: Individual exercise prescription set using results from initial 6 min walk test and THRR while considering  patient's activity barriers and safety.   Exercise Prescription Goal: Initial exercise prescription builds to 30-45 minutes a day of aerobic activity, 2-3 days per week.  Home exercise guidelines will be given to patient during program as part of exercise prescription that the participant will acknowledge.   Education: Aerobic Exercise: - Group verbal and visual presentation on the components of exercise prescription. Introduces F.I.T.T principle from ACSM for exercise prescriptions.  Reviews F.I.T.T. principles of aerobic exercise including progression. Written material given at graduation. Flowsheet Row Pulmonary Rehab from 10/12/2020 in [facility] Cardiac and Pulmonary Rehab  Date 09/21/20  Educator [facility]  Instruction Review Code 1- Verbalizes Understanding  [employer] training]      Education: Resistance Exercise: - Group  verbal and visual presentation on the components of exercise prescription. Introduces F.I.T.T principle from ACSM for exercise prescriptions  Reviews F.I.T.T. principles of resistance exercise including progression. Written material given at graduation.    Education: Exercise & Equipment Safety: - Individual verbal instruction and demonstration of equipment use and safety with use of the equipment. Flowsheet Row Pulmonary Rehab from 10/12/2020 in [facility] Cardiac and Pulmonary Rehab  Date 09/07/20  Educator [facility]  Instruction Review Code 1- Verbalizes Understanding      Education: Exercise Physiology & General Exercise Guidelines: - Group verbal and written instruction with models to review  the exercise physiology of the cardiovascular system and associated critical values. Provides general exercise guidelines with specific guidelines to those with heart or lung disease.  Flowsheet Row Pulmonary Rehab from 10/12/2020 in [facility] Cardiac and Pulmonary Rehab  Education need identified 09/07/20  Date 09/14/20  Educator [facility]  Instruction Review Code 1- [employer] Understanding      Education: Flexibility, Balance, Mind/Body Relaxation: - Group verbal and visual presentation with interactive activity on the components of exercise prescription. Introduces F.I.T.T principle from ACSM for exercise prescriptions. Reviews F.I.T.T. principles of flexibility and balance exercise training including progression. Also discusses the mind body connection.  Reviews various relaxation techniques to help reduce and manage stress (i.e. Deep breathing, progressive muscle relaxation, and visualization). Balance handout provided to take home. Written material given at graduation. Flowsheet Row Pulmonary Rehab from 10/12/2020 in [facility] Cardiac and Pulmonary Rehab  Date 08/03/20  Educator [facility]  Instruction Review Code 1- Verbalizes Understanding      Activity Barriers & Risk Stratification:  Activity Barriers & Cardiac Risk Stratification - 09/07/20 1417      Activity Barriers & Cardiac Risk Stratification   Activity Barriers Deconditioning;Shortness of Breath;Decreased Ventricular Function;Muscular Weakness;Arthritis    Cardiac Risk Stratification High           6 Minute Walk:  6 Minute Walk    Row Name 09/07/20 1506         6 Minute Walk   Phase Initial     Distance 740 feet     Walk Time 6 minutes     # of Rest Breaks 0     MPH 1.4     METS 1.77     RPE 12     Perceived Dyspnea  1     VO2 Peak 6.2     Symptoms Yes (comment)     Comments SOB     Resting HR 72 bpm     Resting BP 128/66     Resting Oxygen Saturation  98 %     Exercise Oxygen Saturation  during 6 min walk 88 %     Max Ex.  HR 98 bpm     Max Ex. BP 134/74     2 Minute Post BP 124/64           Interval HR   1 Minute HR 88     2 Minute HR 87     3 Minute HR 92     4 Minute HR 96     5 Minute HR 97     6 Minute HR 98     2 Minute Post HR 87     Interval Heart Rate? Yes           Interval Oxygen   Interval Oxygen? Yes     Baseline Oxygen Saturation % 98 %     1 Minute Oxygen Saturation % 97 %  1 Minute Liters of Oxygen 0 L  Room Air     2 Minute Oxygen Saturation % 94 %     2 Minute Liters of Oxygen 0 L     3 Minute Oxygen Saturation % 95 %     3 Minute Liters of Oxygen 0 L     4 Minute Oxygen Saturation % 88 %     4 Minute Liters of Oxygen 0 L     5 Minute Oxygen Saturation % 91 %     5 Minute Liters of Oxygen 0 L     6 Minute Oxygen Saturation % 93 %     6 Minute Liters of Oxygen 0 L     2 Minute Post Oxygen Saturation % 94 %     2 Minute Post Liters of Oxygen 0 L            Oxygen Initial Assessment:  Oxygen Initial Assessment - 09/07/20 1415      Home Oxygen   Home Oxygen Device None    Sleep Oxygen Prescription None    Home Exercise Oxygen Prescription None    Home Resting Oxygen Prescription None      Initial 6 min Walk   Oxygen Used None      Program Oxygen Prescription   Program Oxygen Prescription None      Intervention   Short Term Goals To learn and understand importance of monitoring SPO2 with pulse oximeter and demonstrate accurate use of the pulse oximeter.;To learn and understand importance of maintaining oxygen saturations>88%;To learn and demonstrate proper pursed lip breathing techniques or other breathing techniques.    Long  Term Goals Verbalizes importance of monitoring SPO2 with pulse oximeter and return demonstration;Maintenance of O2 saturations>88%;Exhibits proper breathing techniques, such as pursed lip breathing or other method taught during program session           Oxygen Re-Evaluation:  Oxygen Re-Evaluation    Row Name 09/08/20 1734 09/28/20  1209           Program Oxygen Prescription   Program Oxygen Prescription None None             Home Oxygen   Home Oxygen Device None None      Sleep Oxygen Prescription None None      Home Exercise Oxygen Prescription None None      Home Resting Oxygen Prescription None None             Goals/Expected Outcomes   Short Term Goals To learn and understand importance of monitoring SPO2 with pulse oximeter and demonstrate accurate use of the pulse oximeter.;To learn and understand importance of maintaining oxygen saturations>88%;To learn and demonstrate proper pursed lip breathing techniques or other breathing techniques. To learn and understand importance of monitoring SPO2 with pulse oximeter and demonstrate accurate use of the pulse oximeter.;To learn and understand importance of maintaining oxygen saturations>88%;To learn and demonstrate proper pursed lip breathing techniques or other breathing techniques.      Long  Term Goals Verbalizes importance of monitoring SPO2 with pulse oximeter and return demonstration;Maintenance of O2 saturations>88%;Exhibits proper breathing techniques, such as pursed lip breathing or other method taught during program session Verbalizes importance of monitoring SPO2 with pulse oximeter and return demonstration;Maintenance of O2 saturations>88%;Exhibits proper breathing techniques, such as pursed lip breathing or other method taught during program session      Comments Reviewed PLB technique with pt.  Talked about how it works and it's importance in maintaining their  exercise saturations. David Perez has been doing well with his breathing.  He is using his PLB and finds it helpful.  Saturations have been good in class.  He feels that exercise is helping to improve his breathing overall.      Goals/Expected Outcomes Short: Become more profiecient at using PLB.   Long: Become independent at using PLB. Short: continue to use PLB  Long; continue to improve breathing.              Oxygen Discharge (Final Oxygen Re-Evaluation):  Oxygen Re-Evaluation - 09/28/20 1209      Program Oxygen Prescription   Program Oxygen Prescription None      Home Oxygen   Home Oxygen Device None    Sleep Oxygen Prescription None    Home Exercise Oxygen Prescription None    Home Resting Oxygen Prescription None      Goals/Expected Outcomes   Short Term Goals To learn and understand importance of monitoring SPO2 with pulse oximeter and demonstrate accurate use of the pulse oximeter.;To learn and understand importance of maintaining oxygen saturations>88%;To learn and demonstrate proper pursed lip breathing techniques or other breathing techniques.    Long  Term Goals Verbalizes importance of monitoring SPO2 with pulse oximeter and return demonstration;Maintenance of O2 saturations>88%;Exhibits proper breathing techniques, such as pursed lip breathing or other method taught during program session    Comments David Perez has been doing well with his breathing.  He is using his PLB and finds it helpful.  Saturations have been good in class.  He feels that exercise is helping to improve his breathing overall.    Goals/Expected Outcomes Short: continue to use PLB  Long; continue to improve breathing.           Initial Exercise Prescription:  Initial Exercise Prescription - 09/07/20 1500      Date of Initial Exercise RX and Referring Provider   Date 09/07/20    Referring Provider Loralie Champagne MD      Treadmill   MPH 1    Grade 0.5    Minutes 15    METs 1.8      Recumbant Elliptical   Level 1    RPM 50    Minutes 15    METs 1.7      REL-XR   Level 3    Speed 50    Minutes 15    METs 1.7      Prescription Details   Frequency (times per week) 3    Duration Progress to 30 minutes of continuous aerobic without signs/symptoms of physical distress      Intensity   THRR 40-80% of Max Heartrate 99-126    Ratings of Perceived Exertion 11-13    Perceived Dyspnea 0-4       Progression   Progression Continue to progress workloads to maintain intensity without signs/symptoms of physical distress.      Resistance Training   Training Prescription Yes    Weight 3 lb    Reps 10-15           Perform Capillary Blood Glucose checks as needed.  Exercise Prescription Changes:  Exercise Prescription Changes    Row Name 09/07/20 1500 09/14/20 0900 09/28/20 1700 10/12/20 1500       Response to Exercise   Blood Pressure (Admit) 128/66 110/58 120/70 118/76    Blood Pressure (Exercise) 134/74 118/56 138/80 128/50    Blood Pressure (Exit) 124/64 92/60 102/68 106/56    Heart Rate (Admit) 72 bpm 90 bpm  74 bpm 76 bpm    Heart Rate (Exercise) 98 bpm 90 bpm 98 bpm 93 bpm    Heart Rate (Exit) 90 bpm 73 bpm 80 bpm 88 bpm    Oxygen Saturation (Admit) 98 % 97 % 96 % 98 %    Oxygen Saturation (Exercise) 88 % 97 % 96 % 97 %    Oxygen Saturation (Exit) 96 % 97 % 97 % 97 %    Rating of Perceived Exertion (Exercise) 12 12 14 15     Perceived Dyspnea (Exercise) 1 1 1 2     Symptoms SOB SOB -- SOB, fatigue    Comments walk test results -- -- --    Duration -- Continue with 30 min of aerobic exercise without signs/symptoms of physical distress. Continue with 30 min of aerobic exercise without signs/symptoms of physical distress. Continue with 30 min of aerobic exercise without signs/symptoms of physical distress.    Intensity -- THRR unchanged THRR unchanged THRR unchanged         Progression   Progression -- Continue to progress workloads to maintain intensity without signs/symptoms of physical distress. Continue to progress workloads to maintain intensity without signs/symptoms of physical distress. Continue to progress workloads to maintain intensity without signs/symptoms of physical distress.    Average METs -- 1.8 1.8 1.77         Resistance Training   Training Prescription -- Yes Yes Yes    Weight -- 3 lb 3 lb 3 lb    Reps -- 10-15 10-15 10-15         Interval  Training   Interval Training -- No No No         Treadmill   MPH -- 1 -- 1    Grade -- 0.5 -- 0    Minutes -- 15 -- 15    METs -- 1.8 -- 1.77         NuStep   Level -- -- 4 --    SPM -- -- 80 --    Minutes -- -- 15 --    METs -- -- 1.5 --         Recumbant Elliptical   Level -- 1 -- --    Minutes -- 15 -- --    METs -- 1.8 -- --         REL-XR   Level -- 1 -- 1    Minutes -- 15 -- 15         Biostep-RELP   Level -- -- 1 --    SPM -- -- 50 --    Minutes -- -- 15 --    METs -- -- 2 --           Exercise Comments:  Exercise Comments    Row Name 09/30/20 1030 10/07/20 1024         Exercise Comments David Perez did not complete his rehab session.  David Perez was upset that he was not allowed to do the elliptical today or a treadmill.  We explained that we have new people coming into the program and have to rotate equipment from time to time to allow new people to start and for those that have been here to advance.  He was saying that we have never let him try the elliptical in the back of gym at station 10.  When he was in cardiac, he attempted the elliptical at station 4 and was unable to keep the machine on and did not last.  We explained that the one in back is even harder and he still wanted to try.  He was upset that he was not allowed to go back there.  We tried to explain that someone was already on that piece of equipment and it could not be used until she left and it was cleaned as safety protocols for COVID-19 need people to stay 6 ft apart and equipment cleaned between patients.  He said we never explained or even gave him a chance to prove that he could or could not do it.  He resigned to wait until she left to try and to do his station.  Then he felt that he did not know what the station did or what the numbers meant on the machine.  These were explained as best as possible and he was willing to try to keep up the spm on the stepper.  He then said that he couldn't  maintain the steps per minutes and his legs weren't strong enough and not where he wants them to be after already completing the cardiac program.  He has not been doing much exercise at home other than resistance training.  He was upset and felt that it was just best to leave at that point.  His vital were good to discharge home. David Perez is refusing to increase on treadmill today.  He is walking at 0.5 mph only.             Exercise Goals and Review:  Exercise Goals    Row Name 09/07/20 1509             Exercise Goals   Increase Physical Activity Yes       Intervention Provide advice, education, support and counseling about physical activity/exercise needs.;Develop an individualized exercise prescription for aerobic and resistive training based on initial evaluation findings, risk stratification, comorbidities and participant's personal goals.       Expected Outcomes Short Term: Attend rehab on a regular basis to increase amount of physical activity.;Long Term: Add in home exercise to make exercise part of routine and to increase amount of physical activity.;Long Term: Exercising regularly at least 3-5 days a week.       Increase Strength and Stamina Yes       Intervention Provide advice, education, support and counseling about physical activity/exercise needs.;Develop an individualized exercise prescription for aerobic and resistive training based on initial evaluation findings, risk stratification, comorbidities and participant's personal goals.       Expected Outcomes Short Term: Increase workloads from initial exercise prescription for resistance, speed, and METs.;Short Term: Perform resistance training exercises routinely during rehab and add in resistance training at home;Long Term: Improve cardiorespiratory fitness, muscular endurance and strength as measured by increased METs and functional capacity (6MWT)       Able to understand and use rate of perceived exertion (RPE) scale Yes        Intervention Provide education and explanation on how to use RPE scale       Expected Outcomes Short Term: Able to use RPE daily in rehab to express subjective intensity level;Long Term:  Able to use RPE to guide intensity level when exercising independently       Able to understand and use Dyspnea scale Yes       Intervention Provide education and explanation on how to use Dyspnea scale       Expected Outcomes Long Term: Able to use Dyspnea scale to guide intensity level when exercising independently;Short Term:  Able to use Dyspnea scale daily in rehab to express subjective sense of shortness of breath during exertion       Knowledge and understanding of Target Heart Rate Range (THRR) Yes       Intervention Provide education and explanation of THRR including how the numbers were predicted and where they are located for reference       Expected Outcomes Short Term: Able to state/look up THRR;Short Term: Able to use daily as guideline for intensity in rehab;Long Term: Able to use THRR to govern intensity when exercising independently       Able to check pulse independently Yes       Intervention Provide education and demonstration on how to check pulse in carotid and radial arteries.;Review the importance of being able to check your own pulse for safety during independent exercise       Expected Outcomes Short Term: Able to explain why pulse checking is important during independent exercise;Long Term: Able to check pulse independently and accurately       Understanding of Exercise Prescription Yes       Intervention Provide education, explanation, and written materials on patient's individual exercise prescription       Expected Outcomes Short Term: Able to explain program exercise prescription;Long Term: Able to explain home exercise prescription to exercise independently              Exercise Goals Re-Evaluation :  Exercise Goals Re-Evaluation    Row Name 09/08/20 1733 09/14/20 0953 09/28/20  1157 10/12/20 1503 11/09/20 0957     Exercise Goal Re-Evaluation   Exercise Goals Review Increase Physical Activity;Able to understand and use rate of perceived exertion (RPE) scale;Knowledge and understanding of Target Heart Rate Range (THRR);Understanding of Exercise Prescription;Increase Strength and Stamina;Able to understand and use Dyspnea scale;Able to check pulse independently Increase Physical Activity;Increase Strength and Stamina;Understanding of Exercise Prescription Increase Physical Activity;Increase Strength and Stamina;Understanding of Exercise Prescription Increase Physical Activity;Increase Strength and Stamina;Understanding of Exercise Prescription --   Comments Reviewed RPE and dyspnea scales, THR and program prescription with pt today.  Pt voiced understanding and was given a copy of goals to take home. David Perez is off to a good start in Pulmonary Rehab.  He is already up to 1.8 METs on the XR.  We will continue to monitor his progress. David Perez is doing well in rehab.  He is not doing much cardio on his off days, but he is doing his weights.  We talked about the importance of cardio, but he feels he is getting that in class.  David Perez has expressed interest in using the ellipitcal, and we have told him that he needs to build up his speed on the XR and treadmill prior to trying the elliptical. David Perez continues to walk very slowly on the treadmill and is not interested in going any faster.  He did do the XR today, but is still not able to maintain speed of 50 rpm for the entire 15 min.  We will continue to encourage him and to monitor his progress. out since last review   Expected Outcomes Short: Use RPE daily to regulate intensity. Long: Follow program prescription in THR. Short: Continue to attend rehab regularly  Long: Conitnue to follow program prescription Short: Continue to try to add in cardio at home Long; conitnue to improve stamina. Short: Increase speed on all machine Long: Continue to work on leg  strength --          Discharge Exercise  Prescription (Final Exercise Prescription Changes):  Exercise Prescription Changes - 10/12/20 1500      Response to Exercise   Blood Pressure (Admit) 118/76    Blood Pressure (Exercise) 128/50    Blood Pressure (Exit) 106/56    Heart Rate (Admit) 76 bpm    Heart Rate (Exercise) 93 bpm    Heart Rate (Exit) 88 bpm    Oxygen Saturation (Admit) 98 %    Oxygen Saturation (Exercise) 97 %    Oxygen Saturation (Exit) 97 %    Rating of Perceived Exertion (Exercise) 15    Perceived Dyspnea (Exercise) 2    Symptoms SOB, fatigue    Duration Continue with 30 min of aerobic exercise without signs/symptoms of physical distress.    Intensity THRR unchanged      Progression   Progression Continue to progress workloads to maintain intensity without signs/symptoms of physical distress.    Average METs 1.77      Resistance Training   Training Prescription Yes    Weight 3 lb    Reps 10-15      Interval Training   Interval Training No      Treadmill   MPH 1    Grade 0    Minutes 15    METs 1.77      REL-XR   Level 1    Minutes 15           Nutrition:  Target Goals: Understanding of nutrition guidelines, daily intake of sodium 1500mg , cholesterol 200mg , calories 30% from fat and 7% or less from saturated fats, daily to have 5 or more servings of fruits and vegetables.  Education: All About Nutrition: -Group instruction provided by verbal, written material, interactive activities, discussions, models, and posters to present general guidelines for heart healthy nutrition including fat, fiber, MyPlate, the role of sodium in heart healthy nutrition, utilization of the nutrition label, and utilization of this knowledge for meal planning. Follow up email sent as well. Written material given at graduation. Flowsheet Row Pulmonary Rehab from 10/12/2020 in [facility] Cardiac and Pulmonary Rehab  Education need identified 09/07/20  Date 10/12/20  Educator  [place]  Instruction Review Code 5- Refused Teaching      Biometrics:  Pre Biometrics - 09/07/20 1539      Pre Biometrics   Height 5' 5.5" (1.664 m)    Weight 115 lb 12.8 oz (52.5 kg)    BMI (Calculated) 18.97    Single Leg Stand 1.12 seconds            Nutrition Therapy Plan and Nutrition Goals:  Nutrition Therapy & Goals - 09/26/20 0947      Nutrition Therapy   Diet Heart healthy, low sodium, pulmonary MNT    Protein (specify units) 75g    Fiber 30 grams    Whole Grain Foods 3 servings    Saturated Fats 12 max. grams    Fruits and Vegetables 5 servings/day    Sodium 1.5 grams      Personal Nutrition Goals   Nutrition Goal ST: trying out frozen vegetables that are easier to chew in addition to his meals he gets out. LT: maintain weight, include at least 5 fruits or vegetables a day, reduce sodium to 2g/day.    Comments B: soft boiled eggs (2) with whole wheat toast or special K protein cereal with skim milk - will have coffee (sweet and low) or juice. Puts protein powder in skim milk or juice. He doesn't drink whole milk because it makes  him congested. He eats soft foods because he has a hard time chewing. S: fruit (blueberries or banana) L: chicken wings and macaroni salad. D:hamburger from [employer], [place] [employer] vegetable egg rolls, sometimes fish. doesn't cook at home much or prepare things at home.  He doesn't cook and his wife has passed. Reviewed heart healthy eating and easy low prep meals such as rotisserie chicken (with vegetables and grains/potato, with pasta, in soup with low sodium broth and frozen veggies, chicken salad), pasta or bean salad using canned or pre-cut vegetables, or pasta with frozen broccoli, jarred sauce, and beans or chicken. Discussed pulmonary MNT as well as making sure meeting calorie and protein needs.  He reports not wanting to get "rolls of fat".      Intervention Plan   Intervention Prescribe, educate and counsel regarding individualized specific  dietary modifications aiming towards targeted core components such as weight, hypertension, lipid management, diabetes, heart failure and other comorbidities.;Nutrition handout(s) given to patient.    Expected Outcomes Short Term Goal: Understand basic principles of dietary content, such as calories, fat, sodium, cholesterol and nutrients.;Short Term Goal: A plan has been developed with personal nutrition goals set during dietitian appointment.;Long Term Goal: Adherence to prescribed nutrition plan.           Nutrition Assessments:  Nutrition Assessments - 09/07/20 1544      MEDFICTS Scores   Pre Score 47          MEDIFICTS Score Key:  ?70 Need to make dietary changes   40-70 Heart Healthy Diet  ? 40 Therapeutic Level Cholesterol Diet   Picture Your Plate Scores:  <16 Unhealthy dietary pattern with much room for improvement.  41-50 Dietary pattern unlikely to meet recommendations for good health and room for improvement.  51-60 More healthful dietary pattern, with some room for improvement.   >60 Healthy dietary pattern, although there may be some specific behaviors that could be improved.    Nutrition Goals Re-Evaluation:   Nutrition Goals Discharge (Final Nutrition Goals Re-Evaluation):   Psychosocial: Target Goals: Acknowledge presence or absence of significant depression and/or stress, maximize coping skills, provide positive support system. Participant is able to verbalize types and ability to use techniques and skills needed for reducing stress and depression.   Education: Stress, Anxiety, and Depression - Group verbal and visual presentation to define topics covered.  Reviews how body is impacted by stress, anxiety, and depression.  Also discusses healthy ways to reduce stress and to treat/manage anxiety and depression.  Written material given at graduation. Flowsheet Row Pulmonary Rehab from 10/12/2020 in [facility] Cardiac and Pulmonary Rehab  Date 07/06/20   Educator SB  Instruction Review Code 1- [employer] Understanding      Education: Sleep Hygiene -Provides group verbal and written instruction about how sleep can affect your health.  Define sleep hygiene, discuss sleep cycles and impact of sleep habits. Review good sleep hygiene tips.    Initial Review & Psychosocial Screening:  Initial Psych Review & Screening - 09/07/20 1415      Initial Review   Current issues with None Identified    Source of Stress Concerns None Identified      Family Dynamics   Good Support System? Yes   daughters, [name] lives the closest     Barriers   Psychosocial barriers to participate in program The patient should benefit from training in stress management and relaxation.;There are no identifiable barriers or psychosocial needs.      Screening Interventions   Interventions  Provide feedback about the scores to participant;Encouraged to exercise;To provide support and resources with identified psychosocial needs    Expected Outcomes Short Term goal: Utilizing psychosocial counselor, staff and physician to assist with identification of specific Stressors or current issues interfering with healing process. Setting desired goal for each stressor or current issue identified.;Long Term Goal: Stressors or current issues are controlled or eliminated.;Short Term goal: Identification and review with participant of any Quality of Life or Depression concerns found by scoring the questionnaire.;Long Term goal: The participant improves quality of Life and PHQ9 Scores as seen by post scores and/or verbalization of changes           Quality of Life Scores:   Scores of 19 and below usually indicate a poorer quality of life in these areas.  A difference of  2-3 points is a clinically meaningful difference.  A difference of 2-3 points in the total score of the Quality of Life Index has been associated with significant improvement in overall quality of life, self-image,  physical symptoms, and general health in studies assessing change in quality of life.  PHQ-9: Recent Review Flowsheet Data    Depression screen [facility] 2/9 09/07/2020 08/15/2020 06/29/2020 06/20/2020 05/10/2020   Decreased Interest 0 0 0 0 0   Down, Depressed, Hopeless 0 0 0 0 0   PHQ - 2 Score 0 0 0 0 0   Altered sleeping 0 - 0 0 0   Tired, decreased energy 0 - 0 0 0   Change in appetite 0 - 0 0 0   Feeling bad or failure about yourself  0 - 0 0 0   Trouble concentrating 0 - 0 0 0   Moving slowly or fidgety/restless 0 - 0 0 0   Suicidal thoughts 0 - 0 0 0   PHQ-9 Score 0 - 0 0 0   Difficult doing work/chores Not difficult at all - - - Not difficult at all     Interpretation of Total Score  Total Score Depression Severity:  1-4 = Minimal depression, 5-9 = Mild depression, 10-14 = Moderate depression, 15-19 = Moderately severe depression, 20-27 = Severe depression   Psychosocial Evaluation and Intervention:  Psychosocial Evaluation - 09/07/20 1539      Psychosocial Evaluation & Interventions   Interventions Encouraged to exercise with the program and follow exercise prescription    Comments David Perez is now returning to Pulmonary Rehab after finishing cardiac rehab.  He is coming in with heart failure and SOB.  He noticed how much cardiac rehab helped him and wanted to continue to exercise.  He has had a recent medication change and wants to combine it with his rehab to continue to feel better.  He feels that he is back on track to get back to where he wants to be.  He has also started to take the stairs after rehab to help rebuild his stamina.    Expected Outcomes Short: Attend pulmonary rehab and education classes  Long: Continue to focus on positives.           Psychosocial Re-Evaluation:  Psychosocial Re-Evaluation    [place] Name 09/28/20 1159             Psychosocial Re-Evaluation   Current issues with None Identified       Comments David Perez is doing well in rehab.  He is sleeping the best he  has in years!  He denies any major stressors.       Expected Outcomes Short:  Continue to exercise and sleep well Long: Continue to stay positive       Interventions Encouraged to attend Pulmonary Rehabilitation for the exercise       Continue Psychosocial Services  Follow up required by staff              Psychosocial Discharge (Final Psychosocial Re-Evaluation):  Psychosocial Re-Evaluation - 09/28/20 1159      Psychosocial Re-Evaluation   Current issues with None Identified    Comments David Perez is doing well in rehab.  He is sleeping the best he has in years!  He denies any major stressors.    Expected Outcomes Short: Continue to exercise and sleep well Long: Continue to stay positive    Interventions Encouraged to attend Pulmonary Rehabilitation for the exercise    Continue Psychosocial Services  Follow up required by staff           Vocational Rehabilitation: Provide vocational rehab assistance to qualifying candidates.   Vocational Rehab Evaluation & Intervention:   Education: Education Goals: Education classes will be provided on a variety of topics geared toward better understanding of heart health and risk factor modification. Participant will state understanding/return demonstration of topics presented as noted by education test scores.  Learning Barriers/Preferences:  Learning Barriers/Preferences - 09/07/20 1416      Learning Barriers/Preferences   Learning Barriers None    Learning Preferences None           General Cardiac Education Topics:  AED/CPR: - Group verbal and written instruction with the use of models to demonstrate the basic use of the AED with the basic ABC's of resuscitation.   Anatomy and Cardiac Procedures: - Group verbal and visual presentation and models provide information about basic cardiac anatomy and function. Reviews the testing methods done to diagnose heart disease and the outcomes of the test results. Describes the treatment choices:  Medical Management, Angioplasty, or Coronary Bypass Surgery for treating various heart conditions including Myocardial Infarction, Angina, Valve Disease, and Cardiac Arrhythmias.  Written material given at graduation.   Medication Safety: - Group verbal and visual instruction to review commonly prescribed medications for heart and lung disease. Reviews the medication, class of the drug, and side effects. Includes the steps to properly store meds and maintain the prescription regimen.  Written material given at graduation. Flowsheet Row Pulmonary Rehab from 10/12/2020 in [facility] Cardiac and Pulmonary Rehab  Date 08/17/20  Educator SB  Instruction Review Code 1- Verbalizes Understanding      Intimacy: - Group verbal instruction through game format to discuss how heart and lung disease can affect sexual intimacy. Written material given at graduation.[place] Row Pulmonary Rehab from 10/12/2020 in [facility] Cardiac and Pulmonary Rehab  Date 09/21/20  Educator [facility]  Instruction Review Code 1- Verbalizes Understanding      Know Your Numbers and Heart Failure: - Group verbal and visual instruction to discuss disease risk factors for cardiac and pulmonary disease and treatment options.  Reviews associated critical values for Overweight/Obesity, Hypertension, Cholesterol, and Diabetes.  Discusses basics of heart failure: signs/symptoms and treatments.  Introduces Heart Failure Zone chart for action plan for heart failure.  Written material given at graduation.   Infection Prevention: - Provides verbal and written material to individual with discussion of infection control including proper hand washing and proper equipment cleaning during exercise session. Flowsheet Row Pulmonary Rehab from 10/12/2020 in [facility] Cardiac and Pulmonary Rehab  Date 09/07/20  Educator [facility]  Instruction Review Code 1- Verbalizes Understanding  Falls Prevention: - Provides verbal and written material to individual with  discussion of falls prevention and safety. Flowsheet Row Pulmonary Rehab from 10/12/2020 in [facility] Cardiac and Pulmonary Rehab  Date 09/07/20  Educator [facility]  Instruction Review Code 1- Verbalizes Understanding      Other: -Provides group and verbal instruction on various topics (see comments)   Knowledge Questionnaire Score:  Knowledge Questionnaire Score - 09/07/20 1548      Knowledge Questionnaire Score   Pre Score 12/18 Education Focus: Lung Disease, O2 safety, Nutrition           Core Components/Risk Factors/Patient Goals at Admission:  Personal Goals and Risk Factors at Admission - 09/07/20 1549      Core Components/Risk Factors/Patient Goals on Admission    Weight Management Yes;Weight Maintenance    Intervention Weight Management: Develop a combined nutrition and exercise program designed to reach desired caloric intake, while maintaining appropriate intake of nutrient and fiber, sodium and fats, and appropriate energy expenditure required for the weight goal.;Weight Management: Provide education and appropriate resources to help participant work on and attain dietary goals.    Admit Weight 115 lb 12.8 oz (52.5 kg)    Goal Weight: Short Term 115 lb (52.2 kg)    Goal Weight: Long Term 115 lb (52.2 kg)    Expected Outcomes Short Term: Continue to assess and modify interventions until short term weight is achieved;Long Term: Adherence to nutrition and physical activity/exercise program aimed toward attainment of established weight goal;Weight Maintenance: Understanding of the daily nutrition guidelines, which includes 25-35% calories from fat, 7% or less cal from saturated fats, less than 200mg  cholesterol, less than 1.5gm of sodium, & 5 or more servings of fruits and vegetables daily    Improve shortness of breath with ADL's Yes    Intervention Provide education, individualized exercise plan and daily activity instruction to help decrease symptoms of SOB with activities of daily  living.    Expected Outcomes Short Term: Improve cardiorespiratory fitness to achieve a reduction of symptoms when performing ADLs;Long Term: Be able to perform more ADLs without symptoms or delay the onset of symptoms    Heart Failure Yes    Intervention Provide a combined exercise and nutrition program that is supplemented with education, support and counseling about heart failure. Directed toward relieving symptoms such as shortness of breath, decreased exercise tolerance, and extremity edema.    Expected Outcomes Improve functional capacity of life;Short term: Daily weights obtained and reported for increase. Utilizing diuretic protocols set by physician.;Short term: Attendance in program 2-3 days a week with increased exercise capacity. Reported lower sodium intake. Reported increased fruit and vegetable intake. Reports medication compliance.;Long term: Adoption of self-care skills and reduction of barriers for early signs and symptoms recognition and intervention leading to self-care maintenance.    Hypertension Yes    Intervention Monitor prescription use compliance.;Provide education on lifestyle modifcations including regular physical activity/exercise, weight management, moderate sodium restriction and increased consumption of fresh fruit, vegetables, and low fat dairy, alcohol moderation, and smoking cessation.    Expected Outcomes Short Term: Continued assessment and intervention until BP is < 140/48mm HG in hypertensive participants. < 130/72mm HG in hypertensive participants with diabetes, heart failure or chronic kidney disease.;Long Term: Maintenance of blood pressure at goal levels.    Lipids Yes    Intervention Provide education and support for participant on nutrition & aerobic/resistive exercise along with prescribed medications to achieve LDL 70mg , HDL >40mg .    Expected Outcomes Short Term: Participant  states understanding of desired cholesterol values and is compliant with  medications prescribed. Participant is following exercise prescription and nutrition guidelines.;Long Term: Cholesterol controlled with medications as prescribed, with individualized exercise RX and with personalized nutrition plan. Value goals: LDL < 70mg , HDL > 40 mg.           Education:Diabetes - Individual verbal and written instruction to review signs/symptoms of diabetes, desired ranges of glucose level fasting, after meals and with exercise. Acknowledge that pre and post exercise glucose checks will be done for 3 sessions at entry of program.   Core Components/Risk Factors/Patient Goals Review:   Goals and Risk Factor Review    Row Name 09/28/20 1206             Core Components/Risk Factors/Patient Goals Review   Personal Goals Review Weight Management/Obesity;Hypertension;Heart Failure;Lipids       Review David Perez is doing well in rehab.  His weight is holding pretty steady.  He denies any heart failure symptoms.  His pressures have been doing fairly well in class, but he is not checking them at home.  We talked about the importance of keeping an eye on it at home.       Expected Outcomes Short: Start checking blood pressures at home  Long: Continue to monitor risk factors.              Core Components/Risk Factors/Patient Goals at Discharge (Final Review):   Goals and Risk Factor Review - 09/28/20 1206      Core Components/Risk Factors/Patient Goals Review   Personal Goals Review Weight Management/Obesity;Hypertension;Heart Failure;Lipids    Review David Perez is doing well in rehab.  His weight is holding pretty steady.  He denies any heart failure symptoms.  His pressures have been doing fairly well in class, but he is not checking them at home.  We talked about the importance of keeping an eye on it at home.    Expected Outcomes Short: Start checking blood pressures at home  Long: Continue to monitor risk factors.           ITP Comments:  ITP Comments    Row Name 09/07/20 1503  09/08/20 1731 09/30/20 1030 10/05/20 0647 10/05/20 0648   ITP Comments Completed 6MWT and gym orientation. Initial ITP created and sent for review to Dr. Emily Filbert, Medical Director. First full day of exercise!  Patient was oriented to gym and equipment including functions, settings, policies, and procedures.  Patient's individual exercise prescription and treatment plan were reviewed.  All starting workloads were established based on the results of the 6 minute walk test done at initial orientation visit.  The plan for exercise progression was also introduced and progression will be customized based on patient's performance and goals. David Perez did not complete his rehab session.  David Perez was upset that he was not allowed to do the elliptical today or a treadmill.  We explained that we have new people coming into the program and have to rotate equipment from time to time to allow new people to start and for those that have been here to advance.  He was saying that we have never let him try the elliptical in the back of gym at station 10.  When he was in cardiac, he attempted the elliptical at station 4 and was unable to keep the machine on and did not last.  We explained that the one in back is even harder and he still wanted to try.  He was upset that  he was not allowed to go back there.  We tried to explain that someone was already on that piece of equipment and it could not be used until she left and it was cleaned as safety protocols for COVID-19 need people to stay 6 ft apart and equipment cleaned between patients.  He said we never explained or even gave him a chance to prove that he could or could not do it.  He resigned to wait until she left to try and to do his station.  Then he felt that he did not know what the station did or what the numbers meant on the machine.  These were explained as best as possible and he was willing to try to keep up the spm on the stepper.  He then said that he couldn't  maintain the steps per minutes and his legs weren't strong enough and not where he wants them to be after already completing the cardiac program.  He has not been doing much exercise at home other than resistance training.  He was upset and felt that it was just best to leave at that point.  His vital were good to discharge home. David Perez did return to the program and was able to exercise without any voiced concerns 30 Day review completed. Medical Director ITP review done, changes made as directed, and signed approval by Medical Director.   [place] Name 11/02/20 [phone] 11/09/20 0956         ITP Comments 30 Day review completed. Medical Director ITP review done, changes made as directed, and signed approval by Medical Director. 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

## 2020-12-03 NOTE — Death Summary Note (Signed)
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[place]) Secondary Diagnoses (including complications and co-morbidities):  Principal Problem:   Hypotension due to hypovolemia Active Problems:   S/P CABG x 3   Debility   Acute on chronic systolic CHF (congestive heart failure) (HCC)   Macrocytic anemia   AKI (acute kidney injury) ([place])   Terminal care   Chronic anticoagulation   Weight loss   Protein malnutrition (HCC)   Hypotension   Failure to thrive in adult   Acute embolic stroke ([place])   Moderate pulmonary hypertension ([place])   Anemia of chronic disease   Severe protein-calorie malnutrition [employer]: less than 60% of standard weight) (HCC)   AF (paroxysmal atrial fibrillation) ([place])   Acute hypoxemic respiratory failure (HCC)   Aspiration pneumonia of both lower lobes due to gastric secretions ([place])   Palliative care by specialist   Increased oropharyngeal secretions   Brief Hospital Course (including significant findings, care, treatment, and services provided and events leading to death)  David Perez is a 80 y.o. year old male who presented encephalopathic and hypotensive, likely multifactorial but ultimately a consequence of progressive myelodysplastic disease. Acute stroke and systolic heart failure and hypoxic respiratory failure soon followed. Possible aspiration event. Palliative care consulted and patient transitioned to comfort care on 12/7.    Pertinent Labs and Studies  Significant Diagnostic Studies CT HEAD WO CONTRAST  Result Date: 11/05/2020 CLINICAL DATA:   Altered level of consciousness, hypotension EXAM: CT HEAD WITHOUT CONTRAST TECHNIQUE: Contiguous axial images were obtained from the base of the skull through the vertex without intravenous contrast. COMPARISON:  None. FINDINGS: Brain: Hypodensities are seen throughout the periventricular white matter and bilateral basal ganglia, most compatible with chronic small vessel ischemic change. No other signs of acute infarct or hemorrhage. The lateral ventricles and remaining midline structures are unremarkable. Age-appropriate cerebral atrophy. No acute extra-axial fluid collections. No mass effect. Vascular: No hyperdense vessel or unexpected calcification. Skull: Normal. Negative for fracture or focal lesion. Sinuses/Orbits: No acute finding. Other: None. IMPRESSION: 1. Chronic appearing small vessel ischemic changes throughout the periventricular white matter and basal ganglia. 2. No acute intracranial process. Electronically Signed   By: Randa Ngo M.D.   On: 11/05/2020 15:21   CT ANGIO CHEST PE W OR WO CONTRAST  Result Date: 11/06/2020 CLINICAL DATA:  PE suspected, positive D-dimer EXAM: CT ANGIOGRAPHY CHEST WITH CONTRAST TECHNIQUE: Multidetector CT imaging of the chest was performed using the standard protocol during bolus administration of intravenous contrast. Multiplanar CT image reconstructions and MIPs were obtained to evaluate the vascular anatomy. CONTRAST:  58mL OMNIPAQUE IOHEXOL 350 MG/ML SOLN COMPARISON:  Radiograph November 24, 2020, CT 12/30/2018 FINDINGS: Cardiovascular: Satisfactory opacification the pulmonary arteries to the segmental level. No pulmonary artery filling defects are identified. Central pulmonary arteries are normal caliber. Borderline cardiomegaly. Calcification of the native coronaries. 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 No pericardial effusion. Atherosclerotic plaque within the normal caliber  aorta. No visible acute luminal abnormality of the imaged aorta. No periaortic stranding or hemorrhage. Reflux of contrast into the hepatic veins and IVC.  Mediastinum/Nodes: Scattered mediastinal and hilar nodes are present, several increasing slightly in prominence from comparison studies, largest include a left paratracheal node measuring 11 mm (5/35) and an 11 mm subcarinal node (5/46). Worrisome axillary adenopathy. No acute abnormality of the thoracic esophagus. Coronal narrowing of the trachea may reflect some chronic obstructive pulmonary lung disease. No other acute abnormality is seen. Lungs/Pleura: Diffuse centrilobular emphysema. Interlobular septal thickening is present as well as some developing hazy ground-glass and more consolidative opacity in mid to lower lung predominance. Vascular redistribution is seen. A more focal region of opacity with some adjacent pleural tenting/architectural distortion is seen in the lateral aspect of the left upper lobe/superior lingula (5/47) small bilateral effusions with adjacent passive atelectasis. A similar area is also present in the lateral segment left lower lobe as well (7/74). No pneumothorax. Upper Abdomen: Atherosclerotic calcifications in the upper abdomen. Reflux of contrast into the hepatic veins and IVC, as above. No acute abnormalities present in the visualized portions of the upper abdomen. Musculoskeletal: The osseous structures appear diffusely demineralized which may limit detection of small or nondisplaced fractures. Exaggerated thoracic kyphosis is similar to prior with some chronic likely degenerative vertebral body height loss. Increased diameter of chest is also unchanged. Prior sternotomy without acute complication. Paucity of subcutaneous fat. Mild diffuse body wall edema. No focal chest wall lesions. Review of the MIP images confirms the above findings. IMPRESSION: 1. No evidence of acute pulmonary artery embolism. 2. Extensive severe  centrilobular emphysema. 3. Interspersed areas of ground-glass opacity, septal thickening and vascular redistribution suggest developing CHF/volume overload with pulmonary edema. 4. More focal opacities in the periphery of the left upper lobe/lingula and left lower lobe, could reflect regions of scarring and architectural distortion with atelectatic collapse and edema though underlying airspace disease/infection not excluded. Furthermore, should pursue follow-up imaging post therapeutic intervention to ensure resolution. 5. Scattered mediastinal and hilar nodes, several increasing slightly in prominence from comparison studies, possibly reactive. 6. 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 7. Aortic Atherosclerosis (ICD10-I70.0). Electronically Signed   By: Lovena Le M.D.   On: 11/06/2020 22:38   MR BRAIN WO CONTRAST  Result Date: 11/05/2020 CLINICAL DATA:  Neuro deficit, acute, stroke suspected. Additional provided: Generalized weakness with occasional confusion. Slurred speech. EXAM: MRI HEAD WITHOUT CONTRAST TECHNIQUE: Multiplanar, multiecho pulse sequences of the brain and surrounding structures were obtained without intravenous contrast. COMPARISON:  Head CT 11/05/2020. FINDINGS: Brain: The patient was unable to tolerate the full examination. As a result, only axial and coronal diffusion-weighted sequences, a sagittal T1 weighted sequence and an axial T2 weighted sequence could be obtained. The axial diffusion-weighted sequence is moderately motion degraded. The sagittal T1 weighted sequence is severely motion degraded. The axial T2 weighted sequence is moderate to severely motion degraded. Mild generalized cerebral atrophy. 3 mm focus of restricted diffusion compatible with acute infarction within the posterior right frontal lobe (motor strip) (series 2, image 85) (series 4, image 58). A punctate acute infarct is also questioned within the left cerebellar  hemisphere (series 4, image 65). Advanced multifocal T2/FLAIR hyperintensity within the cerebral white matter which is nonspecific, but compatible chronic small vessel ischemic disease. Within the limitations of motion degradation, there is no appreciable intracranial mass or extra-axial fluid collection. No midline shift Vascular: Expected proximal arterial flow voids. Skull and upper cervical spine: No focal marrow lesion is identified. Sinuses/Orbits: Visualized orbits show no acute finding. Mild ethmoid sinus mucosal thickening. IMPRESSION: Prematurely terminated and significantly motion  degraded examination, as described and limiting evaluation. 3 mm acute infarction within the posterior right frontal lobe (motor strip). Possible additional punctate acute infarct within the left cerebellar hemisphere. Mild cerebral atrophy and advanced chronic small vessel ischemic disease. Electronically Signed   By: Kellie Simmering DO   On: 11/05/2020 19:43   CT ABDOMEN PELVIS W CONTRAST  Result Date: 11/30/2020 CLINICAL DATA:  [place] 2 weeks ago, right lower back pain, hypotension EXAM: CT ABDOMEN AND PELVIS WITH CONTRAST TECHNIQUE: Multidetector CT imaging of the abdomen and pelvis was performed using the standard protocol following bolus administration of intravenous contrast. CONTRAST:  13mL OMNIPAQUE IOHEXOL 300 MG/ML  SOLN COMPARISON:  01/14/2020 FINDINGS: Lower chest: Emphysema, with bibasilar areas of scarring and fibrosis. No acute pleural or parenchymal lung disease. Hepatobiliary: No focal liver abnormality is seen. No gallstones, gallbladder wall thickening, or biliary dilatation. Pancreas: Unremarkable. No pancreatic ductal dilatation or surrounding inflammatory changes. Spleen: Normal in size without focal abnormality. Adrenals/Urinary Tract: Adrenal glands are unremarkable. Kidneys are normal, without renal calculi, focal lesion, or hydronephrosis. Bladder is unremarkable. Stomach/Bowel: No bowel obstruction or  ileus. Portions of the descending and sigmoid colon are contained within a left inguinal hernia, with no evidence of obstruction, incarceration, or bowel ischemia. Vascular/Lymphatic: Aortic atherosclerosis. No enlarged abdominal or pelvic lymph nodes. Reproductive: Prostate is unremarkable. Other: There is a small amount of free fluid within the lower pelvis, as well as within the left inguinal hernia. No free intraperitoneal gas. Musculoskeletal: No acute or destructive bony lesions. Reconstructed images demonstrate no additional findings. IMPRESSION: 1. Left inguinal hernia containing a portion of the distal descending and sigmoid colon. No bowel obstruction, incarceration, or strangulation. 2. Trace free fluid within the lower pelvis and left inguinal hernia sac. 3. Aortic Atherosclerosis (ICD10-I70.0) and Emphysema (ICD10-J43.9). Electronically Signed   By: Randa Ngo M.D.   On: 11/06/2020 21:59   DG Chest Portable 1 View  Result Date: 11/16/2020 CLINICAL DATA:  [place] 1 week ago, hypotension EXAM: PORTABLE CHEST 1 VIEW COMPARISON:  02/29/2020 FINDINGS: Single frontal view of the chest demonstrates postsurgical changes from CABG and aortic valve replacement. Cardiac silhouette is unremarkable. No airspace disease, effusion, or pneumothorax. No acute bony abnormalities. IMPRESSION: 1. No acute intrathoracic process. Electronically Signed   By: Randa Ngo M.D.   On: 11/08/2020 17:50   ECHOCARDIOGRAM COMPLETE  Result Date: 11/07/2020    ECHOCARDIOGRAM REPORT   Patient Name:   KAHARI CRITZER Date of Exam: 11/07/2020 Medical Rec #:  [id number]          Height:       67.0 in Accession #:    [id number]         Weight:       146.0 lb Date of Birth:  12-18-1940          BSA:          1.769 m Patient Age:    65 years           BP:           95/61 mmHg Patient Gender: M                  HR:           97 bpm. Exam Location:  [facility] Procedure: 2D Echo, Color Doppler, Cardiac Doppler and Strain Analysis  Indications:     I50.9 Congestive Heart Failure  History:         Patient has prior history of Echocardiogram examinations,  most                  recent 06/10/2020. CAD; Prior CABG. Hepatitis B.  Sonographer:     Charmayne Sheer RDCS (AE) Referring Phys:  [id number] BRENDA MORRISON Diagnosing Phys: Kathlyn Sacramento MD  Sonographer Comments: Suboptimal parasternal window. Global longitudinal strain was attempted. IMPRESSIONS  1. Left ventricular ejection fraction, by estimation, is 25 to 30%. The left ventricle has severely decreased function. The left ventricle demonstrates regional wall motion abnormalities (see scoring diagram/findings for description). The left ventricular internal cavity size was moderately dilated. Left ventricular diastolic parameters are consistent with Grade II diastolic dysfunction (pseudonormalization). There is akinesis of the left ventricular, entire anteroseptal wall, anterior wall and apical segment. The average left ventricular global longitudinal strain is -5.5 %. The global longitudinal strain is abnormal.  2. Right ventricular systolic function is normal. The right ventricular size is normal. There is severely elevated pulmonary artery systolic pressure. The estimated right ventricular systolic pressure is 94.7 mmHg.  3. Left atrial size was mildly dilated.  4. The mitral valve is abnormal. Severe mitral valve regurgitation. No evidence of mitral stenosis. Moderate mitral annular calcification.  5. Tricuspid valve regurgitation is severe.  6. The aortic valve has been repaired/replaced. Aortic valve regurgitation is not visualized. No aortic stenosis is present. Aortic valve mean gradient measures 8.0 mmHg.  7. The inferior vena cava is normal in size with <50% respiratory variability, suggesting right atrial pressure of 8 mmHg. FINDINGS  Left Ventricle: Left ventricular ejection fraction, by estimation, is 25 to 30%. The left ventricle has severely decreased function. The left ventricle  demonstrates regional wall motion abnormalities. The average left ventricular global longitudinal strain is -5.5 %. The global longitudinal strain is abnormal. The left ventricular internal cavity size was moderately dilated. There is no left ventricular hypertrophy. Left ventricular diastolic parameters are consistent with Grade II diastolic dysfunction (pseudonormalization). Right Ventricle: The right ventricular size is normal. No increase in right ventricular wall thickness. Right ventricular systolic function is normal. There is severely elevated pulmonary artery systolic pressure. The tricuspid regurgitant velocity is 3.62 m/s, and with an assumed right atrial pressure of 8 mmHg, the estimated right ventricular systolic pressure is 09.6 mmHg. Left Atrium: Left atrial size was mildly dilated. Right Atrium: Right atrial size was normal in size. Pericardium: There is no evidence of pericardial effusion. Mitral Valve: The mitral valve is abnormal. Moderate mitral annular calcification. Severe mitral valve regurgitation. No evidence of mitral valve stenosis. MV peak gradient, 9.7 mmHg. The mean mitral valve gradient is 5.0 mmHg. Tricuspid Valve: The tricuspid valve is normal in structure. Tricuspid valve regurgitation is severe. No evidence of tricuspid stenosis. Aortic Valve: The aortic valve has been repaired/replaced. Aortic valve regurgitation is not visualized. No aortic stenosis is present. Aortic valve mean gradient measures 8.0 mmHg. Aortic valve peak gradient measures 14.1 mmHg. Aortic valve area, by VTI  measures 2.50 cm. 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 Pulmonic Valve: The pulmonic valve was normal in structure. Pulmonic valve regurgitation is not visualized. No evidence of pulmonic stenosis. Aorta: The aortic root is normal in size and structure. Venous: The inferior vena cava is normal in size with less than 50% respiratory variability, suggesting right atrial pressure of  8 mmHg. IAS/Shunts: No atrial level shunt detected by color flow Doppler.  LEFT VENTRICLE PLAX 2D LVIDd:         5.16 cm  Diastology LVIDs:  4.01 cm  LV e' medial:    3.70 cm/s LV PW:         0.92 cm  LV E/e' medial:  34.6 LV IVS:        0.76 cm  LV e' lateral:   4.13 cm/s LVOT diam:     2.20 cm  LV E/e' lateral: 31.0 LV SV:         78 LV SV Index:   44       2D Longitudinal Strain LVOT Area:     3.80 cm 2D Strain GLS Avg:     -5.5 %                          3D Volume EF:                         3D EF:        40 %                         LV EDV:       147 ml                         LV ESV:       88 ml                         LV SV:        59 ml RIGHT VENTRICLE RV Basal diam:  3.44 cm LEFT ATRIUM           Index       RIGHT ATRIUM           Index LA diam:      2.90 cm 1.64 cm/m  RA Area:     16.00 cm LA Vol (A2C): 31.8 ml 17.98 ml/m RA Volume:   41.50 ml  23.46 ml/m LA Vol (A4C): 67.7 ml 38.27 ml/m  AORTIC VALVE                    PULMONIC VALVE AV Area (Vmax):    2.41 cm     PV Vmax:       0.88 m/s AV Area (Vmean):   2.32 cm     PV Vmean:      62.600 cm/s AV Area (VTI):     2.50 cm     PV VTI:        0.118 m AV Vmax:           188.00 cm/s  PV Peak grad:  3.1 mmHg AV Vmean:          130.000 cm/s PV Mean grad:  2.0 mmHg AV VTI:            0.312 m AV Peak Grad:      14.1 mmHg AV Mean Grad:      8.0 mmHg LVOT Vmax:         119.00 cm/s LVOT Vmean:        79.500 cm/s LVOT VTI:          0.205 m LVOT/AV VTI ratio: 0.66  AORTA Ao Root diam: 2.80 cm MITRAL VALVE                TRICUSPID VALVE MV Area (PHT): 8.16 cm     TR Peak grad:   52.4  mmHg MV Peak grad:  9.7 mmHg     TR Vmax:        362.00 cm/s MV Mean grad:  5.0 mmHg MV Vmax:       1.56 m/s     SHUNTS MV Vmean:      110.0 cm/s   Systemic VTI:  0.20 m MV Decel Time: 93 msec      Systemic Diam: 2.20 cm MV E velocity: 128.00 cm/s MV A velocity: 117.00 cm/s MV E/A ratio:  1.09 Kathlyn Sacramento MD Electronically signed by Kathlyn Sacramento MD Signature Date/Time:  11/07/2020/2:30:00 PM    Final    DG Hip Unilat W or Wo Pelvis 2-3 Views Right  Result Date: 11/15/2020 CLINICAL DATA:  Right hip pain after fall 1 week ago EXAM: DG HIP (WITH OR WITHOUT PELVIS) 2-3V RIGHT COMPARISON:  None. FINDINGS: Frontal view of the pelvis as well as frontal and frogleg lateral views of the right hip are obtained. No fracture, subluxation, or dislocation. Mild symmetrical hip osteoarthritis. Moderate degenerative changes at the lumbosacral junction. IMPRESSION: 1. No acute bony abnormality. 2. Degenerative changes of the bilateral hips and lumbar spine. Electronically Signed   By: Randa Ngo M.D.   On: 11/18/2020 17:49    Microbiology Recent Results (from the past 240 hour(s))  Resp Panel by RT-PCR (Flu A&B, Covid) Nasopharyngeal Swab     Status: None   Collection Time: 11/06/2020 10:03 PM   Specimen: Nasopharyngeal Swab; Nasopharyngeal(NP) swabs in vial transport medium  Result Value Ref Range Status   SARS Coronavirus 2 by RT PCR NEGATIVE NEGATIVE Final    Comment: (NOTE) SARS-CoV-2 target nucleic acids are NOT DETECTED.  The SARS-CoV-2 RNA is generally detectable in upper respiratory specimens during the acute phase of infection. The lowest concentration of SARS-CoV-2 viral copies this assay can detect is 138 copies/mL. A negative result does not preclude SARS-Cov-2 infection and should not be used as the sole basis for treatment or other patient management decisions. A negative result may occur with  improper specimen collection/handling, submission of specimen other than nasopharyngeal swab, presence of viral mutation(s) within the areas targeted by this assay, and inadequate number of viral copies(<138 copies/mL). A negative result must be combined with clinical observations, patient history, and epidemiological information. The expected result is Negative.  Fact Sheet for Patients:  EntrepreneurPulse.com.au  Fact Sheet for Healthcare  Providers:  IncredibleEmployment.be  This test is no t yet approved or cleared by the [place] FDA and  has been authorized for detection and/or diagnosis of SARS-CoV-2 by FDA under an Emergency Use Authorization (EUA). This EUA will remain  in effect (meaning this test can be used) for the duration of the COVID-19 declaration under Section 564(b)(1) of the Act, 21 U.S.C.section 360bbb-3(b)(1), unless the authorization is terminated  or revoked sooner.       Influenza A by PCR NEGATIVE NEGATIVE Final   Influenza B by PCR NEGATIVE NEGATIVE Final    Comment: (NOTE) The Xpert Xpress SARS-CoV-2/FLU/RSV plus assay is intended as an aid in the diagnosis of influenza from Nasopharyngeal swab specimens and should not be used as a sole basis for treatment. Nasal washings and aspirates are unacceptable for Xpert Xpress SARS-CoV-2/FLU/RSV testing.  Fact Sheet for Patients: EntrepreneurPulse.com.au  Fact Sheet for Healthcare Providers: IncredibleEmployment.be  This test is not yet approved or cleared by the [place] FDA and has been authorized for detection and/or diagnosis of SARS-CoV-2 by FDA under an Emergency Use Authorization (EUA). This EUA will  remain in effect (meaning this test can be used) for the duration of the COVID-19 declaration under Section 564(b)(1) of the Act, 21 U.S.C. section 360bbb-3(b)(1), unless the authorization is terminated or revoked.  Performed at [facility], [address] [place], [place] [address]   CULTURE, BLOOD (ROUTINE X 2) w Reflex to ID Panel     Status: None   Collection Time: 11/07/20  6:29 AM   Specimen: BLOOD  Result Value Ref Range Status   Specimen Description BLOOD BLOOD RIGHT HAND  Final   Special Requests   Final    BOTTLES DRAWN AEROBIC AND ANAEROBIC Blood Culture results may not be optimal due to an inadequate volume of blood received in culture bottles    Culture   Final    NO GROWTH 5 DAYS Performed at [facility], [address] [place], [place] [address]    Report Status 12-08-2020 FINAL  Final  CULTURE, BLOOD (ROUTINE X 2) w Reflex to ID Panel     Status: None   Collection Time: 11/07/20  6:29 AM   Specimen: BLOOD  Result Value Ref Range Status   Specimen Description BLOOD FINGER  Final   Special Requests   Final    BOTTLES DRAWN AEROBIC AND ANAEROBIC Blood Culture adequate volume   Culture   Final    NO GROWTH 5 DAYS Performed at [facility], [address] [place], [place] [address]    Report Status 2020/12/08 FINAL  Final    Lab Basic Metabolic Panel: Recent Labs  Lab 11/08/20 0426  NA 145  K 3.6  CL 109  CO2 19*  GLUCOSE 177*  BUN 46*  CREATININE 2.06*  CALCIUM 8.5*  MG 2.2  PHOS 5.0*   Liver Function Tests: No results for input(s): AST, ALT, ALKPHOS, BILITOT, PROT, ALBUMIN in the last 168 hours. No results for input(s): LIPASE, AMYLASE in the last 168 hours. No results for input(s): AMMONIA in the last 168 hours. CBC: Recent Labs  Lab 11/08/20 0426  WBC 14.9*  NEUTROABS 13.1*  HGB 8.9*  HCT 26.5*  MCV 107.3*  PLT 332   Cardiac Enzymes: No results for input(s): CKTOTAL, CKMB, CKMBINDEX, TROPONINI in the last 168 hours. Sepsis Labs: Recent Labs  Lab 11/08/20 0426  WBC 14.9*    Procedures/Operations    Desma Maxim 11/14/2020, 5:11 PM

## 2020-12-03 NOTE — Progress Notes (Signed)
Change of shift, off going RN Cindie Laroche and Velna Hatchet pronounced at [phone].  Daughter, Geni Bers requested to see patient, will inform sister,

## 2020-12-03 NOTE — Progress Notes (Signed)
Patient transferred to [facility] by transport team

## 2020-12-03 NOTE — Progress Notes (Signed)
PROGRESS NOTE    David Perez  KLK:[id number] DOB: Oct 27, 1941 DOA: 11/18/2020 PCP: Marval Regal, NP   Chief complaint.  Shortness of breath. 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 He was sent from hematology clinic with hypotension. Discussed with patient daughter, patient had a fall about 5 days ago. He has generalized weakness. He occasionally confused. He has a poor p.o. intake, he has lost 20 pounds of body weight. He also has some loose stools. He currently lives alone at home. When I see the patient today, he appeared to have some confusion. Speech is slurred. But no weakness in the legs or arms. No facial droop. He had a CT scan of abdomen/pelvis in the emergency room, did not see any acute changes.Hb7.7. 12/5.MRI of the brain showed acute stroke in the right parietal lobe and the left cerebellar. 12/6.  Patient developed severe hypoxemia, lactic acidosis, patient is seen by pulmonology, started palliative care.   Assessment & Plan:   Principal Problem:   Hypotension due to hypovolemia Active Problems:   S/P CABG x 3   Debility   Acute on chronic systolic CHF (congestive heart failure) (HCC)   Macrocytic anemia   AKI (acute kidney injury) ([place])   Terminal care   Chronic anticoagulation   Weight loss   Protein malnutrition (HCC)   Hypotension   Failure to thrive in adult   Acute embolic stroke (HCC)   Moderate pulmonary hypertension ([place])   Anemia of chronic disease   Severe protein-calorie malnutrition [employer]: less than 60% of standard weight) (HCC)   AF (paroxysmal atrial fibrillation) (HCC)   Acute hypoxemic respiratory failure (HCC)   Aspiration pneumonia of both lower lobes due to gastric secretions (HCC)   Palliative care by specialist   Increased oropharyngeal secretions  End of life care Transitioned to comfort care, now  asleep, this morning with breathing more frequent and shallow. Seen by palliative 12/10 and daughter updated @ bedside. Unstable for transfer to inpt hospice so plan now is in-hospital death. On dilaudid gtt, appears comfortable.  Subjective: Asleep, not arousable.  Objective: Vitals:   11/11/20 1926 November 19, 2020 0020 Nov 19, 2020 0629 Nov 19, 2020 0804  BP: (!) 88/52   (!) 78/50  Pulse: 100   (!) 102  Resp: 16 18 (!) 21 (!) 23  Temp: 99.7 F (37.6 C)   98.7 F (37.1 C)  TempSrc:    Oral  SpO2: (!) 85%   (!) 86%  Weight:      Height:        Intake/Output Summary (Last 24 hours) at 11-19-20 1436 Last data filed at November 19, 2020 0900 Gross per 24 hour  Intake 0 ml  Output 350 ml  Net -350 ml   Filed Weights   11/08/20 0518  Weight: 59.4 kg    Examination:  General exam: Ill-appearing, cachectic, severe muscle atrophy. Respiratory system: Decreased breathing sounds. Shallow breathing. Cardiovascular system: Regular and tachycardic no JVD, murmurs, rubs, gallops or clicks. No pedal edema. Gastrointestinal system: Abdomen is nondistended, soft and nontender.  Central nervous system: asleep Extremities: Symmetric  Skin: No rashes, lesions or ulcers. warm     Data Reviewed: I have personally reviewed following labs and imaging studies  CBC: Recent Labs  Lab 11/06/20 0515 11/07/20 0303 11/08/20 0426  WBC 9.0 16.1* 14.9*  NEUTROABS 7.3 12.9* 13.1*  HGB 8.2* 9.1* 8.9*  HCT 24.3* 28.6* 26.5*  MCV 105.7* 112.2* 107.3*  PLT 278 335 409   Basic Metabolic Panel: Recent Labs  Lab 11/07/20 0303 11/08/20 0426  NA 137 145  K 4.4 3.6  CL 109 109  CO2 13* 19*  GLUCOSE 303* 177*  BUN 27* 46*  CREATININE 1.42* 2.06*  CALCIUM 8.2* 8.5*  MG 2.2 2.2  PHOS  --  5.0*   GFR: Estimated Creatinine Clearance: 23.6 mL/min (A) (by C-G formula based on SCr of 2.06 mg/dL (H)). Liver Function Tests: Recent Labs  Lab 11/07/20 0303  AST 42*  ALT 49*  ALKPHOS 67  BILITOT 1.3*  PROT  6.8  ALBUMIN 3.0*   No results for input(s): LIPASE, AMYLASE in the last 168 hours. No results for input(s): AMMONIA in the last 168 hours. Coagulation Profile: No results for input(s): INR, PROTIME in the last 168 hours. Cardiac Enzymes: No results for input(s): CKTOTAL, CKMB, CKMBINDEX, TROPONINI in the last 168 hours. BNP (last 3 results) No results for input(s): PROBNP in the last 8760 hours. HbA1C: No results for input(s): HGBA1C in the last 72 hours. CBG: Recent Labs  Lab 11/05/20 2347  GLUCAP 150*   Lipid Profile: No results for input(s): CHOL, HDL, LDLCALC, TRIG, CHOLHDL, LDLDIRECT in the last 72 hours. Thyroid Function Tests: No results for input(s): TSH, T4TOTAL, FREET4, T3FREE, THYROIDAB in the last 72 hours. Anemia Panel: No results for input(s): VITAMINB12, FOLATE, FERRITIN, TIBC, IRON, RETICCTPCT in the last 72 hours. Sepsis Labs: Recent Labs  Lab 11/06/20 2342 11/07/20 0629 11/07/20 0746  PROCALCITON 2.79  --   --   LATICACIDVEN  --  7.6* 6.8*    Recent Results (from the past 240 hour(s))  Resp Panel by RT-PCR (Flu A&B, Covid) Nasopharyngeal Swab     Status: None   Collection Time: 12/01/2020 10:03 PM   Specimen: Nasopharyngeal Swab; Nasopharyngeal(NP) swabs in vial transport medium  Result Value Ref Range Status   SARS Coronavirus 2 by RT PCR NEGATIVE NEGATIVE Final    Comment: (NOTE) SARS-CoV-2 target nucleic acids are NOT DETECTED.  The SARS-CoV-2 RNA is generally detectable in upper respiratory specimens during the acute phase of infection. The lowest concentration of SARS-CoV-2 viral copies this assay can detect is 138 copies/mL. A negative result does not preclude SARS-Cov-2 infection and should not be used as the sole basis for treatment or other patient management decisions. A negative result may occur with  improper specimen collection/handling, submission of specimen other than nasopharyngeal swab, presence of viral mutation(s) within  the areas targeted by this assay, and inadequate number of viral copies(<138 copies/mL). A negative result must be combined with clinical observations, patient history, and epidemiological information. The expected result is Negative.  Fact Sheet for Patients:  EntrepreneurPulse.com.au  Fact Sheet for Healthcare Providers:  IncredibleEmployment.be  This test is no t yet approved or cleared by the [place] FDA and  has been authorized for detection and/or diagnosis of SARS-CoV-2 by FDA under an Emergency Use Authorization (EUA). This EUA will remain  in effect (meaning this test can be used) for the duration of the COVID-19 declaration under Section 564(b)(1) of the Act, 21 U.S.C.section 360bbb-3(b)(1), unless the authorization is terminated  or revoked sooner.       Influenza A by PCR NEGATIVE NEGATIVE Final   Influenza B by PCR NEGATIVE NEGATIVE Final    Comment: (NOTE) The Xpert Xpress SARS-CoV-2/FLU/RSV plus assay is intended as an aid in the diagnosis of influenza from Nasopharyngeal swab specimens and should not be used as a sole basis for  treatment. Nasal washings and aspirates are unacceptable for Xpert Xpress SARS-CoV-2/FLU/RSV testing.  Fact Sheet for Patients: EntrepreneurPulse.com.au  Fact Sheet for Healthcare Providers: IncredibleEmployment.be  This test is not yet approved or cleared by the [place] FDA and has been authorized for detection and/or diagnosis of SARS-CoV-2 by FDA under an Emergency Use Authorization (EUA). This EUA will remain in effect (meaning this test can be used) for the duration of the COVID-19 declaration under Section 564(b)(1) of the Act, 21 U.S.C. section 360bbb-3(b)(1), unless the authorization is terminated or revoked.  Performed at [facility], [address] [place], [place] [address]   CULTURE, BLOOD (ROUTINE X 2) w Reflex to ID Panel      Status: None   Collection Time: 11/07/20  6:29 AM   Specimen: BLOOD  Result Value Ref Range Status   Specimen Description BLOOD BLOOD RIGHT HAND  Final   Special Requests   Final    BOTTLES DRAWN AEROBIC AND ANAEROBIC Blood Culture results may not be optimal due to an inadequate volume of blood received in culture bottles   Culture   Final    NO GROWTH 5 DAYS Performed at [facility], [address] [place], [place] [address]    Report Status December 05, 2020 FINAL  Final  CULTURE, BLOOD (ROUTINE X 2) w Reflex to ID Panel     Status: None   Collection Time: 11/07/20  6:29 AM   Specimen: BLOOD  Result Value Ref Range Status   Specimen Description BLOOD FINGER  Final   Special Requests   Final    BOTTLES DRAWN AEROBIC AND ANAEROBIC Blood Culture adequate volume   Culture   Final    NO GROWTH 5 DAYS Performed at [facility], [address] [place],  [address]    Report Status 12/05/2020 FINAL  Final         Radiology Studies: No results found.      Scheduled Meds: . scopolamine  1 patch Transdermal Q72H   Continuous Infusions: . HYDROmorphone 0.5 mg/hr (11/10/20 1221)     LOS: 7 days    Time spent: 15 minutes    Desma Maxim, MD [facility]   To contact the attending provider between 7A-7P or the covering provider during after hours 7P-7A, please log into the web site [email or web address] and access using universal [place] password for that web site. If you do not have the password, please call the hospital operator.  12-05-20, 2:36 PM

## 2020-12-03 DEATH — deceased

## 2020-12-08 ENCOUNTER — Encounter (HOSPITAL_COMMUNITY): Payer: Medicare Other | Admitting: Cardiology

## 2021-01-03 IMAGING — DX DG CHEST 1V PORT
1 series · 1 of 1 positions shown · non-contrast
Comparison: Radiograph 12/31/2019

CLINICAL DATA: Post aortic valve replacement and left heart
catheterization 12/31/2019 with CABG

EXAM:
PORTABLE CHEST 1 VIEW

[chest ap]
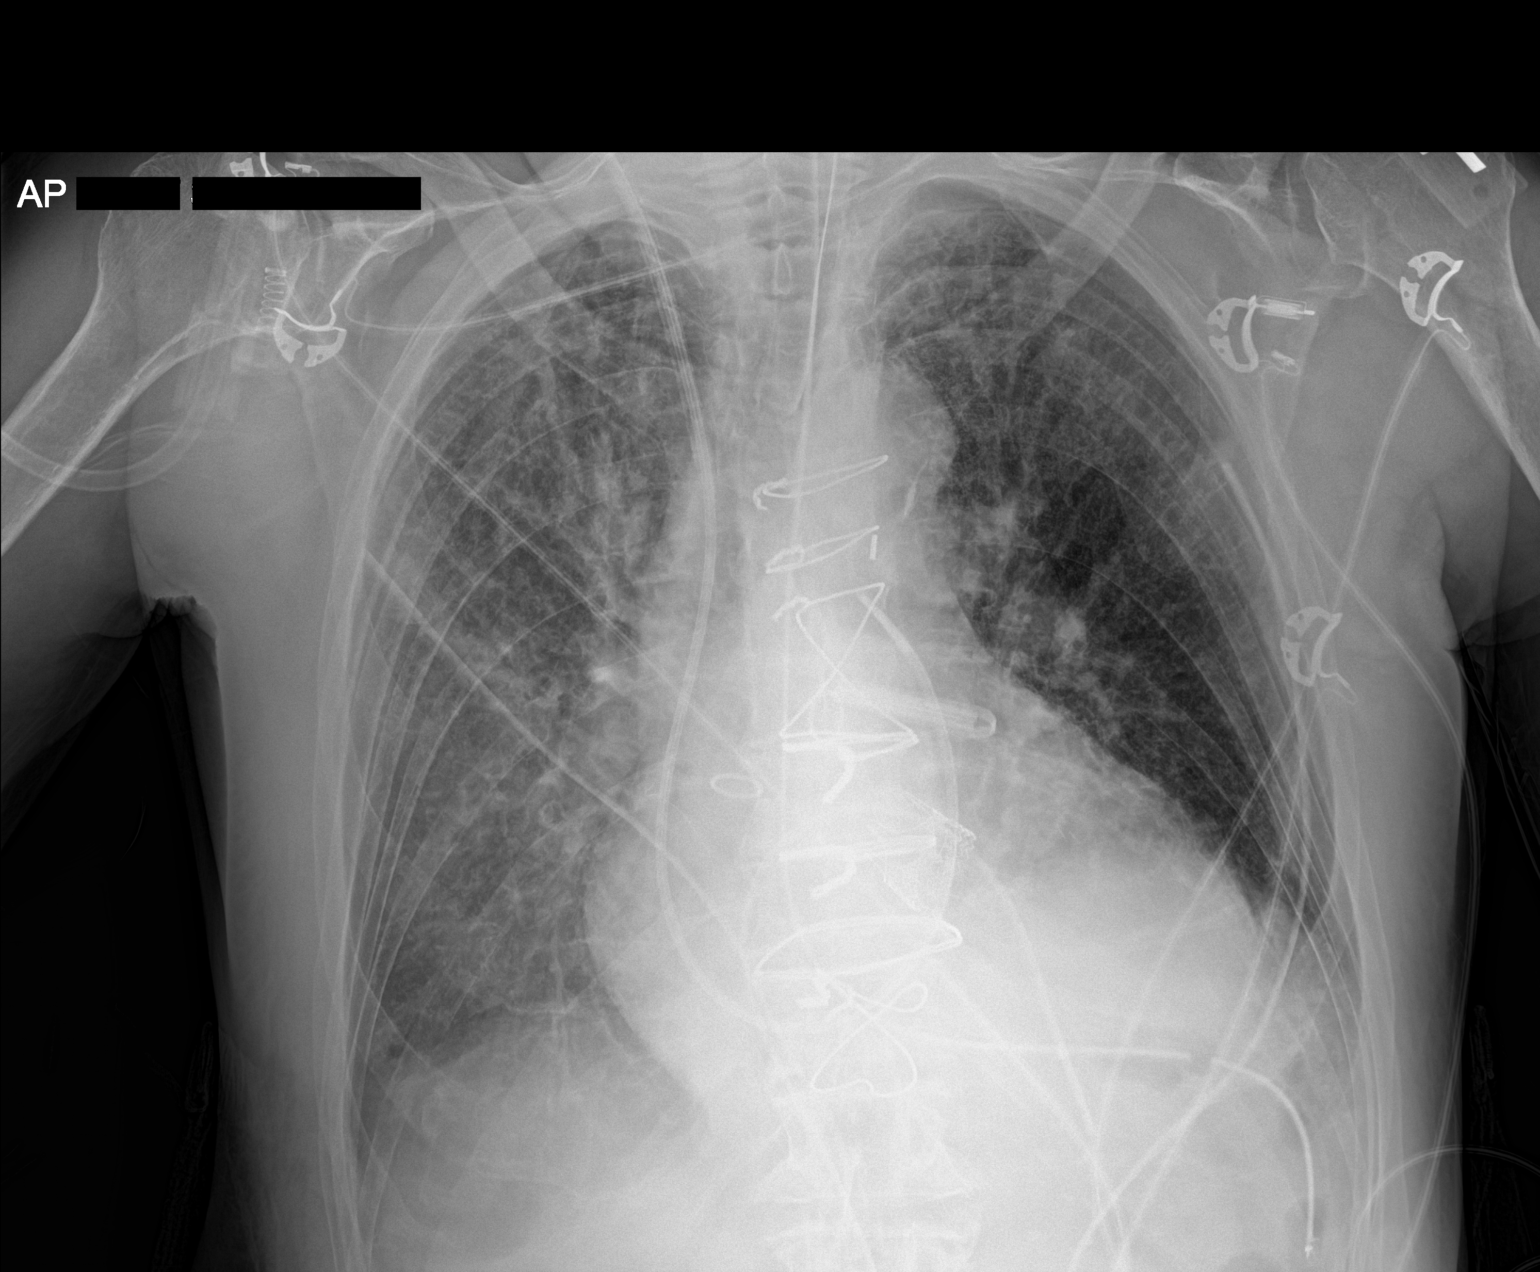

[1 of 1 positions shown; findings below may reference images not displayed]

FINDINGS: Right IJ catheter sheath through which passes a Swan-Ganz catheter
terminating at the level of the main pulmonary artery. A
transesophageal tube tip terminates in the left upper quadrant with
the tip beyond the GE junction. Endotracheal tube tip terminates in
the lower trachea, 4 cm from the carina. There are postsurgical
changes from sternotomy with aortic valve replacement and multiple
mediastinal clips. Atrial occlusion device is noted as well.
Cardiomediastinal contours are stable from 1 day prior with a
calcified aorta.

There is been some increased interstitial opacity particularly
within the right lung base when compared to prior study. No visible
pneumothorax. Suspect at least trace bilateral effusions. No acute
osseous or soft tissue abnormality. Degenerative changes are present
in the imaged spine and shoulders.
IMPRESSION: 1. Increased interstitial opacity particularly within the right lung
base, which may represent worsening pulmonary edema.
2. Support lines and tubes as described above.
3. Stable cardiomediastinal contours post CABG and aortic valve
replacement.

## 2021-01-04 IMAGING — DX DG CHEST 1V PORT
1 series · 1 of 1 positions shown · non-contrast
Comparison: Chest radiograph 01/02/2020

CLINICAL DATA: Central line placement

EXAM:
PORTABLE CHEST 1 VIEW

[chest]
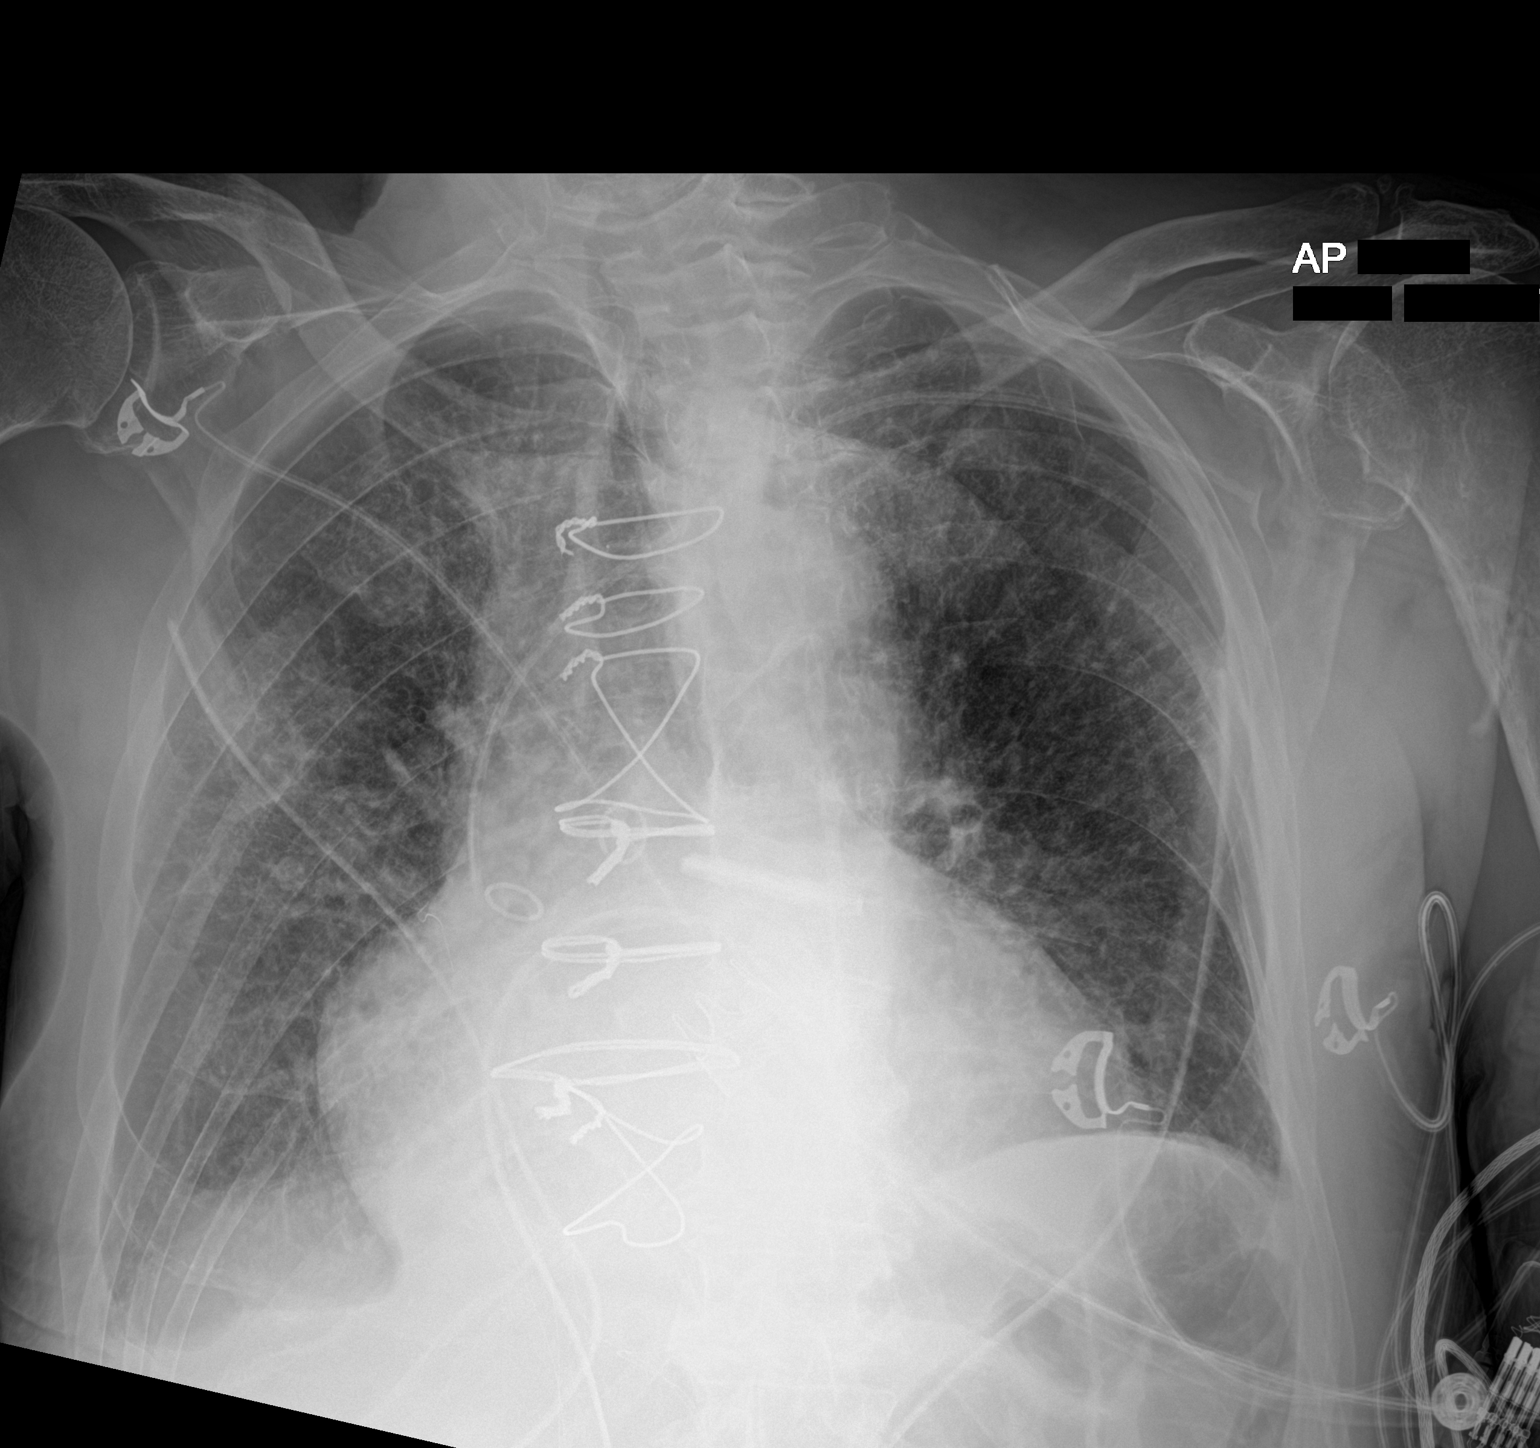

[1 of 1 positions shown; findings below may reference images not displayed]

FINDINGS: Monitoring leads overlie the patient. Bilateral chest tubes are in
place. Interval insertion left subclavian central venous catheter
with tip projecting over the superior vena cava. Interval removal
right IJ sheath and PA catheter. Similar-appearing patchy areas of
consolidation. Small right pleural effusion. No definite
pneumothorax.
IMPRESSION: Interval insertion left subclavian central venous catheter with tip
projecting over the superior vena cava.

Similar bilateral opacities which may represent edema.

Small right pleural effusion.

Interval removal PA catheter and right IJ sheath.

## 2021-01-04 IMAGING — DX DG CHEST 1V PORT
1 series · 1 of 1 positions shown · non-contrast
Comparison: Chest radiograph 01/01/2020.

CLINICAL DATA: Patient status post open heart surgery.

EXAM:
PORTABLE CHEST 1 VIEW

[chest]
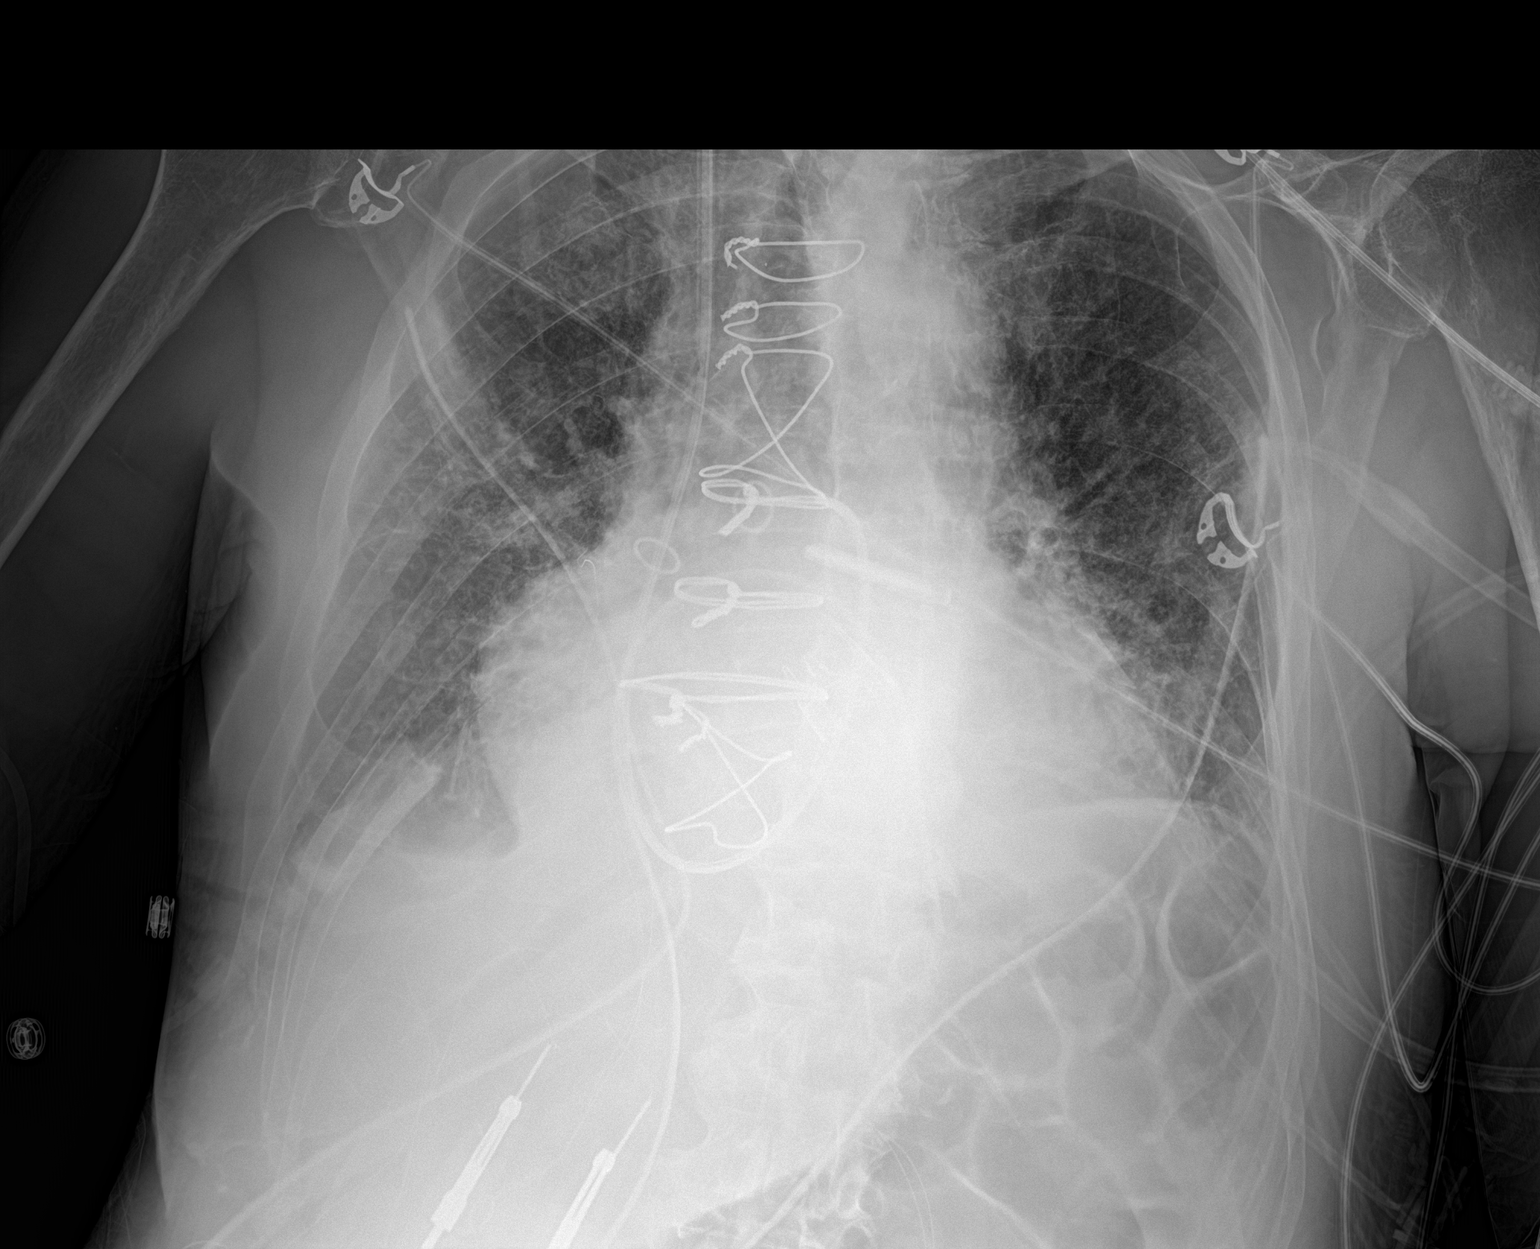

[1 of 1 positions shown; findings below may reference images not displayed]

FINDINGS: Right IJ approach pulmonary arterial catheter with tip projecting
over the central pulmonary artery. Right chest tube in place.
Monitoring leads overlie the patient. Interval extubation and
removal of enteric tube. Left chest tube in place. Stable cardiac
and mediastinal contours. Similar bilateral interstitial opacities.
No definite pneumothorax.
IMPRESSION: Similar interstitial opacities which may represent edema. Small
right pleural effusion.

Interval extubation and removal of enteric tube.

Remaining support apparatus as above.

## 2021-01-05 IMAGING — DX DG CHEST 1V PORT
1 series · 1 of 1 positions shown · non-contrast
Comparison: Chest radiograph 01/02/2020

CLINICAL DATA: Follow-up exam.

EXAM:
PORTABLE CHEST 1 VIEW

[chest ap]
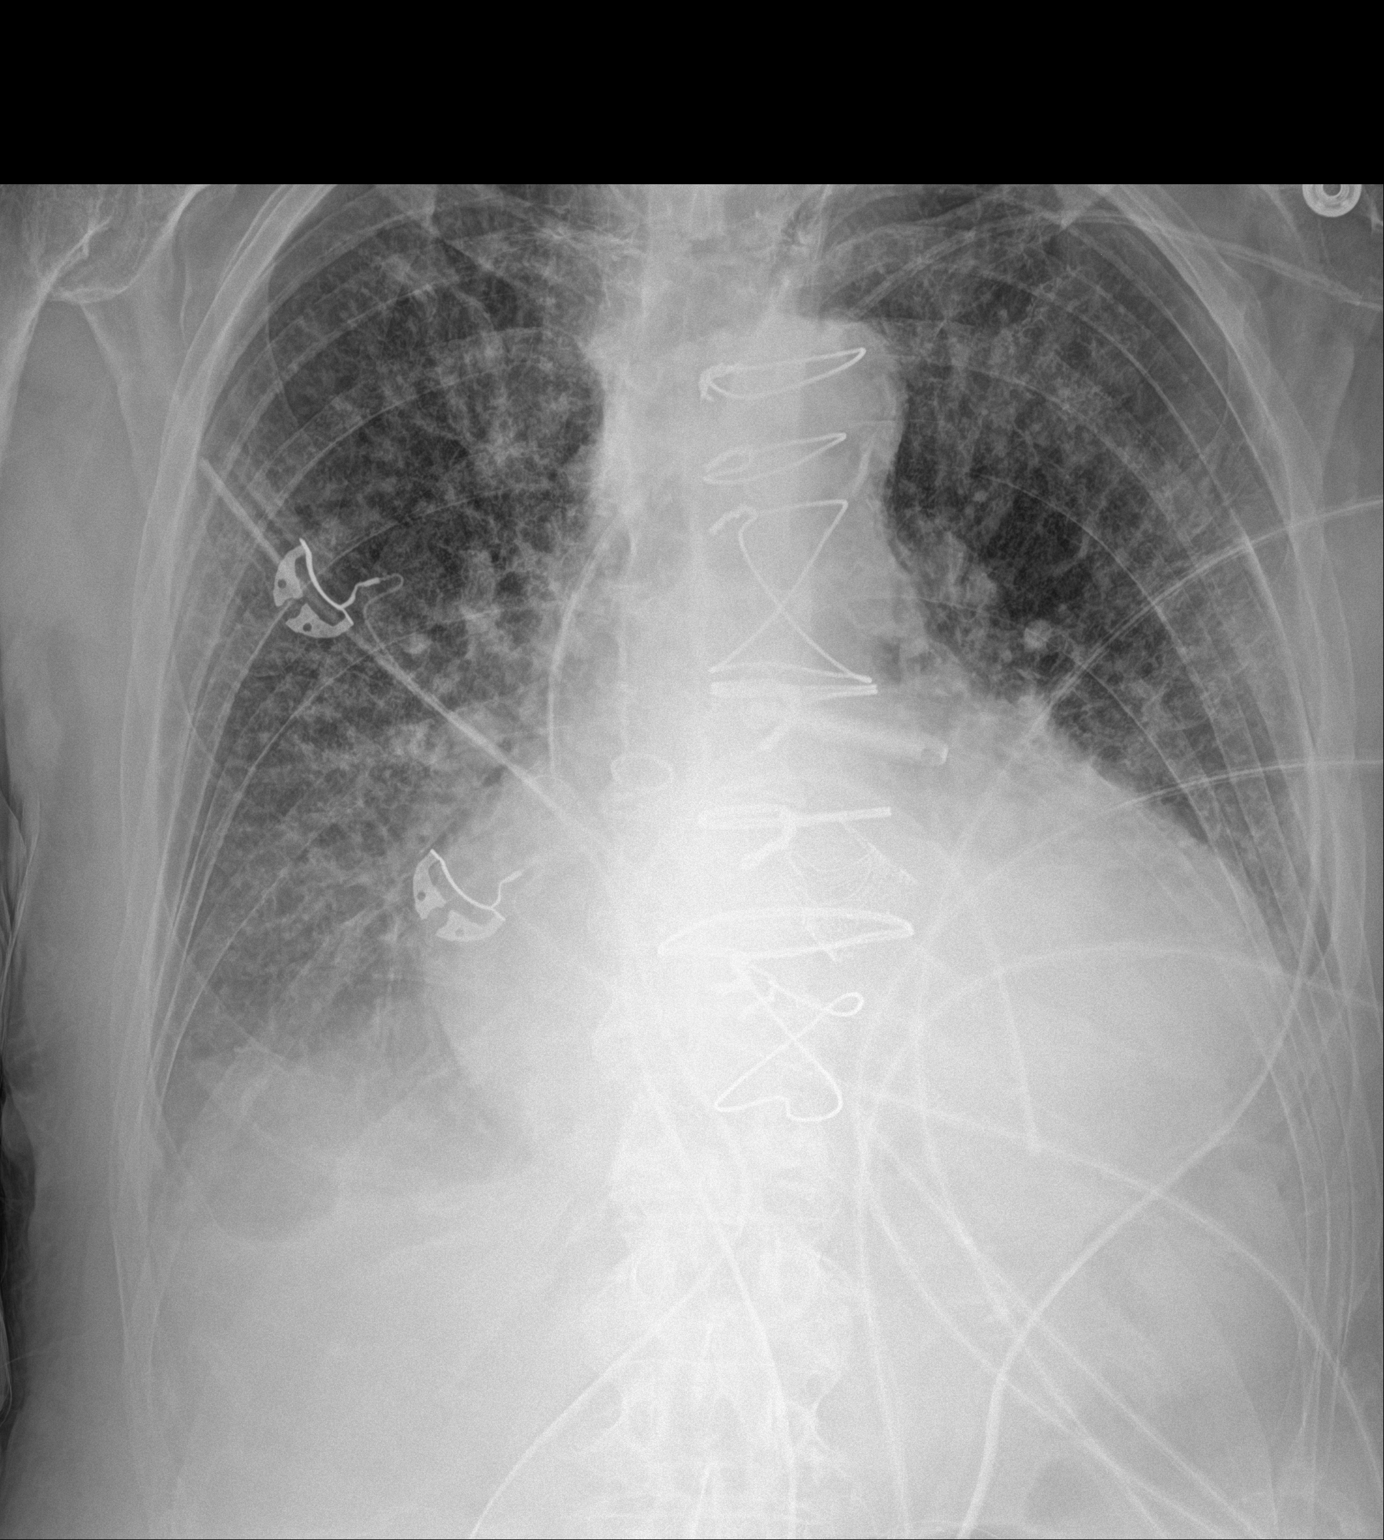

[1 of 1 positions shown; findings below may reference images not displayed]

FINDINGS: Left upper extremity central venous catheter tip projects over the
superior vena cava. Bilateral chest tubes in place. Monitoring leads
overlie the patient. Stable cardiomegaly status post median
sternotomy. Persistent small right pleural effusion with underlying
consolidation. New small left pleural effusion and retrocardiac
consolidation. Increasing bilateral interstitial pulmonary
opacities. No pneumothorax.
IMPRESSION: Increasing bilateral interstitial opacities may represent pulmonary
edema.

New small left effusion and retrocardiac consolidation.

Persistent small right pleural effusion and underlying
consolidation.

Support apparatus as above.

## 2021-01-07 IMAGING — DX DG CHEST 1V PORT
1 series · 1 of 1 positions shown · non-contrast
Comparison: 01/04/2020

CLINICAL DATA: Recent heart surgery.

EXAM:
PORTABLE CHEST 1 VIEW

[chest]
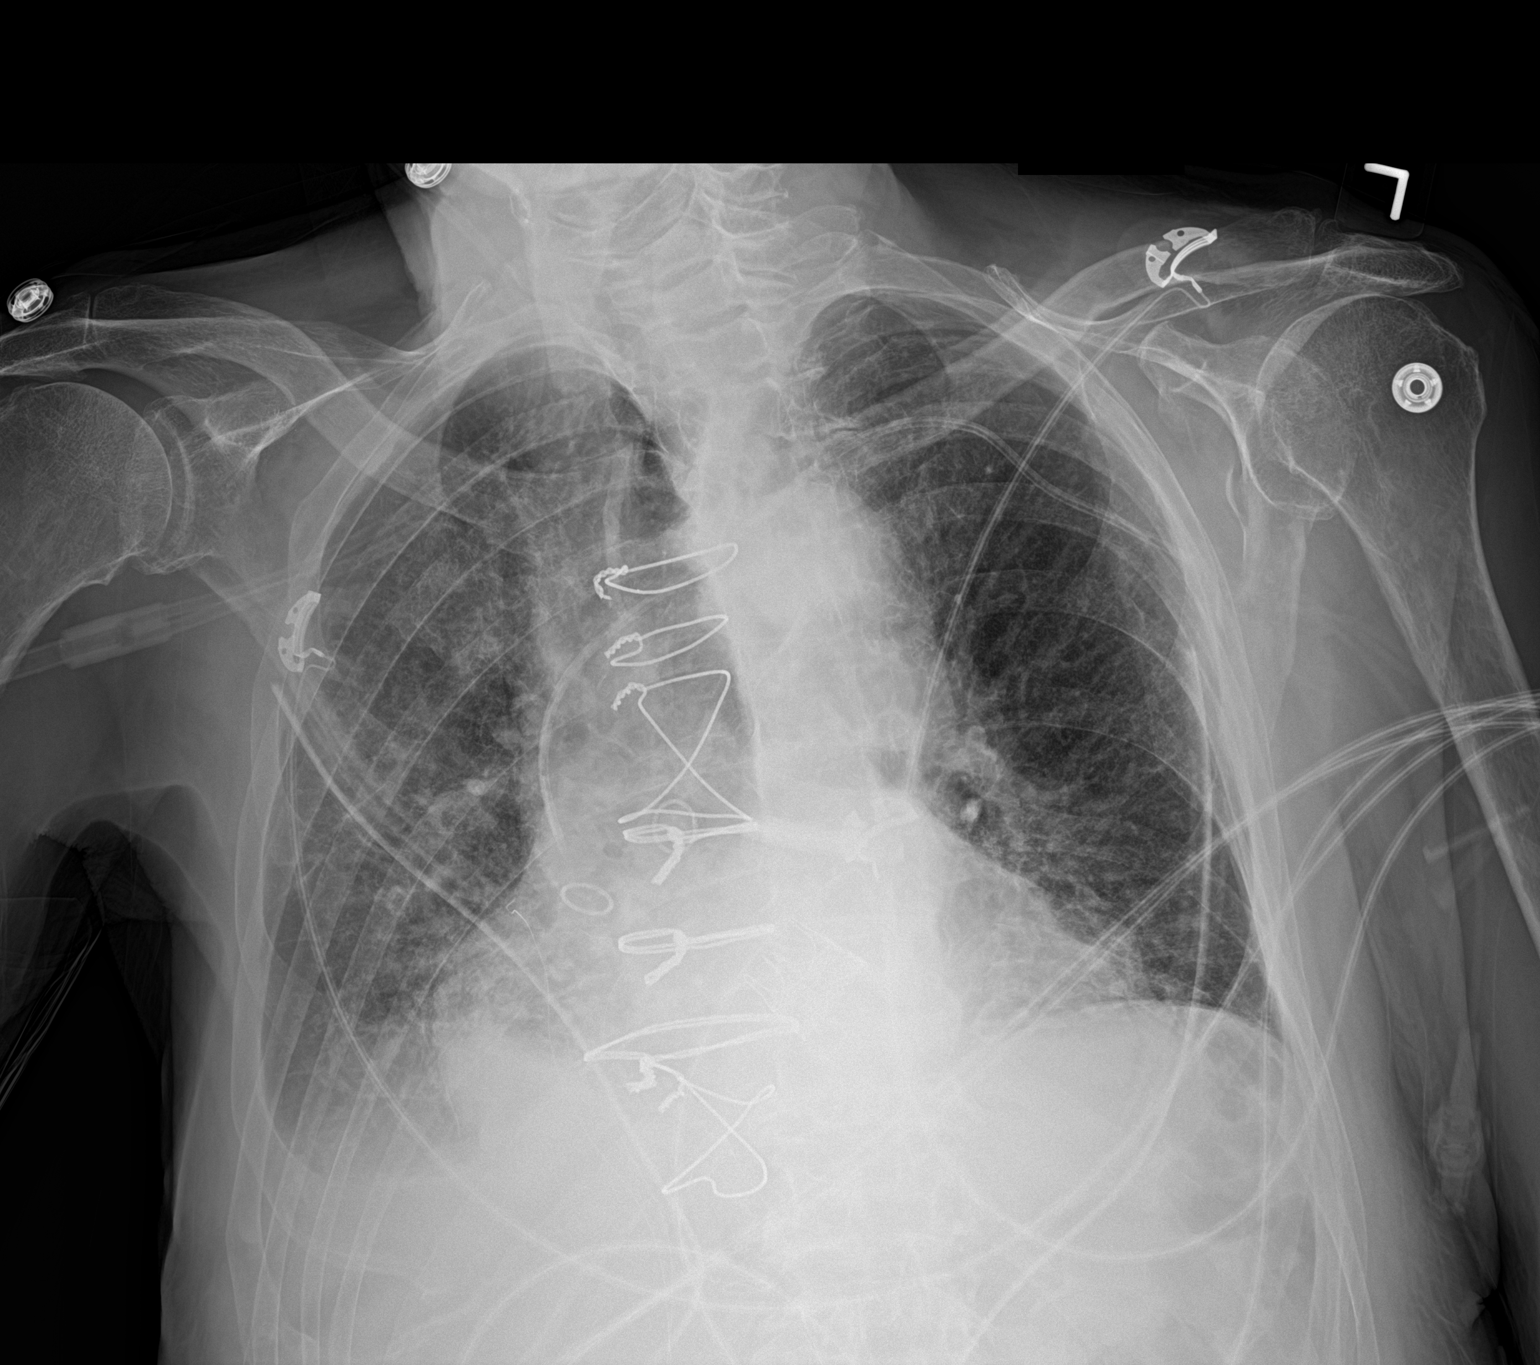

[1 of 1 positions shown; findings below may reference images not displayed]

FINDINGS: Bilateral chest tubes are stable. No pneumothorax.

The left subclavian central venous catheter is stable.

Stable mild cardiac enlargement. Persistent small right pleural
effusion bibasilar atelectasis but overall slight improved lung
aeration.
IMPRESSION: 1. Stable bilateral chest tubes.  No pneumothorax.
2. Slight improved lung aeration.

## 2021-06-30 ENCOUNTER — Ambulatory Visit: Payer: Medicare Other
